# Patient Record
Sex: Male | Born: 1940
Health system: Southern US, Community
[De-identification: ages and names within clinical notes are randomized; demographics above are authoritative.]

## PROBLEM LIST (undated history)

## (undated) DIAGNOSIS — R011 Cardiac murmur, unspecified: Secondary | ICD-10-CM

## (undated) DIAGNOSIS — K219 Gastro-esophageal reflux disease without esophagitis: Secondary | ICD-10-CM

## (undated) DIAGNOSIS — K5790 Diverticulosis of intestine, part unspecified, without perforation or abscess without bleeding: Secondary | ICD-10-CM

## (undated) DIAGNOSIS — Z8489 Family history of other specified conditions: Secondary | ICD-10-CM

## (undated) DIAGNOSIS — Z87442 Personal history of urinary calculi: Secondary | ICD-10-CM

## (undated) DIAGNOSIS — J449 Chronic obstructive pulmonary disease, unspecified: Secondary | ICD-10-CM

## (undated) DIAGNOSIS — E785 Hyperlipidemia, unspecified: Secondary | ICD-10-CM

## (undated) DIAGNOSIS — I700 Atherosclerosis of aorta: Secondary | ICD-10-CM

## (undated) DIAGNOSIS — I70219 Atherosclerosis of native arteries of extremities with intermittent claudication, unspecified extremity: Secondary | ICD-10-CM

## (undated) DIAGNOSIS — I82409 Acute embolism and thrombosis of unspecified deep veins of unspecified lower extremity: Secondary | ICD-10-CM

## (undated) DIAGNOSIS — C44310 Basal cell carcinoma of skin of unspecified parts of face: Secondary | ICD-10-CM

## (undated) DIAGNOSIS — C4491 Basal cell carcinoma of skin, unspecified: Secondary | ICD-10-CM

## (undated) DIAGNOSIS — I4891 Unspecified atrial fibrillation: Secondary | ICD-10-CM

## (undated) DIAGNOSIS — M1990 Unspecified osteoarthritis, unspecified site: Secondary | ICD-10-CM

## (undated) DIAGNOSIS — N400 Enlarged prostate without lower urinary tract symptoms: Secondary | ICD-10-CM

## (undated) DIAGNOSIS — H409 Unspecified glaucoma: Secondary | ICD-10-CM

## (undated) DIAGNOSIS — I739 Peripheral vascular disease, unspecified: Secondary | ICD-10-CM

## (undated) DIAGNOSIS — I6529 Occlusion and stenosis of unspecified carotid artery: Secondary | ICD-10-CM

## (undated) DIAGNOSIS — Z8679 Personal history of other diseases of the circulatory system: Secondary | ICD-10-CM

## (undated) DIAGNOSIS — I2510 Atherosclerotic heart disease of native coronary artery without angina pectoris: Secondary | ICD-10-CM

## (undated) DIAGNOSIS — I499 Cardiac arrhythmia, unspecified: Secondary | ICD-10-CM

## (undated) DIAGNOSIS — N183 Chronic kidney disease, stage 3 unspecified: Secondary | ICD-10-CM

## (undated) HISTORY — PX: EYE SURGERY: SHX253

## (undated) HISTORY — PX: ABDOMINAL AORTIC ANEURYSM REPAIR: SUR1152

## (undated) HISTORY — PX: TONSILLECTOMY: SUR1361

## (undated) HISTORY — PX: VITRECTOMY: SHX106

## (undated) HISTORY — DX: Hyperlipidemia, unspecified: E78.5

## (undated) HISTORY — PX: ROTATOR CUFF REPAIR: SHX139

---

## 1983-07-22 DIAGNOSIS — I219 Acute myocardial infarction, unspecified: Secondary | ICD-10-CM

## 1983-07-22 HISTORY — DX: Acute myocardial infarction, unspecified: I21.9

## 1983-08-14 DIAGNOSIS — I2510 Atherosclerotic heart disease of native coronary artery without angina pectoris: Secondary | ICD-10-CM

## 1983-08-14 HISTORY — PX: CARDIAC CATHETERIZATION: SHX172

## 1983-08-14 HISTORY — PX: TEMPORARY PACEMAKER: CATH118268

## 1983-08-14 HISTORY — PX: CORONARY ANGIOPLASTY: SHX604

## 1983-08-14 HISTORY — DX: Atherosclerotic heart disease of native coronary artery without angina pectoris: I25.10

## 2001-02-11 ENCOUNTER — Encounter: Payer: Self-pay | Admitting: Family Medicine

## 2001-02-11 ENCOUNTER — Encounter: Admission: RE | Admit: 2001-02-11 | Discharge: 2001-02-11 | Payer: Self-pay | Admitting: Family Medicine

## 2002-10-20 ENCOUNTER — Encounter: Admission: RE | Admit: 2002-10-20 | Discharge: 2002-10-20 | Payer: Self-pay | Admitting: Family Medicine

## 2002-10-20 ENCOUNTER — Encounter: Payer: Self-pay | Admitting: Family Medicine

## 2003-01-11 ENCOUNTER — Encounter: Payer: Self-pay | Admitting: Family Medicine

## 2003-01-11 ENCOUNTER — Encounter: Admission: RE | Admit: 2003-01-11 | Discharge: 2003-01-11 | Payer: Self-pay | Admitting: Family Medicine

## 2006-05-21 DIAGNOSIS — I6529 Occlusion and stenosis of unspecified carotid artery: Secondary | ICD-10-CM

## 2006-05-21 HISTORY — DX: Occlusion and stenosis of unspecified carotid artery: I65.29

## 2006-05-21 HISTORY — PX: OTHER SURGICAL HISTORY: SHX169

## 2007-08-26 ENCOUNTER — Encounter: Admission: RE | Admit: 2007-08-26 | Discharge: 2007-08-26 | Payer: Self-pay | Admitting: Occupational Medicine

## 2011-01-23 HISTORY — PX: CARDIOVASCULAR STRESS TEST: SHX262

## 2012-07-01 ENCOUNTER — Other Ambulatory Visit (HOSPITAL_COMMUNITY): Payer: Self-pay | Admitting: Cardiology

## 2012-07-01 DIAGNOSIS — R011 Cardiac murmur, unspecified: Secondary | ICD-10-CM

## 2012-07-01 DIAGNOSIS — R0989 Other specified symptoms and signs involving the circulatory and respiratory systems: Secondary | ICD-10-CM

## 2012-07-21 ENCOUNTER — Ambulatory Visit (HOSPITAL_COMMUNITY)
Admission: RE | Admit: 2012-07-21 | Discharge: 2012-07-21 | Disposition: A | Discharge status: Home / Self Care | Payer: Medicare HMO | Source: Ambulatory Visit | Attending: Cardiology | Admitting: Cardiology

## 2012-07-21 DIAGNOSIS — R011 Cardiac murmur, unspecified: Secondary | ICD-10-CM | POA: Insufficient documentation

## 2012-07-21 DIAGNOSIS — R0989 Other specified symptoms and signs involving the circulatory and respiratory systems: Secondary | ICD-10-CM | POA: Insufficient documentation

## 2012-07-21 DIAGNOSIS — I350 Nonrheumatic aortic (valve) stenosis: Secondary | ICD-10-CM

## 2012-07-21 HISTORY — PX: DOPPLER ECHOCARDIOGRAPHY: SHX263

## 2012-07-21 HISTORY — DX: Nonrheumatic aortic (valve) stenosis: I35.0

## 2012-07-21 NOTE — Progress Notes (Signed)
2D Echo Performed 07/21/2012    Garrett Moore, RCS  

## 2012-07-25 NOTE — Progress Notes (Signed)
Carotid Duplex Completed. 

## 2012-08-09 ENCOUNTER — Telehealth: Payer: Self-pay | Admitting: *Deleted

## 2012-08-09 NOTE — Telephone Encounter (Signed)
Left message to call back  In regards to Echo per Dr Efrain Sella Aortic stenosis causing the murmur

## 2012-08-16 NOTE — Telephone Encounter (Signed)
Spoke to patient. Result given 

## 2014-08-10 DIAGNOSIS — H524 Presbyopia: Secondary | ICD-10-CM | POA: Diagnosis not present

## 2014-08-10 DIAGNOSIS — H40013 Open angle with borderline findings, low risk, bilateral: Secondary | ICD-10-CM | POA: Diagnosis not present

## 2014-10-25 ENCOUNTER — Encounter: Payer: Self-pay | Admitting: Cardiology

## 2014-12-13 DIAGNOSIS — Z79899 Other long term (current) drug therapy: Secondary | ICD-10-CM | POA: Diagnosis not present

## 2014-12-13 DIAGNOSIS — E782 Mixed hyperlipidemia: Secondary | ICD-10-CM | POA: Diagnosis not present

## 2014-12-13 DIAGNOSIS — I2510 Atherosclerotic heart disease of native coronary artery without angina pectoris: Secondary | ICD-10-CM | POA: Diagnosis not present

## 2014-12-17 DIAGNOSIS — Z0000 Encounter for general adult medical examination without abnormal findings: Secondary | ICD-10-CM | POA: Diagnosis not present

## 2015-01-11 DIAGNOSIS — L820 Inflamed seborrheic keratosis: Secondary | ICD-10-CM | POA: Diagnosis not present

## 2015-05-19 DIAGNOSIS — R05 Cough: Secondary | ICD-10-CM | POA: Diagnosis not present

## 2015-11-14 DIAGNOSIS — H40013 Open angle with borderline findings, low risk, bilateral: Secondary | ICD-10-CM | POA: Diagnosis not present

## 2015-11-14 DIAGNOSIS — H02833 Dermatochalasis of right eye, unspecified eyelid: Secondary | ICD-10-CM | POA: Diagnosis not present

## 2015-11-14 DIAGNOSIS — H31013 Macula scars of posterior pole (postinflammatory) (post-traumatic), bilateral: Secondary | ICD-10-CM | POA: Diagnosis not present

## 2015-11-14 DIAGNOSIS — H1851 Endothelial corneal dystrophy: Secondary | ICD-10-CM | POA: Diagnosis not present

## 2015-12-26 DIAGNOSIS — E782 Mixed hyperlipidemia: Secondary | ICD-10-CM | POA: Diagnosis not present

## 2015-12-26 DIAGNOSIS — Z79899 Other long term (current) drug therapy: Secondary | ICD-10-CM | POA: Diagnosis not present

## 2015-12-26 DIAGNOSIS — Z0000 Encounter for general adult medical examination without abnormal findings: Secondary | ICD-10-CM | POA: Diagnosis not present

## 2016-05-28 DIAGNOSIS — H40013 Open angle with borderline findings, low risk, bilateral: Secondary | ICD-10-CM | POA: Diagnosis not present

## 2016-05-28 DIAGNOSIS — H524 Presbyopia: Secondary | ICD-10-CM | POA: Diagnosis not present

## 2016-05-29 DIAGNOSIS — H5203 Hypermetropia, bilateral: Secondary | ICD-10-CM | POA: Diagnosis not present

## 2016-05-29 DIAGNOSIS — H5213 Myopia, bilateral: Secondary | ICD-10-CM | POA: Diagnosis not present

## 2016-05-29 DIAGNOSIS — H52209 Unspecified astigmatism, unspecified eye: Secondary | ICD-10-CM | POA: Diagnosis not present

## 2016-07-20 DIAGNOSIS — H532 Diplopia: Secondary | ICD-10-CM | POA: Diagnosis not present

## 2016-07-20 DIAGNOSIS — H43392 Other vitreous opacities, left eye: Secondary | ICD-10-CM | POA: Diagnosis not present

## 2016-07-20 DIAGNOSIS — T8522XA Displacement of intraocular lens, initial encounter: Secondary | ICD-10-CM | POA: Diagnosis not present

## 2016-08-12 DIAGNOSIS — Z9861 Coronary angioplasty status: Secondary | ICD-10-CM | POA: Diagnosis not present

## 2016-08-12 DIAGNOSIS — Z9842 Cataract extraction status, left eye: Secondary | ICD-10-CM | POA: Diagnosis not present

## 2016-08-12 DIAGNOSIS — Z7982 Long term (current) use of aspirin: Secondary | ICD-10-CM | POA: Diagnosis not present

## 2016-08-12 DIAGNOSIS — I2510 Atherosclerotic heart disease of native coronary artery without angina pectoris: Secondary | ICD-10-CM | POA: Diagnosis not present

## 2016-08-12 DIAGNOSIS — Z9841 Cataract extraction status, right eye: Secondary | ICD-10-CM | POA: Diagnosis not present

## 2016-08-12 DIAGNOSIS — T8522XA Displacement of intraocular lens, initial encounter: Secondary | ICD-10-CM | POA: Diagnosis not present

## 2016-08-12 DIAGNOSIS — E7800 Pure hypercholesterolemia, unspecified: Secondary | ICD-10-CM | POA: Diagnosis not present

## 2016-08-12 DIAGNOSIS — T8522XS Displacement of intraocular lens, sequela: Secondary | ICD-10-CM | POA: Diagnosis not present

## 2016-08-12 DIAGNOSIS — Z961 Presence of intraocular lens: Secondary | ICD-10-CM | POA: Diagnosis not present

## 2016-08-12 DIAGNOSIS — T8522XD Displacement of intraocular lens, subsequent encounter: Secondary | ICD-10-CM | POA: Diagnosis not present

## 2016-08-12 DIAGNOSIS — I252 Old myocardial infarction: Secondary | ICD-10-CM | POA: Diagnosis not present

## 2016-12-11 DIAGNOSIS — H338 Other retinal detachments: Secondary | ICD-10-CM | POA: Diagnosis not present

## 2016-12-21 DIAGNOSIS — H31092 Other chorioretinal scars, left eye: Secondary | ICD-10-CM | POA: Diagnosis not present

## 2016-12-21 DIAGNOSIS — H35032 Hypertensive retinopathy, left eye: Secondary | ICD-10-CM | POA: Diagnosis not present

## 2017-01-29 DIAGNOSIS — E782 Mixed hyperlipidemia: Secondary | ICD-10-CM | POA: Diagnosis not present

## 2017-01-29 DIAGNOSIS — C4491 Basal cell carcinoma of skin, unspecified: Secondary | ICD-10-CM | POA: Diagnosis not present

## 2017-01-29 DIAGNOSIS — Z0000 Encounter for general adult medical examination without abnormal findings: Secondary | ICD-10-CM | POA: Diagnosis not present

## 2017-01-29 DIAGNOSIS — Z79899 Other long term (current) drug therapy: Secondary | ICD-10-CM | POA: Diagnosis not present

## 2017-02-10 DIAGNOSIS — C44319 Basal cell carcinoma of skin of other parts of face: Secondary | ICD-10-CM | POA: Diagnosis not present

## 2017-04-28 DIAGNOSIS — C44319 Basal cell carcinoma of skin of other parts of face: Secondary | ICD-10-CM | POA: Diagnosis not present

## 2017-11-12 DIAGNOSIS — M25552 Pain in left hip: Secondary | ICD-10-CM | POA: Diagnosis not present

## 2017-11-15 DIAGNOSIS — N183 Chronic kidney disease, stage 3 (moderate): Secondary | ICD-10-CM | POA: Diagnosis not present

## 2017-11-15 DIAGNOSIS — I2510 Atherosclerotic heart disease of native coronary artery without angina pectoris: Secondary | ICD-10-CM | POA: Diagnosis not present

## 2017-11-25 DIAGNOSIS — M25552 Pain in left hip: Secondary | ICD-10-CM | POA: Diagnosis not present

## 2017-11-25 DIAGNOSIS — M7062 Trochanteric bursitis, left hip: Secondary | ICD-10-CM | POA: Diagnosis not present

## 2018-01-21 DIAGNOSIS — I5189 Other ill-defined heart diseases: Secondary | ICD-10-CM

## 2018-01-21 HISTORY — DX: Other ill-defined heart diseases: I51.89

## 2018-02-01 DIAGNOSIS — H524 Presbyopia: Secondary | ICD-10-CM | POA: Diagnosis not present

## 2018-02-02 DIAGNOSIS — H5213 Myopia, bilateral: Secondary | ICD-10-CM | POA: Diagnosis not present

## 2018-02-02 DIAGNOSIS — H52209 Unspecified astigmatism, unspecified eye: Secondary | ICD-10-CM | POA: Diagnosis not present

## 2018-02-04 DIAGNOSIS — R011 Cardiac murmur, unspecified: Secondary | ICD-10-CM | POA: Diagnosis not present

## 2018-02-04 DIAGNOSIS — J069 Acute upper respiratory infection, unspecified: Secondary | ICD-10-CM | POA: Diagnosis not present

## 2018-02-07 ENCOUNTER — Other Ambulatory Visit (HOSPITAL_COMMUNITY): Payer: Self-pay | Admitting: Family Medicine

## 2018-02-07 DIAGNOSIS — I359 Nonrheumatic aortic valve disorder, unspecified: Secondary | ICD-10-CM

## 2018-02-08 ENCOUNTER — Other Ambulatory Visit: Payer: Self-pay

## 2018-02-08 ENCOUNTER — Ambulatory Visit (HOSPITAL_COMMUNITY): Payer: Medicare HMO | Attending: Cardiology

## 2018-02-08 DIAGNOSIS — I5189 Other ill-defined heart diseases: Secondary | ICD-10-CM

## 2018-02-08 DIAGNOSIS — I359 Nonrheumatic aortic valve disorder, unspecified: Secondary | ICD-10-CM | POA: Diagnosis not present

## 2018-02-08 DIAGNOSIS — I7143 Infrarenal abdominal aortic aneurysm, without rupture: Secondary | ICD-10-CM

## 2018-02-08 DIAGNOSIS — I350 Nonrheumatic aortic (valve) stenosis: Secondary | ICD-10-CM

## 2018-02-08 HISTORY — DX: Other ill-defined heart diseases: I51.89

## 2018-02-08 HISTORY — DX: Nonrheumatic aortic (valve) stenosis: I35.0

## 2018-02-08 HISTORY — DX: Infrarenal abdominal aortic aneurysm, without rupture: I71.43

## 2018-02-21 DIAGNOSIS — N183 Chronic kidney disease, stage 3 (moderate): Secondary | ICD-10-CM | POA: Diagnosis not present

## 2018-02-21 DIAGNOSIS — E782 Mixed hyperlipidemia: Secondary | ICD-10-CM | POA: Diagnosis not present

## 2018-02-21 DIAGNOSIS — Z79899 Other long term (current) drug therapy: Secondary | ICD-10-CM | POA: Diagnosis not present

## 2018-02-24 DIAGNOSIS — Z0000 Encounter for general adult medical examination without abnormal findings: Secondary | ICD-10-CM | POA: Diagnosis not present

## 2018-02-24 DIAGNOSIS — Z79899 Other long term (current) drug therapy: Secondary | ICD-10-CM | POA: Diagnosis not present

## 2018-02-24 DIAGNOSIS — N183 Chronic kidney disease, stage 3 (moderate): Secondary | ICD-10-CM | POA: Diagnosis not present

## 2018-02-24 DIAGNOSIS — I2510 Atherosclerotic heart disease of native coronary artery without angina pectoris: Secondary | ICD-10-CM | POA: Diagnosis not present

## 2018-02-24 DIAGNOSIS — R7303 Prediabetes: Secondary | ICD-10-CM | POA: Diagnosis not present

## 2018-02-24 DIAGNOSIS — E782 Mixed hyperlipidemia: Secondary | ICD-10-CM | POA: Diagnosis not present

## 2018-05-24 DIAGNOSIS — R159 Full incontinence of feces: Secondary | ICD-10-CM | POA: Diagnosis not present

## 2018-11-02 DIAGNOSIS — M79671 Pain in right foot: Secondary | ICD-10-CM | POA: Diagnosis not present

## 2018-11-02 DIAGNOSIS — M21621 Bunionette of right foot: Secondary | ICD-10-CM | POA: Diagnosis not present

## 2018-12-02 DIAGNOSIS — M7062 Trochanteric bursitis, left hip: Secondary | ICD-10-CM | POA: Diagnosis not present

## 2018-12-02 DIAGNOSIS — M1612 Unilateral primary osteoarthritis, left hip: Secondary | ICD-10-CM | POA: Diagnosis not present

## 2018-12-02 DIAGNOSIS — M1611 Unilateral primary osteoarthritis, right hip: Secondary | ICD-10-CM | POA: Diagnosis not present

## 2018-12-02 DIAGNOSIS — M25552 Pain in left hip: Secondary | ICD-10-CM | POA: Diagnosis not present

## 2019-01-05 DIAGNOSIS — H27111 Subluxation of lens, right eye: Secondary | ICD-10-CM | POA: Diagnosis not present

## 2019-01-05 DIAGNOSIS — H532 Diplopia: Secondary | ICD-10-CM | POA: Diagnosis not present

## 2019-01-05 DIAGNOSIS — H35341 Macular cyst, hole, or pseudohole, right eye: Secondary | ICD-10-CM | POA: Diagnosis not present

## 2019-01-06 DIAGNOSIS — T8522XA Displacement of intraocular lens, initial encounter: Secondary | ICD-10-CM | POA: Diagnosis not present

## 2019-01-06 DIAGNOSIS — Z8669 Personal history of other diseases of the nervous system and sense organs: Secondary | ICD-10-CM | POA: Diagnosis not present

## 2019-01-22 HISTORY — PX: CATARACT EXTRACTION, BILATERAL: SHX1313

## 2019-01-25 DIAGNOSIS — T8522XA Displacement of intraocular lens, initial encounter: Secondary | ICD-10-CM | POA: Diagnosis not present

## 2019-01-25 DIAGNOSIS — H2701 Aphakia, right eye: Secondary | ICD-10-CM | POA: Diagnosis not present

## 2019-02-03 NOTE — H&P (Signed)
TOTAL HIP ADMISSION H&P  Patient is admitted for left total hip arthroplasty, anterior approach.  Subjective:  Chief Complaint:   Left hip primary OA / pain  HPI: Garrett Moore, 78 y.o. male, has a history of pain and functional disability in the left hip(s) due to arthritis and patient has failed non-surgical conservative treatments for greater than 12 weeks to include NSAID's and/or analgesics, corticosteriod injections and activity modification.  Onset of symptoms was gradual starting 2-3 years ago with gradually worsening course since that time.The patient noted no past surgery on the left hip(s).  Patient currently rates pain in the left hip at 8 out of 10 with activity. Patient has worsening of pain with activity and weight bearing, trendelenberg gait, pain that interfers with activities of daily living and pain with passive range of motion. Patient has evidence of periarticular osteophytes and joint space narrowing by imaging studies. This condition presents safety issues increasing the risk of falls.  There is no current active infection.  Risks, benefits and expectations were discussed with the patient.  Risks including but not limited to the risk of anesthesia, blood clots, nerve damage, blood vessel damage, failure of the prosthesis, infection and up to and including death.  Patient understand the risks, benefits and expectations and wishes to proceed with surgery.   PCP: [name], MD  D/C Plans:       Home   Post-op Meds:       No Rx given  Tranexamic Acid:      To be given - IV   Decadron:      Is to be given  FYI:      Xarelto then ASA (previous DVT)  No Celebrex (CKD)  Norco  DME:   Pt equipment Rxs Sent for RW and 3-n-1  PT:   HEP  Pharmacy: [facility]     Past Medical History:  Diagnosis Date  . Aortic valvar stenosis 02/08/2018   Mild to Moderate Noted on ECHO  . Basal cell carcinoma    Face  . CKD (chronic kidney disease), stage III    . COPD (chronic obstructive pulmonary disease) ([place])   . Coronary artery disease   . DVT (deep venous thrombosis) ([place])    age 42, leg after Tonsillectomy  . Grade I diastolic dysfunction [id number]   Noted on ECHO  . Heart murmur   . Hyperlipidemia   . Myocardial infarction ([place]) 07/1983  . OA (osteoarthritis)     Past Surgical History:  Procedure Laterality Date  . CARDIAC CATHETERIZATION  08/14/1983  . CARDIOVASCULAR STRESS TEST  01/23/2011   normal myocardial perfusion scan demonstrating an attenuation artifact in the inferior region of the myocardium. no ischemia or infarct/scar seen in remaining myocardium  . CAROTID DUPLEX  05/21/2006   right bulb-0-49% diameter reduction; left bulb and proximal ICA-0-49% diameter reduction  . CATARACT EXTRACTION, BILATERAL  01/2019  . CORONARY ANGIOPLASTY  07/1983   PTCA Prox RCA  . DOPPLER ECHOCARDIOGRAPHY  07/21/2012   EF 50-55%, aortic valve noncoronary cusp mobility was severely restricted.  Marland Kitchen ROTATOR CUFF REPAIR Left   . TEMPORARY PACEMAKER  07/1983  . TONSILLECTOMY      No current facility-administered medications for this encounter.    Current Outpatient Medications  Medication Sig Dispense Refill Last Dose  . aspirin 81 MG tablet Take 81 mg by mouth at bedtime.      . Multiple Vitamin (MULTIVITAMIN) tablet Take 1 tablet by mouth at bedtime.      Marland Kitchen  nicotine polacrilex (NICORETTE) 4 MG gum Take 2 mg by mouth daily.     . prednisoLONE acetate (PRED FORTE) 1 % ophthalmic suspension Place 1 drop into the right eye 2 (two) times daily.      . simvastatin (ZOCOR) 80 MG tablet Take 80 mg by mouth daily at 6 PM.      . acetaminophen (TYLENOL) 500 MG tablet Take 500 mg by mouth every 6 (six) hours as needed.      No Known Allergies   Social History   Tobacco Use  . Smoking status: Former Smoker    Packs/day: 2.00    Years: 50.00    Pack years: 100.00    Types: Cigarettes  . Smokeless tobacco: Never Used  . Tobacco comment: quit 14  years ago  Substance Use Topics  . Alcohol use: Not Currently    Family History  Problem Relation Age of Onset  . Heart Problems Mother   . AAA (abdominal aortic aneurysm) Mother      Review of Systems  Constitutional: Negative.   HENT: Negative.   Eyes: Negative.   Respiratory: Negative.   Cardiovascular: Negative.   Gastrointestinal: Negative.   Genitourinary: Negative.   Musculoskeletal: Positive for joint pain.  Skin: Negative.   Neurological: Negative.   Endo/Heme/Allergies: Negative.   Psychiatric/Behavioral: Negative.     Objective:  Physical Exam  Constitutional: He is oriented to person, place, and time. He appears well-developed.  HENT:  Head: Normocephalic.  Eyes: Pupils are equal, round, and reactive to light.  Neck: Neck supple. No JVD present. No tracheal deviation present. No thyromegaly present.  Cardiovascular: Normal rate, regular rhythm and intact distal pulses.  Murmur heard. Respiratory: Effort normal and breath sounds normal. No respiratory distress. He has no wheezes.  GI: Soft. There is no abdominal tenderness. There is no guarding.  Musculoskeletal:     Left hip: He exhibits decreased range of motion, decreased strength, tenderness and bony tenderness. He exhibits no swelling, no deformity and no laceration.  Lymphadenopathy:    He has no cervical adenopathy.  Neurological: He is alert and oriented to person, place, and time.  Skin: Skin is warm and dry.  Psychiatric: He has a normal mood and affect.      Imaging Review Plain radiographs demonstrate severe degenerative joint disease of the left hip. The bone quality appears to be good for age and reported activity level.      Assessment/Plan:  End stage arthritis, left hip  The patient history, physical examination, clinical judgement of the provider and imaging studies are consistent with end stage degenerative joint disease of the left hip and total hip arthroplasty is deemed  medically necessary. The treatment options including medical management, injection therapy, arthroscopy and arthroplasty were discussed at length. The risks and benefits of total hip arthroplasty were presented and reviewed. The risks due to aseptic loosening, infection, stiffness, dislocation/subluxation,  thromboembolic complications and other imponderables were discussed.  The patient acknowledged the explanation, agreed to proceed with the plan and consent was signed. Patient is being admitted for inpatient treatment for surgery, pain control, PT, OT, prophylactic antibiotics, VTE prophylaxis, progressive ambulation and ADL's and discharge planning.The patient is planning to be discharged home.    [place] Pugh Derriona Branscom   PA-C  02/20/2019, 8:08 PM

## 2019-02-14 ENCOUNTER — Encounter (HOSPITAL_COMMUNITY): Payer: Self-pay

## 2019-02-14 NOTE — Patient Instructions (Addendum)
DUE TO COVID-19 ONLY ONE VISITOR IS ALLOWED TO COME WITH YOU AND STAY IN THE WAITING ROOM ONLY DURING PRE OP AND PROCEDURE. THE ONE VISITOR MAY VISIT WITH YOU IN YOUR PRIVATE ROOM DURING VISITING HOURS ONLY!!   COVID SWAB TESTING MUST BE COMPLETED ON: Friday, Feb 17, 2019 at 10:35 AM [address], [place] -[facility] enter pre surgical testing line (Must self quarantine after testing. Follow instructions on handout.)         Your procedure is scheduled on: Tuesday, Dec. 1, 2020   Report to [facility] Main  Entrance    Report to admitting at 9:00 AM   Call this number if you have problems the morning of surgery [phone]   Do not eat food:After Midnight.   May have liquids until 8:30 AM day of surgery   CLEAR LIQUID DIET  Foods Allowed                                                                     Foods Excluded  Water, Black Coffee and tea, regular and decaf                             liquids that you cannot  Plain Jell-O in any flavor  (No red)                                           see through such as: Fruit ices (not with fruit pulp)                                     milk, soups, orange juice  Iced Popsicles (No red)                                    All solid food Carbonated beverages, regular and diet                                    Apple juices Sports drinks like Gatorade (No red) Lightly seasoned clear broth or consume(fat free) Sugar, honey syrup  Sample Menu Breakfast                                Lunch                                     Supper Cranberry juice                    Beef broth                            Chicken broth Jell-O  Grape juice                           Apple juice Coffee or tea                        Jell-O                                      Popsicle                                                Coffee or tea                        Coffee or  tea   Complete one Ensure drink the morning of surgery at 8:30 AM the day of surgery.   Brush your teeth the morning of surgery.   Do NOT smoke after Midnight   Take these medicines the morning of surgery with A SIP OF WATER: None   May use eye drops morning of surgery                               You may not have any metal on your body including jewelry, and body piercings             Do not wear lotions, powders, perfumes/cologne, or deodorant                          Men may shave face and neck.   Do not bring valuables to the hospital. Garrett Moore.   Contacts, dentures or bridgework may not be worn into surgery.   Bring small overnight bag day of surgery.    Patients discharged the day of surgery will not be allowed to drive home.   Special Instructions: Bring a copy of your healthcare power of attorney and living will documents         the day of surgery if you haven't scanned them in before.              Please read over the following fact sheets you were given:  [facility] - Preparing for Surgery Before surgery, you can play an important role.  Because skin is not sterile, your skin needs to be as free of germs as possible.  You can reduce the number of germs on your skin by washing with CHG (chlorahexidine gluconate) soap before surgery.  CHG is an antiseptic cleaner which kills germs and bonds with the skin to continue killing germs even after washing. Please DO NOT use if you have an allergy to CHG or antibacterial soaps.  If your skin becomes reddened/irritated stop using the CHG and inform your nurse when you arrive at Short Stay. Do not shave (including legs and underarms) for at least 48 hours prior to the first CHG shower.  You may shave your face/neck.  Please follow these instructions carefully:  1.  Shower with CHG Soap the night before surgery and the  morning of surgery.  2.  If you choose to wash your hair,  wash your hair first as usual with your normal  shampoo.  3.  After you shampoo, rinse your hair and body thoroughly to remove the shampoo.                             4.  Use CHG as you would any other liquid soap.  You can apply chg directly to the skin and wash.  Gently with a scrungie or clean washcloth.  5.  Apply the CHG Soap to your body ONLY FROM THE NECK DOWN.   Do   not use on face/ open                           Wound or open sores. Avoid contact with eyes, ears mouth and   genitals (private parts).                       Wash face,  Genitals (private parts) with your normal soap.             6.  Wash thoroughly, paying special attention to the area where your    surgery  will be performed.  7.  Thoroughly rinse your body with warm water from the neck down.  8.  DO NOT shower/wash with your normal soap after using and rinsing off the CHG Soap.                9.  Pat yourself dry with a clean towel.            10.  Wear clean pajamas.            11.  Place clean sheets on your bed the night of your first shower and do not  sleep with pets. Day of Surgery : Do not apply any lotions/deodorants the morning of surgery.  Please wear clean clothes to the hospital/surgery center.  FAILURE TO FOLLOW THESE INSTRUCTIONS MAY RESULT IN THE CANCELLATION OF YOUR SURGERY  PATIENT SIGNATURE_________________________________  NURSE SIGNATURE__________________________________  ________________________________________________________________________   Garrett Moore  An incentive spirometer is a tool that can help keep your lungs clear and active. This tool measures how well you are filling your lungs with each breath. Taking long deep breaths may help reverse or decrease the chance of developing breathing (pulmonary) problems (especially infection) following:  A long period of time when you are unable to move or be active. BEFORE THE PROCEDURE   If the spirometer includes an indicator to  show your best effort, your nurse or respiratory therapist will set it to a desired goal.  If possible, sit up straight or lean slightly forward. Try not to slouch.  Hold the incentive spirometer in an upright position. INSTRUCTIONS FOR USE  1. Sit on the edge of your bed if possible, or sit up as far as you can in bed or on a chair. 2. Hold the incentive spirometer in an upright position. 3. Breathe out normally. 4. Place the mouthpiece in your mouth and seal your lips tightly around it. 5. Breathe in slowly and as deeply as possible, raising the piston or the ball toward the top of the column. 6. Hold your breath for 3-5 seconds or for as long as possible. Allow the piston or ball to fall to the bottom of the column. 7.  Remove the mouthpiece from your mouth and breathe out normally. 8. Rest for a few seconds and repeat Steps 1 through 7 at least 10 times every 1-2 hours when you are awake. Take your time and take a few normal breaths between deep breaths. 9. The spirometer may include an indicator to show your best effort. Use the indicator as a goal to work toward during each repetition. 10. After each set of 10 deep breaths, practice coughing to be sure your lungs are clear. If you have an incision (the cut made at the time of surgery), support your incision when coughing by placing a pillow or rolled up towels firmly against it. Once you are able to get out of bed, walk around indoors and cough well. You may stop using the incentive spirometer when instructed by your caregiver.  RISKS AND COMPLICATIONS  Take your time so you do not get dizzy or light-headed.  If you are in pain, you may need to take or ask for pain medication before doing incentive spirometry. It is harder to take a deep breath if you are having pain. AFTER USE  Rest and breathe slowly and easily.  It can be helpful to keep track of a log of your progress. Your caregiver can provide you with a simple table to help with  this. If you are using the spirometer at home, follow these instructions: [place] IF:   You are having difficultly using the spirometer.  You have trouble using the spirometer as often as instructed.  Your pain medication is not giving enough relief while using the spirometer.  You develop fever of 100.5 F (38.1 C) or higher. SEEK IMMEDIATE MEDICAL CARE IF:   You cough up bloody sputum that had not been present before.  You develop fever of 102 F (38.9 C) or greater.  You develop worsening pain at or near the incision site. MAKE SURE YOU:   Understand these instructions.  Will watch your condition.  Will get help right away if you are not doing well or get worse. Document Released: 07/20/2006 Document Revised: 06/01/2011 Document Reviewed: 09/20/2006 ExitCare Patient Information 2014 ExitCare, [place].   ________________________________________________________________________  WHAT IS A BLOOD TRANSFUSION? Blood Transfusion Information  A transfusion is the replacement of blood or some of its parts. Blood is made up of multiple cells which provide different functions.  Red blood cells carry oxygen and are used for blood loss replacement.  White blood cells fight against infection.  Platelets control bleeding.  Plasma helps clot blood.  Other blood products are available for specialized needs, such as hemophilia or other clotting disorders. BEFORE THE TRANSFUSION  Who gives blood for transfusions?   Healthy volunteers who are fully evaluated to make sure their blood is safe. This is blood bank blood. Transfusion therapy is the safest it has ever been in the practice of medicine. Before blood is taken from a donor, a complete history is taken to make sure that person has no history of diseases nor engages in risky social behavior (examples are intravenous drug use or sexual activity with multiple partners). The donor's travel history is screened to minimize  risk of transmitting infections, such as malaria. The donated blood is tested for signs of infectious diseases, such as HIV and hepatitis. The blood is then tested to be sure it is compatible with you in order to minimize the chance of a transfusion reaction. If you or a relative donates blood, this is often done in anticipation  of surgery and is not appropriate for emergency situations. It takes many days to process the donated blood. RISKS AND COMPLICATIONS Although transfusion therapy is very safe and saves many lives, the main dangers of transfusion include:   Getting an infectious disease.  Developing a transfusion reaction. This is an allergic reaction to something in the blood you were given. Every precaution is taken to prevent this. The decision to have a blood transfusion has been considered carefully by your caregiver before blood is given. Blood is not given unless the benefits outweigh the risks. AFTER THE TRANSFUSION  Right after receiving a blood transfusion, you will usually feel much better and more energetic. This is especially true if your red blood cells have gotten low (anemic). The transfusion raises the level of the red blood cells which carry oxygen, and this usually causes an energy increase.  The nurse administering the transfusion will monitor you carefully for complications. HOME CARE INSTRUCTIONS  No special instructions are needed after a transfusion. You may find your energy is better. Speak with your caregiver about any limitations on activity for underlying diseases you may have. SEEK MEDICAL CARE IF:   Your condition is not improving after your transfusion.  You develop redness or irritation at the intravenous (IV) site. SEEK IMMEDIATE MEDICAL CARE IF:  Any of the following symptoms occur over the next 12 hours:  Shaking chills.  You have a temperature by mouth above 102 F (38.9 C), not controlled by medicine.  Chest, back, or muscle pain.  People  around you feel you are not acting correctly or are confused.  Shortness of breath or difficulty breathing.  Dizziness and fainting.  You get a rash or develop hives.  You have a decrease in urine output.  Your urine turns a dark color or changes to pink, red, or brown. Any of the following symptoms occur over the next 10 days:  You have a temperature by mouth above 102 F (38.9 C), not controlled by medicine.  Shortness of breath.  Weakness after normal activity.  The white part of the eye turns yellow (jaundice).  You have a decrease in the amount of urine or are urinating less often.  Your urine turns a dark color or changes to pink, red, or brown. Document Released: 03/06/2000 Document Revised: 06/01/2011 Document Reviewed: 10/24/2007 [facility] Patient Information 2014 [place], [place].  _______________________________________________________________________

## 2019-02-15 ENCOUNTER — Encounter (HOSPITAL_COMMUNITY)
Admission: RE | Admit: 2019-02-15 | Discharge: 2019-02-15 | Disposition: A | Discharge status: Home / Self Care | Payer: Medicare HMO | Source: Ambulatory Visit | Attending: Orthopedic Surgery | Admitting: Orthopedic Surgery

## 2019-02-15 ENCOUNTER — Other Ambulatory Visit: Payer: Self-pay

## 2019-02-15 ENCOUNTER — Encounter (HOSPITAL_COMMUNITY): Payer: Self-pay

## 2019-02-15 DIAGNOSIS — Z7982 Long term (current) use of aspirin: Secondary | ICD-10-CM | POA: Insufficient documentation

## 2019-02-15 DIAGNOSIS — N183 Chronic kidney disease, stage 3 unspecified: Secondary | ICD-10-CM | POA: Diagnosis not present

## 2019-02-15 DIAGNOSIS — R9431 Abnormal electrocardiogram [ECG] [EKG]: Secondary | ICD-10-CM | POA: Diagnosis not present

## 2019-02-15 DIAGNOSIS — Z87891 Personal history of nicotine dependence: Secondary | ICD-10-CM | POA: Insufficient documentation

## 2019-02-15 DIAGNOSIS — Z9861 Coronary angioplasty status: Secondary | ICD-10-CM | POA: Diagnosis not present

## 2019-02-15 DIAGNOSIS — I350 Nonrheumatic aortic (valve) stenosis: Secondary | ICD-10-CM | POA: Diagnosis not present

## 2019-02-15 DIAGNOSIS — Z01818 Encounter for other preprocedural examination: Secondary | ICD-10-CM | POA: Diagnosis not present

## 2019-02-15 DIAGNOSIS — Z86718 Personal history of other venous thrombosis and embolism: Secondary | ICD-10-CM | POA: Diagnosis not present

## 2019-02-15 DIAGNOSIS — Z7952 Long term (current) use of systemic steroids: Secondary | ICD-10-CM | POA: Insufficient documentation

## 2019-02-15 DIAGNOSIS — E785 Hyperlipidemia, unspecified: Secondary | ICD-10-CM | POA: Diagnosis not present

## 2019-02-15 DIAGNOSIS — M1612 Unilateral primary osteoarthritis, left hip: Secondary | ICD-10-CM | POA: Diagnosis not present

## 2019-02-15 DIAGNOSIS — Z79899 Other long term (current) drug therapy: Secondary | ICD-10-CM | POA: Diagnosis not present

## 2019-02-15 DIAGNOSIS — J449 Chronic obstructive pulmonary disease, unspecified: Secondary | ICD-10-CM | POA: Diagnosis not present

## 2019-02-15 DIAGNOSIS — I252 Old myocardial infarction: Secondary | ICD-10-CM | POA: Diagnosis not present

## 2019-02-15 DIAGNOSIS — I2510 Atherosclerotic heart disease of native coronary artery without angina pectoris: Secondary | ICD-10-CM | POA: Diagnosis not present

## 2019-02-15 HISTORY — DX: Cardiac murmur, unspecified: R01.1

## 2019-02-15 HISTORY — DX: Atherosclerotic heart disease of native coronary artery without angina pectoris: I25.10

## 2019-02-15 HISTORY — DX: Chronic obstructive pulmonary disease, unspecified: J44.9

## 2019-02-15 HISTORY — DX: Acute embolism and thrombosis of unspecified deep veins of unspecified lower extremity: I82.409

## 2019-02-15 HISTORY — DX: Basal cell carcinoma of skin, unspecified: C44.91

## 2019-02-15 HISTORY — DX: Chronic kidney disease, stage 3 unspecified: N18.30

## 2019-02-15 HISTORY — DX: Unspecified osteoarthritis, unspecified site: M19.90

## 2019-02-15 LAB — BASIC METABOLIC PANEL
Anion gap: 7 (ref 5–15)
BUN: 14 mg/dL (ref 8–23)
CO2: 25 mmol/L (ref 22–32)
Calcium: 9 mg/dL (ref 8.9–10.3)
Chloride: 106 mmol/L (ref 98–111)
Creatinine, Ser: 1.23 mg/dL (ref 0.61–1.24)
GFR calc Af Amer: >60 mL/min (ref >60)
GFR calc non Af Amer: 56 mL/min — ABNORMAL LOW (ref >60)
Glucose, Bld: 154 mg/dL — ABNORMAL HIGH (ref 70–99)
Potassium: 4.1 mmol/L (ref 3.5–5.1)
Sodium: 138 mmol/L (ref 135–145)

## 2019-02-15 LAB — CBC
HCT: 43.8 % (ref 39.0–52.0)
Hemoglobin: 13.8 g/dL (ref 13.0–17.0)
MCH: 31.1 pg (ref 26.0–34.0)
MCHC: 31.5 g/dL (ref 30.0–36.0)
MCV: 98.6 fL (ref 80.0–100.0)
Platelets: 273 10*3/uL (ref 150–400)
RBC: 4.44 MIL/uL (ref 4.22–5.81)
RDW: 12.8 % (ref 11.5–15.5)
WBC: 7.9 10*3/uL (ref 4.0–10.5)
nRBC: 0 % (ref 0.0–0.2)

## 2019-02-15 LAB — ABO/RH: ABO/RH(D): A POS

## 2019-02-15 LAB — SURGICAL PCR SCREEN
MRSA, PCR: NEGATIVE
Staphylococcus aureus: NEGATIVE

## 2019-02-15 NOTE — Progress Notes (Signed)
PCP - Dr. Lavina Hamman Cardiologist - N/A  Chest x-ray -N/A  EKG - 02/15/2019 in epic Stress Test - greater than 2 years ECHO - 02/08/2018 in epic Cardiac Cath - greater than 2 years  Sleep Study - N/A CPAP - N/A  Fasting Blood Sugar - N/A Checks Blood Sugar __N/A___ times a day  Blood Thinner Instructions:  N/A Aspirin Instructions: Yes Last Dose:  Last dose 02/14/2019  Anesthesia review: Aortic Valve Stenosis, ECHO 01/2018 no follow up noted  Patient denies shortness of breath, fever, cough and chest pain at PAT appointment   Patient verbalized understanding of instructions that were given to them at the PAT appointment. Patient was also instructed that they will need to review over the PAT instructions again at home before surgery.

## 2019-02-17 ENCOUNTER — Other Ambulatory Visit (HOSPITAL_COMMUNITY)
Admission: RE | Admit: 2019-02-17 | Discharge: 2019-02-17 | Disposition: A | Discharge status: Home / Self Care | Payer: Medicare HMO | Source: Ambulatory Visit | Attending: Orthopedic Surgery | Admitting: Orthopedic Surgery

## 2019-02-17 DIAGNOSIS — Z01812 Encounter for preprocedural laboratory examination: Secondary | ICD-10-CM | POA: Insufficient documentation

## 2019-02-17 DIAGNOSIS — Z20828 Contact with and (suspected) exposure to other viral communicable diseases: Secondary | ICD-10-CM | POA: Insufficient documentation

## 2019-02-17 LAB — SARS CORONAVIRUS 2 (TAT 6-24 HRS): SARS Coronavirus 2: NEGATIVE

## 2019-02-17 NOTE — Progress Notes (Addendum)
Anesthesia Chart Review   Case: [id number] Date/Time: 02/21/19 1115   Procedure: TOTAL HIP ARTHROPLASTY ANTERIOR APPROACH (Left Hip) - 70 mins   Anesthesia type: Spinal   Pre-op diagnosis: Left hip osteoarthritis   Location: [facility] ROOM 10 / [facility]   Surgeon: Paralee Cancel, MD      DISCUSSION:78 y.o. former smoker (100 pack years) with h/o HLD, COPD, CKD Stage III, CAD, MI 1985, mild to moderate AS, left hip OA scheduled for above procedure 02/21/2019 with Dr. Paralee Cancel.   Pt with previous MI 1985, mild AS seen on Echo 01/2018.  Mean gradient (S): 17 mm Hg. Valve area (VTI) 1.74 cm^2. Valve area (Vmax): 1.71 cm^2. Valve area (Vmean): 1.56 cm^2.  Normal stress test 2012.   Clearance received from PCP, Dr. Dwyane Luo, 01/19/2019 which states pt is low risk for planned procedure.   Discussed with Dr. Smith Robert. Anticipate pt can proceed with planned procedure barring acute status change.   VS: BP 137/63   Pulse 89   Temp 36.6 C (Oral)   Resp 16   Ht 6' (1.829 m)   Wt 89.4 kg   SpO2 95%   BMI 26.75 kg/m   PROVIDERS: [name], MD is PCP    LABS: Labs reviewed: Acceptable for surgery. (all labs ordered are listed, but only abnormal results are displayed)  Labs Reviewed  BASIC METABOLIC PANEL - Abnormal; Notable for the following components:      Result Value   Glucose, Bld 154 (*)    GFR calc non Af Amer 56 (*)    All other components within normal limits  SURGICAL PCR SCREEN  CBC  TYPE AND SCREEN  ABO/RH     IMAGES:   EKG: 02/15/2019 Rate 82 bpm Normal sinus rhythm  Nonspecific T wave abnormality   CV: Echo 02/08/2018 Study Conclusions  - Left ventricle: The cavity size was normal. Systolic function was   normal. The estimated ejection fraction was in the range of 60%   to 65%. Wall motion was normal; there were no regional wall   motion abnormalities. There was an increased relative   contribution of atrial contraction to ventricular filling.    Doppler parameters are consistent with abnormal left ventricular   relaxation (grade 1 diastolic dysfunction). - Aortic valve: There was mild to moderate stenosis. There was mild   regurgitation. Mean gradient (S): 17 mm Hg. Valve area (VTI):   1.74 cm^2. Valve area (Vmax): 1.71 cm^2. Valve area (Vmean): 1.56   cm^2. - Aorta: Aortic root dimension: 38 mm (ED). Ascending aortic   diameter: 40 mm (S). - Aortic root: The aortic root was mildly dilated. - Ascending aorta: The ascending aorta was mildly dilated. - Mitral valve: Calcified annulus. - Atrial septum: There was increased thickness of the septum,   consistent with lipomatous hypertrophy. - Tricuspid valve: There was trivial regurgitation.  Stress Test 01/23/2011 Normal myocardial perfusion scan demonstrating an attenuation artifct in the inferior region of the myocardium.  A non-transmural scar appears less likely.  No ischemia or infarct/scar is seen in the remaining myocardium.  This is a low risk scan.  Past Medical History:  Diagnosis Date  . Aortic valvar stenosis 02/08/2018   Mild to Moderate Noted on ECHO  . Basal cell carcinoma    Face  . CKD (chronic kidney disease), stage III   . COPD (chronic obstructive pulmonary disease) ([place])   . Coronary artery disease   . DVT (deep venous thrombosis) ([place])  age 77, leg after Tonsillectomy  . Grade I diastolic dysfunction [id number]   Noted on ECHO  . Heart murmur   . Hyperlipidemia   . Myocardial infarction ([place]) 07/1983  . OA (osteoarthritis)     Past Surgical History:  Procedure Laterality Date  . CARDIAC CATHETERIZATION  08/14/1983  . CARDIOVASCULAR STRESS TEST  01/23/2011   normal myocardial perfusion scan demonstrating an attenuation artifact in the inferior region of the myocardium. no ischemia or infarct/scar seen in remaining myocardium  . CAROTID DUPLEX  05/21/2006   right bulb-0-49% diameter reduction; left bulb and proximal ICA-0-49% diameter reduction  .  CATARACT EXTRACTION, BILATERAL  01/2019  . CORONARY ANGIOPLASTY  07/1983   PTCA Prox RCA  . DOPPLER ECHOCARDIOGRAPHY  07/21/2012   EF 50-55%, aortic valve noncoronary cusp mobility was severely restricted.  Marland Kitchen ROTATOR CUFF REPAIR Left   . TEMPORARY PACEMAKER  07/1983  . TONSILLECTOMY      MEDICATIONS: . acetaminophen (TYLENOL) 500 MG tablet  . aspirin 81 MG tablet  . Multiple Vitamin (MULTIVITAMIN) tablet  . nicotine polacrilex (NICORETTE) 4 MG gum  . prednisoLONE acetate (PRED FORTE) 1 % ophthalmic suspension  . simvastatin (ZOCOR) 80 MG tablet   No current facility-administered medications for this encounter.    Maia Plan [facility] Pre-Surgical Testing [phone] 02/17/19  1:38 PM

## 2019-02-20 NOTE — Anesthesia Preprocedure Evaluation (Addendum)
Anesthesia Evaluation  Patient identified by MRN, date of birth, ID band Patient awake    Reviewed: Allergy & Precautions, NPO status , Patient's Chart, lab work & pertinent test results  History of Anesthesia Complications Negative for: history of anesthetic complications  Airway Mallampati: I  TM Distance: >3 FB Neck ROM: Full    Dental  (+) Edentulous Upper, Edentulous Lower   Pulmonary COPD, former smoker,  02/17/2019 SARS coronavirus NEG   breath sounds clear to auscultation       Cardiovascular (-) angina+ CAD and + Past MI (1985)  (-) Cardiac Stents + Valvular Problems/Murmurs (mild-mod) AS  Rhythm:Regular Rate:Normal  '19 ECHO: EF 60-65%, normal LVF, mild-mod AS with Mean gradient (S): 17 mm Hg. Peak gradient (S): 34 mm Hg.    Neuro/Psych negative neurological ROS     GI/Hepatic negative GI ROS, Neg liver ROS,   Endo/Other  negative endocrine ROS  Renal/GU negative Renal ROS     Musculoskeletal  (+) Arthritis , Osteoarthritis,    Abdominal   Peds  Hematology negative hematology ROS (+)   Anesthesia Other Findings   Reproductive/Obstetrics                           Anesthesia Physical Anesthesia Plan  ASA: III  Anesthesia Plan: General   Post-op Pain Management:    Induction: Intravenous  PONV Risk Score and Plan: 3 and Ondansetron, Dexamethasone and Treatment may vary due to age or medical condition  Airway Management Planned: Oral ETT  Additional Equipment:   Intra-op Plan:   Post-operative Plan: Extubation in OR  Informed Consent: I have reviewed the patients History and Physical, chart, labs and discussed the procedure including the risks, benefits and alternatives for the proposed anesthesia with the patient or authorized representative who has indicated his/her understanding and acceptance.       Plan Discussed with: Surgeon and CRNA  Anesthesia Plan  Comments: (See PAT note 02/15/2019, Konrad Felix, PA-C Plan routine monitors, [place])       Anesthesia Quick Evaluation

## 2019-02-21 ENCOUNTER — Ambulatory Visit (HOSPITAL_COMMUNITY): Payer: Medicare HMO

## 2019-02-21 ENCOUNTER — Observation Stay (HOSPITAL_COMMUNITY): Payer: Medicare HMO

## 2019-02-21 ENCOUNTER — Encounter (HOSPITAL_COMMUNITY)
Admission: RE | Disposition: A | Discharge status: Home / Self Care | Payer: Self-pay | Source: Home / Self Care | Attending: Orthopedic Surgery

## 2019-02-21 ENCOUNTER — Ambulatory Visit (HOSPITAL_COMMUNITY): Payer: Medicare HMO | Admitting: Certified Registered Nurse Anesthetist

## 2019-02-21 ENCOUNTER — Observation Stay (HOSPITAL_COMMUNITY)
Admission: RE | Admit: 2019-02-21 | Discharge: 2019-02-22 | Disposition: A | Discharge status: Home / Self Care | Payer: Medicare HMO | Attending: Orthopedic Surgery | Admitting: Orthopedic Surgery

## 2019-02-21 ENCOUNTER — Encounter (HOSPITAL_COMMUNITY): Payer: Self-pay | Admitting: *Deleted

## 2019-02-21 ENCOUNTER — Ambulatory Visit (HOSPITAL_COMMUNITY): Payer: Medicare HMO | Admitting: Physician Assistant

## 2019-02-21 ENCOUNTER — Other Ambulatory Visit: Payer: Self-pay

## 2019-02-21 DIAGNOSIS — I252 Old myocardial infarction: Secondary | ICD-10-CM | POA: Insufficient documentation

## 2019-02-21 DIAGNOSIS — M25752 Osteophyte, left hip: Secondary | ICD-10-CM | POA: Diagnosis not present

## 2019-02-21 DIAGNOSIS — Z6826 Body mass index (BMI) 26.0-26.9, adult: Secondary | ICD-10-CM | POA: Diagnosis not present

## 2019-02-21 DIAGNOSIS — Z9861 Coronary angioplasty status: Secondary | ICD-10-CM | POA: Insufficient documentation

## 2019-02-21 DIAGNOSIS — I2510 Atherosclerotic heart disease of native coronary artery without angina pectoris: Secondary | ICD-10-CM | POA: Diagnosis not present

## 2019-02-21 DIAGNOSIS — N183 Chronic kidney disease, stage 3 unspecified: Secondary | ICD-10-CM | POA: Diagnosis not present

## 2019-02-21 DIAGNOSIS — Z87891 Personal history of nicotine dependence: Secondary | ICD-10-CM | POA: Diagnosis not present

## 2019-02-21 DIAGNOSIS — E785 Hyperlipidemia, unspecified: Secondary | ICD-10-CM | POA: Diagnosis not present

## 2019-02-21 DIAGNOSIS — M1612 Unilateral primary osteoarthritis, left hip: Secondary | ICD-10-CM | POA: Diagnosis not present

## 2019-02-21 DIAGNOSIS — Z86718 Personal history of other venous thrombosis and embolism: Secondary | ICD-10-CM | POA: Insufficient documentation

## 2019-02-21 DIAGNOSIS — Z96642 Presence of left artificial hip joint: Secondary | ICD-10-CM | POA: Diagnosis not present

## 2019-02-21 DIAGNOSIS — Z85828 Personal history of other malignant neoplasm of skin: Secondary | ICD-10-CM | POA: Diagnosis not present

## 2019-02-21 DIAGNOSIS — Z96649 Presence of unspecified artificial hip joint: Secondary | ICD-10-CM

## 2019-02-21 DIAGNOSIS — Z471 Aftercare following joint replacement surgery: Secondary | ICD-10-CM | POA: Diagnosis not present

## 2019-02-21 DIAGNOSIS — E663 Overweight: Secondary | ICD-10-CM | POA: Diagnosis not present

## 2019-02-21 DIAGNOSIS — J449 Chronic obstructive pulmonary disease, unspecified: Secondary | ICD-10-CM | POA: Insufficient documentation

## 2019-02-21 DIAGNOSIS — Z20828 Contact with and (suspected) exposure to other viral communicable diseases: Secondary | ICD-10-CM | POA: Diagnosis not present

## 2019-02-21 DIAGNOSIS — Z7982 Long term (current) use of aspirin: Secondary | ICD-10-CM | POA: Insufficient documentation

## 2019-02-21 DIAGNOSIS — Z419 Encounter for procedure for purposes other than remedying health state, unspecified: Secondary | ICD-10-CM

## 2019-02-21 HISTORY — PX: TOTAL HIP ARTHROPLASTY: SHX124

## 2019-02-21 LAB — TYPE AND SCREEN
ABO/RH(D): A POS
Antibody Screen: NEGATIVE

## 2019-02-21 SURGERY — ARTHROPLASTY, HIP, TOTAL, ANTERIOR APPROACH
Anesthesia: General | Site: Hip | Laterality: Left

## 2019-02-21 MED ORDER — ATORVASTATIN CALCIUM 40 MG PO TABS
40.0000 mg | ORAL_TABLET | Freq: Every day | ORAL | Status: DC
  Administered 2019-02-21: 40 mg via ORAL
  Filled 2019-02-21: qty 1

## 2019-02-21 MED ORDER — HYDROCODONE-ACETAMINOPHEN 5-325 MG PO TABS: 1.0000 | ORAL_TABLET | ORAL | Status: DC | PRN

## 2019-02-21 MED ORDER — BISACODYL 10 MG RE SUPP: 10.0000 mg | Freq: Every day | RECTAL | Status: DC | PRN

## 2019-02-21 MED ORDER — PROPOFOL 10 MG/ML IV BOLUS
INTRAVENOUS | Status: AC
  Filled 2019-02-21: qty 20

## 2019-02-21 MED ORDER — PHENYLEPHRINE 40 MCG/ML (10ML) SYRINGE FOR IV PUSH (FOR BLOOD PRESSURE SUPPORT)
PREFILLED_SYRINGE | INTRAVENOUS | Status: AC
  Filled 2019-02-21: qty 10

## 2019-02-21 MED ORDER — LIDOCAINE 2% (20 MG/ML) 5 ML SYRINGE
INTRAMUSCULAR | Status: AC
  Filled 2019-02-21: qty 5

## 2019-02-21 MED ORDER — ONDANSETRON HCL 4 MG/2ML IJ SOLN
INTRAMUSCULAR | Status: AC
  Filled 2019-02-21: qty 2

## 2019-02-21 MED ORDER — MEPERIDINE HCL 50 MG/ML IJ SOLN: 6.2500 mg | INTRAMUSCULAR | Status: DC | PRN

## 2019-02-21 MED ORDER — PROPOFOL 10 MG/ML IV BOLUS
INTRAVENOUS | Status: DC | PRN
  Administered 2019-02-21: 80 mg via INTRAVENOUS

## 2019-02-21 MED ORDER — ROCURONIUM BROMIDE 50 MG/5ML IV SOSY
PREFILLED_SYRINGE | INTRAVENOUS | Status: DC | PRN
  Administered 2019-02-21: 60 mg via INTRAVENOUS
  Administered 2019-02-21: 10 mg via INTRAVENOUS

## 2019-02-21 MED ORDER — EPHEDRINE 5 MG/ML INJ
INTRAVENOUS | Status: AC
  Filled 2019-02-21: qty 10

## 2019-02-21 MED ORDER — MAGNESIUM CITRATE PO SOLN: 1.0000 | Freq: Once | ORAL | Status: DC | PRN

## 2019-02-21 MED ORDER — CHLORHEXIDINE GLUCONATE 4 % EX LIQD: 60.0000 mL | Freq: Once | CUTANEOUS | Status: DC

## 2019-02-21 MED ORDER — ACETAMINOPHEN 325 MG PO TABS: 325.0000 mg | ORAL_TABLET | Freq: Four times a day (QID) | ORAL | Status: DC | PRN

## 2019-02-21 MED ORDER — METHOCARBAMOL 500 MG PO TABS: 500.0000 mg | ORAL_TABLET | Freq: Four times a day (QID) | ORAL | Status: DC | PRN

## 2019-02-21 MED ORDER — FENTANYL CITRATE (PF) 100 MCG/2ML IJ SOLN
INTRAMUSCULAR | Status: DC | PRN
  Administered 2019-02-21 (×6): 50 ug via INTRAVENOUS

## 2019-02-21 MED ORDER — TRANEXAMIC ACID-NACL 1000-0.7 MG/100ML-% IV SOLN
1000.0000 mg | Freq: Once | INTRAVENOUS | Status: AC
  Administered 2019-02-21: 1000 mg via INTRAVENOUS
  Filled 2019-02-21: qty 100

## 2019-02-21 MED ORDER — METHOCARBAMOL 500 MG IVPB - SIMPLE MED
500.0000 mg | Freq: Four times a day (QID) | INTRAVENOUS | Status: DC | PRN
  Filled 2019-02-21: qty 50

## 2019-02-21 MED ORDER — ONDANSETRON HCL 4 MG PO TABS: 4.0000 mg | ORAL_TABLET | Freq: Four times a day (QID) | ORAL | Status: DC | PRN

## 2019-02-21 MED ORDER — FENTANYL CITRATE (PF) 100 MCG/2ML IJ SOLN
INTRAMUSCULAR | Status: AC
  Filled 2019-02-21: qty 2

## 2019-02-21 MED ORDER — DIPHENHYDRAMINE HCL 12.5 MG/5ML PO ELIX: 12.5000 mg | ORAL_SOLUTION | ORAL | Status: DC | PRN

## 2019-02-21 MED ORDER — MORPHINE SULFATE (PF) 2 MG/ML IV SOLN: 0.5000 mg | INTRAVENOUS | Status: DC | PRN

## 2019-02-21 MED ORDER — FERROUS SULFATE 325 (65 FE) MG PO TABS: 325.0000 mg | ORAL_TABLET | Freq: Three times a day (TID) | ORAL | 0 refills | Status: DC

## 2019-02-21 MED ORDER — METHOCARBAMOL 500 MG PO TABS: 500.0000 mg | ORAL_TABLET | Freq: Four times a day (QID) | ORAL | 0 refills | Status: DC | PRN

## 2019-02-21 MED ORDER — METOCLOPRAMIDE HCL 5 MG PO TABS: 5.0000 mg | ORAL_TABLET | Freq: Three times a day (TID) | ORAL | Status: DC | PRN

## 2019-02-21 MED ORDER — MENTHOL 3 MG MT LOZG: 1.0000 | LOZENGE | OROMUCOSAL | Status: DC | PRN

## 2019-02-21 MED ORDER — TRANEXAMIC ACID-NACL 1000-0.7 MG/100ML-% IV SOLN
1000.0000 mg | INTRAVENOUS | Status: AC
  Administered 2019-02-21: 1000 mg via INTRAVENOUS
  Filled 2019-02-21: qty 100

## 2019-02-21 MED ORDER — ONDANSETRON HCL 4 MG/2ML IJ SOLN: 4.0000 mg | Freq: Four times a day (QID) | INTRAMUSCULAR | Status: DC | PRN

## 2019-02-21 MED ORDER — ALUM & MAG HYDROXIDE-SIMETH 200-200-20 MG/5ML PO SUSP: 15.0000 mL | ORAL | Status: DC | PRN

## 2019-02-21 MED ORDER — CEFAZOLIN SODIUM-DEXTROSE 2-4 GM/100ML-% IV SOLN
2.0000 g | INTRAVENOUS | Status: AC
  Administered 2019-02-21: 11:00:00 2 g via INTRAVENOUS
  Filled 2019-02-21: qty 100

## 2019-02-21 MED ORDER — SODIUM CHLORIDE 0.9 % IR SOLN
Status: DC | PRN
  Administered 2019-02-21: 1000 mL

## 2019-02-21 MED ORDER — SODIUM CHLORIDE 0.9 % IV BOLUS
250.0000 mL | Freq: Once | INTRAVENOUS | Status: AC
  Administered 2019-02-21: 250 mL via INTRAVENOUS

## 2019-02-21 MED ORDER — DEXAMETHASONE SODIUM PHOSPHATE 10 MG/ML IJ SOLN
INTRAMUSCULAR | Status: AC
  Filled 2019-02-21: qty 1

## 2019-02-21 MED ORDER — PHENYLEPHRINE HCL (PRESSORS) 10 MG/ML IV SOLN
INTRAVENOUS | Status: AC
  Filled 2019-02-21: qty 2

## 2019-02-21 MED ORDER — NICOTINE POLACRILEX 2 MG MT GUM
2.0000 mg | CHEWING_GUM | Freq: Every day | OROMUCOSAL | Status: DC | PRN
  Filled 2019-02-21: qty 1

## 2019-02-21 MED ORDER — POLYETHYLENE GLYCOL 3350 17 G PO PACK: 17.0000 g | PACK | Freq: Two times a day (BID) | ORAL | 0 refills | Status: DC

## 2019-02-21 MED ORDER — PREDNISOLONE ACETATE 1 % OP SUSP
1.0000 [drp] | Freq: Two times a day (BID) | OPHTHALMIC | Status: DC
  Filled 2019-02-21: qty 5

## 2019-02-21 MED ORDER — ONDANSETRON HCL 4 MG/2ML IJ SOLN
INTRAMUSCULAR | Status: DC | PRN
  Administered 2019-02-21: 4 mg via INTRAVENOUS

## 2019-02-21 MED ORDER — PHENYLEPHRINE 40 MCG/ML (10ML) SYRINGE FOR IV PUSH (FOR BLOOD PRESSURE SUPPORT)
PREFILLED_SYRINGE | INTRAVENOUS | Status: DC | PRN
  Administered 2019-02-21: 80 ug via INTRAVENOUS
  Administered 2019-02-21: 40 ug via INTRAVENOUS
  Administered 2019-02-21 (×4): 80 ug via INTRAVENOUS

## 2019-02-21 MED ORDER — HYDROMORPHONE HCL 1 MG/ML IJ SOLN: 0.2500 mg | INTRAMUSCULAR | Status: DC | PRN

## 2019-02-21 MED ORDER — MIDAZOLAM HCL 2 MG/2ML IJ SOLN: 0.5000 mg | Freq: Once | INTRAMUSCULAR | Status: DC | PRN

## 2019-02-21 MED ORDER — CEFAZOLIN SODIUM-DEXTROSE 2-4 GM/100ML-% IV SOLN
2.0000 g | Freq: Four times a day (QID) | INTRAVENOUS | Status: AC
  Administered 2019-02-21 (×2): 2 g via INTRAVENOUS
  Filled 2019-02-21 (×2): qty 100

## 2019-02-21 MED ORDER — LACTATED RINGERS IV SOLN
INTRAVENOUS | Status: DC
  Administered 2019-02-21 (×2): via INTRAVENOUS

## 2019-02-21 MED ORDER — DEXAMETHASONE SODIUM PHOSPHATE 10 MG/ML IJ SOLN
10.0000 mg | Freq: Once | INTRAMUSCULAR | Status: AC
  Administered 2019-02-22: 10 mg via INTRAVENOUS
  Filled 2019-02-21: qty 1

## 2019-02-21 MED ORDER — HYDROCODONE-ACETAMINOPHEN 7.5-325 MG PO TABS: 1.0000 | ORAL_TABLET | ORAL | Status: DC | PRN

## 2019-02-21 MED ORDER — DOCUSATE SODIUM 100 MG PO CAPS: 100.0000 mg | ORAL_CAPSULE | Freq: Two times a day (BID) | ORAL | 0 refills | Status: DC

## 2019-02-21 MED ORDER — DEXAMETHASONE SODIUM PHOSPHATE 10 MG/ML IJ SOLN
10.0000 mg | Freq: Once | INTRAMUSCULAR | Status: AC
  Administered 2019-02-21: 12:00:00 10 mg via INTRAVENOUS

## 2019-02-21 MED ORDER — RIVAROXABAN 10 MG PO TABS
10.0000 mg | ORAL_TABLET | ORAL | Status: DC
  Administered 2019-02-22: 10 mg via ORAL
  Filled 2019-02-21 (×2): qty 1

## 2019-02-21 MED ORDER — STERILE WATER FOR IRRIGATION IR SOLN
Status: DC | PRN
  Administered 2019-02-21 (×2): 1000 mL

## 2019-02-21 MED ORDER — METOCLOPRAMIDE HCL 5 MG/ML IJ SOLN: 5.0000 mg | Freq: Three times a day (TID) | INTRAMUSCULAR | Status: DC | PRN

## 2019-02-21 MED ORDER — POLYETHYLENE GLYCOL 3350 17 G PO PACK
17.0000 g | PACK | Freq: Two times a day (BID) | ORAL | Status: DC
  Administered 2019-02-21 – 2019-02-22 (×2): 17 g via ORAL
  Filled 2019-02-21 (×2): qty 1

## 2019-02-21 MED ORDER — DOCUSATE SODIUM 100 MG PO CAPS
100.0000 mg | ORAL_CAPSULE | Freq: Two times a day (BID) | ORAL | Status: DC
  Administered 2019-02-21 – 2019-02-22 (×2): 100 mg via ORAL
  Filled 2019-02-21 (×2): qty 1

## 2019-02-21 MED ORDER — SUGAMMADEX SODIUM 500 MG/5ML IV SOLN
INTRAVENOUS | Status: AC
  Filled 2019-02-21: qty 5

## 2019-02-21 MED ORDER — ASPIRIN 81 MG PO CHEW: 81.0000 mg | CHEWABLE_TABLET | Freq: Two times a day (BID) | ORAL | 0 refills | Status: AC

## 2019-02-21 MED ORDER — FERROUS SULFATE 325 (65 FE) MG PO TABS
325.0000 mg | ORAL_TABLET | Freq: Three times a day (TID) | ORAL | Status: DC
  Administered 2019-02-22: 325 mg via ORAL
  Filled 2019-02-21: qty 1

## 2019-02-21 MED ORDER — PHENOL 1.4 % MT LIQD: 1.0000 | OROMUCOSAL | Status: DC | PRN

## 2019-02-21 MED ORDER — HYDROCODONE-ACETAMINOPHEN 5-325 MG PO TABS: 1.0000 | ORAL_TABLET | Freq: Four times a day (QID) | ORAL | 0 refills | Status: DC | PRN

## 2019-02-21 MED ORDER — SUGAMMADEX SODIUM 500 MG/5ML IV SOLN
INTRAVENOUS | Status: DC | PRN
  Administered 2019-02-21: 300 mg via INTRAVENOUS

## 2019-02-21 MED ORDER — RIVAROXABAN 10 MG PO TABS: 10.0000 mg | ORAL_TABLET | Freq: Every day | ORAL | 0 refills | Status: DC

## 2019-02-21 MED ORDER — PROMETHAZINE HCL 25 MG/ML IJ SOLN: 6.2500 mg | INTRAMUSCULAR | Status: DC | PRN

## 2019-02-21 MED ORDER — PHENYLEPHRINE 40 MCG/ML (10ML) SYRINGE FOR IV PUSH (FOR BLOOD PRESSURE SUPPORT)
PREFILLED_SYRINGE | INTRAVENOUS | Status: AC
  Filled 2019-02-21: qty 20

## 2019-02-21 MED ORDER — SODIUM CHLORIDE 0.9 % IV SOLN: INTRAVENOUS | Status: DC

## 2019-02-21 MED ORDER — LIDOCAINE 2% (20 MG/ML) 5 ML SYRINGE
INTRAMUSCULAR | Status: DC | PRN
  Administered 2019-02-21: 20 mg via INTRAVENOUS

## 2019-02-21 SURGICAL SUPPLY — 48 items
ARTICULEZE HEAD (Hips) ×2 IMPLANT
BAG DECANTER FOR FLEXI CONT (MISCELLANEOUS) IMPLANT
BAG ZIPLOCK 12X15 (MISCELLANEOUS) IMPLANT
BLADE SAG 18X100X1.27 (BLADE) ×2 IMPLANT
BLADE SURG SZ10 CARB STEEL (BLADE) ×4 IMPLANT
COVER PERINEAL POST (MISCELLANEOUS) ×2 IMPLANT
COVER SURGICAL LIGHT HANDLE (MISCELLANEOUS) ×2 IMPLANT
COVER WAND RF STERILE (DRAPES) IMPLANT
CUP ACET PINNACLE SECTR 56MM (Hips) ×1 IMPLANT
DERMABOND ADVANCED (GAUZE/BANDAGES/DRESSINGS) ×1
DERMABOND ADVANCED .7 DNX12 (GAUZE/BANDAGES/DRESSINGS) ×1 IMPLANT
DRAPE STERI IOBAN 125X83 (DRAPES) ×2 IMPLANT
DRAPE U-SHAPE 47X51 STRL (DRAPES) ×4 IMPLANT
DRESSING AQUACEL AG SP 3.5X10 (GAUZE/BANDAGES/DRESSINGS) ×1 IMPLANT
DRSG AQUACEL AG SP 3.5X10 (GAUZE/BANDAGES/DRESSINGS) ×2
DURAPREP 26ML APPLICATOR (WOUND CARE) ×2 IMPLANT
ELECT BLADE TIP CTD 4 INCH (ELECTRODE) ×2 IMPLANT
ELECT REM PT RETURN 15FT ADLT (MISCELLANEOUS) ×2 IMPLANT
ELIMINATOR HOLE APEX DEPUY (Hips) ×2 IMPLANT
GLOVE BIO SURGEON STRL SZ 6 (GLOVE) ×4 IMPLANT
GLOVE BIOGEL PI IND STRL 6.5 (GLOVE) ×1 IMPLANT
GLOVE BIOGEL PI IND STRL 7.5 (GLOVE) ×1 IMPLANT
GLOVE BIOGEL PI IND STRL 8.5 (GLOVE) ×1 IMPLANT
GLOVE BIOGEL PI INDICATOR 6.5 (GLOVE) ×1
GLOVE BIOGEL PI INDICATOR 7.5 (GLOVE) ×1
GLOVE BIOGEL PI INDICATOR 8.5 (GLOVE) ×1
GLOVE ECLIPSE 8.0 STRL XLNG CF (GLOVE) ×4 IMPLANT
GLOVE ORTHO TXT STRL SZ7.5 (GLOVE) ×4 IMPLANT
GOWN STRL REUS W/TWL LRG LVL3 (GOWN DISPOSABLE) ×4 IMPLANT
GOWN STRL REUS W/TWL XL LVL3 (GOWN DISPOSABLE) ×2 IMPLANT
HEAD ARTICULEZE (Hips) ×1 IMPLANT
HOLDER FOLEY CATH W/STRAP (MISCELLANEOUS) ×2 IMPLANT
KIT TURNOVER KIT A (KITS) IMPLANT
PACK ANTERIOR HIP CUSTOM (KITS) ×2 IMPLANT
PENCIL SMOKE EVACUATOR (MISCELLANEOUS) IMPLANT
PINNACLE ALTRX PLUS 4 N 36X56 (Hips) ×2 IMPLANT
PINNACLE SECTOR CUP 56MM (Hips) ×2 IMPLANT
SCREW 6.5MMX30MM (Screw) ×2 IMPLANT
STEM FEM ACTIS HIGH SZ8 (Stem) ×2 IMPLANT
SUT MNCRL AB 4-0 PS2 18 (SUTURE) ×2 IMPLANT
SUT STRATAFIX 0 PDS 27 VIOLET (SUTURE) ×2
SUT VIC AB 1 CT1 36 (SUTURE) ×6 IMPLANT
SUT VIC AB 2-0 CT1 27 (SUTURE) ×2
SUT VIC AB 2-0 CT1 TAPERPNT 27 (SUTURE) ×2 IMPLANT
SUTURE STRATFX 0 PDS 27 VIOLET (SUTURE) ×1 IMPLANT
TRAY FOLEY MTR SLVR 16FR STAT (SET/KITS/TRAYS/PACK) IMPLANT
WATER STERILE IRR 1000ML POUR (IV SOLUTION) ×2 IMPLANT
YANKAUER SUCT BULB TIP 10FT TU (MISCELLANEOUS) IMPLANT

## 2019-02-21 NOTE — Interval H&P Note (Signed)
History and Physical Interval Note:  02/21/2019 9:58 AM  Garrett Moore  has presented today for surgery, with the diagnosis of Left hip osteoarthritis.  The various methods of treatment have been discussed with the patient and family. After consideration of risks, benefits and other options for treatment, the patient has consented to  Procedure(s) with comments: TOTAL HIP ARTHROPLASTY ANTERIOR APPROACH (Left) - 70 mins as a surgical intervention.  The patient's history has been reviewed, patient examined, no change in status, stable for surgery.  I have reviewed the patient's chart and labs.  Questions were answered to the patient's satisfaction.     Mauri Pole

## 2019-02-21 NOTE — Transfer of Care (Signed)
Immediate Anesthesia Transfer of Care Note  Patient: Garrett Moore  Procedure(s) Performed: TOTAL HIP ARTHROPLASTY ANTERIOR APPROACH (Left Hip)  Patient Location: PACU  Anesthesia Type:General  Level of Consciousness: awake, drowsy and patient cooperative  Airway & Oxygen Therapy: Patient Spontanous Breathing and Patient connected to face mask oxygen  Post-op Assessment: Report given to RN and Post -op Vital signs reviewed and stable  Post vital signs: Reviewed and stable  Last Vitals:  Vitals Value Taken Time  BP 146/81 02/21/19 1242  Temp    Pulse 73 02/21/19 1245  Resp 15 02/21/19 1245  SpO2 100 % 02/21/19 1245  Vitals shown include unvalidated device data.  Last Pain:  Vitals:   02/21/19 1242  TempSrc:   PainSc: (P) 0-No pain         Complications: No apparent anesthesia complications

## 2019-02-21 NOTE — Evaluation (Signed)
Physical Therapy Evaluation Patient Details Name: Garrett Moore MRN: [id number] DOB: 03/18/1941 Today's Date: 02/21/2019   History of Present Illness  Pt is 78 y.o. male s/p Lt THA anterior approach on 02/21/19 with PMH significant for MI, HLD< COPD, CAD, CKD, and OA.    Clinical Impression  Garrett Moore is a 78 y.o. male POD 0 s/p Lt THA anterior approach. Patient reports independence with mobility at baseline. Patient is now limited by functional impairments (see PT problem list below) and requires min assist for transfers and gait with RW. Patient was able to ambulate ~100 feet with RW and min guard/assist. Patient instructed in exercise to facilitate ROM and circulation. Patient will benefit from continued skilled PT interventions to address impairments and progress towards PLOF. Acute PT will follow to progress mobility and stair training in preparation for safe discharge home    Follow Up Recommendations Follow surgeon's recommendation for DC plan and follow-up therapies    Equipment Recommendations  3in1 (PT);Rolling walker with 5" wheels    Recommendations for Other Services       Precautions / Restrictions Precautions Precautions: Fall Restrictions Weight Bearing Restrictions: No      Mobility  Bed Mobility Overal bed mobility: Needs Assistance Bed Mobility: Supine to Sit     Supine to sit: HOB elevated;Supervision     General bed mobility comments: cues or patien to use bed rails and to pivot to bring feet to floor  Transfers Overall transfer level: Needs assistance Equipment used: Rolling walker (2 wheeled) Transfers: Sit to/from Stand Sit to Stand: Min assist         General transfer comment: cues for hand placement and technique with RW, assist to initiate power up, pt steady with rising  Ambulation/Gait Ambulation/Gait assistance: Min assist;Min guard Gait Distance (Feet): 90 Feet Assistive device: Rolling walker (2 wheeled) Gait  Pattern/deviations: Step-through pattern;Decreased stride length Gait velocity: decreased   General Gait Details: cues for safe step pattern and assist to maintain safe proximity to RW. pt with step to pattern initially and improved to step through pattern as he progressed. no overt LOB noted.  Stairs            Wheelchair Mobility    Modified Rankin (Stroke Patients Only)       Balance Overall balance assessment: Needs assistance Sitting-balance support: Feet supported;No upper extremity supported Sitting balance-Leahy Scale: Good     Standing balance support: Bilateral upper extremity supported;During functional activity Standing balance-Leahy Scale: Fair            Pertinent Vitals/Pain Pain Assessment: No/denies pain    Home Living Family/patient expects to be discharged to:: Private residence Living Arrangements: Spouse/significant other Available Help at Discharge: Family(pt's wife lives with him, his daughter is in [place] and son lives in [place]) Type of Home: House Home Access: Stairs to enter;Ramped entrance   Entrance Stairs-Number of Steps: 2 at front, ramped entrance on side Home Layout: One level;Laundry or work area in [place]: Environmental consultant - 2 wheels;Toilet riser Additional Comments: pt lives with his wife who uses a SPC; the walker is his wifes however pt reports she doesn't use it.    Prior Function Level of Independence: Independent               Hand Dominance   Dominant Hand: Right    Extremity/Trunk Assessment   Upper Extremity Assessment Upper Extremity Assessment: Overall WFL for tasks assessed    Lower Extremity Assessment Lower Extremity Assessment:  Overall WFL for tasks assessed;LLE deficits/detail RLE Sensation: WNL RLE Coordination: WNL LLE Sensation: WNL LLE Coordination: WNL    Cervical / Trunk Assessment Cervical / Trunk Assessment: Normal  Communication   Communication: No difficulties   Cognition Arousal/Alertness: Awake/alert Behavior During Therapy: WFL for tasks assessed/performed Overall Cognitive Status: Within Functional Limits for tasks assessed                 General Comments      Exercises Total Joint Exercises Ankle Circles/Pumps: AROM;15 reps;Seated;Both Quad Sets: AROM;10 reps;Seated;Left Heel Slides: AROM;Seated;10 reps;Left   Assessment/Plan    PT Assessment Patient needs continued PT services  PT Problem List Decreased strength;Decreased balance;Decreased mobility;Decreased range of motion;Decreased activity tolerance;Decreased knowledge of use of DME       PT Treatment Interventions DME instruction;Functional mobility training;Balance training;Patient/family education;Gait training;Therapeutic activities;Therapeutic exercise;Stair training    PT Goals (Current goals can be found in the Care Plan section)  Acute Rehab PT Goals Patient Stated Goal: to get back to independence PT Goal Formulation: With patient Time For Goal Achievement: 02/28/19 Potential to Achieve Goals: Good    Frequency 7X/week    AM-PAC PT "6 Clicks" Mobility  Outcome Measure Help needed turning from your back to your side while in a flat bed without using bedrails?: A Little Help needed moving from lying on your back to sitting on the side of a flat bed without using bedrails?: A Little Help needed moving to and from a bed to a chair (including a wheelchair)?: A Little Help needed standing up from a chair using your arms (e.g., wheelchair or bedside chair)?: A Little Help needed to walk in hospital room?: A Little Help needed climbing 3-5 steps with a railing? : A Little 6 Click Score: 18    End of Session Equipment Utilized During Treatment: Gait belt Activity Tolerance: Patient tolerated treatment well Patient left: in chair;with call bell/phone within reach;with chair alarm set Nurse Communication: Mobility status PT Visit Diagnosis: Muscle weakness  (generalized) (M62.81);Difficulty in walking, not elsewhere classified (R26.2)    Time: [id number] PT Time Calculation (min) (ACUTE ONLY): 27 min   Charges:   PT Evaluation $PT Eval Low Complexity: 1 Low PT Treatments $Therapeutic Exercise: 8-22 mins        Kipp Brood, PT, DPT Physical Therapist with [facility]  02/21/2019 5:17 PM

## 2019-02-21 NOTE — Anesthesia Procedure Notes (Signed)
Procedure Name: Intubation Performed by: West Pugh, CRNA Pre-anesthesia Checklist: Patient identified, Emergency Drugs available, Suction available, Patient being monitored and Timeout performed Patient Re-evaluated:Patient Re-evaluated prior to induction Oxygen Delivery Method: Circle system utilized Preoxygenation: Pre-oxygenation with 100% oxygen Induction Type: IV induction and Cricoid Pressure applied Ventilation: Mask ventilation without difficulty Laryngoscope Size: Mac and 4 Grade View: Grade II Tube type: Oral Tube size: 7.5 mm Number of attempts: 1 Airway Equipment and Method: Stylet Placement Confirmation: ETT inserted through vocal cords under direct vision,  positive ETCO2,  CO2 detector and breath sounds checked- equal and bilateral Secured at: 21 cm Tube secured with: Tape Dental Injury: Teeth and Oropharynx as per pre-operative assessment

## 2019-02-21 NOTE — Op Note (Signed)
NAME:  Garrett Moore                ACCOUNT NO.: [id number]      MEDICAL RECORD NO.: [id number]      FACILITY:  [facility]      PHYSICIAN:  Mauri Pole  DATE OF BIRTH:  11-15-1940     DATE OF PROCEDURE:  02/21/2019                                 OPERATIVE REPORT         PREOPERATIVE DIAGNOSIS: Left  hip osteoarthritis.      POSTOPERATIVE DIAGNOSIS:  Left hip osteoarthritis.      PROCEDURE:  Left total hip replacement through an anterior approach   utilizing DePuy THR system, component size 42mm pinnacle cup, a size 36+4 neutral   Altrex liner, a size 8 Hi Actis stem with a 36+5 Articuleze metal head ball.      SURGEON:  Pietro Cassis. Alvan Dame, M.D.      ASSISTANT:  Danae Orleans, PA-C     ANESTHESIA:  General.      SPECIMENS:  None.      COMPLICATIONS:  None.      BLOOD LOSS:  350 cc     DRAINS:  None.      INDICATION OF THE PROCEDURE:  Garrett Moore is a 79 y.o. male who had   presented to office for evaluation of left hip pain.  Radiographs revealed   progressive degenerative changes with bone-on-bone   articulation of the  hip joint, including subchondral cystic changes and osteophytes.  The patient had painful limited range of   motion significantly affecting their overall quality of life and function.  The patient was failing to    respond to conservative measures including medications and/or injections and activity modification and at this point was ready   to proceed with more definitive measures.  Consent was obtained for   benefit of pain relief.  Specific risks of infection, DVT, component   failure, dislocation, neurovascular injury, and need for revision surgery were reviewed in the office as well discussion of   the anterior versus posterior approach were reviewed.     PROCEDURE IN DETAIL:  The patient was brought to operative theater.   Once adequate anesthesia, preoperative antibiotics, 2 gm of Ancef, 1 gm of Tranexamic Acid, and 10  mg of Decadron were administered, the patient was positioned supine on the [employer] table.  Once the patient was safely positioned with adequate padding of boney prominences we predraped out the hip, and used fluoroscopy to confirm orientation of the pelvis.      The left hip was then prepped and draped from proximal iliac crest to   mid thigh with a shower curtain technique.      Time-out was performed identifying the patient, planned procedure, and the appropriate extremity.     An incision was then made 2 cm lateral to the   anterior superior iliac spine extending over the orientation of the   tensor fascia lata muscle and sharp dissection was carried down to the   fascia of the muscle.      The fascia was then incised.  The muscle belly was identified and swept   laterally and retractor placed along the superior neck.  Following   cauterization of the circumflex vessels and removing some pericapsular  fat, a second cobra retractor was placed on the inferior neck.  A T-capsulotomy was made along the line of the   superior neck to the trochanteric fossa, then extended proximally and   distally.  Tag sutures were placed and the retractors were then placed   intracapsular.  We then identified the trochanteric fossa and   orientation of my neck cut and then made a neck osteotomy with the femur on traction.  The femoral   head was removed without difficulty or complication.  Traction was let   off and retractors were placed posterior and anterior around the   acetabulum.      The labrum and foveal tissue were debrided.  I began reaming with a 48 mm   reamer and reamed up to 55 mm reamer with good bony bed preparation and a 56 mm  cup was chosen.  The final 56 mm Pinnacle cup was then impacted under fluoroscopy to confirm the depth of penetration and orientation with respect to   Abduction and forward flexion.  A screw was placed into the ilium followed by the hole eliminator.  The final    36+4 neutral Altrex liner was impacted with good visualized rim fit.  The cup was positioned anatomically within the acetabular portion of the pelvis.      At this point, the femur was rolled to 100 degrees.  Further capsule was   released off the inferior aspect of the femoral neck.  I then   released the superior capsule proximally.  With the leg in a neutral position the hook was placed laterally   along the femur under the vastus lateralis origin and elevated manually and then held in position using the hook attachment on the bed.  The leg was then extended and adducted with the leg rolled to 100   degrees of external rotation.  Retractors were placed along the medial calcar and posteriorly over the greater trochanter.  Once the proximal femur was fully   exposed, I used a box osteotome to set orientation.  I then began   broaching with the starting chili pepper broach and passed this by hand and then broached up to 8.  With the 8 broach in place I chose a high offset neck and did several trial reductions.  The offset was appropriate, leg lengths   appeared to be equal best matched with the +5 head ball trial confirmed radiographically.   Given these findings, I went ahead and dislocated the hip, repositioned all   retractors and positioned the right hip in the extended and abducted position.  The final 8 Hi Actis stem was   chosen and it was impacted down to the level of neck cut.  Based on this   and the trial reductions, a final 36+5 Articuleze metal ball was chosen and   impacted onto a clean and dry trunnion, and the hip was reduced.  The   hip had been irrigated throughout the case again at this point.  I did   reapproximate the superior capsular leaflet to the anterior leaflet   using #1 Vicryl.  The fascia of the   tensor fascia lata muscle was then reapproximated using #1 Vicryl and #0 Stratafix sutures.  The   remaining wound was closed with 2-0 Vicryl and running 4-0 Monocryl.    The hip was cleaned, dried, and dressed sterilely using Dermabond and   Aquacel dressing.  The patient was then brought   to recovery room in  stable condition tolerating the procedure well.    Danae Orleans, PA-C was present for the entirety of the case involved from   preoperative positioning, perioperative retractor management, general   facilitation of the case, as well as primary wound closure as assistant.            Pietro Cassis Alvan Dame, M.D.        02/21/2019 12:16 PM

## 2019-02-21 NOTE — Anesthesia Postprocedure Evaluation (Signed)
Anesthesia Post Note  Patient: Garrett Moore  Procedure(s) Performed: TOTAL HIP ARTHROPLASTY ANTERIOR APPROACH (Left Hip)     Patient location during evaluation: PACU Anesthesia Type: General Level of consciousness: awake and alert, patient cooperative and oriented Pain management: pain level controlled Vital Signs Assessment: post-procedure vital signs reviewed and stable Respiratory status: spontaneous breathing, nonlabored ventilation and respiratory function stable Cardiovascular status: blood pressure returned to baseline and stable Postop Assessment: no apparent nausea or vomiting Anesthetic complications: no    Last Vitals:  Vitals:   02/21/19 1511 02/21/19 1611  BP: 139/81 133/70  Pulse: 77 85  Resp: 18 17  Temp:    SpO2: 98% 97%    Last Pain:  Vitals:   02/21/19 1414  TempSrc: Oral  PainSc:                  Masaji Billups,E. Jemiah Cuadra

## 2019-02-22 ENCOUNTER — Encounter (HOSPITAL_COMMUNITY): Payer: Self-pay | Admitting: Orthopedic Surgery

## 2019-02-22 ENCOUNTER — Other Ambulatory Visit: Payer: Self-pay

## 2019-02-22 DIAGNOSIS — E785 Hyperlipidemia, unspecified: Secondary | ICD-10-CM | POA: Diagnosis not present

## 2019-02-22 DIAGNOSIS — Z7982 Long term (current) use of aspirin: Secondary | ICD-10-CM | POA: Diagnosis not present

## 2019-02-22 DIAGNOSIS — E663 Overweight: Secondary | ICD-10-CM | POA: Diagnosis not present

## 2019-02-22 DIAGNOSIS — N183 Chronic kidney disease, stage 3 unspecified: Secondary | ICD-10-CM | POA: Diagnosis not present

## 2019-02-22 DIAGNOSIS — Z87891 Personal history of nicotine dependence: Secondary | ICD-10-CM | POA: Diagnosis not present

## 2019-02-22 DIAGNOSIS — M1612 Unilateral primary osteoarthritis, left hip: Secondary | ICD-10-CM | POA: Diagnosis not present

## 2019-02-22 DIAGNOSIS — J449 Chronic obstructive pulmonary disease, unspecified: Secondary | ICD-10-CM | POA: Diagnosis not present

## 2019-02-22 DIAGNOSIS — I2510 Atherosclerotic heart disease of native coronary artery without angina pectoris: Secondary | ICD-10-CM | POA: Diagnosis not present

## 2019-02-22 DIAGNOSIS — I252 Old myocardial infarction: Secondary | ICD-10-CM | POA: Diagnosis not present

## 2019-02-22 LAB — CBC
HCT: 38.3 % — ABNORMAL LOW (ref 39.0–52.0)
Hemoglobin: 12.4 g/dL — ABNORMAL LOW (ref 13.0–17.0)
MCH: 31.9 pg (ref 26.0–34.0)
MCHC: 32.4 g/dL (ref 30.0–36.0)
MCV: 98.5 fL (ref 80.0–100.0)
Platelets: 272 10*3/uL (ref 150–400)
RBC: 3.89 MIL/uL — ABNORMAL LOW (ref 4.22–5.81)
RDW: 12.4 % (ref 11.5–15.5)
WBC: 19.4 10*3/uL — ABNORMAL HIGH (ref 4.0–10.5)
nRBC: 0 % (ref 0.0–0.2)

## 2019-02-22 LAB — BASIC METABOLIC PANEL
Anion gap: 10 (ref 5–15)
BUN: 15 mg/dL (ref 8–23)
CO2: 23 mmol/L (ref 22–32)
Calcium: 8.7 mg/dL — ABNORMAL LOW (ref 8.9–10.3)
Chloride: 101 mmol/L (ref 98–111)
Creatinine, Ser: 1.29 mg/dL — ABNORMAL HIGH (ref 0.61–1.24)
GFR calc Af Amer: >60 mL/min (ref >60)
GFR calc non Af Amer: 53 mL/min — ABNORMAL LOW (ref >60)
Glucose, Bld: 180 mg/dL — ABNORMAL HIGH (ref 70–99)
Potassium: 3.9 mmol/L (ref 3.5–5.1)
Sodium: 134 mmol/L — ABNORMAL LOW (ref 135–145)

## 2019-02-22 NOTE — Progress Notes (Signed)
     Subjective: 1 Day Post-Op Procedure(s) (LRB): TOTAL HIP ARTHROPLASTY ANTERIOR APPROACH (Left)   Patient reports pain as mild, pain controlled. No events throughout the night. Dr. Alvan Dame discussed the procedure, findings and expectations moving forward.  Patient will be discharged home, if they do well with PT.  They will follow up in the clinic in 2 weeks. They know to call with any questions or concerns.     Objective:   VITALS:   Vitals:   02/22/19 0119 02/22/19 0547  BP: (!) 141/65 127/66  Pulse: (!) 105 87  Resp: 16 16  Temp: 97.7 F (36.5 C) 97.6 F (36.4 C)  SpO2: 95% 95%    Dorsiflexion/Plantar flexion intact Incision: dressing C/D/I No cellulitis present Compartment soft  LABS Recent Labs    02/22/19 0236  HGB 12.4*  HCT 38.3*  WBC 19.4*  PLT 272    Recent Labs    02/22/19 0236  NA 134*  K 3.9  BUN 15  CREATININE 1.29*  GLUCOSE 180*     Assessment/Plan: 1 Day Post-Op Procedure(s) (LRB): TOTAL HIP ARTHROPLASTY ANTERIOR APPROACH (Left) Foley cath d/c'ed Advance diet Up with therapy D/C IV fluids Discharge home Follow up in 2 weeks at [facility] Follow up with OLIN,Kayceon Oki D in 2 weeks.  Contact information:  [facility] [address] [place] [id number]    Overweight (BMI 25-29.9) Estimated body mass index is 26.75 kg/m as calculated from the following:   Height as of this encounter: 6' (1.829 m).   Weight as of this encounter: 89.4 kg. Patient also counseled that weight may inhibit the healing process Patient counseled that losing weight will help with future health issues         West Pugh. Kealan Buchan   PAC  02/22/2019, 8:33 AM

## 2019-02-22 NOTE — Plan of Care (Signed)
Plan of care reviewed and discussed with the patient. 

## 2019-02-22 NOTE — TOC Transition Note (Signed)
Transition of Care [facility]) - CM/SW Discharge Note   Patient Details  Name: Garrett Moore MRN: [id number] Date of Birth: 05/22/1940  Transition of Care [facility]) CM/SW Contact:  Lia Hopping, [place] Phone Number: 02/22/2019, 11:28 AM   Clinical Narrative:    Therapy Plan: HEP DME delivered to the patient room by [employer].    Final next level of care: Home/Self Care(HEP) Barriers to Discharge: No Barriers Identified   Patient Goals and CMS Choice   CMS Medicare.gov Compare Post Acute Care list provided to:: Patient Choice offered to / list presented to : Patient  Discharge Placement  Home                      Discharge Plan and Services                DME Arranged: 3-N-1, Walker rolling DME Agency: [employer] Date DME Agency Contacted: 02/22/19 Time DME Agency Contacted: 0900 Representative spoke with at DME Agency: [place] ([place] Interventions     Readmission Risk Interventions No flowsheet data found.

## 2019-02-22 NOTE — Progress Notes (Signed)
Reviewed d/c instructions w/ patient. Verbalized understanding of instructions. Will d/c home w/ all belongings, instructions, equipment. Awaiting ride. Will cont to monitor.

## 2019-02-22 NOTE — Discharge Instructions (Signed)
INSTRUCTIONS AFTER JOINT REPLACEMENT  ° °o Remove items at home which could result in a fall. This includes throw rugs or furniture in walking pathways °o ICE to the affected joint every three hours while awake for 30 minutes at a time, for at least the first 3-5 days, and then as needed for pain and swelling.  Continue to use ice for pain and swelling. You may notice swelling that will progress down to the foot and ankle.  This is normal after surgery.  Elevate your leg when you are not up walking on it.   °o Continue to use the breathing machine you got in the hospital (incentive spirometer) which will help keep your temperature down.  It is common for your temperature to cycle up and down following surgery, especially at night when you are not up moving around and exerting yourself.  The breathing machine keeps your lungs expanded and your temperature down. ° ° °DIET:  As you were doing prior to hospitalization, we recommend a well-balanced diet. ° °DRESSING / WOUND CARE / SHOWERING ° °Keep the surgical dressing until follow up.  The dressing is water proof, so you can shower without any extra covering.  IF THE DRESSING FALLS OFF or the wound gets wet inside, change the dressing with sterile gauze.  Please use good hand washing techniques before changing the dressing.  Do not use any lotions or creams on the incision until instructed by your surgeon.   ° °ACTIVITY ° °o Increase activity slowly as tolerated, but follow the weight bearing instructions below.   °o No driving for 6 weeks or until further direction given by your physician.  You cannot drive while taking narcotics.  °o No lifting or carrying greater than 10 lbs. until further directed by your surgeon. °o Avoid periods of inactivity such as sitting longer than an hour when not asleep. This helps prevent blood clots.  °o You may return to work once you are authorized by your doctor.  ° ° ° °WEIGHT BEARING  ° °Weight bearing as tolerated with assist  device (walker, cane, etc) as directed, use it as long as suggested by your surgeon or therapist, typically at least 4-6 weeks. ° ° °EXERCISES ° °Results after joint replacement surgery are often greatly improved when you follow the exercise, range of motion and muscle strengthening exercises prescribed by your doctor. Safety measures are also important to protect the joint from further injury. Any time any of these exercises cause you to have increased pain or swelling, decrease what you are doing until you are comfortable again and then slowly increase them. If you have problems or questions, call your caregiver or physical therapist for advice.  ° °Rehabilitation is important following a joint replacement. After just a few days of immobilization, the muscles of the leg can become weakened and shrink (atrophy).  These exercises are designed to build up the tone and strength of the thigh and leg muscles and to improve motion. Often times heat used for twenty to thirty minutes before working out will loosen up your tissues and help with improving the range of motion but do not use heat for the first two weeks following surgery (sometimes heat can increase post-operative swelling).  ° °These exercises can be done on a training (exercise) mat, on the floor, on a table or on a bed. Use whatever works the best and is most comfortable for you.    Use music or television while you are exercising so that   the exercises are a pleasant break in your day. This will make your life better with the exercises acting as a break in your routine that you can look forward to.   Perform all exercises about fifteen times, three times per day or as directed.  You should exercise both the operative leg and the other leg as well. ° °Exercises include: °  °• Quad Sets - Tighten up the muscle on the front of the thigh (Quad) and hold for 5-10 seconds.   °• Straight Leg Raises - With your knee straight (if you were given a brace, keep it on),  lift the leg to 60 degrees, hold for 3 seconds, and slowly lower the leg.  Perform this exercise against resistance later as your leg gets stronger.  °• Leg Slides: Lying on your back, slowly slide your foot toward your buttocks, bending your knee up off the floor (only go as far as is comfortable). Then slowly slide your foot back down until your leg is flat on the floor again.  °• Angel Wings: Lying on your back spread your legs to the side as far apart as you can without causing discomfort.  °• Hamstring Strength:  Lying on your back, push your heel against the floor with your leg straight by tightening up the muscles of your buttocks.  Repeat, but this time bend your knee to a comfortable angle, and push your heel against the floor.  You may put a pillow under the heel to make it more comfortable if necessary.  ° °A rehabilitation program following joint replacement surgery can speed recovery and prevent re-injury in the future due to weakened muscles. Contact your doctor or a physical therapist for more information on knee rehabilitation.  ° ° °CONSTIPATION ° °Constipation is defined medically as fewer than three stools per week and severe constipation as less than one stool per week.  Even if you have a regular bowel pattern at home, your normal regimen is likely to be disrupted due to multiple reasons following surgery.  Combination of anesthesia, postoperative narcotics, change in appetite and fluid intake all can affect your bowels.  ° °YOU MUST use at least one of the following options; they are listed in order of increasing strength to get the job done.  They are all available over the counter, and you may need to use some, POSSIBLY even all of these options:   ° °Drink plenty of fluids (prune juice may be helpful) and high fiber foods °Colace 100 mg by mouth twice a day  °Senokot for constipation as directed and as needed Dulcolax (bisacodyl), take with full glass of water  °Miralax (polyethylene glycol)  once or twice a day as needed. ° °If you have tried all these things and are unable to have a bowel movement in the first 3-4 days after surgery call either your surgeon or your primary doctor.   ° °If you experience loose stools or diarrhea, hold the medications until you stool forms back up.  If your symptoms do not get better within 1 week or if they get worse, check with your doctor.  If you experience "the worst abdominal pain ever" or develop nausea or vomiting, please contact the office immediately for further recommendations for treatment. ° ° °ITCHING:  If you experience itching with your medications, try taking only a single pain pill, or even half a pain pill at a time.  You can also use Benadryl over the counter for itching or also to   help with sleep.  ° °TED HOSE STOCKINGS:  Use stockings on both legs until for at least 2 weeks or as directed by physician office. They may be removed at night for sleeping. ° °MEDICATIONS:  See your medication summary on the “After Visit Summary” that nursing will review with you.  You may have some home medications which will be placed on hold until you complete the course of blood thinner medication.  It is important for you to complete the blood thinner medication as prescribed. ° °PRECAUTIONS:  If you experience chest pain or shortness of breath - call 911 immediately for transfer to the hospital emergency department.  ° °If you develop a fever greater that 101 F, purulent drainage from wound, increased redness or drainage from wound, foul odor from the wound/dressing, or calf pain - CONTACT YOUR SURGEON.   °                                                °FOLLOW-UP APPOINTMENTS:  If you do not already have a post-op appointment, please call the office for an appointment to be seen by your surgeon.  Guidelines for how soon to be seen are listed in your “After Visit Summary”, but are typically between 1-4 weeks after surgery. ° °OTHER INSTRUCTIONS:  ° °Knee  Replacement:  Do not place pillow under knee, focus on keeping the knee straight while resting. CPM instructions: 0-90 degrees, 2 hours in the morning, 2 hours in the afternoon, and 2 hours in the evening. Place foam block, curve side up under heel at all times except when in CPM or when walking.  DO NOT modify, tear, cut, or change the foam block in any way. ° °MAKE SURE YOU:  °• Understand these instructions.  °• Get help right away if you are not doing well or get worse.  ° ° °Thank you for letting us be a part of your medical care team.  It is a privilege we respect greatly.  We hope these instructions will help you stay on track for a fast and full recovery!  ° °Information on my medicine - XARELTO® (Rivaroxaban) ° °This medication education was reviewed with me or my healthcare representative as part of my discharge preparation. ° °Why was Xarelto® prescribed for you? °Xarelto® was prescribed for you to reduce the risk of blood clots forming after orthopedic surgery. The medical term for these abnormal blood clots is venous thromboembolism (VTE). ° °What do you need to know about xarelto® ? °Take your Xarelto® ONCE DAILY at the same time every day. °You may take it either with or without food. ° °If you have difficulty swallowing the tablet whole, you may crush it and mix in applesauce just prior to taking your dose. ° °Take Xarelto® exactly as prescribed by your doctor and DO NOT stop taking Xarelto® without talking to the doctor who prescribed the medication.  Stopping without other VTE prevention medication to take the place of Xarelto® may increase your risk of developing a clot. ° °After discharge, you should have regular check-up appointments with your healthcare provider that is prescribing your Xarelto®.   ° °What do you do if you miss a dose? °If you miss a dose, take it as soon as you remember on the same day then continue your regularly scheduled once daily regimen the next day. Do   not take two  doses of Xarelto® on the same day.  ° °Important Safety Information °A possible side effect of Xarelto® is bleeding. You should call your healthcare provider right away if you experience any of the following: °? Bleeding from an injury or your nose that does not stop. °? Unusual colored urine (red or dark brown) or unusual colored stools (red or black). °? Unusual bruising for unknown reasons. °? A serious fall or if you hit your head (even if there is no bleeding). ° °Some medicines may interact with Xarelto® and might increase your risk of bleeding while on Xarelto®. To help avoid this, consult your healthcare provider or pharmacist prior to using any new prescription or non-prescription medications, including herbals, vitamins, non-steroidal anti-inflammatory drugs (NSAIDs) and supplements. ° °This website has more information on Xarelto®: www.xarelto.com. ° ° ° ° °

## 2019-02-22 NOTE — Progress Notes (Signed)
Physical Therapy Treatment Patient Details Name: Garrett Moore MRN: [id number] DOB: 1941/03/20 Today's Date: 02/22/2019    History of Present Illness Pt is 78 y.o. male s/p Lt THA anterior approach on 02/21/19 with PMH significant for MI, HLD< COPD, CAD, CKD, and OA.    PT Comments    Pt ambulated in hallway and performed LE exercises.  Pt provided with HEP handout and had no further questions.  Pt feels ready for d/c home today.    Follow Up Recommendations  Follow surgeon's recommendation for DC plan and follow-up therapies     Equipment Recommendations  3in1 (PT);Rolling walker with 5" wheels    Recommendations for Other Services       Precautions / Restrictions Precautions Precautions: Fall Restrictions Weight Bearing Restrictions: No    Mobility  Bed Mobility Overal bed mobility: Needs Assistance             General bed mobility comments: pt up in recliner  Transfers Overall transfer level: Needs assistance Equipment used: Rolling walker (2 wheeled) Transfers: Sit to/from Stand Sit to Stand: Min guard         General transfer comment: cues for hand placement and technique with RW  Ambulation/Gait Ambulation/Gait assistance: Min guard Gait Distance (Feet): 160 Feet Assistive device: Rolling walker (2 wheeled) Gait Pattern/deviations: Step-through pattern;Decreased stride length;Antalgic     General Gait Details: verbal cues for sequence, RW positioning, step length, steady with RW   Stairs Stairs: (plans to use ramp to enter home)           Wheelchair Mobility    Modified Rankin (Stroke Patients Only)       Balance                                            Cognition Arousal/Alertness: Awake/alert Behavior During Therapy: WFL for tasks assessed/performed Overall Cognitive Status: Within Functional Limits for tasks assessed                                        Exercises Total Joint  Exercises Ankle Circles/Pumps: AROM;Both;10 reps Quad Sets: AROM;Both;10 reps Heel Slides: AAROM;Left;10 reps Hip ABduction/ADduction: AROM;Standing;10 reps;Left Long Arc Quad: AROM;Left;10 reps;Seated Knee Flexion: AROM;Left;Standing;10 reps Marching in Standing: AROM;Left;10 reps;Standing Standing Hip Extension: AROM;Standing;Left;10 reps    General Comments        Pertinent Vitals/Pain Pain Assessment: 0-10 Pain Score: 3  Pain Location: Left hip Pain Descriptors / Indicators: Aching;Sore Pain Intervention(s): Repositioned;Monitored during session    Home Living                      Prior Function            PT Goals (current goals can now be found in the care plan section) Progress towards PT goals: Progressing toward goals    Frequency    7X/week      PT Plan Current plan remains appropriate    Co-evaluation              AM-PAC PT "6 Clicks" Mobility   Outcome Measure  Help needed turning from your back to your side while in a flat bed without using bedrails?: A Little Help needed moving from lying on your back to sitting on the side of  a flat bed without using bedrails?: A Little Help needed moving to and from a bed to a chair (including a wheelchair)?: A Little Help needed standing up from a chair using your arms (e.g., wheelchair or bedside chair)?: A Little Help needed to walk in hospital room?: A Little Help needed climbing 3-5 steps with a railing? : A Little 6 Click Score: 18    End of Session Equipment Utilized During Treatment: Gait belt Activity Tolerance: Patient tolerated treatment well Patient left: in chair;with call bell/phone within reach Nurse Communication: Mobility status PT Visit Diagnosis: Muscle weakness (generalized) (M62.81);Difficulty in walking, not elsewhere classified (R26.2)     Time: [id number] PT Time Calculation (min) (ACUTE ONLY): 21 min  Charges:  $Therapeutic Exercise: 8-22 mins                     Carmelia Bake, PT, DPT Acute Rehabilitation Services Office: [phone] Pager: [phone]  Trena Platt 02/22/2019, 11:58 AM

## 2019-02-28 NOTE — Discharge Summary (Signed)
Physician Discharge Summary  Patient ID: Garrett Moore MRN: [id number] DOB/AGE: 23-Nov-1940 78 y.o.  Admit date: 02/21/2019 Discharge date: 02/22/2019   Procedures:  Procedure(s) (LRB): TOTAL HIP ARTHROPLASTY ANTERIOR APPROACH (Left)  Attending Physician:  Dr. Paralee Cancel   Admission Diagnoses:   Left hip primary OA / pain  Discharge Diagnoses:  Active Problems:   S/P left THA, AA   Status post total hip replacement, left  Past Medical History:  Diagnosis Date   Aortic valvar stenosis 02/08/2018   Mild to Moderate Noted on ECHO   Basal cell carcinoma    Face   CKD (chronic kidney disease), stage III    COPD (chronic obstructive pulmonary disease) (HCC)    Coronary artery disease    DVT (deep venous thrombosis) ([place])    age 78, leg after Tonsillectomy   Grade I diastolic dysfunction [id number]   Noted on ECHO   Heart murmur    Hyperlipidemia    Myocardial infarction [facility] 07/1983   OA (osteoarthritis)     HPI:    Garrett Moore, 78 y.o. male, has a history of pain and functional disability in the left hip(s) due to arthritis and patient has failed non-surgical conservative treatments for greater than 12 weeks to include NSAID's and/or analgesics, corticosteriod injections and activity modification.  Onset of symptoms was gradual starting 2-3 years ago with gradually worsening course since that time.The patient noted no past surgery on the left hip(s).  Patient currently rates pain in the left hip at 8 out of 10 with activity. Patient has worsening of pain with activity and weight bearing, trendelenberg gait, pain that interfers with activities of daily living and pain with passive range of motion. Patient has evidence of periarticular osteophytes and joint space narrowing by imaging studies. This condition presents safety issues increasing the risk of falls.  There is no current active infection.  Risks, benefits and expectations were discussed with the patient.   Risks including but not limited to the risk of anesthesia, blood clots, nerve damage, blood vessel damage, failure of the prosthesis, infection and up to and including death.  Patient understand the risks, benefits and expectations and wishes to proceed with surgery.   PCP: Garrett Cruel, MD   Discharged Condition: good  Hospital Course:  Patient underwent the above stated procedure on 02/21/2019. Patient tolerated the procedure well and brought to the recovery room in good condition and subsequently to the floor.  POD #1 BP: 127/66 ; Pulse: 87 ; Temp: 97.6 F (36.4 C) ; Resp: 16 Patient reports pain as mild, pain controlled. No events throughout the night. Dr. Alvan Dame discussed the procedure, findings and expectations moving forward.  Patient will be discharged home. Dorsiflexion/plantar flexion intact, incision: dressing C/D/I, no cellulitis present and compartment soft.   LABS  Basename    HGB     12.4  HCT     38.3    Discharge Exam: General appearance: alert, cooperative and no distress Extremities: Homans sign is negative, no sign of DVT, no edema, redness or tenderness in the calves or thighs and no ulcers, gangrene or trophic changes  Disposition:  Home with follow up in 2 weeks   Follow-up Information    Paralee Cancel, MD. Schedule an appointment as soon as possible for a visit in 2 weeks.   Specialty: Orthopedic Surgery Contact information: [address] [place] [address] [id number]           Discharge Instructions  Call MD / Call 911   Complete by: As directed    If you experience chest pain or shortness of breath, CALL 911 and be transported to the hospital emergency room.  If you develope a fever above 101 F, pus (white drainage) or increased drainage or redness at the wound, or calf pain, call your surgeon's office.   Change dressing   Complete by: As directed    Maintain surgical dressing until follow up in the clinic. If the edges  start to pull up, may reinforce with tape. If the dressing is no longer working, may remove and cover with gauze and tape, but must keep the area dry and clean.  Call with any questions or concerns.   Constipation Prevention   Complete by: As directed    Drink plenty of fluids.  Prune juice may be helpful.  You may use a stool softener, such as Colace (over the counter) 100 mg twice a day.  Use MiraLax (over the counter) for constipation as needed.   Diet - low sodium heart healthy   Complete by: As directed    Discharge instructions   Complete by: As directed    Maintain surgical dressing until follow up in the clinic. If the edges start to pull up, may reinforce with tape. If the dressing is no longer working, may remove and cover with gauze and tape, but must keep the area dry and clean.  Follow up in 2 [facility] at [facility]. Call with any questions or concerns.   Increase activity slowly as tolerated   Complete by: As directed    Weight bearing as tolerated with assist device (walker, cane, etc) as directed, use it as long as suggested by your surgeon or therapist, typically at least 4-6 weeks.   TED hose   Complete by: As directed    Use stockings (TED hose) for 2 weeks on both leg(s).  You may remove them at night for sleeping.      Allergies as of 02/22/2019   No Known Allergies     Medication List    STOP taking these medications   acetaminophen 500 MG tablet Commonly known as: TYLENOL   aspirin 81 MG tablet Replaced by: aspirin 81 MG chewable tablet   multivitamin tablet     TAKE these medications   aspirin 81 MG chewable tablet Commonly known as: Aspirin Childrens Chew 1 tablet (81 mg total) by mouth 2 (two) times daily. Start the day after finishing Xarelto. Take for 4 weeks, then resume regular dose. Start taking on: March 09, 2019 Replaces: aspirin 81 MG tablet   docusate sodium 100 MG capsule Commonly known as: Colace Take 1 capsule (100 mg total)  by mouth 2 (two) times daily.   ferrous sulfate 325 (65 FE) MG tablet Commonly known as: FerrouSul Take 1 tablet (325 mg total) by mouth 3 (three) times daily with meals for 14 days.   HYDROcodone-acetaminophen 5-325 MG tablet Commonly known as: Norco Take 1-2 tablets by mouth every 6 (six) hours as needed for moderate pain or severe pain.   methocarbamol 500 MG tablet Commonly known as: Robaxin Take 1 tablet (500 mg total) by mouth every 6 (six) hours as needed for muscle spasms.   nicotine polacrilex 4 MG gum Commonly known as: NICORETTE Take 2 mg by mouth daily.   polyethylene glycol 17 g packet Commonly known as: MIRALAX / GLYCOLAX Take 17 g by mouth 2 (two) times daily.   prednisoLONE acetate 1 %  ophthalmic suspension Commonly known as: PRED FORTE Place 1 drop into the right eye 2 (two) times daily.   rivaroxaban 10 MG Tabs tablet Commonly known as: Xarelto Take 1 tablet (10 mg total) by mouth daily for 14 days.   simvastatin 80 MG tablet Commonly known as: ZOCOR Take 80 mg by mouth daily at 6 PM.            Discharge Care Instructions  (From admission, onward)         Start     Ordered   02/21/19 0000  Change dressing    Comments: Maintain surgical dressing until follow up in the clinic. If the edges start to pull up, may reinforce with tape. If the dressing is no longer working, may remove and cover with gauze and tape, but must keep the area dry and clean.  Call with any questions or concerns.   02/21/19 1046           Signed: [place] Pugh. Caelan Atchley   PA-C  02/28/2019, 8:21 AM

## 2019-03-29 DIAGNOSIS — R7303 Prediabetes: Secondary | ICD-10-CM | POA: Diagnosis not present

## 2019-03-29 DIAGNOSIS — E782 Mixed hyperlipidemia: Secondary | ICD-10-CM | POA: Diagnosis not present

## 2019-03-29 DIAGNOSIS — Z79899 Other long term (current) drug therapy: Secondary | ICD-10-CM | POA: Diagnosis not present

## 2019-04-10 DIAGNOSIS — Z471 Aftercare following joint replacement surgery: Secondary | ICD-10-CM | POA: Diagnosis not present

## 2019-04-10 DIAGNOSIS — Z96642 Presence of left artificial hip joint: Secondary | ICD-10-CM | POA: Diagnosis not present

## 2019-04-12 ENCOUNTER — Ambulatory Visit: Payer: Medicare HMO

## 2019-04-13 ENCOUNTER — Ambulatory Visit: Payer: Medicare HMO | Attending: Internal Medicine

## 2019-04-13 DIAGNOSIS — Z23 Encounter for immunization: Secondary | ICD-10-CM | POA: Insufficient documentation

## 2019-04-17 DIAGNOSIS — N400 Enlarged prostate without lower urinary tract symptoms: Secondary | ICD-10-CM | POA: Diagnosis not present

## 2019-04-17 DIAGNOSIS — E782 Mixed hyperlipidemia: Secondary | ICD-10-CM | POA: Diagnosis not present

## 2019-04-17 DIAGNOSIS — J449 Chronic obstructive pulmonary disease, unspecified: Secondary | ICD-10-CM | POA: Diagnosis not present

## 2019-04-17 DIAGNOSIS — Z79899 Other long term (current) drug therapy: Secondary | ICD-10-CM | POA: Diagnosis not present

## 2019-04-17 DIAGNOSIS — N183 Chronic kidney disease, stage 3 unspecified: Secondary | ICD-10-CM | POA: Diagnosis not present

## 2019-04-17 DIAGNOSIS — Z125 Encounter for screening for malignant neoplasm of prostate: Secondary | ICD-10-CM | POA: Diagnosis not present

## 2019-04-17 DIAGNOSIS — Z0000 Encounter for general adult medical examination without abnormal findings: Secondary | ICD-10-CM | POA: Diagnosis not present

## 2019-04-26 DIAGNOSIS — H52209 Unspecified astigmatism, unspecified eye: Secondary | ICD-10-CM | POA: Diagnosis not present

## 2019-04-26 DIAGNOSIS — H5213 Myopia, bilateral: Secondary | ICD-10-CM | POA: Diagnosis not present

## 2019-04-26 DIAGNOSIS — H524 Presbyopia: Secondary | ICD-10-CM | POA: Diagnosis not present

## 2019-05-04 ENCOUNTER — Ambulatory Visit: Payer: Medicare HMO | Attending: Internal Medicine

## 2019-05-04 DIAGNOSIS — Z23 Encounter for immunization: Secondary | ICD-10-CM | POA: Insufficient documentation

## 2019-05-04 NOTE — Progress Notes (Signed)
   Covid-19 Vaccination Clinic  Name:  Garrett Moore    MRN: [id number] DOB: 1940/10/21  05/04/2019  [name] was observed post Covid-19 immunization for 15 minutes without incidence. He was provided with Vaccine Information Sheet and instruction to access the V-Safe system.   [name] was instructed to call 911 with any severe reactions post vaccine: Marland Kitchen Difficulty breathing  . Swelling of your face and throat  . A fast heartbeat  . A bad rash all over your body  . Dizziness and weakness    Immunizations Administered    Name Date Dose VIS Date Route   Pfizer COVID-19 Vaccine 05/04/2019 11:56 AM 0.3 mL 03/03/2019 Intramuscular   Manufacturer: [place]   Lot: [id number]   [place]: [id number]

## 2019-06-01 DIAGNOSIS — H35372 Puckering of macula, left eye: Secondary | ICD-10-CM | POA: Diagnosis not present

## 2019-06-01 DIAGNOSIS — H35341 Macular cyst, hole, or pseudohole, right eye: Secondary | ICD-10-CM | POA: Diagnosis not present

## 2019-07-11 DIAGNOSIS — L570 Actinic keratosis: Secondary | ICD-10-CM | POA: Diagnosis not present

## 2019-07-11 DIAGNOSIS — Z85828 Personal history of other malignant neoplasm of skin: Secondary | ICD-10-CM | POA: Diagnosis not present

## 2019-07-11 DIAGNOSIS — L738 Other specified follicular disorders: Secondary | ICD-10-CM | POA: Diagnosis not present

## 2019-07-11 DIAGNOSIS — X32XXXD Exposure to sunlight, subsequent encounter: Secondary | ICD-10-CM | POA: Diagnosis not present

## 2019-07-11 DIAGNOSIS — B078 Other viral warts: Secondary | ICD-10-CM | POA: Diagnosis not present

## 2019-07-11 DIAGNOSIS — L98499 Non-pressure chronic ulcer of skin of other sites with unspecified severity: Secondary | ICD-10-CM | POA: Diagnosis not present

## 2019-07-11 DIAGNOSIS — Z08 Encounter for follow-up examination after completed treatment for malignant neoplasm: Secondary | ICD-10-CM | POA: Diagnosis not present

## 2020-02-23 DIAGNOSIS — M7061 Trochanteric bursitis, right hip: Secondary | ICD-10-CM | POA: Diagnosis not present

## 2020-02-23 DIAGNOSIS — M1611 Unilateral primary osteoarthritis, right hip: Secondary | ICD-10-CM | POA: Diagnosis not present

## 2020-02-23 DIAGNOSIS — Z96642 Presence of left artificial hip joint: Secondary | ICD-10-CM | POA: Diagnosis not present

## 2020-03-12 DIAGNOSIS — R1110 Vomiting, unspecified: Secondary | ICD-10-CM | POA: Diagnosis not present

## 2020-03-12 DIAGNOSIS — B349 Viral infection, unspecified: Secondary | ICD-10-CM | POA: Diagnosis not present

## 2020-03-12 DIAGNOSIS — Z03818 Encounter for observation for suspected exposure to other biological agents ruled out: Secondary | ICD-10-CM | POA: Diagnosis not present

## 2020-03-12 DIAGNOSIS — R197 Diarrhea, unspecified: Secondary | ICD-10-CM | POA: Diagnosis not present

## 2020-04-10 DIAGNOSIS — Z125 Encounter for screening for malignant neoplasm of prostate: Secondary | ICD-10-CM | POA: Diagnosis not present

## 2020-04-10 DIAGNOSIS — N183 Chronic kidney disease, stage 3 unspecified: Secondary | ICD-10-CM | POA: Diagnosis not present

## 2020-04-10 DIAGNOSIS — B349 Viral infection, unspecified: Secondary | ICD-10-CM | POA: Diagnosis not present

## 2020-04-10 DIAGNOSIS — N400 Enlarged prostate without lower urinary tract symptoms: Secondary | ICD-10-CM | POA: Diagnosis not present

## 2020-04-10 DIAGNOSIS — R197 Diarrhea, unspecified: Secondary | ICD-10-CM | POA: Diagnosis not present

## 2020-04-10 DIAGNOSIS — R1110 Vomiting, unspecified: Secondary | ICD-10-CM | POA: Diagnosis not present

## 2020-04-10 DIAGNOSIS — E782 Mixed hyperlipidemia: Secondary | ICD-10-CM | POA: Diagnosis not present

## 2020-04-10 DIAGNOSIS — Z0000 Encounter for general adult medical examination without abnormal findings: Secondary | ICD-10-CM | POA: Diagnosis not present

## 2020-04-10 DIAGNOSIS — Z79899 Other long term (current) drug therapy: Secondary | ICD-10-CM | POA: Diagnosis not present

## 2020-04-17 DIAGNOSIS — J449 Chronic obstructive pulmonary disease, unspecified: Secondary | ICD-10-CM | POA: Diagnosis not present

## 2020-04-17 DIAGNOSIS — N400 Enlarged prostate without lower urinary tract symptoms: Secondary | ICD-10-CM | POA: Diagnosis not present

## 2020-04-17 DIAGNOSIS — N183 Chronic kidney disease, stage 3 unspecified: Secondary | ICD-10-CM | POA: Diagnosis not present

## 2020-04-17 DIAGNOSIS — Z0000 Encounter for general adult medical examination without abnormal findings: Secondary | ICD-10-CM | POA: Diagnosis not present

## 2020-04-17 DIAGNOSIS — Z79899 Other long term (current) drug therapy: Secondary | ICD-10-CM | POA: Diagnosis not present

## 2020-04-17 DIAGNOSIS — E782 Mixed hyperlipidemia: Secondary | ICD-10-CM | POA: Diagnosis not present

## 2020-04-24 ENCOUNTER — Encounter: Payer: Self-pay | Admitting: Urology

## 2020-04-24 ENCOUNTER — Other Ambulatory Visit: Payer: Self-pay

## 2020-04-24 ENCOUNTER — Ambulatory Visit: Payer: Medicare HMO | Admitting: Urology

## 2020-04-24 VITALS — BP 136/75 | HR 84 | Ht 71.0 in | Wt 191.0 lb

## 2020-04-24 DIAGNOSIS — N138 Other obstructive and reflux uropathy: Secondary | ICD-10-CM | POA: Diagnosis not present

## 2020-04-24 DIAGNOSIS — N401 Enlarged prostate with lower urinary tract symptoms: Secondary | ICD-10-CM | POA: Diagnosis not present

## 2020-04-24 DIAGNOSIS — R3129 Other microscopic hematuria: Secondary | ICD-10-CM

## 2020-04-24 DIAGNOSIS — N400 Enlarged prostate without lower urinary tract symptoms: Secondary | ICD-10-CM | POA: Diagnosis not present

## 2020-04-24 LAB — BLADDER SCAN AMB NON-IMAGING

## 2020-04-24 NOTE — Patient Instructions (Signed)
Transrectal Ultrasound A transrectal ultrasound is a procedure that uses sound waves to create images of the prostate gland and nearby tissues. For this procedure, an ultrasound probe is placed in the rectum. The probe sends sound waves through the wall of the rectum into the prostate gland, which is located in front of the rectum. The images show the size and shape of the prostate gland and nearby structures. You may need this test if you have:  Trouble urinating.  Trouble getting your partner pregnant (infertility).  An abnormal result from a prostate screening exam. Tell a health care provider about:  Any allergies you have.  All medicines you are taking, including vitamins, herbs, eye drops, creams, and over-the-counter medicines.  Any blood disorders you have.  Any medical conditions you have.  Any surgeries you have had. What are the risks? Generally, this is a safe procedure. However, problems may occur, including:  Discomfort during the procedure. This is rare.  Blood in your urine or sperm after the procedure. This may occur if a sample of tissue (biopsy) is taken during the procedure. What happens before the procedure?  Your health care provider may instruct you to use an enema 1-4 hours before the procedure. Follow instructions from your health care provider about how to do the enema.  Ask your health care provider about: ? Changing or stopping your regular medicines. This is especially important if you are taking diabetes medicines or blood thinners. ? Taking medicines such as aspirin and ibuprofen. These medicines can thin your blood. Do not take these medicines unless your health care provider tells you to take them. ? Taking over-the-counter medicines, vitamins, herbs, and supplements. What happens during the procedure?  You will be asked to lie down on your left side on an exam table.  You will bend your knees toward your chest.  Gel will be put on a probe, and  the probe will be gently inserted into your rectum. This may cause a feeling of fullness.  The probe will send signals to a computer that will create images. These will be displayed on a monitor that looks like a small television screen.  The technician will slightly rotate the probe throughout the procedure. While rotating the probe, he or she will view and capture images of the prostate gland and the surrounding structures from different angles.  Your health care provider may take a biopsy sample of prostate tissue during the procedure. The images captured from the ultrasound will help guide the needle used to remove a sample of tissue. The sample will be sent to a lab for testing.  The probe will be removed. The procedure may vary among health care providers and hospitals. What can I expect after the procedure?  It is up to you to get the results of your procedure. Ask your health care provider, or the department that is doing the procedure, when your results will be ready.  Keep all follow-up visits as told by your health care provider. This is important. Summary  A transrectal ultrasound is a procedure that uses sound waves to create images of the prostate gland and nearby tissues.  The images show the size and shape of the prostate gland and nearby structures.  Before the procedure, ask your health care provider about changing or stopping your regular medicines. This is especially important if you are taking diabetes medicines or blood thinners. This information is not intended to replace advice given to you by your health care provider.   Make sure you discuss any questions you have with your health care provider. Document Revised: 05/03/2019 Document Reviewed: 05/03/2019 Elsevier Patient Education  2021 Elsevier Inc.   Cystoscopy Cystoscopy is a procedure that is used to help diagnose and sometimes treat conditions that affect the lower urinary tract. The lower urinary tract  includes the bladder and the urethra. The urethra is the tube that drains urine from the bladder. Cystoscopy is done using a thin, tube-shaped instrument with a light and camera at the end (cystoscope). The cystoscope may be hard or flexible, depending on the goal of the procedure. The cystoscope is inserted through the urethra, into the bladder. Cystoscopy may be recommended if you have:  Urinary tract infections that keep coming back.  Blood in the urine (hematuria).  An inability to control when you urinate (urinary incontinence) or an overactive bladder.  Unusual cells found in a urine sample.  A blockage in the urethra, such as a urinary stone.  Painful urination.  An abnormality in the bladder found during an intravenous pyelogram (IVP) or CT scan. Cystoscopy may also be done to remove a sample of tissue to be examined under a microscope (biopsy). What are the risks? Generally, this is a safe procedure. However, problems may occur, including:  Infection.  Bleeding.  What happens during the procedure?  1. You will be given one or more of the following: ? A medicine to numb the area (local anesthetic). 2. The area around the opening of your urethra will be cleaned. 3. The cystoscope will be passed through your urethra into your bladder. 4. Germ-free (sterile) fluid will flow through the cystoscope to fill your bladder. The fluid will stretch your bladder so that your health care provider can clearly examine your bladder walls. 5. Your doctor will look at the urethra and bladder. 6. The cystoscope will be removed The procedure may vary among health care providers  What can I expect after the procedure? After the procedure, it is common to have: 1. Some soreness or pain in your abdomen and urethra. 2. Urinary symptoms. These include: ? Mild pain or burning when you urinate. Pain should stop within a few minutes after you urinate. This may last for up to 1 week. ? A small  amount of blood in your urine for several days. ? Feeling like you need to urinate but producing only a small amount of urine. Follow these instructions at home: General instructions  Return to your normal activities as told by your health care provider.   Do not drive for 24 hours if you were given a sedative during your procedure.  Watch for any blood in your urine. If the amount of blood in your urine increases, call your health care provider.  If a tissue sample was removed for testing (biopsy) during your procedure, it is up to you to get your test results. Ask your health care provider, or the department that is doing the test, when your results will be ready.  Drink enough fluid to keep your urine pale yellow.  Keep all follow-up visits as told by your health care provider. This is important. Contact a health care provider if you:  Have pain that gets worse or does not get better with medicine, especially pain when you urinate.  Have trouble urinating.  Have more blood in your urine. Get help right away if you:  Have blood clots in your urine.  Have abdominal pain.  Have a fever or chills.  Are unable   to urinate. Summary  Cystoscopy is a procedure that is used to help diagnose and sometimes treat conditions that affect the lower urinary tract.  Cystoscopy is done using a thin, tube-shaped instrument with a light and camera at the end.  After the procedure, it is common to have some soreness or pain in your abdomen and urethra.  Watch for any blood in your urine. If the amount of blood in your urine increases, call your health care provider.  If you were prescribed an antibiotic medicine, take it as told by your health care provider. Do not stop taking the antibiotic even if you start to feel better. This information is not intended to replace advice given to you by your health care provider. Make sure you discuss any questions you have with your health care  provider. Document Revised: 03/01/2018 Document Reviewed: 03/01/2018 Elsevier Patient Education  2020 Elsevier Inc.   

## 2020-04-24 NOTE — Progress Notes (Signed)
04/24/2020 10:58 AM   Garrett Moore 10-22-1940 [id number]  Referring provider: [name], MD [place],  [place] [id number]  Chief Complaint  Patient presents with  . Benign Prostatic Hypertrophy    HPI: 80 year old male who presents today for further evaluation of obstructive urinary symptoms.  Reports that over the past several years, has had progressive worsening urinary symptoms.  These include nocturia x3, weak stream, difficulty starting a stream, intermittency as his primary symptoms.  IPSS as below.  About a year ago, he tried Flomax for 30 days without any significant benefit.  He may be interested in a procedure such as UroLift to help with his urinary symptoms.  He denies a history of UTIs.  No gross hematuria.  Incidentally today, he does have microscopic hematuria.  He has never been told that he has microscopic blood in his urine.  He does have an extensive smoking history, smoked about 3 packs/day up until 15 years ago.  He is on aspirin and Xarelto chronically.   Results for orders placed or performed in visit on 04/24/20  Bladder Scan (Post Void Residual) in office  Result Value Ref Range   Scan Result 6mL       IPSS    Row Name 04/24/20 1000         International Prostate Symptom Score   How often have you had the sensation of not emptying your bladder? About half the time     How often have you had to urinate less than every two hours? More than half the time     How often have you found you stopped and started again several times when you urinated? About half the time     How often have you found it difficult to postpone urination? More than half the time     How often have you had a weak urinary stream? More than half the time     How often have you had to strain to start urination? About half the time     How many times did you typically get up at night to urinate? 3 Times     Total IPSS Score 24           Quality  of Life due to urinary symptoms   If you were to spend the rest of your life with your urinary condition just the way it is now how would you feel about that? Mostly Disatisfied            Score:  1-7 Mild 8-19 Moderate 20-35 Severe    PMH: Past Medical History:  Diagnosis Date  . Aortic valvar stenosis 02/08/2018   Mild to Moderate Noted on ECHO  . Basal cell carcinoma    Face  . CKD (chronic kidney disease), stage III ([place])   . COPD (chronic obstructive pulmonary disease) ([place])   . Coronary artery disease   . DVT (deep venous thrombosis) ([place])    age 9, leg after Tonsillectomy  . Grade I diastolic dysfunction [id number]   Noted on ECHO  . Heart murmur   . Hyperlipidemia   . Myocardial infarction ([place]) 07/1983  . OA (osteoarthritis)     Surgical History: Past Surgical History:  Procedure Laterality Date  . CARDIAC CATHETERIZATION  08/14/1983  . CARDIOVASCULAR STRESS TEST  01/23/2011   normal myocardial perfusion scan demonstrating an attenuation artifact in the inferior region of the myocardium. no ischemia or infarct/scar seen in remaining myocardium  .  CAROTID DUPLEX  05/21/2006   right bulb-0-49% diameter reduction; left bulb and proximal ICA-0-49% diameter reduction  . CATARACT EXTRACTION, BILATERAL  01/2019  . CORONARY ANGIOPLASTY  07/1983   PTCA Prox RCA  . DOPPLER ECHOCARDIOGRAPHY  07/21/2012   EF 50-55%, aortic valve noncoronary cusp mobility was severely restricted.  Marland Kitchen ROTATOR CUFF REPAIR Left   . TEMPORARY PACEMAKER  07/1983  . TONSILLECTOMY    . TOTAL HIP ARTHROPLASTY Left 02/21/2019   Procedure: TOTAL HIP ARTHROPLASTY ANTERIOR APPROACH;  Surgeon: Paralee Cancel, MD;  Location: [facility];  Service: Orthopedics;  Laterality: Left;  70 mins    Home Medications:  Allergies as of 04/24/2020   No Known Allergies     Medication List       Accurate as of April 24, 2020 10:58 AM. If you have any questions, ask your nurse or doctor.        docusate sodium 100  MG capsule Commonly known as: Colace Take 1 capsule (100 mg total) by mouth 2 (two) times daily.   ferrous sulfate 325 (65 FE) MG tablet Commonly known as: FerrouSul Take 1 tablet (325 mg total) by mouth 3 (three) times daily with meals for 14 days.   HYDROcodone-acetaminophen 5-325 MG tablet Commonly known as: Norco Take 1-2 tablets by mouth every 6 (six) hours as needed for moderate pain or severe pain.   methocarbamol 500 MG tablet Commonly known as: Robaxin Take 1 tablet (500 mg total) by mouth every 6 (six) hours as needed for muscle spasms.   nicotine polacrilex 4 MG gum Commonly known as: NICORETTE Take 2 mg by mouth daily.   polyethylene glycol 17 g packet Commonly known as: MIRALAX / GLYCOLAX Take 17 g by mouth 2 (two) times daily.   prednisoLONE acetate 1 % ophthalmic suspension Commonly known as: PRED FORTE Place 1 drop into the right eye 2 (two) times daily.   rivaroxaban 10 MG Tabs tablet Commonly known as: Xarelto Take 1 tablet (10 mg total) by mouth daily for 14 days.   simvastatin 80 MG tablet Commonly known as: ZOCOR Take 80 mg by mouth daily at 6 PM.       Allergies: No Known Allergies  Family History: Family History  Problem Relation Age of Onset  . Heart Problems Mother   . AAA (abdominal aortic aneurysm) Mother     Social History:  reports that he has quit smoking. His smoking use included cigarettes. He has a 100.00 pack-year smoking history. He has never used smokeless tobacco. He reports previous alcohol use. He reports that he does not use drugs.   Physical Exam: BP 136/75   Pulse 84   Ht 5\' 11"  (1.803 m)   Wt 191 lb (86.6 kg)   BMI 26.64 kg/m   Constitutional:  Alert and oriented, No acute distress. HEENT: [place] AT, moist mucus membranes.  Trachea midline, no masses. Cardiovascular: No clubbing, cyanosis, or edema. Respiratory: Normal respiratory effort, no increased work of breathing.. Skin: No rashes, bruises or suspicious  lesions. Neurologic: Grossly intact, no focal deficits, moving all 4 extremities. Psychiatric: Normal mood and affect.  Laboratory Data: Lab Results  Component Value Date   CREATININE 1.29 (H) 02/22/2019    Urinalysis + Microscopic hematuria with a few WBCs, see epic  Assessment & Plan:    1. Benign prostatic hyperplasia with urinary obstruction Worsening primarily obstructive urinary symptoms over the past several years with also some irritative urinary symptoms consistent with BPH  Check a PSA today primarily  to rule out advanced metastatic prostate cancer although not suspected.  If it is mildly elevated, we will not pursue further work-up.  Rectal exam at the time of TRUS.  Given that he is previously failed alpha blockers, we discussed options including finasteride versus consideration of outlet procedure.  He is most interested in UroLift given its minimally invasive approach.  Discussed the role of cystoscopy/transrectal ultrasound for prostate sizing to help define the anatomy and whether or not he would be a candidate for this.  He is agreeable this plan.  We will bring him back for this. - Urinalysis, Complete - Bladder Scan (Post Void Residual) in office - PSA - CULTURE, URINE COMPREHENSIVE  2. Microscopic hematuria Incidental microscopic hematuria, also has a few WBCs.  Urine culture rule out infection.  Given his extensive smoking history and presence of microscopic hematuria, I am strongly recommended a hematuria evaluation.  He is already agreed to cystoscopy as outlined above.  He is also agreeable to CT urogram given these fairly high risk.   - CT HEMATURIA WORKUP; Future - CULTURE, URINE COMPREHENSIVE  Follow-up CT urogram/ cystoscopy/ TRUS/rectal exam  Hollice Espy, MD  [place] [address] [place],  [id number] [phone]

## 2020-04-25 LAB — MICROSCOPIC EXAMINATION: Bacteria, UA: NONE SEEN

## 2020-04-25 LAB — URINALYSIS, COMPLETE
Bilirubin, UA: NEGATIVE
Glucose, UA: NEGATIVE
Ketones, UA: NEGATIVE
Nitrite, UA: NEGATIVE
Protein,UA: NEGATIVE
Specific Gravity, UA: 1.025 (ref 1.005–1.030)
Urobilinogen, Ur: 0.2 mg/dL (ref 0.2–1.0)
pH, UA: 5 (ref 5.0–7.5)

## 2020-04-25 LAB — PSA: Prostate Specific Ag, Serum: 2.3 ng/mL (ref 0.0–4.0)

## 2020-04-28 LAB — CULTURE, URINE COMPREHENSIVE

## 2020-05-10 ENCOUNTER — Ambulatory Visit
Admission: RE | Admit: 2020-05-10 | Discharge: 2020-05-10 | Disposition: A | Discharge status: Home / Self Care | Payer: Medicare HMO | Source: Home / Self Care | Attending: Urology | Admitting: Urology

## 2020-05-10 ENCOUNTER — Ambulatory Visit
Admission: RE | Admit: 2020-05-10 | Discharge: 2020-05-10 | Disposition: A | Discharge status: Home / Self Care | Payer: Medicare HMO | Source: Ambulatory Visit | Attending: Urology | Admitting: Urology

## 2020-05-10 ENCOUNTER — Other Ambulatory Visit: Payer: Self-pay

## 2020-05-10 DIAGNOSIS — N134 Hydroureter: Secondary | ICD-10-CM | POA: Diagnosis not present

## 2020-05-10 DIAGNOSIS — K8020 Calculus of gallbladder without cholecystitis without obstruction: Secondary | ICD-10-CM | POA: Diagnosis not present

## 2020-05-10 DIAGNOSIS — R3129 Other microscopic hematuria: Secondary | ICD-10-CM

## 2020-05-10 DIAGNOSIS — I7143 Infrarenal abdominal aortic aneurysm, without rupture: Secondary | ICD-10-CM

## 2020-05-10 DIAGNOSIS — N201 Calculus of ureter: Secondary | ICD-10-CM | POA: Diagnosis not present

## 2020-05-10 DIAGNOSIS — I714 Abdominal aortic aneurysm, without rupture: Secondary | ICD-10-CM | POA: Diagnosis not present

## 2020-05-10 HISTORY — DX: Abdominal aortic aneurysm, without rupture: I71.4

## 2020-05-10 HISTORY — DX: Infrarenal abdominal aortic aneurysm, without rupture: I71.43

## 2020-05-10 LAB — POCT I-STAT CREATININE: Creatinine, Ser: 1.2 mg/dL (ref 0.61–1.24)

## 2020-05-10 MED ORDER — IOHEXOL 300 MG/ML  SOLN
125.0000 mL | Freq: Once | INTRAMUSCULAR | Status: AC | PRN
  Administered 2020-05-10: 125 mL via INTRAVENOUS

## 2020-05-13 ENCOUNTER — Telehealth: Payer: Self-pay

## 2020-05-13 NOTE — Telephone Encounter (Signed)
Incoming call from [place] at [facility] calling to make provider aware of this pt's CT scan results, imaging report available in Epic.

## 2020-05-15 ENCOUNTER — Encounter: Payer: Medicare HMO | Admitting: Urology

## 2020-05-23 ENCOUNTER — Ambulatory Visit (INDEPENDENT_AMBULATORY_CARE_PROVIDER_SITE_OTHER): Payer: Medicare HMO | Admitting: Urology

## 2020-05-23 ENCOUNTER — Other Ambulatory Visit: Payer: Self-pay

## 2020-05-23 VITALS — BP 146/72 | HR 73 | Ht 71.0 in | Wt 190.0 lb

## 2020-05-23 DIAGNOSIS — I714 Abdominal aortic aneurysm, without rupture, unspecified: Secondary | ICD-10-CM

## 2020-05-23 DIAGNOSIS — N401 Enlarged prostate with lower urinary tract symptoms: Secondary | ICD-10-CM

## 2020-05-23 DIAGNOSIS — R3129 Other microscopic hematuria: Secondary | ICD-10-CM | POA: Diagnosis not present

## 2020-05-23 DIAGNOSIS — N201 Calculus of ureter: Secondary | ICD-10-CM

## 2020-05-23 DIAGNOSIS — N138 Other obstructive and reflux uropathy: Secondary | ICD-10-CM | POA: Diagnosis not present

## 2020-05-23 NOTE — Progress Notes (Addendum)
05/23/2020 2:15 PM   Garrett Moore January 17, 1941 [id number]  Referring provider: [name], MD [address] [place],  [place] [address]  Chief Complaint  Patient presents with  . Follow-up    HPI: 80 year old male with severe urinary symptoms and microscopic hematuria who presents today for follow-up of CT scan.  He initially was scheduled for cystoscopy, TRUS as well as follow-up of CT urogram.  CT urogram however showed a large 1.6 cm left distal ureteral calculus with proximal hydroureter and a left duplicated system.  There is also incidental 5.9 cm AAA for which he is asymptomatic.  He denies ever having significant flank pain.  His symptoms are primarily lower tract in nature.  No personal history of kidney stones.  He continues to have severe urinary symptoms.  Based on CT scan, he does have an enlarged prostate measuring 5.7 x 4.0 x 5.3 cm, approximately 63 g calculated ellipsoid volume.  PSA was unremarkable.  He is currently on no BPH medications.  He would like to avoid these.  He was interested in [place].   PMH: Past Medical History:  Diagnosis Date  . Aortic valvar stenosis 02/08/2018   Mild to Moderate Noted on ECHO  . Basal cell carcinoma    Face  . CKD (chronic kidney disease), stage III ([place])   . COPD (chronic obstructive pulmonary disease) ([place])   . Coronary artery disease   . DVT (deep venous thrombosis) ([place])    age 15, leg after Tonsillectomy  . Grade I diastolic dysfunction 08/3014   Noted on ECHO  . Heart murmur   . Hyperlipidemia   . Myocardial infarction ([place]) 07/1983  . OA (osteoarthritis)     Surgical History: Past Surgical History:  Procedure Laterality Date  . CARDIAC CATHETERIZATION  08/14/1983  . CARDIOVASCULAR STRESS TEST  01/23/2011   normal myocardial perfusion scan demonstrating an attenuation artifact in the inferior region of the myocardium. no ischemia or infarct/scar seen in remaining myocardium  . CAROTID  DUPLEX  05/21/2006   right bulb-0-49% diameter reduction; left bulb and proximal ICA-0-49% diameter reduction  . CATARACT EXTRACTION, BILATERAL  01/2019  . CORONARY ANGIOPLASTY  07/1983   PTCA Prox RCA  . DOPPLER ECHOCARDIOGRAPHY  07/21/2012   EF 50-55%, aortic valve noncoronary cusp mobility was severely restricted.  Marland Kitchen ROTATOR CUFF REPAIR Left   . TEMPORARY PACEMAKER  07/1983  . TONSILLECTOMY    . TOTAL HIP ARTHROPLASTY Left 02/21/2019   Procedure: TOTAL HIP ARTHROPLASTY ANTERIOR APPROACH;  Surgeon: Paralee Cancel, MD;  Location: [facility];  Service: Orthopedics;  Laterality: Left;  70 mins    Home Medications:  Allergies as of 05/23/2020   No Known Allergies     Medication List       Accurate as of May 23, 2020  2:15 PM. If you have any questions, ask your nurse or doctor.        STOP taking these medications   docusate sodium 100 MG capsule Commonly known as: Colace Stopped by: Hollice Espy, MD   ferrous sulfate 325 (65 FE) MG tablet Commonly known as: FerrouSul Stopped by: Hollice Espy, MD   HYDROcodone-acetaminophen 5-325 MG tablet Commonly known as: Norco Stopped by: Hollice Espy, MD   methocarbamol 500 MG tablet Commonly known as: Robaxin Stopped by: Hollice Espy, MD   nicotine polacrilex 4 MG gum Commonly known as: NICORETTE Stopped by: Hollice Espy, MD   polyethylene glycol 17 g packet Commonly known as: MIRALAX / GLYCOLAX Stopped by:  Hollice Espy, MD   prednisoLONE acetate 1 % ophthalmic suspension Commonly known as: PRED FORTE Stopped by: Hollice Espy, MD   rivaroxaban 10 MG Tabs tablet Commonly known as: Xarelto Stopped by: Hollice Espy, MD     TAKE these medications   aspirin EC 81 MG tablet Take 81 mg by mouth daily. Swallow whole. What changed: Another medication with the same name was removed. Continue taking this medication, and follow the directions you see here. Changed by: Hollice Espy, MD   multivitamin with minerals Tabs  tablet Take 1 tablet by mouth daily.   simvastatin 80 MG tablet Commonly known as: ZOCOR Take 80 mg by mouth daily at 6 PM.       Allergies: No Known Allergies  Family History: Family History  Problem Relation Age of Onset  . Heart Problems Mother   . AAA (abdominal aortic aneurysm) Mother     Social History:  reports that he has quit smoking. His smoking use included cigarettes. He has a 100.00 pack-year smoking history. He has never used smokeless tobacco. He reports previous alcohol use. He reports that he does not use drugs.   Physical Exam: BP (!) 146/72   Pulse 73   Ht 5\' 11"  (1.803 m)   Wt 190 lb (86.2 kg)   BMI 26.50 kg/m   Constitutional:  Alert and oriented, No acute distress.  Accompanied by wife today. HEENT: [place] AT, moist mucus membranes.  Trachea midline, no masses. Cardiovascular: No clubbing, cyanosis, or edema. Respiratory: Normal respiratory effort, no increased work of breathing. Skin: No rashes, bruises or suspicious lesions. Neurologic: Grossly intact, no focal deficits, moving all 4 extremities. Psychiatric: Normal mood and affect.  Laboratory Data: Lab Results  Component Value Date   WBC 19.4 (H) 02/22/2019   HGB 12.4 (L) 02/22/2019   HCT 38.3 (L) 02/22/2019   MCV 98.5 02/22/2019   PLT 272 02/22/2019    Lab Results  Component Value Date   CREATININE 1.20 05/10/2020    Urinalysis UA today with 11-30 white blood cells, 3-10 red blood cells otherwise unremarkable.  No bacteria.  Nitrate negative.  Pertinent Imaging: Results for orders placed during the hospital encounter of 05/10/20  CT HEMATURIA WORKUP  Narrative CLINICAL DATA:  Hematuria.  EXAM: CT ABDOMEN AND PELVIS WITHOUT AND WITH CONTRAST  TECHNIQUE: Multidetector CT imaging of the abdomen and pelvis was performed following the standard protocol before and following the bolus administration of intravenous contrast.  CONTRAST:  161mL OMNIPAQUE IOHEXOL 300 MG/ML   SOLN  COMPARISON:  None.  FINDINGS: Lower chest: Emphysematous changes noted within the lung bases. No acute abnormality.  Hepatobiliary: No focal liver abnormality is seen. No gallstones, gallbladder wall thickening, no focal liver abnormality. Tiny calcified gallstone noted within the fundus. No gallbladder wall inflammation or pericholecystic fluid. No biliary ductal dilatation.  Pancreas: Unremarkable. No pancreatic ductal dilatation or surrounding inflammatory changes.  Spleen: Normal in size without focal abnormality.  Adrenals/Urinary Tract: Normal appearance of the adrenal glands. Bilateral renal vascular calcifications are identified. No kidney stones identified. No hydronephrosis identified bilaterally. Mild distal left hydroureter. Calcification at the left UVJ measures 1.6 cm, image 73/7. Two small left kidney cysts are identified. The largest is in the anterior cortex of the left mid kidney measuring 1.5 cm.  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  Stomach/Bowel: Stomach is nondistended. Duodenal diverticulum is identified arising off the medial wall of the descending duodenum measuring 3.8 x 2.6  cm. The appendix is visualized and appears normal. There is no bowel wall thickening, inflammation, or distension. Extensive distal colonic diverticulosis identified without acute inflammation.  Vascular/Lymphatic: Infrarenal abdominal aortic aneurysm measures 5.9 cm in maximum AP dimension. Aneurysmal dilatation of bilateral common iliac arteries. The left common iliac artery measures 3.4 cm. The right common iliac artery measures 4 cm. No abdominopelvic adenopathy.  Reproductive: Mild prostate gland enlargement has mass effect upon the bladder base.  Other: No ascites or focal fluid collections.  Musculoskeletal: Previous left hip arthroplasty. Multilevel degenerative disc disease noted within the  thoracolumbar spine.  IMPRESSION: 1. Urinary tract calculi at the left UVJ measures 1.6 cm and results in mild distal left hydroureter. 2. 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 3. Infrarenal abdominal aortic aneurysm measures 5.9 cm in maximum AP dimension. Recommend referral to a vascular specialist. This recommendation follows ACR consensus guidelines: White Paper of the ACR Incidental Findings Committee II on Vascular Findings. J Am Coll Radiol 2013; 10:789-794. 4. Gallstone. 5. Aortic Atherosclerosis (ICD10-I70.0) and Emphysema (ICD10-J43.9).  These results will be called to the ordering clinician or representative by the Radiologist Assistant, and communication documented in the PACS or [employer].   Electronically Signed By: Kerby Moors M.D. On: 05/10/2020 17:01  I personally reviewed the images of the CT scan.  Agree with radiologic interpretation.   Assessment & Plan:    1. Left ureteral calculus Incidental 1.6 cm obstructing left distal ureteral calculus  We discussed given the size of the stone, is unlikely to pass spontaneously.  We discussed treatment options including ESWL versus ureteroscopy.  Based on the size alone as well as location, it may be best served with a ureteroscopic approach.  We discussed this procedure at length including risk of bleeding, infection, damage surrounding structures including ureteral injury, need for ureteral stent versus staged procedure.  All questions were answered.  We will also refer him to vascular surgery as outlined below, may need to have intervention on his AAA prior to treating this chronic obstructing stone.  Warning symptoms reviewed, currently asymptomatic from this.  Preoperative UA/urine culture sent today  2. Benign prostatic hyperplasia with urinary obstruction Enlarged prostate with both irritative obstructive urinary symptoms  Some of his bladder  irritation symptoms may be related to the stone.  Have urged him to treat his ureteral stone first and then see if his urinary symptoms improved.  If not, can consider an outlet procedure.  He is absolutely agreeable this plan as well. - Urinalysis, Complete  3. Microscopic hematuria Likely to secondary to #1, cystoscopy at the time of ureteroscopy to complete his work-up - CULTURE, URINE COMPREHENSIVE  4. Abdominal aortic aneurysm (AAA) without rupture ([place]) Reach out to vascular surgery today to help elucidate the timing and or necessity of treating this potentially prior to elective treatment of his stone - Ambulatory referral to Vascular Surgery    Hollice Espy, MD  [facility] [address], [place], [place] [address] [phone]  Addendum: I spoke with Dr. Delana Meyer.  He would like to address the AAA prior to ureteral stone as the patient is asymptomatic and the obstruction is likely very chronic.  We will upgrade the referral to urgent and let the patient know.  RTC in 6 weeks.

## 2020-05-23 NOTE — Addendum Note (Signed)
Addended by: Hollice Espy on: 05/23/2020 06:07 PM   Modules accepted: Orders

## 2020-05-24 LAB — URINALYSIS, COMPLETE
Bilirubin, UA: NEGATIVE
Glucose, UA: NEGATIVE
Ketones, UA: NEGATIVE
Nitrite, UA: NEGATIVE
Protein,UA: NEGATIVE
Specific Gravity, UA: 1.025 (ref 1.005–1.030)
Urobilinogen, Ur: 0.2 mg/dL (ref 0.2–1.0)
pH, UA: 5 (ref 5.0–7.5)

## 2020-05-24 LAB — MICROSCOPIC EXAMINATION: Bacteria, UA: NONE SEEN

## 2020-05-26 DIAGNOSIS — I714 Abdominal aortic aneurysm, without rupture, unspecified: Secondary | ICD-10-CM | POA: Insufficient documentation

## 2020-05-26 NOTE — Progress Notes (Signed)
MRN : [id number]  Garrett Moore is a 80 y.o. (10-28-1940) male who presents with chief complaint of No chief complaint on file. Marland Kitchen  History of Present Illness:   The patient presents to the office for evaluation of an abdominal aortic aneurysm. The aneurysm was found incidentally by CT scan being done for evaluation of a kidney stone. Patient denies abdominal pain or unusual back pain, no other abdominal complaints.  No history of an acute onset of painful blue discoloration of the toes.     No family history of AAA.   Patient denies amaurosis fugax or TIA symptoms. There is no history of claudication or rest pain symptoms of the lower extremities.  The patient denies angina or shortness of breath.  CT scan is reviewed by me and shows an AAA that measures 6.4cm  No outpatient medications have been marked as taking for the 05/27/20 encounter (Appointment) with Delana Meyer, Dolores Lory, MD.    Past Medical History:  Diagnosis Date  . Aortic valvar stenosis 02/08/2018   Mild to Moderate Noted on ECHO  . Basal cell carcinoma    Face  . CKD (chronic kidney disease), stage III ([place])   . COPD (chronic obstructive pulmonary disease) ([place])   . Coronary artery disease   . DVT (deep venous thrombosis) ([name])    age 25, leg after Tonsillectomy  . Grade I diastolic dysfunction 95/6387   Noted on ECHO  . Heart murmur   . Hyperlipidemia   . Myocardial infarction ([place]) 07/1983  . OA (osteoarthritis)     Past Surgical History:  Procedure Laterality Date  . CARDIAC CATHETERIZATION  08/14/1983  . CARDIOVASCULAR STRESS TEST  01/23/2011   normal myocardial perfusion scan demonstrating an attenuation artifact in the inferior region of the myocardium. no ischemia or infarct/scar seen in remaining myocardium  . CAROTID DUPLEX  05/21/2006   right bulb-0-49% diameter reduction; left bulb and proximal ICA-0-49% diameter reduction  . CATARACT EXTRACTION, BILATERAL  01/2019  . CORONARY ANGIOPLASTY  07/1983    PTCA Prox RCA  . DOPPLER ECHOCARDIOGRAPHY  07/21/2012   EF 50-55%, aortic valve noncoronary cusp mobility was severely restricted.  Marland Kitchen ROTATOR CUFF REPAIR Left   . TEMPORARY PACEMAKER  07/1983  . TONSILLECTOMY    . TOTAL HIP ARTHROPLASTY Left 02/21/2019   Procedure: TOTAL HIP ARTHROPLASTY ANTERIOR APPROACH;  Surgeon: Paralee Cancel, MD;  Location: [facility];  Service: Orthopedics;  Laterality: Left;  70 mins    Social History Social History   Tobacco Use  . Smoking status: Former Smoker    Packs/day: 2.00    Years: 50.00    Pack years: 100.00    Types: Cigarettes  . Smokeless tobacco: Never Used  . Tobacco comment: quit 14 years ago  Vaping Use  . Vaping Use: Never used  Substance Use Topics  . Alcohol use: Not Currently  . Drug use: Never    Family History Family History  Problem Relation Age of Onset  . Heart Problems Mother   . AAA (abdominal aortic aneurysm) Mother   No family history of bleeding/clotting disorders, porphyria or autoimmune disease   No Known Allergies   REVIEW OF SYSTEMS (Negative unless checked)  Constitutional: [] Weight loss  [] Fever  [] Chills Cardiac: [] Chest pain   [] Chest pressure   [] Palpitations   [] Shortness of breath when laying flat   [] Shortness of breath with exertion. Vascular:  [] Pain in legs with walking   [] Pain in legs at rest  [] History of DVT   []   Phlebitis   [] Swelling in legs   [] Varicose veins   [] Non-healing ulcers Pulmonary:   [] Uses home oxygen   [] Productive cough   [] Hemoptysis   [] Wheeze  [] COPD   [] Asthma Neurologic:  [] Dizziness   [] Seizures   [] History of stroke   [] History of TIA  [] Aphasia   [] Vissual changes   [] Weakness or numbness in arm   [] Weakness or numbness in leg Musculoskeletal:   [] Joint swelling   [] Joint pain   [] Low back pain Hematologic:  [] Easy bruising  [] Easy bleeding   [] Hypercoagulable state   [] Anemic Gastrointestinal:  [] Diarrhea   [] Vomiting  [] Gastroesophageal reflux/heartburn   [] Difficulty  swallowing. Genitourinary:  [] Chronic kidney disease   [] Difficult urination  [] Frequent urination   [] Blood in urine Skin:  [] Rashes   [] Ulcers  Psychological:  [] History of anxiety   []  History of major depression.  Physical Examination  There were no vitals filed for this visit. There is no height or weight on file to calculate BMI. Gen: WD/WN, NAD Head: [place]/AT, No temporalis wasting.  Ear/Nose/Throat: Hearing grossly intact, nares w/o erythema or drainage, poor dentition Eyes: PER, EOMI, sclera nonicteric.  Neck: Supple, no masses.  No bruit or JVD.  Pulmonary:  Good air movement, clear to auscultation bilaterally, no use of accessory muscles.  Cardiac: RRR, normal S1, S2, no Murmurs. Vascular:  Vessel Right Left  Radial Palpable Palpable  Popliteal Palpable Palpable  PT Palpable Palpable  DP Palpable Palpable  Gastrointestinal: soft, non-distended. No guarding/no peritoneal signs.  Musculoskeletal: M/S 5/5 throughout.  No deformity or atrophy.  Neurologic: CN 2-12 intact. Pain and light touch intact in extremities.  Symmetrical.  Speech is fluent. Motor exam as listed above. Psychiatric: Judgment intact, Mood & affect appropriate for pt's clinical situation. Dermatologic: No rashes or ulcers noted.  No changes consistent with cellulitis.  CBC Lab Results  Component Value Date   WBC 19.4 (H) 02/22/2019   HGB 12.4 (L) 02/22/2019   HCT 38.3 (L) 02/22/2019   MCV 98.5 02/22/2019   PLT 272 02/22/2019    BMET    Component Value Date/Time   NA 134 (L) 02/22/2019 0236   K 3.9 02/22/2019 0236   CL 101 02/22/2019 0236   CO2 23 02/22/2019 0236   GLUCOSE 180 (H) 02/22/2019 0236   BUN 15 02/22/2019 0236   CREATININE 1.20 05/10/2020 1431   CALCIUM 8.7 (L) 02/22/2019 0236   GFRNONAA 53 (L) 02/22/2019 0236   GFRAA >60 02/22/2019 0236   Estimated Creatinine Clearance: 53.2 mL/min (by C-G formula based on SCr of 1.2 mg/dL).  COAG No results found for: INR,  PROTIME  Radiology CT HEMATURIA WORKUP  Result Date: 05/10/2020 CLINICAL DATA:  Hematuria. EXAM: CT ABDOMEN AND PELVIS WITHOUT AND WITH CONTRAST TECHNIQUE: Multidetector CT imaging of the abdomen and pelvis was performed following the standard protocol before and following the bolus administration of intravenous contrast. CONTRAST:  137mL OMNIPAQUE IOHEXOL 300 MG/ML  SOLN COMPARISON:  None. FINDINGS: Lower chest: Emphysematous changes noted within the lung bases. No acute abnormality. Hepatobiliary: No focal liver abnormality is seen. No gallstones, gallbladder wall thickening, no focal liver abnormality. Tiny calcified gallstone noted within the fundus. No gallbladder wall inflammation or pericholecystic fluid. No biliary ductal dilatation. Pancreas: Unremarkable. No pancreatic ductal dilatation or surrounding inflammatory changes. Spleen: Normal in size without focal abnormality. Adrenals/Urinary Tract: Normal appearance of the adrenal glands. Bilateral renal vascular calcifications are identified. No kidney stones identified. No hydronephrosis identified bilaterally. Mild distal left hydroureter. Calcification at  the left UVJ measures 1.6 cm, image 73/7. Two small left kidney cysts are identified. The largest is in the anterior cortex of the left mid kidney measuring 1.5 cm. 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 Stomach/Bowel: Stomach is nondistended. Duodenal diverticulum is identified arising off the medial wall of the descending duodenum measuring 3.8 x 2.6 cm. The appendix is visualized and appears normal. There is no bowel wall thickening, inflammation, or distension. Extensive distal colonic diverticulosis identified without acute inflammation. Vascular/Lymphatic: Infrarenal abdominal aortic aneurysm measures 5.9 cm in maximum AP dimension. Aneurysmal dilatation of bilateral common iliac arteries. The left common iliac artery measures 3.4 cm.  The right common iliac artery measures 4 cm. No abdominopelvic adenopathy. Reproductive: Mild prostate gland enlargement has mass effect upon the bladder base. Other: No ascites or focal fluid collections. Musculoskeletal: Previous left hip arthroplasty. Multilevel degenerative disc disease noted within the thoracolumbar spine. IMPRESSION: 1. Urinary tract calculi at the left UVJ measures 1.6 cm and results in mild distal left hydroureter. 2. 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 3. Infrarenal abdominal aortic aneurysm measures 5.9 cm in maximum AP dimension. Recommend referral to a vascular specialist. This recommendation follows ACR consensus guidelines: White Paper of the ACR Incidental Findings Committee II on Vascular Findings. J Am Coll Radiol 2013; 10:789-794. 4. Gallstone. 5. Aortic Atherosclerosis (ICD10-I70.0) and Emphysema (ICD10-J43.9). These results will be called to the ordering clinician or representative by the Radiologist Assistant, and communication documented in the PACS or [employer]. Electronically Signed   By: Kerby Moors M.D.   On: 05/10/2020 17:01     Assessment/Plan 1. AAA (abdominal aortic aneurysm) without rupture ([place]) Recommend: The aneurysm is > 5 cm and therefore should undergo repair. Patient is status post CT scan of the abdominal aorta. The patient is a candidate for endovascular repair.   He will require cardiac clearance.   The patient will continue antiplatelet therapy as prescribed (since the patient is undergoing endovascular repair as opposed to open repair) as well as aggressive management of hyperlipidemia. Exercise is again strongly encouraged.   The patient is reminded that lifetime routine surveillance is a necessity with an endograft.   The risks and benefits of AAA repair are reviewed with the patient.  All questions are answered.  Alternative therapies are also discussed.  The patient agrees to  proceed with endovascular aneurysm repair.  Patient will follow-up with me in the office after the surgery.  2. Mixed hyperlipidemia Continue statin as ordered and reviewed, no changes at this time     [name], MD  05/26/2020 8:42 PM

## 2020-05-27 ENCOUNTER — Other Ambulatory Visit: Payer: Self-pay

## 2020-05-27 ENCOUNTER — Ambulatory Visit (INDEPENDENT_AMBULATORY_CARE_PROVIDER_SITE_OTHER): Payer: Medicare HMO | Admitting: Vascular Surgery

## 2020-05-27 VITALS — BP 122/73 | HR 76 | Ht 71.0 in | Wt 189.0 lb

## 2020-05-27 DIAGNOSIS — E782 Mixed hyperlipidemia: Secondary | ICD-10-CM

## 2020-05-27 DIAGNOSIS — I714 Abdominal aortic aneurysm, without rupture, unspecified: Secondary | ICD-10-CM

## 2020-05-28 ENCOUNTER — Telehealth: Payer: Self-pay

## 2020-05-28 LAB — CULTURE, URINE COMPREHENSIVE

## 2020-05-28 NOTE — Telephone Encounter (Signed)
-----   Message from Minna Merritts, MD sent at 05/28/2020  8:07 AM EST ----- Hi there,  Can we schedule this gentleman for new patient visit next Monday, March 14 with me 320 Preop AAA repair per request from Dr. Delana Meyer Looks like that was my first available spot  Thx TG

## 2020-05-28 NOTE — Telephone Encounter (Signed)
lmov to confirm appt time.

## 2020-05-29 ENCOUNTER — Encounter (INDEPENDENT_AMBULATORY_CARE_PROVIDER_SITE_OTHER): Payer: Self-pay | Admitting: Vascular Surgery

## 2020-05-31 ENCOUNTER — Telehealth: Payer: Self-pay | Admitting: *Deleted

## 2020-05-31 DIAGNOSIS — N201 Calculus of ureter: Secondary | ICD-10-CM

## 2020-05-31 MED ORDER — SULFAMETHOXAZOLE-TRIMETHOPRIM 800-160 MG PO TABS: 1.0000 | ORAL_TABLET | Freq: Two times a day (BID) | ORAL | 0 refills | Status: DC

## 2020-05-31 NOTE — Telephone Encounter (Addendum)
Patient informed-he has been having increased frequency-sent in Bactrim to his pharmacy. Appointment scheduled for 07/09/20 to follow up with Dr. Erlene Quan.   ----- Message from Hollice Espy, MD sent at 05/30/2020  3:36 PM EST ----- We sent this urine off for preoperative urine culture thinking that we are going to move forward with the stone surgery.  He ended up growing a very low colony count of E. coli.  I doubt this is a real infection.  If he is having any urinary tract symptoms consistent with UTI, going treat with Bactrim DS twice daily x7 days.  Otherwise, he will need a repeat urine culture prior to his stone treatment (after AAA treated by Dr. Delana Meyer)  Hollice Espy, MD

## 2020-06-02 NOTE — Progress Notes (Unsigned)
Cardiology Office Note  Date:  06/03/2020   ID:  Garrett Moore 06, 1942, MRN [id number]  PCP:  [name], MD   Chief Complaint  Patient presents with  . NEW patient-preop clearance    HPI:  Garrett Moore is a 80 year old gentleman with past medical history of Peripheral arterial disease, AAA CAD, PTCA of RCA (sx of fatigue) 1985 Aortic valve stenosis Mildly dilated ascending aorta Mild to moderate bilateral carotid arterial disease Referred by Dr. Ronalee Belts for cardiac clearance. AAA repair, scheduled to undergo endovascular repair end of March 2022  Discussed prior cardiac events, catheterization from 1985 reviewed Angioplasty to RCA at that time Denies any anginal symptoms since that time  Previously seen by cardiology in 2014 Echocardiogram at that time with mild aortic valve stenosis at that time, mild dilation of aortic root and ascending aorta  CT scan is reviewed on today's visit, images pulled up AAA estimated at 6.4cm  Reports that he moved houses last year Prior to that was walking 3 miles at a time with no symptoms of angina Stays active now but less regular exercise Able to walk 1 mile without any difficulty,  stairs 1-2 flights with no shortness of breath, does not need to stop  does ADLs, does most of everything around the house for his wife Able to walk around all of [employer], does this frequently, does not have to stop, no angina Overall denies any symptoms concerning for unstable angina  EKG personally reviewed by myself on todays visit Shows normal sinus rhythm rate 81 bpm no significant ST or T wave changes  PMH:   has a past medical history of Aortic valvar stenosis (02/08/2018), Basal cell carcinoma, CKD (chronic kidney disease), stage III ([place]), COPD (chronic obstructive pulmonary disease) ([place]), Coronary artery disease, DVT (deep venous thrombosis) ([place]), Grade I diastolic dysfunction (96/2229), Heart murmur, Hyperlipidemia, Myocardial  infarction ([place]) (07/1983), and OA (osteoarthritis).  PSH:    Past Surgical History:  Procedure Laterality Date  . CARDIAC CATHETERIZATION  08/14/1983  . CARDIOVASCULAR STRESS TEST  01/23/2011   normal myocardial perfusion scan demonstrating an attenuation artifact in the inferior region of the myocardium. no ischemia or infarct/scar seen in remaining myocardium  . CAROTID DUPLEX  05/21/2006   right bulb-0-49% diameter reduction; left bulb and proximal ICA-0-49% diameter reduction  . CATARACT EXTRACTION, BILATERAL  01/2019  . CORONARY ANGIOPLASTY  07/1983   PTCA Prox RCA  . DOPPLER ECHOCARDIOGRAPHY  07/21/2012   EF 50-55%, aortic valve noncoronary cusp mobility was severely restricted.  Marland Kitchen ROTATOR CUFF REPAIR Left   . TEMPORARY PACEMAKER  07/1983  . TONSILLECTOMY    . TOTAL HIP ARTHROPLASTY Left 02/21/2019   Procedure: TOTAL HIP ARTHROPLASTY ANTERIOR APPROACH;  Surgeon: Paralee Cancel, MD;  Location: [facility];  Service: Orthopedics;  Laterality: Left;  70 mins    Current Outpatient Medications  Medication Sig Dispense Refill  . aspirin EC 81 MG tablet Take 81 mg by mouth daily. Swallow whole.    . Multiple Vitamin (MULTIVITAMIN WITH MINERALS) TABS tablet Take 1 tablet by mouth daily.    . simvastatin (ZOCOR) 80 MG tablet Take 80 mg by mouth daily at 6 PM.     . sulfamethoxazole-trimethoprim (BACTRIM DS) 800-160 MG tablet Take 1 tablet by mouth 2 (two) times daily. 14 tablet 0   No current facility-administered medications for this visit.     Allergies:   Patient has no known allergies.   Social History:  The patient  reports  that he has quit smoking. His smoking use included cigarettes. He has a 100.00 pack-year smoking history. He has never used smokeless tobacco. He reports previous alcohol use. He reports that he does not use drugs.   Family History:   family history includes AAA (abdominal aortic aneurysm) in his mother; Heart Problems in his mother.    Review of Systems: Review of  Systems  Constitutional: Negative.   HENT: Negative.   Respiratory: Negative.   Cardiovascular: Negative.   Gastrointestinal: Negative.   Musculoskeletal: Negative.   Neurological: Negative.   Psychiatric/Behavioral: Negative.   All other systems reviewed and are negative.   PHYSICAL EXAM: VS:  BP 110/62 (BP Location: Right Arm, Patient Position: Sitting, Cuff Size: Large)   Pulse 81   Ht 5\' 11"  (1.803 m)   Wt 190 lb (86.2 kg)   SpO2 96%   BMI 26.50 kg/m  , BMI Body mass index is 26.5 kg/m. GEN: Well nourished, well developed, in no acute distress HEENT: normal Neck: no JVD, carotid bruits, or masses Cardiac: RRR; 8-6/7 systolic ejection murmur right sternal border no gallops,no edema  Respiratory:  clear to auscultation bilaterally, normal work of breathing GI: soft, nontender, nondistended, + BS MS: no deformity or atrophy Skin: warm and dry, no rash Neuro:  Strength and sensation are intact Psych: euthymic mood, full affect   Recent Labs: 05/10/2020: Creatinine, Ser 1.20    Lipid Panel No results found for: CHOL, HDL, LDLCALC, TRIG    Wt Readings from Last 3 Encounters:  06/03/20 190 lb (86.2 kg)  05/27/20 189 lb (85.7 kg)  05/23/20 190 lb (86.2 kg)     ASSESSMENT AND PLAN:  Problem List Items Addressed This Visit      Cardiology Problems   AAA (abdominal aortic aneurysm) without rupture (HCC) - Primary   Relevant Orders   EKG 12-Lead   VAS US CAROTID   ECHOCARDIOGRAM COMPLETE    Other Visit Diagnoses    Aortic valve stenosis, etiology of cardiac valve disease unspecified       Relevant Orders   EKG 12-Lead   VAS US CAROTID   ECHOCARDIOGRAM COMPLETE   Carotid artery disease, unspecified laterality, unspecified type (HCC)       Relevant Orders   EKG 12-Lead   VAS US CAROTID   ECHOCARDIOGRAM COMPLETE   PAD (peripheral artery disease) (HCC)       Mixed hyperlipidemia         AAA CT scan images reviewed, Plan for endovascular repair with Dr.  Junie Panning end of the month Will likely be an acceptable candidate for procedure Echocardiogram has been ordered given murmur on exam, known history of mild aortic valve stenosis in 2014 -There is heavy calcification on the aortic valve on CT scan  Aortic valve stenosis Mild stenosis in 2014, Plan for echocardiogram for further evaluation We will try to schedule prior to surgery of his AAA  PAD/carotid stenosis Mild to moderate disease noted 2014 Recommend repeat carotid ultrasound  Hyperlipidemia Lab work from primary care has been requested Goal LDL less than 70  CAD with stable angina Prior PTCA 1980s, no intervention since that time Denies anginal symptoms,  Dilated aorta Previously noted to be 4 cm in 2014, repeat echocardiogram ordered    Total encounter time more than 60 minutes  Greater than 50% was spent in counseling and coordination of care with the patient    Signed, Esmond Plants, M.D., Ph.D. [place], [place]

## 2020-06-03 ENCOUNTER — Other Ambulatory Visit: Payer: Self-pay

## 2020-06-03 ENCOUNTER — Ambulatory Visit: Payer: Medicare HMO | Admitting: Cardiovascular Disease

## 2020-06-03 ENCOUNTER — Encounter: Payer: Self-pay | Admitting: Cardiovascular Disease

## 2020-06-03 VITALS — BP 110/62 | HR 81 | Ht 71.0 in | Wt 190.0 lb

## 2020-06-03 DIAGNOSIS — I779 Disorder of arteries and arterioles, unspecified: Secondary | ICD-10-CM

## 2020-06-03 DIAGNOSIS — I714 Abdominal aortic aneurysm, without rupture, unspecified: Secondary | ICD-10-CM

## 2020-06-03 DIAGNOSIS — I350 Nonrheumatic aortic (valve) stenosis: Secondary | ICD-10-CM

## 2020-06-03 DIAGNOSIS — E782 Mixed hyperlipidemia: Secondary | ICD-10-CM

## 2020-06-03 DIAGNOSIS — I739 Peripheral vascular disease, unspecified: Secondary | ICD-10-CM

## 2020-06-03 NOTE — Patient Instructions (Addendum)
Medication Instructions:  No changes   Lab work: Will Dr. Harrington Challenger PCP to have recent labs faxed over for review   Testing/Procedures: WE WILL CALL WITH RESULTS, MAY SEE ON [place].   Echo for aortic valve stenosis Carotid u/s for known carotid disease  Your physician has requested that you have an echocardiogram. Echocardiography is a painless test that uses sound waves to create images of your heart. It provides your doctor with information about the size and shape of your heart and how well your heart's chambers and valves are working. This procedure takes approximately one hour. There are no restrictions for this procedure.  There is a possibility that an IV may need to be started during your test to inject an image enhancing agent. This is done to obtain more optimal pictures of your heart. Therefore we ask that you do at least drink some water prior to coming in to hydrate your veins.     Follow-Up: At [facility], you and your health needs are our priority.  As part of our continuing mission to provide you with exceptional heart care, we have created designated Provider Care Teams.  These Care Teams include your primary Cardiologist (physician) and Advanced Practice Providers (APPs -  Physician Assistants and Nurse Practitioners) who all work together to provide you with the care you need, when you need it.  . You will need a follow up appointment in 12 months  . Providers on your designated Care Team:   . Murray Hodgkins, NP . Christell Faith, PA-C . Marrianne Mood, PA-C  Any Other Special Instructions Will Be Listed Below (If Applicable).  COVID-19 Vaccine Information can be found at: ShippingScam.co.uk For questions related to vaccine distribution or appointments, please email vaccine@[email or web address] .com or call [phone].

## 2020-06-07 ENCOUNTER — Other Ambulatory Visit: Payer: Self-pay | Admitting: Cardiovascular Disease

## 2020-06-07 DIAGNOSIS — I350 Nonrheumatic aortic (valve) stenosis: Secondary | ICD-10-CM

## 2020-06-07 DIAGNOSIS — I779 Disorder of arteries and arterioles, unspecified: Secondary | ICD-10-CM

## 2020-06-10 ENCOUNTER — Other Ambulatory Visit: Payer: Self-pay

## 2020-06-10 ENCOUNTER — Ambulatory Visit (INDEPENDENT_AMBULATORY_CARE_PROVIDER_SITE_OTHER): Payer: Medicare HMO

## 2020-06-10 DIAGNOSIS — I779 Disorder of arteries and arterioles, unspecified: Secondary | ICD-10-CM | POA: Diagnosis not present

## 2020-06-13 ENCOUNTER — Telehealth (INDEPENDENT_AMBULATORY_CARE_PROVIDER_SITE_OTHER): Payer: Self-pay

## 2020-06-13 NOTE — Telephone Encounter (Signed)
Spoke with the patient and he is now scheduled with Dr. Delana Meyer for a Endovascular AAA stent graft repair on 06/26/20 with a 11:30 am arrival time to the [facility]. Phone call pre-op on 06/19/20 between 1-5 pm and covid test on 06/24/20 between 8-2 pm at the [facility]. Pre-surgical instructions were discussed and will be mailed.

## 2020-06-14 ENCOUNTER — Ambulatory Visit (HOSPITAL_COMMUNITY): Payer: Medicare HMO | Attending: Cardiology

## 2020-06-14 ENCOUNTER — Other Ambulatory Visit: Payer: Self-pay

## 2020-06-14 DIAGNOSIS — I350 Nonrheumatic aortic (valve) stenosis: Secondary | ICD-10-CM | POA: Diagnosis not present

## 2020-06-14 LAB — ECHOCARDIOGRAM COMPLETE
AR max vel: 1.66 cm2
AV Area VTI: 1.58 cm2
AV Area mean vel: 1.5 cm2
AV Mean grad: 26.8 mmHg
AV Peak grad: 50.4 mmHg
Ao pk vel: 3.55 m/s
Area-P 1/2: 2.48 cm2
P 1/2 time: 458 msec
S' Lateral: 2.7 cm

## 2020-06-17 ENCOUNTER — Telehealth: Payer: Self-pay | Admitting: Cardiovascular Disease

## 2020-06-17 NOTE — Telephone Encounter (Signed)
Patient called wanting to reschedule his AAA surgery to another day as his family will not be in town at this time. Patient has been rescheduled for his surgery to 07/03/20 to arrive to the [facility] at 10:30 am. Patient's pre-op will remain the same and covid test will change to 07/01/20 between 8-2 pm at the [facility]. Pre-surgical instructions will be mailed. Patient was made aware of the changes.

## 2020-06-17 NOTE — Telephone Encounter (Signed)
Patient calling in for last weeks test results  Please advise

## 2020-06-19 ENCOUNTER — Encounter
Admission: RE | Admit: 2020-06-19 | Discharge: 2020-06-19 | Disposition: A | Discharge status: Home / Self Care | Payer: Medicare HMO | Source: Ambulatory Visit | Attending: Vascular Surgery | Admitting: Vascular Surgery

## 2020-06-19 ENCOUNTER — Other Ambulatory Visit (INDEPENDENT_AMBULATORY_CARE_PROVIDER_SITE_OTHER): Payer: Self-pay | Admitting: Nurse Practitioner

## 2020-06-19 ENCOUNTER — Other Ambulatory Visit: Payer: Self-pay

## 2020-06-19 HISTORY — DX: Family history of other specified conditions: Z84.89

## 2020-06-19 NOTE — Telephone Encounter (Signed)
Pt stated he had already spoke with someone about results, has no further questions or concerns. Reports "it must have been a mistake" for the call back. Nothing further, encounter closed.

## 2020-06-19 NOTE — Patient Instructions (Signed)
Your procedure is scheduled on: 07/03/20 Report to [facility] at 10:30. For any questions about your procedure call Dr Nino Parsley office at [phone] or Special Procedure Dept at [phone].  Remember: Instructions that are not followed completely may result in serious medical risk, up to and including death, or upon the discretion of your surgeon and anesthesiologist your surgery may need to be rescheduled.     _X__ 1. Do not eat food or drink liquids after midnight the night before your procedure.                 No gum chewing or hard candies.   __X__2.  On the morning of surgery brush your teeth with toothpaste and water, you                 may rinse your mouth with mouthwash if you wish.  Do not swallow any              toothpaste of mouthwash.     _X__ 3.  No Alcohol for 24 hours before or after surgery.   _X__ 4.  Do Not Smoke or use e-cigarettes For 24 Hours Prior to Your Surgery.                 Do not use any chewable tobacco products for at least 6 hours prior to                 surgery.  ____  5.  Bring all medications with you on the day of surgery if instructed.   __X__  6.  Notify your doctor if there is any change in your medical condition      (cold, fever, infections).     Do not wear jewelry, make-up, hairpins, clips or nail polish. Do not wear lotions, powders, deodorant or perfumes.  Do not shave 48 hours prior to surgery. Men may shave face and neck. Do not bring valuables to the hospital.    [facility] is not responsible for any belongings or valuables.  Contacts, dentures/partials or body piercings may not be worn into surgery. Bring a case for your contacts, glasses or hearing aids, a denture cup will be supplied. Leave your suitcase in the car. After surgery it may be brought to your room. For patients admitted to the hospital, discharge time is determined by your treatment team.   Patients discharged the day of surgery will  not be allowed to drive home.   Please read over the following fact sheets that you were given:   CHG soap  __X__ Take these medicines the morning of surgery with A SIP OF WATER:    1. none  2.   3.   4.  5.  6.  ____ Fleet Enema (as directed)   __X__ Use CHG Soap/SAGE wipes as directed  ____ Use inhalers on the day of surgery  ____ Stop metformin/Janumet/Farxiga 2 days prior to surgery    ____ Take 1/2 of usual insulin dose the night before surgery. No insulin the morning          of surgery.   ____ Stop Blood Thinners Coumadin/Plavix/Xarelto/Pleta/Pradaxa/Eliquis/Effient/Aspirin  on   Or contact your Surgeon, Cardiologist or Medical Doctor regarding  ability to stop your blood thinners  __X__ Stop Anti-inflammatories 7 days before surgery such as Advil, Ibuprofen, Motrin,  BC or Goodies Powder, Naprosyn, Naproxen, Aleve   __X__ Stop all herbal supplements, fish oil or vitamin E until after surgery.  ____ Bring C-Pap to the hospital.

## 2020-06-20 ENCOUNTER — Other Ambulatory Visit
Admission: RE | Admit: 2020-06-20 | Discharge: 2020-06-20 | Disposition: A | Discharge status: Home / Self Care | Payer: Medicare HMO | Source: Ambulatory Visit | Attending: Vascular Surgery | Admitting: Vascular Surgery

## 2020-06-20 DIAGNOSIS — Z01818 Encounter for other preprocedural examination: Secondary | ICD-10-CM | POA: Insufficient documentation

## 2020-06-20 LAB — CBC WITH DIFFERENTIAL/PLATELET
Abs Immature Granulocytes: 0.02 10*3/uL (ref 0.00–0.07)
Basophils Absolute: 0 10*3/uL (ref 0.0–0.1)
Basophils Relative: 1 %
Eosinophils Absolute: 0.2 10*3/uL (ref 0.0–0.5)
Eosinophils Relative: 3 %
HCT: 41 % (ref 39.0–52.0)
Hemoglobin: 13.6 g/dL (ref 13.0–17.0)
Immature Granulocytes: 0 %
Lymphocytes Relative: 25 %
Lymphs Abs: 1.9 10*3/uL (ref 0.7–4.0)
MCH: 31.4 pg (ref 26.0–34.0)
MCHC: 33.2 g/dL (ref 30.0–36.0)
MCV: 94.7 fL (ref 80.0–100.0)
Monocytes Absolute: 1.2 10*3/uL — ABNORMAL HIGH (ref 0.1–1.0)
Monocytes Relative: 16 %
Neutro Abs: 4.1 10*3/uL (ref 1.7–7.7)
Neutrophils Relative %: 55 %
Platelets: 267 10*3/uL (ref 150–400)
RBC: 4.33 MIL/uL (ref 4.22–5.81)
RDW: 12.8 % (ref 11.5–15.5)
WBC: 7.5 10*3/uL (ref 4.0–10.5)
nRBC: 0 % (ref 0.0–0.2)

## 2020-06-20 LAB — PROTIME-INR
INR: 1.1 (ref 0.8–1.2)
Prothrombin Time: 13.8 seconds (ref 11.4–15.2)

## 2020-06-20 LAB — TYPE AND SCREEN
ABO/RH(D): A POS
Antibody Screen: NEGATIVE

## 2020-06-20 LAB — BASIC METABOLIC PANEL
Anion gap: 8 (ref 5–15)
BUN: 14 mg/dL (ref 8–23)
CO2: 24 mmol/L (ref 22–32)
Calcium: 8.7 mg/dL — ABNORMAL LOW (ref 8.9–10.3)
Chloride: 105 mmol/L (ref 98–111)
Creatinine, Ser: 1.13 mg/dL (ref 0.61–1.24)
GFR, Estimated: >60 mL/min (ref >60)
Glucose, Bld: 119 mg/dL — ABNORMAL HIGH (ref 70–99)
Potassium: 4 mmol/L (ref 3.5–5.1)
Sodium: 137 mmol/L (ref 135–145)

## 2020-06-20 LAB — APTT: aPTT: 35 seconds (ref 24–36)

## 2020-06-21 NOTE — Progress Notes (Signed)
Perioperative Services  Pre-Admission/Anesthesia Testing Clinical Review  Date: 07/01/20  Patient Demographics:  Name: Garrett Moore DOB:   1940-04-26 MRN:   [id number]  Planned Surgical Procedure(s):    Case: [id number] Date/Time: 07/03/20 1145   Procedure: ENDOVASCULAR REPAIR/STENT GRAFT (N/A )   Anesthesia type: General   Diagnosis: AAA (abdominal aortic aneurysm) without rupture (Garrett Moore) [I71.4]   Pre-op diagnosis:      AAA Stent Repair  Anesthesia  GORE   AAA repair      Covid  April 4     Garrett Moore AVV spoke with Garrett Moore Anesthesia     cc:  Garrett Moore   Location: [name]   Providers: Katha Cabal, MD    NOTE: Available PAT nursing documentation and vital signs have been reviewed. Clinical nursing staff has updated patient's PMH/PSHx, current medication list, and drug allergies/intolerances to ensure comprehensive history available to assist in medical decision making as it pertains to the aforementioned surgical procedure and anticipated anesthetic course.   Clinical Discussion:  Garrett Moore is a 80 y.o. male who is submitted for pre-surgical anesthesia review and clearance prior to him undergoing the above procedure. Patient is a Former Smoker (100 pack years). Pertinent PMH includes: CAD, MI, AAA, aortic stenosis, carotid artery stenosis, aortic atherosclerosis, cardiac murmur, G1DD, HLD, DVT, CKD-III, OA.  Patient is followed by cardiology Garrett Situ, MD). He was last seen in the cardiology clinic on 06/03/2020; notes reviewed.  Recent CT imaging and 04/2020 revealed a 5.9 cm infrarenal abdominal aortic aneurysm; patient was referred to vascular surgery.  After being seen in consult, the decision was made to proceed with endovascular repair.  Patient being seen in preoperative consult. At the time of his clinic visit, patient doing well overall from a cardiovascular perspective.  Patient denies any chest pain, shortness of breath, PND, orthopnea, palpitations,  peripheral edema, vertiginous symptoms, or presyncope/syncope.  Patient with a past medical history significant for cardiovascular disease and intervention.  Patient suffered an MI back in 07/1983.  Subsequent diagnostic heart catheterization revealed a CTO of his RCA at the proximal third.  PTCA was performed leaving a residual 80% stenosis.  Last TTE was performed in 01/2018 revealing normal left ventricular systolic function with an EF of 60-65%.  There was mild to moderate aortic valve stenosis noted; mean gradient 17 mmHg.  Patient with known infrarenal AAA with aneurysmal dilatation of the patient's bilateral common iliac arteries.  Last imaging of the patient's abdomen and pelvis performed on 05/10/2020 revealed AAA measuring 5.9 cm in maximal diameter; patient was referred to vascular surgery for further evaluation.  Exam revealed a grade II-III/VI systolic ejection murmur. Patient is on a statin for his HLD diagnosis.  Blood pressure well controlled at 110/62 in clinic. Functional capacity, as defined by DASI, is documented as being >/= 4 METS.  No changes were made to patient's medication regimen.  In efforts to further restratify patient prior to planned endovascular repair, repeat TTE and carotid Dopplers were ordered.  Patient scheduled to follow-up with outpatient cardiology at defined intervals for ongoing care management.   Since being seen in the cardiology clinic, patient has undergone noninvasive cardiovascular testing as recommended by cardiology.  Carotid Doppler performed on 06/10/2020 revealed insignificant right carotid disease, and a total occlusion of the left ICA.  Repeat TTE performed on 06/14/2020 revealed a normal left ventricular systolic function (LVEF 65-46%) with moderate aortic valve stenosis; mean gradient 26.8 mmHg (see full interpretation of cardiovascular  testing and interventions below).  Patient is scheduled to undergo EVAR procedure on 07/03/2020 with [name].  Given patient's past medical history significant for cardiovascular disease and intervention, presurgical cardiac clearance was sought by PAT team.  Per cardiology, "based on patient's past medical history and time since his last clinic visit, patient would be at an overall ACCEPTABLE risk for the planned procedure without further cardiovascular testing or intervention at this time". This patient is on daily antiplatelet therapy.  He has been instructed on recommendations from vascular surgery and cardiology for the management his daily low-dose ASA. Patient to continue this medication throughout the perioperative period.   Patient denies previous perioperative complications with anesthesia in the past. In review of the available records, it is noted that patient underwent a general anesthetic course at [facility] (ASA III) in 02/2019 without documented complications.   Vitals with BMI 06/19/2020 06/03/2020 06/03/2020  Height 5\' 11"  - 5\' 11"   Weight 190 lbs - 190 lbs  BMI 20.25 - 42.70  Systolic - 623 762  Diastolic - 62 60  Pulse - - 81    Providers/Specialists:   NOTE: Primary physician provider listed below. Patient may have been seen by APP or partner within same practice.   PROVIDER ROLE / SPECIALTY LAST Anne Hahn, Dolores Lory, MD Vascular Surgery  05/27/2020  [name], MD Primary Care Provider  ???  Ida Rogue, MD Cardiology  06/03/2020   Allergies:  Patient has no known allergies.  Current Home Medications:   No current facility-administered medications for this encounter.   Marland Kitchen aspirin EC 81 MG tablet  . Multiple Vitamin (MULTIVITAMIN WITH MINERALS) TABS tablet  . nicotine polacrilex (COMMIT) 4 MG lozenge  . simvastatin (ZOCOR) 80 MG tablet   History:   Past Medical History:  Diagnosis Date  . Aneurysm of infrarenal abdominal aorta ([facility]) 05/10/2020   5.9 cm  . Aortic atherosclerosis ([place])   . Aortic valvar stenosis 02/08/2018   Mild to  Moderate Noted on ECHO  . Basal cell carcinoma    Face  . Carotid artery stenosis   . CKD (chronic kidney disease), stage III ([place])   . COPD (chronic obstructive pulmonary disease) ([place])   . Coronary artery disease   . DVT (deep venous thrombosis) ([place])    age 73, leg after Tonsillectomy  . Family history of adverse reaction to anesthesia    mother under anesthesia for extended period for AAA memory problems/dementia after  . Grade I diastolic dysfunction 83/1517  . Heart murmur   . Hyperlipidemia   . Myocardial infarction ([place]) 07/1983  . OA (osteoarthritis)    Past Surgical History:  Procedure Laterality Date  . CARDIAC CATHETERIZATION  08/14/1983  . CARDIOVASCULAR STRESS TEST  01/23/2011   normal myocardial perfusion scan demonstrating an attenuation artifact in the inferior region of the myocardium. no ischemia or infarct/scar seen in remaining myocardium  . CAROTID DUPLEX  05/21/2006   right bulb-0-49% diameter reduction; left bulb and proximal ICA-0-49% diameter reduction  . CATARACT EXTRACTION, BILATERAL  01/2019  . CORONARY ANGIOPLASTY  07/1983   PTCA Prox RCA  . DOPPLER ECHOCARDIOGRAPHY  07/21/2012   EF 50-55%, aortic valve noncoronary cusp mobility was severely restricted.  Marland Kitchen ROTATOR CUFF REPAIR Left   . TEMPORARY PACEMAKER  07/1983  . TONSILLECTOMY    . TOTAL HIP ARTHROPLASTY Left 02/21/2019   Procedure: TOTAL HIP ARTHROPLASTY ANTERIOR APPROACH;  Surgeon: Paralee Cancel, MD;  Location: [facility];  Service:  Orthopedics;  Laterality: Left;  70 mins   Family History  Problem Relation Age of Onset  . Heart Problems Mother   . AAA (abdominal aortic aneurysm) Mother    Social History   Tobacco Use  . Smoking status: Former Smoker    Packs/day: 2.00    Years: 50.00    Pack years: 100.00    Types: Cigarettes  . Smokeless tobacco: Never Used  . Tobacco comment: quit 14 years ago  Vaping Use  . Vaping Use: Never used  Substance Use Topics  . Alcohol use: Not Currently  .  Drug use: Never    Pertinent Clinical Results:  LABS: Labs reviewed: Acceptable for surgery.  No visits with results within 3 Day(s) from this visit.  Latest known visit with results is:  Hospital Outpatient Visit on 06/20/2020  Component Date Value Ref Range Status  . WBC 06/20/2020 7.5  4.0 - 10.5 K/uL Final  . RBC 06/20/2020 4.33  4.22 - 5.81 MIL/uL Final  . Hemoglobin 06/20/2020 13.6  13.0 - 17.0 g/dL Final  . HCT 06/20/2020 41.0  39.0 - 52.0 % Final  . MCV 06/20/2020 94.7  80.0 - 100.0 fL Final  . MCH 06/20/2020 31.4  26.0 - 34.0 pg Final  . MCHC 06/20/2020 33.2  30.0 - 36.0 g/dL Final  . RDW 06/20/2020 12.8  11.5 - 15.5 % Final  . Platelets 06/20/2020 267  150 - 400 K/uL Final  . nRBC 06/20/2020 0.0  0.0 - 0.2 % Final  . Neutrophils Relative % 06/20/2020 55  % Final  . Neutro Abs 06/20/2020 4.1  1.7 - 7.7 K/uL Final  . Lymphocytes Relative 06/20/2020 25  % Final  . Lymphs Abs 06/20/2020 1.9  0.7 - 4.0 K/uL Final  . Monocytes Relative 06/20/2020 16  % Final  . Monocytes Absolute 06/20/2020 1.2* 0.1 - 1.0 K/uL Final  . Eosinophils Relative 06/20/2020 3  % Final  . Eosinophils Absolute 06/20/2020 0.2  0.0 - 0.5 K/uL Final  . Basophils Relative 06/20/2020 1  % Final  . Basophils Absolute 06/20/2020 0.0  0.0 - 0.1 K/uL Final  . Immature Granulocytes 06/20/2020 0  % Final  . Abs Immature Granulocytes 06/20/2020 0.02  0.00 - 0.07 K/uL Final   Performed at [facility], [address], [place], [place] [address]  . Sodium 06/20/2020 137  135 - 145 mmol/L Final  . Potassium 06/20/2020 4.0  3.5 - 5.1 mmol/L Final  . Chloride 06/20/2020 105  98 - 111 mmol/L Final  . CO2 06/20/2020 24  22 - 32 mmol/L Final  . Glucose, Bld 06/20/2020 119* 70 - 99 mg/dL Final   Glucose reference range applies only to samples taken after fasting for at least 8 hours.  . BUN 06/20/2020 14  8 - 23 mg/dL Final  . Creatinine, Ser 06/20/2020 1.13  0.61 - 1.24 mg/dL Final  . Calcium 06/20/2020  8.7* 8.9 - 10.3 mg/dL Final  . GFR, Estimated 06/20/2020 >60  >60 mL/min Final   Comment: (NOTE) Calculated using the CKD-EPI Creatinine Equation (2021)   . Anion gap 06/20/2020 8  5 - 15 Final   Performed at [facility], [address] [place], [place] [address]  . Prothrombin Time 06/20/2020 13.8  11.4 - 15.2 seconds Final  . INR 06/20/2020 1.1  0.8 - 1.2 Final   Comment: (NOTE) INR goal varies based on device and disease states. Performed at [facility], [address] [place], [place] [address]   .  aPTT 06/20/2020 35  24 - 36 seconds Final   Performed at [facility], [address] [place], [place] [address]  . ABO/RH(D) 06/20/2020 A POS   Final  . Antibody Screen 06/20/2020 NEG   Final  . Sample Expiration 06/20/2020 07/04/2020,2359   Final  . Extend sample reason 06/20/2020    Final                   Value:NO TRANSFUSIONS OR PREGNANCY IN THE PAST 3 MONTHS Performed at [facility], [address] [place], [place] [address]     ECG: Date: 06/03/2020 Time ECG obtained: 1529 PM Rate: 81 bpm Rhythm: normal sinus Axis (leads I and aVF): Normal Intervals: PR 81 ms. QRS 80 ms. QTc 441 ms. ST segment and T wave changes: No evidence of acute ST segment elevation or depression Comparison: Similar to previous tracing obtained on 02/15/2019   IMAGING / PROCEDURES: ECHOCARDIOGRAM performed on 06/14/2020 1. Left ventricular ejection fraction, by estimation, is 60 to 65%.  2. The left ventricle has normal function.  3. The left ventricle has no regional wall motion abnormalities.  4. Left ventricular diastolic parameters are consistent with Grade I diastolic dysfunction (impaired relaxation).  5. Right ventricular systolic function is normal.  6. The right ventricular size is normal.  7. Tricuspid regurgitation signal is inadequate for assessing PA pressure.  8. The mitral valve is normal in structure. Trivial mitral valve  regurgitation. No evidence of mitral stenosis.  9. The aortic valve has an indeterminant number of cusps. Aortic valve regurgitation is mild. Moderate aortic valve stenosis.  10. Aortic dilatation noted. There is borderline dilatation of the ascending aorta, measuring 38 mm.  11. The inferior vena cava is normal in size with greater than 50% respiratory variability, suggesting right atrial pressure of 3 mmHg.  BILATERAL CAROTID DOPPLER performed on 06/10/2020 1. Right Carotid:   Velocities in the right ICA are consistent with a 1-39% stenosis.  Non-hemodynamically significant plaque <50% noted in the CCA.  2. Left Carotid:   Evidence consistent with a total occlusion of the left ICA.  Non-hemodynamically significant plaque <50% noted in the CCA.  3. Vertebrals:    Bilateral vertebral arteries demonstrate antegrade flow.  4. Subclavians:   Normal flow hemodynamics were seen in bilateral subclavian arteries.   MYOCARDIAL PERFUSION IMAGING STUDY performed on 01/23/2011 1. Perfusion defect in the inferior myocardial region is consistent with diaphragmatic attenuation.   2. The remaining myocardium demonstrates normal myocardial perfusion with no evidence of ischemia or infarct.   3. Post-rest left ventricle is normal in size 4. The post-rest ejection fraction is 65%.   5. Global left ventricular systolic function is normal.   6. Exercise capacity 10 METS. 7. This is a low risk study  LEFT HEART CATHETERIZATION AND CORONARY ANGIOGRAPHY performed on 08/14/1983 1. LAD was a small vessel without intrinsic lesions 2. LCx was a codominant vessel with a very large hybrid marginal system 3. RCA with CTO at the proximal third.  This was easily transversed with a guidewire and dilatation was performed leaving a residual 80% stenosis.  4. It should be pointed out that the patient had an apparent takeoff of his RCA coming off somewhat high and posteriorly  Impression and Plan:  Garrett Moore  has been referred for pre-anesthesia review and clearance prior to him undergoing the planned anesthetic and procedural courses. Available labs, pertinent testing, and imaging results were personally reviewed by me. This patient has  been appropriately cleared by cardiology with an overall ACCEPTABLE risk of significant perioperative cardiovascular complications.  Based on clinical review performed today (07/01/20), barring any significant acute changes in the patient's overall condition, it is anticipated that he will be able to proceed with the planned surgical intervention. Any acute changes in clinical condition may necessitate his procedure being postponed and/or cancelled. Pre-surgical instructions were reviewed with the patient during his PAT appointment and questions were fielded by PAT clinical staff.  Honor Loh, MSN, APRN, FNP-C, CEN [facility]  Peri-operative Services Nurse Practitioner Phone: [phone] 07/01/20 4:34 PM  NOTE: This note has been prepared using Dragon dictation software. Despite my best ability to proofread, there is always the potential that unintentional transcriptional errors may still occur from this process.

## 2020-06-24 ENCOUNTER — Telehealth: Payer: Self-pay | Admitting: *Deleted

## 2020-06-24 ENCOUNTER — Other Ambulatory Visit: Payer: Medicare HMO

## 2020-06-24 ENCOUNTER — Encounter: Payer: Self-pay | Admitting: Vascular Surgery

## 2020-06-24 NOTE — Telephone Encounter (Signed)
Hi Dr. [name],  Patient is scheduled for EVAR/stent graft on 07/03/2020. He has a history of CAD s/p remote PTCA of RCA in 1985, AAA, mild to moderate bilateral carotid stenosis, and moderate AS. You recently saw him in the office on 06/03/2020 for pre-op evaluation and ordered Echo . Echo showed normal LV function and moderate AS. Patient was felt to be at acceptable risk for surgery.  Can you please comment on how long patient can hold Aspirin?  Please route response back to P CV DIV PREOP.   Thank you! Ramzi Brathwaite

## 2020-06-24 NOTE — Telephone Encounter (Signed)
Request for pre-operative cardiac clearance Received: Today Karen Kitchens, NP  P Cv Div [facility] Request for pre-operative cardiac clearance:    1. What type of surgery is being performed?  EVAR/STENT GRAFT   2. When is this surgery scheduled?  07/03/2020    3. Are there any medications that need to be held prior to surgery?  ASA per cardiology recommendation   4. Practice name and name of physician performing surgery?  Performing surgeon: [name], MD  Requesting clearance: Honor Loh, FNP-C     5. Anesthesia type (none, local, MAC, general)? General   6. What is the office phone and fax number?   Phone: [phone]  Fax: [phone]   ATTENTION: Unable to create telephone message as per your standard workflow. Directed by [facility] providers to send requests for cardiac clearance to this pool for appropriate distribution to provider covering pre-operative clearances.   Honor Loh, MSN, APRN, FNP-C, CEN  [facility]  Peri-operative Services Nurse Practitioner  Phone: [phone]  06/24/20 1:37 PM

## 2020-06-26 DIAGNOSIS — I714 Abdominal aortic aneurysm, without rupture: Secondary | ICD-10-CM

## 2020-06-27 NOTE — Telephone Encounter (Signed)
Honor Loh, NP sent staff message checking on clearance request as procedure is Wed 4/13 and tomorrow is Friday 4/8.

## 2020-07-01 ENCOUNTER — Other Ambulatory Visit
Admission: RE | Admit: 2020-07-01 | Discharge: 2020-07-01 | Disposition: A | Discharge status: Home / Self Care | Payer: Medicare HMO | Source: Ambulatory Visit | Attending: Vascular Surgery | Admitting: Vascular Surgery

## 2020-07-01 ENCOUNTER — Other Ambulatory Visit: Payer: Self-pay

## 2020-07-01 DIAGNOSIS — Z96642 Presence of left artificial hip joint: Secondary | ICD-10-CM | POA: Diagnosis present

## 2020-07-01 DIAGNOSIS — E785 Hyperlipidemia, unspecified: Secondary | ICD-10-CM | POA: Diagnosis present

## 2020-07-01 DIAGNOSIS — I745 Embolism and thrombosis of iliac artery: Secondary | ICD-10-CM | POA: Diagnosis present

## 2020-07-01 DIAGNOSIS — I2510 Atherosclerotic heart disease of native coronary artery without angina pectoris: Secondary | ICD-10-CM | POA: Diagnosis present

## 2020-07-01 DIAGNOSIS — I252 Old myocardial infarction: Secondary | ICD-10-CM | POA: Diagnosis not present

## 2020-07-01 DIAGNOSIS — J449 Chronic obstructive pulmonary disease, unspecified: Secondary | ICD-10-CM | POA: Diagnosis present

## 2020-07-01 DIAGNOSIS — Z79899 Other long term (current) drug therapy: Secondary | ICD-10-CM | POA: Diagnosis not present

## 2020-07-01 DIAGNOSIS — M1990 Unspecified osteoarthritis, unspecified site: Secondary | ICD-10-CM | POA: Diagnosis present

## 2020-07-01 DIAGNOSIS — Z9841 Cataract extraction status, right eye: Secondary | ICD-10-CM | POA: Diagnosis not present

## 2020-07-01 DIAGNOSIS — Z7982 Long term (current) use of aspirin: Secondary | ICD-10-CM | POA: Diagnosis not present

## 2020-07-01 DIAGNOSIS — Z01812 Encounter for preprocedural laboratory examination: Secondary | ICD-10-CM | POA: Insufficient documentation

## 2020-07-01 DIAGNOSIS — Z9842 Cataract extraction status, left eye: Secondary | ICD-10-CM | POA: Diagnosis not present

## 2020-07-01 DIAGNOSIS — I714 Abdominal aortic aneurysm, without rupture: Secondary | ICD-10-CM | POA: Diagnosis present

## 2020-07-01 DIAGNOSIS — I700 Atherosclerosis of aorta: Secondary | ICD-10-CM | POA: Diagnosis present

## 2020-07-01 DIAGNOSIS — Z20822 Contact with and (suspected) exposure to covid-19: Secondary | ICD-10-CM | POA: Diagnosis present

## 2020-07-01 DIAGNOSIS — Z86718 Personal history of other venous thrombosis and embolism: Secondary | ICD-10-CM | POA: Diagnosis not present

## 2020-07-01 DIAGNOSIS — N183 Chronic kidney disease, stage 3 unspecified: Secondary | ICD-10-CM | POA: Diagnosis present

## 2020-07-01 DIAGNOSIS — Z87891 Personal history of nicotine dependence: Secondary | ICD-10-CM | POA: Diagnosis not present

## 2020-07-01 DIAGNOSIS — Z85828 Personal history of other malignant neoplasm of skin: Secondary | ICD-10-CM | POA: Diagnosis not present

## 2020-07-01 DIAGNOSIS — I6529 Occlusion and stenosis of unspecified carotid artery: Secondary | ICD-10-CM | POA: Diagnosis present

## 2020-07-01 DIAGNOSIS — Z9861 Coronary angioplasty status: Secondary | ICD-10-CM | POA: Diagnosis not present

## 2020-07-01 DIAGNOSIS — I723 Aneurysm of iliac artery: Secondary | ICD-10-CM | POA: Diagnosis present

## 2020-07-01 LAB — SARS CORONAVIRUS 2 (TAT 6-24 HRS): SARS Coronavirus 2: NEGATIVE

## 2020-07-01 NOTE — Telephone Encounter (Signed)
Dr. [name], please see conversation below regarding pre-op recommendations for ASA. Thanks.   Please re-route to P CV DIV PREOP.

## 2020-07-02 NOTE — Telephone Encounter (Signed)
Acceptable risk for procedure Echo with normal EF, moderate aortic valve stenosis No further cardiac workup needed Would stay on asa if possible Thx TGollan

## 2020-07-03 ENCOUNTER — Encounter: Payer: Self-pay | Admitting: Vascular Surgery

## 2020-07-03 ENCOUNTER — Other Ambulatory Visit: Payer: Self-pay

## 2020-07-03 ENCOUNTER — Encounter
Admission: RE | Disposition: A | Discharge status: Home / Self Care | Payer: Self-pay | Source: Home / Self Care | Attending: Vascular Surgery

## 2020-07-03 ENCOUNTER — Inpatient Hospital Stay: Payer: Medicare HMO | Admitting: Urgent Care

## 2020-07-03 ENCOUNTER — Inpatient Hospital Stay
Admission: RE | Admit: 2020-07-03 | Discharge: 2020-07-04 | DRG: 269 | Disposition: A | Discharge status: Home / Self Care | Payer: Medicare HMO | Attending: Vascular Surgery | Admitting: Vascular Surgery

## 2020-07-03 DIAGNOSIS — N183 Chronic kidney disease, stage 3 unspecified: Secondary | ICD-10-CM | POA: Diagnosis present

## 2020-07-03 DIAGNOSIS — I700 Atherosclerosis of aorta: Secondary | ICD-10-CM | POA: Diagnosis present

## 2020-07-03 DIAGNOSIS — Z85828 Personal history of other malignant neoplasm of skin: Secondary | ICD-10-CM

## 2020-07-03 DIAGNOSIS — Z95828 Presence of other vascular implants and grafts: Secondary | ICD-10-CM

## 2020-07-03 DIAGNOSIS — I714 Abdominal aortic aneurysm, without rupture, unspecified: Secondary | ICD-10-CM | POA: Diagnosis present

## 2020-07-03 DIAGNOSIS — I6529 Occlusion and stenosis of unspecified carotid artery: Secondary | ICD-10-CM | POA: Diagnosis present

## 2020-07-03 DIAGNOSIS — J449 Chronic obstructive pulmonary disease, unspecified: Secondary | ICD-10-CM | POA: Diagnosis present

## 2020-07-03 DIAGNOSIS — Z86718 Personal history of other venous thrombosis and embolism: Secondary | ICD-10-CM | POA: Diagnosis not present

## 2020-07-03 DIAGNOSIS — Z79899 Other long term (current) drug therapy: Secondary | ICD-10-CM

## 2020-07-03 DIAGNOSIS — Z87891 Personal history of nicotine dependence: Secondary | ICD-10-CM | POA: Diagnosis not present

## 2020-07-03 DIAGNOSIS — M1990 Unspecified osteoarthritis, unspecified site: Secondary | ICD-10-CM | POA: Diagnosis present

## 2020-07-03 DIAGNOSIS — Z20822 Contact with and (suspected) exposure to covid-19: Secondary | ICD-10-CM | POA: Diagnosis present

## 2020-07-03 DIAGNOSIS — Z9842 Cataract extraction status, left eye: Secondary | ICD-10-CM | POA: Diagnosis not present

## 2020-07-03 DIAGNOSIS — I252 Old myocardial infarction: Secondary | ICD-10-CM | POA: Diagnosis not present

## 2020-07-03 DIAGNOSIS — E785 Hyperlipidemia, unspecified: Secondary | ICD-10-CM | POA: Diagnosis present

## 2020-07-03 DIAGNOSIS — I723 Aneurysm of iliac artery: Secondary | ICD-10-CM | POA: Diagnosis present

## 2020-07-03 DIAGNOSIS — Z9841 Cataract extraction status, right eye: Secondary | ICD-10-CM

## 2020-07-03 DIAGNOSIS — Z9861 Coronary angioplasty status: Secondary | ICD-10-CM

## 2020-07-03 DIAGNOSIS — I745 Embolism and thrombosis of iliac artery: Secondary | ICD-10-CM | POA: Diagnosis present

## 2020-07-03 DIAGNOSIS — Z7982 Long term (current) use of aspirin: Secondary | ICD-10-CM

## 2020-07-03 DIAGNOSIS — Z96642 Presence of left artificial hip joint: Secondary | ICD-10-CM | POA: Diagnosis present

## 2020-07-03 DIAGNOSIS — I2510 Atherosclerotic heart disease of native coronary artery without angina pectoris: Secondary | ICD-10-CM | POA: Diagnosis present

## 2020-07-03 HISTORY — PX: ENDOVASCULAR REPAIR/STENT GRAFT: CATH118280

## 2020-07-03 HISTORY — DX: Presence of other vascular implants and grafts: Z95.828

## 2020-07-03 HISTORY — DX: Atherosclerosis of aorta: I70.0

## 2020-07-03 HISTORY — DX: Aneurysm of iliac artery: I72.3

## 2020-07-03 HISTORY — DX: Occlusion and stenosis of unspecified carotid artery: I65.29

## 2020-07-03 LAB — MRSA PCR SCREENING: MRSA by PCR: NEGATIVE

## 2020-07-03 LAB — GLUCOSE, CAPILLARY: Glucose-Capillary: 117 mg/dL — ABNORMAL HIGH (ref 70–99)

## 2020-07-03 SURGERY — ENDOVASCULAR REPAIR/STENT GRAFT
Anesthesia: General

## 2020-07-03 MED ORDER — ASPIRIN EC 81 MG PO TBEC
81.0000 mg | DELAYED_RELEASE_TABLET | Freq: Every day | ORAL | Status: DC
  Administered 2020-07-03 – 2020-07-04 (×2): 81 mg via ORAL
  Filled 2020-07-03 (×2): qty 1

## 2020-07-03 MED ORDER — CHLORHEXIDINE GLUCONATE CLOTH 2 % EX PADS
6.0000 | MEDICATED_PAD | Freq: Once | CUTANEOUS | Status: AC
  Administered 2020-07-03: 6 via TOPICAL

## 2020-07-03 MED ORDER — LIDOCAINE HCL (PF) 2 % IJ SOLN
INTRAMUSCULAR | Status: AC
  Filled 2020-07-03: qty 5

## 2020-07-03 MED ORDER — ROCURONIUM BROMIDE 100 MG/10ML IV SOLN
INTRAVENOUS | Status: DC | PRN
  Administered 2020-07-03: 10 mg via INTRAVENOUS
  Administered 2020-07-03: 20 mg via INTRAVENOUS
  Administered 2020-07-03: 50 mg via INTRAVENOUS
  Administered 2020-07-03 (×2): 10 mg via INTRAVENOUS

## 2020-07-03 MED ORDER — NITROGLYCERIN IN D5W 200-5 MCG/ML-% IV SOLN: 5.0000 ug/min | INTRAVENOUS | Status: DC

## 2020-07-03 MED ORDER — CHLORHEXIDINE GLUCONATE 0.12 % MT SOLN
15.0000 mL | Freq: Once | OROMUCOSAL | Status: DC
  Filled 2020-07-03: qty 15

## 2020-07-03 MED ORDER — ATORVASTATIN CALCIUM 20 MG PO TABS
40.0000 mg | ORAL_TABLET | Freq: Every day | ORAL | Status: DC
  Administered 2020-07-03 – 2020-07-04 (×2): 40 mg via ORAL
  Filled 2020-07-03 (×2): qty 2

## 2020-07-03 MED ORDER — HYDROMORPHONE HCL 1 MG/ML IJ SOLN: 1.0000 mg | Freq: Once | INTRAMUSCULAR | Status: DC | PRN

## 2020-07-03 MED ORDER — POTASSIUM CHLORIDE CRYS ER 20 MEQ PO TBCR: 20.0000 meq | EXTENDED_RELEASE_TABLET | Freq: Every day | ORAL | Status: DC | PRN

## 2020-07-03 MED ORDER — CEFAZOLIN SODIUM-DEXTROSE 2-4 GM/100ML-% IV SOLN
2.0000 g | INTRAVENOUS | Status: AC
  Administered 2020-07-03: 2 g via INTRAVENOUS

## 2020-07-03 MED ORDER — EPHEDRINE SULFATE 50 MG/ML IJ SOLN
INTRAMUSCULAR | Status: DC | PRN
  Administered 2020-07-03: 5 mg via INTRAVENOUS

## 2020-07-03 MED ORDER — PHENYLEPHRINE HCL (PRESSORS) 10 MG/ML IV SOLN
INTRAVENOUS | Status: DC | PRN
  Administered 2020-07-03: 100 ug via INTRAVENOUS
  Administered 2020-07-03: 150 ug via INTRAVENOUS
  Administered 2020-07-03 (×2): 50 ug via INTRAVENOUS
  Administered 2020-07-03: 100 ug via INTRAVENOUS
  Administered 2020-07-03 (×2): 50 ug via INTRAVENOUS
  Administered 2020-07-03: 100 ug via INTRAVENOUS
  Administered 2020-07-03 (×3): 50 ug via INTRAVENOUS

## 2020-07-03 MED ORDER — KETAMINE HCL 50 MG/5ML IJ SOSY
PREFILLED_SYRINGE | INTRAMUSCULAR | Status: AC
  Filled 2020-07-03: qty 5

## 2020-07-03 MED ORDER — GUAIFENESIN-DM 100-10 MG/5ML PO SYRP: 15.0000 mL | ORAL_SOLUTION | ORAL | Status: DC | PRN

## 2020-07-03 MED ORDER — DEXAMETHASONE SODIUM PHOSPHATE 10 MG/ML IJ SOLN
INTRAMUSCULAR | Status: AC
  Filled 2020-07-03: qty 1

## 2020-07-03 MED ORDER — ACETAMINOPHEN 650 MG RE SUPP
325.0000 mg | RECTAL | Status: DC | PRN
Start: 2020-07-03 — End: 2020-07-04

## 2020-07-03 MED ORDER — CEFAZOLIN SODIUM-DEXTROSE 2-4 GM/100ML-% IV SOLN
2.0000 g | Freq: Three times a day (TID) | INTRAVENOUS | Status: AC
  Administered 2020-07-03 – 2020-07-04 (×2): 2 g via INTRAVENOUS
  Filled 2020-07-03 (×2): qty 100

## 2020-07-03 MED ORDER — HEPARIN SODIUM (PORCINE) 1000 UNIT/ML IJ SOLN
INTRAMUSCULAR | Status: DC | PRN
  Administered 2020-07-03: 6000 [IU] via INTRAVENOUS
  Administered 2020-07-03: 2000 [IU] via INTRAVENOUS

## 2020-07-03 MED ORDER — FENTANYL CITRATE (PF) 100 MCG/2ML IJ SOLN
25.0000 ug | INTRAMUSCULAR | Status: DC | PRN
  Administered 2020-07-03: 50 ug via INTRAVENOUS

## 2020-07-03 MED ORDER — HEPARIN SODIUM (PORCINE) 1000 UNIT/ML IJ SOLN
INTRAMUSCULAR | Status: AC
  Filled 2020-07-03: qty 1

## 2020-07-03 MED ORDER — LABETALOL HCL 5 MG/ML IV SOLN
10.0000 mg | INTRAVENOUS | Status: DC | PRN
Start: 2020-07-03 — End: 2020-07-04

## 2020-07-03 MED ORDER — DOPAMINE-DEXTROSE 3.2-5 MG/ML-% IV SOLN: 3.0000 ug/kg/min | INTRAVENOUS | Status: DC

## 2020-07-03 MED ORDER — OXYCODONE HCL 5 MG/5ML PO SOLN
5.0000 mg | Freq: Once | ORAL | Status: DC | PRN
  Filled 2020-07-03: qty 5

## 2020-07-03 MED ORDER — IODIXANOL 320 MG/ML IV SOLN
INTRAVENOUS | Status: DC | PRN
  Administered 2020-07-03: 80 mL

## 2020-07-03 MED ORDER — PHENOL 1.4 % MT LIQD
1.0000 | OROMUCOSAL | Status: DC | PRN
  Filled 2020-07-03: qty 177

## 2020-07-03 MED ORDER — ONDANSETRON HCL 4 MG/2ML IJ SOLN: 4.0000 mg | Freq: Once | INTRAMUSCULAR | Status: DC | PRN

## 2020-07-03 MED ORDER — ACETAMINOPHEN 325 MG PO TABS: 325.0000 mg | ORAL_TABLET | ORAL | Status: DC | PRN

## 2020-07-03 MED ORDER — FENTANYL CITRATE (PF) 100 MCG/2ML IJ SOLN
INTRAMUSCULAR | Status: AC
  Filled 2020-07-03: qty 2

## 2020-07-03 MED ORDER — CLOPIDOGREL BISULFATE 75 MG PO TABS
75.0000 mg | ORAL_TABLET | Freq: Every day | ORAL | Status: DC
  Administered 2020-07-04: 75 mg via ORAL
  Filled 2020-07-03: qty 1

## 2020-07-03 MED ORDER — VANCOMYCIN HCL 1000 MG/200ML IV SOLN
1000.0000 mg | Freq: Two times a day (BID) | INTRAVENOUS | Status: AC
  Administered 2020-07-03 – 2020-07-04 (×2): 1000 mg via INTRAVENOUS
  Filled 2020-07-03 (×2): qty 200

## 2020-07-03 MED ORDER — SUGAMMADEX SODIUM 200 MG/2ML IV SOLN
INTRAVENOUS | Status: DC | PRN
  Administered 2020-07-03: 200 mg via INTRAVENOUS

## 2020-07-03 MED ORDER — SODIUM CHLORIDE 0.9 % IV SOLN: INTRAVENOUS | Status: DC

## 2020-07-03 MED ORDER — DOCUSATE SODIUM 100 MG PO CAPS
100.0000 mg | ORAL_CAPSULE | Freq: Every day | ORAL | Status: DC
  Administered 2020-07-04: 100 mg via ORAL
  Filled 2020-07-03: qty 1

## 2020-07-03 MED ORDER — ROCURONIUM BROMIDE 10 MG/ML (PF) SYRINGE
PREFILLED_SYRINGE | INTRAVENOUS | Status: AC
  Filled 2020-07-03: qty 10

## 2020-07-03 MED ORDER — NICOTINE POLACRILEX 4 MG MT LOZG
4.0000 mg | LOZENGE | OROMUCOSAL | Status: DC | PRN
  Filled 2020-07-03: qty 1

## 2020-07-03 MED ORDER — ONDANSETRON HCL 4 MG/2ML IJ SOLN: 4.0000 mg | Freq: Four times a day (QID) | INTRAMUSCULAR | Status: DC | PRN

## 2020-07-03 MED ORDER — MORPHINE SULFATE (PF) 2 MG/ML IV SOLN: 2.0000 mg | INTRAVENOUS | Status: DC | PRN

## 2020-07-03 MED ORDER — OXYCODONE HCL 5 MG PO TABS: 5.0000 mg | ORAL_TABLET | Freq: Once | ORAL | Status: DC | PRN

## 2020-07-03 MED ORDER — SODIUM CHLORIDE 0.9 % IV SOLN: 500.0000 mL | Freq: Once | INTRAVENOUS | Status: DC | PRN

## 2020-07-03 MED ORDER — ONDANSETRON HCL 4 MG/2ML IJ SOLN
INTRAMUSCULAR | Status: AC
  Filled 2020-07-03: qty 2

## 2020-07-03 MED ORDER — DEXAMETHASONE SODIUM PHOSPHATE 10 MG/ML IJ SOLN
INTRAMUSCULAR | Status: DC | PRN
  Administered 2020-07-03: 10 mg via INTRAVENOUS

## 2020-07-03 MED ORDER — LIDOCAINE HCL (CARDIAC) PF 100 MG/5ML IV SOSY
PREFILLED_SYRINGE | INTRAVENOUS | Status: DC | PRN
  Administered 2020-07-03: 80 mg via INTRAVENOUS

## 2020-07-03 MED ORDER — MAGNESIUM SULFATE 2 GM/50ML IV SOLN: 2.0000 g | Freq: Every day | INTRAVENOUS | Status: DC | PRN

## 2020-07-03 MED ORDER — OXYCODONE-ACETAMINOPHEN 5-325 MG PO TABS: 1.0000 | ORAL_TABLET | ORAL | Status: DC | PRN

## 2020-07-03 MED ORDER — SUCCINYLCHOLINE CHLORIDE 200 MG/10ML IV SOSY
PREFILLED_SYRINGE | INTRAVENOUS | Status: AC
  Filled 2020-07-03: qty 10

## 2020-07-03 MED ORDER — ALUM & MAG HYDROXIDE-SIMETH 200-200-20 MG/5ML PO SUSP: 15.0000 mL | ORAL | Status: DC | PRN

## 2020-07-03 MED ORDER — FENTANYL CITRATE (PF) 100 MCG/2ML IJ SOLN
INTRAMUSCULAR | Status: DC | PRN
  Administered 2020-07-03 (×2): 25 ug via INTRAVENOUS
  Administered 2020-07-03: 50 ug via INTRAVENOUS

## 2020-07-03 MED ORDER — ONDANSETRON HCL 4 MG/2ML IJ SOLN
4.0000 mg | Freq: Four times a day (QID) | INTRAMUSCULAR | Status: DC | PRN
  Administered 2020-07-03: 4 mg via INTRAVENOUS

## 2020-07-03 MED ORDER — HYDRALAZINE HCL 20 MG/ML IJ SOLN: 5.0000 mg | INTRAMUSCULAR | Status: DC | PRN

## 2020-07-03 MED ORDER — PROPOFOL 10 MG/ML IV BOLUS
INTRAVENOUS | Status: AC
  Filled 2020-07-03: qty 20

## 2020-07-03 MED ORDER — LACTATED RINGERS IV SOLN: INTRAVENOUS | Status: DC

## 2020-07-03 MED ORDER — ADULT MULTIVITAMIN W/MINERALS CH
1.0000 | ORAL_TABLET | Freq: Every day | ORAL | Status: DC
  Administered 2020-07-03 – 2020-07-04 (×2): 1 via ORAL
  Filled 2020-07-03 (×2): qty 1

## 2020-07-03 MED ORDER — ORAL CARE MOUTH RINSE: 15.0000 mL | Freq: Once | OROMUCOSAL | Status: DC

## 2020-07-03 MED ORDER — METOPROLOL TARTRATE 5 MG/5ML IV SOLN: 2.0000 mg | INTRAVENOUS | Status: DC | PRN

## 2020-07-03 MED ORDER — PROPOFOL 10 MG/ML IV BOLUS
INTRAVENOUS | Status: DC | PRN
  Administered 2020-07-03: 120 mg via INTRAVENOUS

## 2020-07-03 MED ORDER — FAMOTIDINE IN NACL 20-0.9 MG/50ML-% IV SOLN
20.0000 mg | Freq: Two times a day (BID) | INTRAVENOUS | Status: DC
  Administered 2020-07-03 – 2020-07-04 (×2): 20 mg via INTRAVENOUS
  Filled 2020-07-03 (×3): qty 50

## 2020-07-03 SURGICAL SUPPLY — 44 items
BALLN ARMADA 14X40X80 (BALLOONS) ×2
BALLN ULTRVRSE 10X60X75 (BALLOONS) ×2
BALLN ULTRVRSE 7X40X75C (BALLOONS) ×2
BALLN ULTRVRSE 8X80X75 (BALLOONS) ×2
CANNULA 5F STIFF (CANNULA) ×2
CATH ACCU-VU SIZ PIG 5F 70CM (CATHETERS) ×2
CATH BALLN CODA 9X100X32 (BALLOONS) ×2
CATH BEACON 5 .035 65 C2 TIP (CATHETERS) ×2
CATH KUMPE SOFT-VU 5FR 65 (CATHETERS) ×4
CLOSURE PERCLOSE PROSTYLE (VASCULAR PRODUCTS) ×14
COVER DRAPE FLUORO 36X44 (DRAPES) ×4
COVER PROBE U/S 5X48 (MISCELLANEOUS) ×2
DEVICE SAFEGUARD 24CM (GAUZE/BANDAGES/DRESSINGS) ×4
DEVICE TORQUE (MISCELLANEOUS) ×2
DRYSEAL FLEXSHEATH 12FR 45CM (SHEATH) ×1
DRYSEAL FLEXSHEATH 16FR 33CM (SHEATH) ×1
DRYSEAL FLEXSHEATH 18FR 33CM (SHEATH) ×1
ENDOPRO ILIAC 23X12X10 FA (Endovascular Graft) ×2 IMPLANT
ENDOPRO ILIAC HC 16X12X7 FA (Endovascular Graft) ×2 IMPLANT
ENSNARE 18-30 (MISCELLANEOUS) ×2
EXCLDR TRNK 28.5X14.5X12 16F (Endovascular Graft) ×2 IMPLANT
GLIDEWIRE ANGLED SS 035X260CM (WIRE) ×2
GLIDEWIRE STIFF .35X180X3 HYDR (WIRE) ×2
GUIDEWIRE ADV .018X180CM (WIRE) ×2
KIT ENCORE 26 ADVANTAGE (KITS) ×4
LEG CONTRALATERAL 16X12X10 (Vascular Products) ×2 IMPLANT
LEG CONTRALATERAL 16X12X14 (Vascular Products) ×1 IMPLANT
LEG CONTRALATERAL 27X12 (Vascular Products) ×2 IMPLANT
PACK ANGIOGRAPHY (CUSTOM PROCEDURE TRAY) ×2
SHEATH BRITE TIP 6FRX11 (SHEATH) ×4
SHEATH BRITE TIP 8FRX11 (SHEATH) ×4
SHEATH DRYSEAL FLEX 12FR 45CM (SHEATH) ×1
SHEATH DRYSEAL FLEX 16FR 33CM (SHEATH) ×1
SHEATH DRYSEAL FLEX 18FR 33CM (SHEATH) ×1
SPONGE XRAY 4X4 16PLY STRL (MISCELLANEOUS) ×8
STENT GRAFT CONTRALAT 16X12X10 (Vascular Products) ×1 IMPLANT
STENT GRAFT CONTRALAT 16X12X14 (Vascular Products) ×1 IMPLANT
STENT LIFESTREAM 12X38X80 (Permanent Stent) ×2 IMPLANT
SYR 30ML LL (SYRINGE) ×6
SYR MEDRAD MARK 7 150ML (SYRINGE) ×2
TUBING CONTRAST HIGH PRESS 72 (TUBING) ×2
WIRE AMPLATZ SSTIFF .035X260CM (WIRE) ×4
WIRE GUIDERIGHT .035X150 (WIRE) ×4 IMPLANT
WIRE ROSEN-J .035X260CM (WIRE) ×2

## 2020-07-03 NOTE — H&P (Signed)
[facility] Admission History & Physical  MRN : [id number]  Garrett Moore is a 80 y.o. (12-12-1940) male who presents with chief complaint of scheduled abdominal aortic aneurysm repair.  History of Present Illness:  The patient presents to the office for evaluation of an abdominal aortic aneurysm. The aneurysm was found incidentally by CT scan being done for evaluation of a kidney stone. Patient denies abdominal pain or unusual back pain, no other abdominal complaints.  No history of an acute onset of painful blue discoloration of the toes.     Patient denies amaurosis fugax or TIA symptoms. There is no history of claudication or rest pain symptoms of the lower extremities.  The patient denies angina or shortness of breath.  CT scan is reviewed by me and shows an AAA that measures 6.4cm  Patient presents for a scheduled abdominal aortic aneurysm repair.  Current Facility-Administered Medications  Medication Dose Route Frequency Provider Last Rate Last Admin  . ceFAZolin (ANCEF) IVPB 2g/100 mL premix  2 g Intravenous On Call to OR Kris Hartmann, NP      . chlorhexidine (PERIDEX) 0.12 % solution 15 mL  15 mL Mouth/Throat Once Tera Mater, MD       Or  . MEDLINE mouth rinse  15 mL Mouth Rinse Once Tera Mater, MD      . Chlorhexidine Gluconate Cloth 2 % PADS 6 each  6 each Topical Once Kris Hartmann, NP      . HYDROmorphone (DILAUDID) injection 1 mg  1 mg Intravenous Once PRN Kris Hartmann, NP      . lactated ringers infusion   Intravenous Continuous Tera Mater, MD 10 mL/hr at 07/03/20 1136 New Bag at 07/03/20 1136  . ondansetron (ZOFRAN) injection 4 mg  4 mg Intravenous Q6H PRN Kris Hartmann, NP       Past Medical History:  Diagnosis Date  . Aneurysm of infrarenal abdominal aorta (HCC) 05/10/2020   5.9 cm  . Aortic atherosclerosis ([place])   . Aortic valvar stenosis 02/08/2018   Mild to Moderate Noted on ECHO  . Basal cell  carcinoma    Face  . Carotid artery stenosis   . CKD (chronic kidney disease), stage III ([place])   . COPD (chronic obstructive pulmonary disease) ([place])   . Coronary artery disease   . DVT (deep venous thrombosis) ([place])    age 81, leg after Tonsillectomy  . Family history of adverse reaction to anesthesia    mother under anesthesia for extended period for AAA memory problems/dementia after  . Grade I diastolic dysfunction 32/2025  . Heart murmur   . Hyperlipidemia   . Myocardial infarction ([place]) 07/1983  . OA (osteoarthritis)    Past Surgical History:  Procedure Laterality Date  . CARDIAC CATHETERIZATION  08/14/1983  . CARDIOVASCULAR STRESS TEST  01/23/2011   normal myocardial perfusion scan demonstrating an attenuation artifact in the inferior region of the myocardium. no ischemia or infarct/scar seen in remaining myocardium  . CAROTID DUPLEX  05/21/2006   right bulb-0-49% diameter reduction; left bulb and proximal ICA-0-49% diameter reduction  . CATARACT EXTRACTION, BILATERAL  01/2019  . CORONARY ANGIOPLASTY  07/1983   PTCA Prox RCA  . DOPPLER ECHOCARDIOGRAPHY  07/21/2012   EF 50-55%, aortic valve noncoronary cusp mobility was severely restricted.  Marland Kitchen ROTATOR CUFF REPAIR Left   . TEMPORARY PACEMAKER  07/1983  . TONSILLECTOMY    . TOTAL HIP ARTHROPLASTY Left 02/21/2019   Procedure:  TOTAL HIP ARTHROPLASTY ANTERIOR APPROACH;  Surgeon: Paralee Cancel, MD;  Location: [facility];  Service: Orthopedics;  Laterality: Left;  70 mins   Social History Social History   Tobacco Use  . Smoking status: Former Smoker    Packs/day: 2.00    Years: 50.00    Pack years: 100.00    Types: Cigarettes  . Smokeless tobacco: Never Used  . Tobacco comment: quit 14 years ago  Vaping Use  . Vaping Use: Never used  Substance Use Topics  . Alcohol use: Not Currently  . Drug use: Never   Family History Family History  Problem Relation Age of Onset  . Heart Problems Mother   . AAA (abdominal aortic aneurysm)  Mother   States positive family history of abdominal aortic aneurysm in his mother.  Denies any peripheral artery or venous disease.  No Known Allergies  REVIEW OF SYSTEMS (Negative unless checked)  Constitutional: [] Weight loss  [] Fever  [] Chills Cardiac: [] Chest pain   [] Chest pressure   [] Palpitations   [] Shortness of breath when laying flat   [] Shortness of breath at rest   [] Shortness of breath with exertion. Vascular:  [] Pain in legs with walking   [] Pain in legs at rest   [] Pain in legs when laying flat   [] Claudication   [] Pain in feet when walking  [] Pain in feet at rest  [] Pain in feet when laying flat   [] History of DVT   [] Phlebitis   [x] Swelling in legs   [] Varicose veins   [] Non-healing ulcers Pulmonary:   [] Uses home oxygen   [] Productive cough   [] Hemoptysis   [] Wheeze  [] COPD   [] Asthma Neurologic:  [] Dizziness  [] Blackouts   [] Seizures   [] History of stroke   [] History of TIA  [] Aphasia   [] Temporary blindness   [] Dysphagia   [] Weakness or numbness in arms   [] Weakness or numbness in legs Musculoskeletal:  [x] Arthritis   [] Joint swelling   [] Joint pain   [] Low back pain Hematologic:  [] Easy bruising  [] Easy bleeding   [] Hypercoagulable state   [] Anemic  [] Hepatitis Gastrointestinal:  [] Blood in stool   [] Vomiting blood  [] Gastroesophageal reflux/heartburn   [] Difficulty swallowing. Genitourinary:  [x] Chronic kidney disease   [] Difficult urination  [] Frequent urination  [] Burning with urination   [] Blood in urine Skin:  [] Rashes   [] Ulcers   [] Wounds Psychological:  [] History of anxiety   []  History of major depression.  Physical Examination  Vitals:   07/03/20 1118  BP: 140/73  Pulse: 75  Resp: 19  Temp: 97.7 F (36.5 C)  SpO2: 96%  Weight: 86.2 kg  Height: 5\' 11"  (1.803 m)   Body mass index is 26.5 kg/m. Gen: WD/WN, NAD Head: [place]/AT, No temporalis wasting.  Ear/Nose/Throat: Hearing grossly intact, nares w/o erythema or drainage, oropharynx w/o  Erythema/Exudate, Eyes: Sclera non-icteric, conjunctiva clear Neck: Supple, no nuchal rigidity.  No JVD.  Pulmonary:  Good air movement, no increased work of respiration or use of accessory muscles  Cardiac: RRR, normal S1, S2, no Murmurs, rubs or gallops. Vascular:  Vessel Right Left  Radial Palpable Palpable  Ulnar Palpable Palpable  Brachial Palpable Palpable  Carotid Palpable, without bruit Palpable, without bruit  Aorta Not palpable N/A  Femoral Palpable Palpable  Popliteal Palpable Palpable  PT Palpable Palpable  DP Palpable Palpable   Gastrointestinal: soft, non-tender/non-distended. No guarding/reflex. No masses, surgical incisions, or scars. Musculoskeletal: M/S 5/5 throughout.  No deformity or atrophy.  Mild edema Neurologic: Sensation grossly intact in extremities.  Symmetrical.  Speech is fluent. Motor exam as listed above. Psychiatric: Judgment intact, Mood & affect appropriate for pt's clinical situation. Dermatologic: No rashes or ulcers noted.  No cellulitis or open wounds. Lymph : No Cervical, Axillary, or Inguinal lymphadenopathy.  CBC Lab Results  Component Value Date   WBC 7.5 06/20/2020   HGB 13.6 06/20/2020   HCT 41.0 06/20/2020   MCV 94.7 06/20/2020   PLT 267 06/20/2020   BMET    Component Value Date/Time   NA 137 06/20/2020 1324   K 4.0 06/20/2020 1324   CL 105 06/20/2020 1324   CO2 24 06/20/2020 1324   GLUCOSE 119 (H) 06/20/2020 1324   BUN 14 06/20/2020 1324   CREATININE 1.13 06/20/2020 1324   CALCIUM 8.7 (L) 06/20/2020 1324   GFRNONAA >60 06/20/2020 1324   GFRAA >60 02/22/2019 0236   Estimated Creatinine Clearance: 56.5 mL/min (by C-G formula based on SCr of 1.13 mg/dL).  COAG Lab Results  Component Value Date   INR 1.1 06/20/2020   Radiology ECHOCARDIOGRAM COMPLETE  Result Date: 06/14/2020    ECHOCARDIOGRAM REPORT   Patient Name:   HEBER HOOG [facility] Date of Exam: 06/14/2020 Medical Rec #:  [id number]       Height:       71.0 in Accession  #:    [id number]      Weight:       190.0 lb Date of Birth:  01-19-1941       BSA:          2.063 m Patient Age:    55 years        BP:           110/62 mmHg Patient Gender: M               HR:           71 bpm. Exam Location:  [facility] Procedure: 2D Echo, 3D Echo, Cardiac Doppler and Color Doppler Indications:    I35.0 Aortic Stenosis  History:        Patient has prior history of Echocardiogram examinations, most                 recent 02/08/2018. CAD and Previous Myocardial Infarction, COPD,                 Aortic Valve Disease, Signs/Symptoms:Murmur; Risk Factors:Family                 History of Coronary Artery Disease, Dyslipidemia and Former                 Smoker. Abdominal Aortic Aneurysm (pre-op eval, repair scheduled                 for next week)                  Chronic Kidney Disease, History of DVT.  Sonographer:    Deliah Boston RDCS Referring Phys: [place]  1. Left ventricular ejection fraction, by estimation, is 60 to 65%. The left ventricle has normal function. The left ventricle has no regional wall motion abnormalities. Left ventricular diastolic parameters are consistent with Grade I diastolic dysfunction (impaired relaxation).  2. Right ventricular systolic function is normal. The right ventricular size is normal. Tricuspid regurgitation signal is inadequate for assessing PA pressure.  3. The mitral valve is normal in structure. Trivial mitral valve regurgitation. No evidence of mitral stenosis.  4. The aortic valve has an indeterminant number  of cusps. Aortic valve regurgitation is mild. Moderate aortic valve stenosis.  5. Aortic dilatation noted. There is borderline dilatation of the ascending aorta, measuring 38 mm.  6. The inferior vena cava is normal in size with greater than 50% respiratory variability, suggesting right atrial pressure of 3 mmHg. FINDINGS  Left Ventricle: Left ventricular ejection fraction, by estimation, is 60 to 65%. The left ventricle  has normal function. The left ventricle has no regional wall motion abnormalities. The left ventricular internal cavity size was normal in size. There is  no left ventricular hypertrophy. Left ventricular diastolic parameters are consistent with Grade I diastolic dysfunction (impaired relaxation). Right Ventricle: The right ventricular size is normal. Right ventricular systolic function is normal. Tricuspid regurgitation signal is inadequate for assessing PA pressure. The tricuspid regurgitant velocity is 2.58 m/s, and with an assumed right atrial  pressure of 3 mmHg, the estimated right ventricular systolic pressure is 37.1 mmHg. Left Atrium: Left atrial size was normal in size. Right Atrium: Right atrial size was normal in size. Pericardium: There is no evidence of pericardial effusion. Mitral Valve: The mitral valve is normal in structure. Mild mitral annular calcification. Trivial mitral valve regurgitation. No evidence of mitral valve stenosis. Tricuspid Valve: The tricuspid valve is normal in structure. Tricuspid valve regurgitation is trivial. No evidence of tricuspid stenosis. Aortic Valve: The aortic valve has an indeterminant number of cusps. Aortic valve regurgitation is mild. Aortic regurgitation PHT measures 458 msec. Moderate aortic stenosis is present. Aortic valve mean gradient measures 26.8 mmHg. Aortic valve peak gradient measures 50.4 mmHg. Aortic valve area, by VTI measures 1.58 cm. Pulmonic Valve: The pulmonic valve was not well visualized. Pulmonic valve regurgitation is not visualized. No evidence of pulmonic stenosis. Aorta: Aortic dilatation noted. There is borderline dilatation of the ascending aorta, measuring 38 mm. Venous: The inferior vena cava is normal in size with greater than 50% respiratory variability, suggesting right atrial pressure of 3 mmHg. IAS/Shunts: No atrial level shunt detected by color flow Doppler.  LEFT VENTRICLE PLAX 2D LVIDd:         3.70 cm  Diastology LVIDs:          2.70 cm  LV e' medial:    6.64 cm/s LV PW:         0.95 cm  LV E/e' medial:  10.6 LV IVS:        0.75 cm  LV e' lateral:   6.64 cm/s LVOT diam:     2.60 cm  LV E/e' lateral: 10.6 LV SV:         122 LV SV Index:   59 LVOT Area:     5.31 cm                          3D Volume EF:                         3D EF:        63 %                         LV EDV:       138 ml                         LV ESV:       51 ml  LV SV:        86 ml RIGHT VENTRICLE RV S prime:     12.10 cm/s TAPSE (M-mode): 2.4 cm LEFT ATRIUM           Index LA diam:      3.70 cm 1.79 cm/m LA Vol (A2C): 53.3 ml 25.84 ml/m  AORTIC VALVE AV Area (Vmax):    1.66 cm AV Area (Vmean):   1.50 cm AV Area (VTI):     1.58 cm AV Vmax:           355.00 cm/s AV Vmean:          238.500 cm/s AV VTI:            0.775 m AV Peak Grad:      50.4 mmHg AV Mean Grad:      26.8 mmHg LVOT Vmax:         111.00 cm/s LVOT Vmean:        67.350 cm/s LVOT VTI:          0.230 m LVOT/AV VTI ratio: 0.30 AI PHT:            458 msec  AORTA Ao Root diam: 3.90 cm Ao Asc diam:  3.90 cm MITRAL VALVE               TRICUSPID VALVE MV Area (PHT): cm         TR Peak grad:   26.6 mmHg MV Decel Time: 307 msec    TR Vmax:        258.00 cm/s MV E velocity: 70.70 cm/s MV A velocity: 85.20 cm/s  SHUNTS MV E/A ratio:  0.83        Systemic VTI:  0.23 m                            Systemic Diam: 2.60 cm Kirk Ruths MD Electronically signed by Kirk Ruths MD Signature Date/Time: 06/14/2020/4:21:25 PM    Final    VAS US CAROTID  Result Date: 06/11/2020 Carotid Arterial Duplex Study Indications:       Carotid artery disease. Risk Factors:      Hyperlipidemia, no history of smoking, coronary artery                    disease. Other Factors:     Patient denies any cerebrovascular symptoms. Comparison Study:  In 07/2012, a carotid dupex showed RICA velocity of 81/31 cm/s                    and a LICA velocity of 235/57 cm/s Performing Technologist: Wilkie Aye RVT   Examination Guidelines: A complete evaluation includes B-mode imaging, spectral Doppler, color Doppler, and power Doppler as needed of all accessible portions of each vessel. Bilateral testing is considered an integral part of a complete examination. Limited examinations for reoccurring indications may be performed as noted.  Right Carotid Findings: +----------+--------+--------+--------+------------------+--------+           PSV cm/sEDV cm/sStenosisPlaque DescriptionComments +----------+--------+--------+--------+------------------+--------+ CCA Prox  142     39                                         +----------+--------+--------+--------+------------------+--------+ CCA Distal88      34      <50%    heterogenous               +----------+--------+--------+--------+------------------+--------+  ICA Prox  112     42      1-39%   heterogenous               +----------+--------+--------+--------+------------------+--------+ ICA Mid   125     45                                         +----------+--------+--------+--------+------------------+--------+ ICA Distal96      51                                         +----------+--------+--------+--------+------------------+--------+ ECA       161     18                                         +----------+--------+--------+--------+------------------+--------+ +----------+--------+-------+----------------+-------------------+           PSV cm/sEDV cmsDescribe        Arm Pressure (mmHG) +----------+--------+-------+----------------+-------------------+ [id number]             Multiphasic, [id number]                 +----------+--------+-------+----------------+-------------------+ +---------+--------+--+--------+--+---------+ VertebralPSV cm/s59EDV cm/s21Antegrade +---------+--------+--+--------+--+---------+  Left Carotid Findings: +----------+--------+--------+--------+------------------+--------+            PSV cm/sEDV cm/sStenosisPlaque DescriptionComments +----------+--------+--------+--------+------------------+--------+ CCA Prox  127     12                                         +----------+--------+--------+--------+------------------+--------+ CCA Distal43      9                                          +----------+--------+--------+--------+------------------+--------+ ICA Prox                  Occludedheterogenous               +----------+--------+--------+--------+------------------+--------+ ICA Mid                   Occluded                           +----------+--------+--------+--------+------------------+--------+ ICA Distal                Occluded                           +----------+--------+--------+--------+------------------+--------+ ECA       151     23              heterogenous               +----------+--------+--------+--------+------------------+--------+ +----------+--------+--------+----------------+-------------------+           PSV cm/sEDV cm/sDescribe        Arm Pressure (mmHG) +----------+--------+--------+----------------+-------------------+ [id number]             Multiphasic, [id number]                 +----------+--------+--------+----------------+-------------------+ +---------+--------+--+--------+--+---------+ VertebralPSV cm/s60EDV  cm/s19Antegrade +---------+--------+--+--------+--+---------+   Summary: Right Carotid: Velocities in the right ICA are consistent with a 1-39% stenosis.                Non-hemodynamically significant plaque <50% noted in the CCA. Left Carotid: Evidence consistent with a total occlusion of the left ICA.               Non-hemodynamically significant plaque <50% noted in the CCA. Vertebrals:  Bilateral vertebral arteries demonstrate antegrade flow. Subclavians: Normal flow hemodynamics were seen in bilateral subclavian              arteries. *See table(s) above for measurements and  observations. Suggest follow up study in 12 months. Electronically signed by Kathlyn Sacramento MD on 06/11/2020 at 5:03:56 PM.    Final    Assessment/Plan The patient is a 80 year old male who presents today for a scheduled abdominal aortic aneurysm repair  1.  Abdominal aortic aneurysm without rupture: The aneurysm is > 5 cm and therefore should undergo repair. Patient is status post CT scan of the abdominal aorta. The patient is a candidate for endovascular repair.   The patient has received cardiac clearance.  The patient will continue antiplatelet therapy as prescribed (since the patient is undergoing endovascular repair as opposed to open repair) as well as aggressive management of hyperlipidemia. Exercise is again strongly encouraged.   The patient is reminded that lifetime routine surveillance is a necessity with an endograft.   The risks and benefits of AAA repair are reviewed with the patient.  All questions are answered.  Alternative therapies are also discussed.  The patient agrees to proceed with endovascular aneurysm repair.  2.  Hyperlipidemia: On aspirin and statin for medical management Encouraged good control as its slows the progression of atherosclerotic disease  3.  Possible nicotine dependence: We had a discussion for approximately three minutes regarding the absolute need for smoking cessation due to the deleterious nature of tobacco on the vascular system. We discussed the tobacco use would diminish patency of any intervention, and likely significantly worsen progressio of disease. We discussed multiple agents for quitting including replacement therapy or medications to reduce cravings such as Chantix. The patient voices their understanding of the importance of smoking cessation.  Discussed with Dr. Francene Castle, PA-C  07/03/2020 1:02 PM

## 2020-07-03 NOTE — Anesthesia Postprocedure Evaluation (Deleted)
Anesthesia Post Note  Patient: Garrett Moore  Procedure(s) Performed: ENDOVASCULAR REPAIR/STENT GRAFT (N/A )  Anesthesia Type: General Anesthetic complications: no   No complications documented.   Last Vitals:  Vitals:   07/03/20 1118  BP: 140/73  Pulse: 75  Resp: 19  Temp: 36.5 C  SpO2: 96%    Last Pain:  Vitals:   07/03/20 1118  PainSc: 0-No pain                 Tawana Fletcher-Harrison

## 2020-07-03 NOTE — Op Note (Signed)
OPERATIVE NOTE     PROCEDURE: 1. US guidance for vascular access, bilateral femoral arteries 2. Catheter placement into aorta from bilateral femoral approaches 3. Catheter placement into tertiary branches of the right internal iliac artery from the left femoral approach 4. Placement of a conformable C3 Gore Excluder Endoprosthesis main body 28 x 14 x 12 with a 27 x 12 contralateral limb 5. Placement of a C3 Gore Excluder bifurcated iliac segment 23 x 12 x 10 which is deployed into the right external iliac artery 6. Placement of a 16 x 12 x 7 stent into the right hypogastric artery 7. Placement of a 12 x 14 and then a 12 x 10 extender limb left external iliac artery 8. 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  AAA with bilateral common iliac artery aneurysm   SPECIMEN(S):  none   INDICATIONS:   Garrett Moore is a 80 y.o. y.o. male who presents with an abdominal aortic aneurysm that is now greater than 5 cm and is associated with bilateral common iliac artery aneurysms. The patient will require endovascular repair to prevent lethal rupture.   DESCRIPTION: After obtaining full informed written consent, the patient was brought back to the operating room and placed supine upon the operating table.  The patient received IV antibiotics prior to induction.  After obtaining adequate anesthesia, the patient was prepped and draped in the standard fashion for endovascular AAA repair.  Co-surgeons are required because this is a complex bilateral procedure with work being performed simultaneously from both the right femoral and left femoral approach.  This also  expedites the procedure making a shorter operative time reducing complications and improving patient safety.  We then began by gaining access to both femoral arteries with US guidance with me working on the patient's right and Dr. Lucky Cowboy working on the patient's left.  The femoral arteries were found to be patent and accessed without difficulty with a needle under ultrasound guidance without difficulty on each side and permanent images were recorded.  We then placed 2 proglide devices on each side in a pre-close fashion and placed 8 French sheaths.   The patient was then given  6000 units of intravenous heparin an additional 2000 units was given later in the case.    The Pigtail catheter was placed into the aorta from the   left side. Image was then obtained demonstrating the right iliac bifurcation. Stiff Amplatz wire was then advanced up both the right and left sides. 22 French sheath was then advanced up the right side and the 8 [place] sheath was exchanged for a 12 French sheath up the left side. Subsequently a Glidewire was advanced up the left side and snared by Dr. Delana Meyer from the right side and pulled extracorporeally. Using this wire which now is traveling from the left groin over the aortic bifurcation to the right groin the 12 French sheath is advanced and positioned just proximal to the right iliac bifurcation. The 23 x 12 x 10 iliac bifurcation device was then prepped on the back table and advanced through the 16 French sheath on the right and positioned with the gait just above the right iliac  bifurcation. The proximal portion was then opened and the left groin sheath was advanced into the main portion of the iliac bifurcation device. Dr. Lucky Cowboy then used a KMP catheter and a Glidewire the wire catheter combination was negotiate the right internal iliac artery. Catheter was then introduced down into the tertiary branches hand injection contrast was used to localize the first major bifurcation as well  as the distal anatomy.  This represents 3rd order catheter placement. Subsequently, an Amplatz Super Stiff wire was advanced through the catheter and the catheter removed. Next, the 16 x 12 x 7 extender limb was deployed bridging the iliac bifurcation device with the right internal iliac device.   8 mm x 60 mm Ultraverse angioplasty balloon was then advanced up left sheath and used to angioplasty the distal portion of the internal iliac artery stent on the right.  Inflation was to 10 atm.  Next a 14 mm x 40 Ultraverse balloon was advanced up the left side and positioned in the gate of the bifurcated iliac artery device and a 10 mm x 40 mm balloon was advanced up the right side and positioned opposite the 40 mm balloon.  They were simultaneously inflated using a 10 mm balloon in the right external iliac and a 14 mm balloon in the right internal iliac. Once this had been achieved the crossing Glidewire  was removed the left sided sheath was pulled back into the left common iliac and an stiff angle Glidewire was advanced under fluoroscopic guidance into the descending thoracic aorta. The 12 French sheath on the left was then exchanged for a 18 [place] sheath.    Pigtail catheter was then reintroduced and angiography of the abdominal aorta was obtained localized in the renal arteries. Using this image, we selected a 28 x 14 x 12 conformable Main body device. The main body was then advanced over a stiff wire. A Pigtail catheter was placed up the right side and a magnified image at the renal arteries was performed. The main body was then deployed just below the lowest renal artery. The Kumpe catheter was used to cannulate the contralateral gate without difficulty and successful cannulation was confirmed by twirling the pigtail catheter in the main body.  A 27 x 12 limb was selected and advance through the contralateral gate of the main body.  The limb was deployed bridging the main body to the bifurcated iliac device.  The main body deployment was then completed. Based off the angiographic findings, extension limbs were necessary.  A 12 x 14 and then a 12 x 10 extender limb was then advanced up the left side and deployed extending the left limb down into the mid external iliac artery. All remaining junction points and seals zones were treated with the compliant balloon.    The pigtail catheter was then replaced and a completion angiogram was performed.   No Endoleak was detected on completion angiography. The renal arteries were found to be widely patent.    At this point we elected to terminate the procedure. We secured the pro glide devices for hemostasis on the femoral arteries. The skin incision was closed with a 4-0 Monocryl. Dermabond and pressure dressing were placed. The patient was taken to the recovery room in stable condition having tolerated the procedure well.   COMPLICATIONS: none   CONDITION: stable   [name] [facility] Office: [phone]   07/03/2020, 4:52 PM

## 2020-07-03 NOTE — Transfer of Care (Signed)
Immediate Anesthesia Transfer of Care Note  Patient: Garrett Moore  Procedure(s) Performed: ENDOVASCULAR REPAIR/STENT GRAFT (N/A )  Patient Location: PACU  Anesthesia Type:General  Level of Consciousness: drowsy  Airway & Oxygen Therapy: Patient Spontanous Breathing and Patient connected to face mask oxygen  Post-op Assessment: Report given to RN and Post -op Vital signs reviewed and stable  Post vital signs: Reviewed and stable  Last Vitals:  Vitals Value Taken Time  BP 128/68 07/03/20 1701  Temp    Pulse 63 07/03/20 1705  Resp 16 07/03/20 1705  SpO2 100 % 07/03/20 1705  Vitals shown include unvalidated device data.  Last Pain:  Vitals:   07/03/20 1118  PainSc: 0-No pain         Complications: No complications documented.

## 2020-07-03 NOTE — Anesthesia Preprocedure Evaluation (Addendum)
Anesthesia Evaluation  Patient identified by MRN, date of birth, ID band Patient awake    Reviewed: Allergy & Precautions, H&P , NPO status , Patient's Chart, lab work & pertinent test results  History of Anesthesia Complications Negative for: history of anesthetic complications  Airway Mallampati: II  TM Distance: >3 FB     Dental  (+) Edentulous Lower, Edentulous Upper   Pulmonary neg sleep apnea, COPD, former smoker,    breath sounds clear to auscultation       Cardiovascular (-) angina+ CAD and + Past MI  (-) Cardiac Stents (-) dysrhythmias + Valvular Problems/Murmurs AS  Rhythm:regular Rate:Normal  AAA unruptured HFpEF, Grade 1 diastolic dysfunction Moderate AS   Neuro/Psych negative neurological ROS  negative psych ROS   GI/Hepatic negative GI ROS, Neg liver ROS,   Endo/Other  negative endocrine ROS  Renal/GU Renal disease (CKD)     Musculoskeletal   Abdominal   Peds  Hematology negative hematology ROS (+)   Anesthesia Other Findings Past Medical History: 05/10/2020: Aneurysm of infrarenal abdominal aorta (HCC)     Comment:  5.9 cm No date: Aortic atherosclerosis ([place]) 02/08/2018: Aortic valvar stenosis     Comment:  Mild to Moderate Noted on ECHO No date: Basal cell carcinoma     Comment:  Face No date: Carotid artery stenosis No date: CKD (chronic kidney disease), stage III (HCC) No date: COPD (chronic obstructive pulmonary disease) (HCC) No date: Coronary artery disease No date: DVT (deep venous thrombosis) (HCC)     Comment:  age 19, leg after Tonsillectomy No date: Family history of adverse reaction to anesthesia     Comment:  mother under anesthesia for extended period for AAA               memory problems/dementia after 73/7106: Grade I diastolic dysfunction No date: Heart murmur No date: Hyperlipidemia 07/1983: Myocardial infarction ([place]) No date: OA (osteoarthritis)  Past Surgical  History: 08/14/1983: CARDIAC CATHETERIZATION 01/23/2011: CARDIOVASCULAR STRESS TEST     Comment:  normal myocardial perfusion scan demonstrating an               attenuation artifact in the inferior region of the               myocardium. no ischemia or infarct/scar seen in remaining              myocardium 05/21/2006: CAROTID DUPLEX     Comment:  right bulb-0-49% diameter reduction; left bulb and               proximal ICA-0-49% diameter reduction 01/2019: CATARACT EXTRACTION, BILATERAL 07/1983: CORONARY ANGIOPLASTY     Comment:  PTCA Prox RCA 07/21/2012: DOPPLER ECHOCARDIOGRAPHY     Comment:  EF 50-55%, aortic valve noncoronary cusp mobility was               severely restricted. No date: ROTATOR CUFF REPAIR; Left 07/1983: TEMPORARY PACEMAKER No date: TONSILLECTOMY 02/21/2019: TOTAL HIP ARTHROPLASTY; Left     Comment:  Procedure: TOTAL HIP ARTHROPLASTY ANTERIOR APPROACH;                Surgeon: Paralee Cancel, MD;  Location: [facility];  Service:               Orthopedics;  Laterality: Left;  70 mins     Reproductive/Obstetrics negative OB ROS  Anesthesia Physical Anesthesia Plan  ASA: III  Anesthesia Plan: General ETT   Post-op Pain Management:    Induction:   PONV Risk Score and Plan: Ondansetron, Dexamethasone and Treatment may vary due to age or medical condition  Airway Management Planned:   Additional Equipment:   Intra-op Plan:   Post-operative Plan:   Informed Consent: I have reviewed the patients History and Physical, chart, labs and discussed the procedure including the risks, benefits and alternatives for the proposed anesthesia with the patient or authorized representative who has indicated his/her understanding and acceptance.     Dental Advisory Given  Plan Discussed with: Anesthesiologist, CRNA and Surgeon  Anesthesia Plan Comments:         Anesthesia Quick Evaluation

## 2020-07-03 NOTE — Interval H&P Note (Signed)
History and Physical Interval Note:  07/03/2020 2:06 PM  Garrett Moore  has presented today for surgery, with the diagnosis of AAA Stent Repair  Anesthesia  GORE   AAA repair  [place]  April 4 [name] AVV spoke with [name] Anesthesia     cc:  [name].  The various methods of treatment have been discussed with the patient and family. After consideration of risks, benefits and other options for treatment, the patient has consented to  Procedure(s): ENDOVASCULAR REPAIR/STENT GRAFT (N/A) as a surgical intervention.  The patient's history has been reviewed, patient examined, no change in status, stable for surgery.  I have reviewed the patient's chart and labs.  Questions were answered to the patient's satisfaction.     [name]

## 2020-07-03 NOTE — Plan of Care (Signed)
Patient admitted to ICU 2 from Special Procedures. Report received from [facility] RN by this RN.  Problem: Education: Goal: Knowledge of General Education information will improve Description: Including pain rating scale, medication(s)/side effects and non-pharmacologic comfort measures Outcome: Progressing   Problem: Health Behavior/Discharge Planning: Goal: Ability to manage health-related needs will improve Outcome: Progressing   Problem: Clinical Measurements: Goal: Ability to maintain clinical measurements within normal limits will improve Outcome: Progressing Goal: Will remain free from infection Outcome: Progressing Goal: Diagnostic test results will improve Outcome: Progressing Goal: Respiratory complications will improve Outcome: Progressing Goal: Cardiovascular complication will be avoided Outcome: Progressing   Problem: Activity: Goal: Risk for activity intolerance will decrease Outcome: Progressing   Problem: Nutrition: Goal: Adequate nutrition will be maintained Outcome: Progressing   Problem: Coping: Goal: Level of anxiety will decrease Outcome: Progressing   Problem: Elimination: Goal: Will not experience complications related to bowel motility Outcome: Progressing Goal: Will not experience complications related to urinary retention Outcome: Progressing   Problem: Pain Managment: Goal: General experience of comfort will improve Outcome: Progressing   Problem: Safety: Goal: Ability to remain free from injury will improve Outcome: Progressing   Problem: Skin Integrity: Goal: Risk for impaired skin integrity will decrease Outcome: Progressing

## 2020-07-03 NOTE — Telephone Encounter (Signed)
   Patient Name: Garrett Moore  DOB: 2040/11/23 MRN [id number]   Primary Cardiologist: Ida Rogue, MD  Chart reviewed as part of pre-operative protocol coverage.   Per Dr. [name]: Acceptable risk for procedure Echo with normal EF, moderate aortic valve stenosis No further cardiac workup needed Would stay on asa if possible  Will route this bundled recommendation to requesting provider via Epic fax function and remove from pre-op pool. Please call with questions.  Tami Lin Genifer Lazenby, PA 07/03/2020, 7:52 AM

## 2020-07-03 NOTE — Progress Notes (Signed)
Report given to Candace, RN

## 2020-07-03 NOTE — Anesthesia Procedure Notes (Signed)
Procedure Name: Intubation Date/Time: 07/03/2020 1:55 PM Performed by: Lia Foyer, CRNA Pre-anesthesia Checklist: Patient identified, Emergency Drugs available, Suction available and Patient being monitored Patient Re-evaluated:Patient Re-evaluated prior to induction Oxygen Delivery Method: Circle system utilized Preoxygenation: Pre-oxygenation with 100% oxygen Induction Type: IV induction Ventilation: Mask ventilation without difficulty Laryngoscope Size: McGraph and 4 Grade View: Grade I Tube type: Oral Tube size: 7.0 mm Number of attempts: 1 Airway Equipment and Method: Stylet and Video-laryngoscopy Placement Confirmation: ETT inserted through vocal cords under direct vision,  positive ETCO2 and breath sounds checked- equal and bilateral Secured at: 20 cm Tube secured with: Tape Dental Injury: Teeth and Oropharynx as per pre-operative assessment

## 2020-07-03 NOTE — Op Note (Signed)
OPERATIVE NOTE     PROCEDURE: 1. US guidance for vascular access, bilateral femoral arteries 2. Catheter placement into aorta from bilateral femoral approaches 3. Catheter placement into tertiary branches of the right internal iliac artery from the left femoral approach 4. Placement of a C3 Gore Excluder Endoprosthesis main body right 28 mm diameter by 12 cm length conformable device with a 12 mm diameter by 14 cm length left contralateral limb 5. Placement of a C3 Gore Excluder bifurcated iliac segment 23 mm proximal 10 cm length which is deployed into the right external iliac artery 6. Placement of a 12 mm x 7 cm stent into the right hypogastric artery 7. Placement of a 12 mm diameter by 10 cm length extender limb left external iliac artery to exclude the aneurysm that went to the termination of the left common iliac artery with an occluded left hypogastric artery 8. Lifestream stent placement right external iliac artery with 12 mm diameter by 38 mm length lifestream stent 9. Pro glide closures utilized in a pre-close fashion bilateral common femoral arteries   PRE-OPERATIVE DIAGNOSIS: AAA, iliac artery aneurysms   POST-OPERATIVE DIAGNOSIS: same   SURGEON: [name], MD and Leotis Pain, MD - Co-surgeons   ANESTHESIA: general   ESTIMATED BLOOD LOSS: 50 cc   FINDING(S): 1.  AAA with bilateral common iliac artery aneurysm   SPECIMEN(S):  none   INDICATIONS:   Garrett Moore is a 80 y.o. y.o. male who presents with an abdominal aortic aneurysm that is now greater than 5 cm and is associated with bilateral common iliac artery aneurysms. The patient will require endovascular repair to prevent lethal rupture. Risks and benefits are discussed and the patient is agreeable to proceed. Co-surgeons are used to expedite the procedure and reduce operative time as bilateral work needs to be done.   DESCRIPTION: After obtaining full informed written consent, the patient was brought back to  the operating room and placed supine upon the operating table.  The patient received IV antibiotics prior to induction.  After obtaining adequate anesthesia, the patient was prepped and draped in the standard fashion for endovascular AAA repair.  We then began by gaining access to both femoral arteries with US guidance with me working on the left and Dr. Delana Meyer working on the right.  The femoral arteries were found to be patent and accessed without difficulty with a needle under ultrasound guidance without difficulty on each side and permanent images were recorded.  We then placed 2 proglide devices on each side in a pre-close fashion and placed 8 French sheaths.   The patient was then given 6000 units of intravenous heparin.    The Pigtail catheter was placed into the aorta from the left side. Image was then obtained demonstrating the right iliac bifurcation. Stiff Amplatz wire was then advanced up both the right and left sides. 62 French sheath was then advanced up the right side and the 8 [place] sheath was exchanged for a 12 French sheath up the left side. Subsequently a Glidewire was advanced up the left side and snared by Dr. Delana Meyer from the right side and pulled extracorporeally. Using this wire which now is traveling from the left groin over the aortic bifurcation to the right groin the 12 French sheath is advanced and positioned just proximal to the right iliac bifurcation. The 23 mm proximal by 10 cm length iliac bifurcation device was then prepped on the back table and advanced through the 16 French sheath and positioned above  the right iliac bifurcation. The proximal portion was then opened and the left groin sheath was advanced into the main portion of the iliac bifurcation device. Dr. Lucky Cowboy then used a KMP catheter and a Glidewire the wire catheter combination was negotiate the tertiary branches of the right internal iliac artery. Catheter was then introduced down into the tertiary branches hand  injection contrast was used to localize the first major bifurcation as well as the distal anatomy.  This represents 3rd order catheter placement. Subsequently, an Amplatz Super Stiff wire was advanced through the catheter and the catheter removed. Next, the 12 mm diameter by 7 cm length iliac limb was deployed bridging the iliac bifurcation device with the right internal iliac device going out the right internal iliac artery to just before the primary branches. Angioplasty balloon was then advanced up both the right and left side and simultaneously inflated using a 10 mm balloon in the right external iliac and a 14 mm balloon in the right internal iliac. Once this had been achieved the crossing Glidewire  was removed the left sided sheath was pulled back into the left common iliac and an stiff angle Glidewire was advanced under fluoroscopic guidance into the descending thoracic aorta. The 12 French sheath on the left was then exchanged for a 18 [place] sheath.    Pigtail catheter was then reintroduced and angiography of the abdominal aorta was obtained localized in the renal arteries. Using this image, we selected a 28 mm diameter conformable Gore Excluder Main body device. The main body was then advanced over a stiff wire. A Pigtail catheter was placed up the right side and a magnified image at the renal arteries was performed. The main body was then deployed just below the lowest renal artery which was the left. The Kumpe catheter was used to cannulate the contralateral gate without difficulty and successful cannulation was confirmed by twirling the pigtail catheter in the main body. We then placed a stiff wire and a retrograde arteriogram was performed through the right femoral sheath.   A 23 mm diameter by 12 cm length right iliac limb was selected and advance through the contralateral gate of the main body.  The limb was deployed bridging the main body to the bifurcated iliac device. The main body deployment  was then completed. Based off the angiographic findings, extension limbs were necessary.  A 12 mm diameter by 14 cm length left iliac extender limb was then advanced up the left side and deployed.  This did not successfully exclude the entire aneurysm and was barely into the left external iliac artery and the aneurysm traversed all the way down to the left iliac bifurcation.  An additional 12 mm diameter by 10 cm length left iliac extension limb was taken down to the mid left external iliac artery.  All remaining junction points and seals zones were treated with the compliant balloon.  Both external iliac arteries were treated with 8 mm diameter noncompliant balloons.  There remained some constraint at the origin of the right external iliac artery and an additional 12 mm diameter by 38 mm length lifestream stent was deployed and inflated to 12 atm.  This resolved the waist and there is no significant residual stenosis.   The pigtail catheter was then replaced and a completion angiogram was performed.   No Endoleak was detected on completion angiography. The renal arteries were found to be widely patent.  The right hypogastric artery stent and branches beyond the stent were all widely  patent.  The left hypogastric artery was known to be occluded and the stents down to the left external iliac artery were widely patent.   At this point we elected to terminate the procedure. We secured the pro glide devices for hemostasis on the femoral arteries. The skin incision was closed with a 4-0 Monocryl. Dermabond and pressure dressing were placed. The patient was taken to the recovery room in stable condition having tolerated the procedure well.   COMPLICATIONS: none   CONDITION: stable   Leotis Pain [facility] Office: [phone]   07/03/2020, 4:47 PM

## 2020-07-04 ENCOUNTER — Telehealth: Payer: Self-pay | Admitting: Urology

## 2020-07-04 ENCOUNTER — Encounter: Payer: Self-pay | Admitting: Vascular Surgery

## 2020-07-04 DIAGNOSIS — I714 Abdominal aortic aneurysm, without rupture: Secondary | ICD-10-CM | POA: Diagnosis not present

## 2020-07-04 LAB — CBC
HCT: 34.9 % — ABNORMAL LOW (ref 39.0–52.0)
Hemoglobin: 11.7 g/dL — ABNORMAL LOW (ref 13.0–17.0)
MCH: 31.9 pg (ref 26.0–34.0)
MCHC: 33.5 g/dL (ref 30.0–36.0)
MCV: 95.1 fL (ref 80.0–100.0)
Platelets: 234 10*3/uL (ref 150–400)
RBC: 3.67 MIL/uL — ABNORMAL LOW (ref 4.22–5.81)
RDW: 12.9 % (ref 11.5–15.5)
WBC: 16.8 10*3/uL — ABNORMAL HIGH (ref 4.0–10.5)
nRBC: 0 % (ref 0.0–0.2)

## 2020-07-04 LAB — BASIC METABOLIC PANEL
Anion gap: 8 (ref 5–15)
BUN: 15 mg/dL (ref 8–23)
CO2: 22 mmol/L (ref 22–32)
Calcium: 8.5 mg/dL — ABNORMAL LOW (ref 8.9–10.3)
Chloride: 107 mmol/L (ref 98–111)
Creatinine, Ser: 1.28 mg/dL — ABNORMAL HIGH (ref 0.61–1.24)
GFR, Estimated: 57 mL/min — ABNORMAL LOW (ref >60)
Glucose, Bld: 148 mg/dL — ABNORMAL HIGH (ref 70–99)
Potassium: 4.3 mmol/L (ref 3.5–5.1)
Sodium: 137 mmol/L (ref 135–145)

## 2020-07-04 MED ORDER — CHLORHEXIDINE GLUCONATE CLOTH 2 % EX PADS: 6.0000 | MEDICATED_PAD | Freq: Every day | CUTANEOUS | Status: DC

## 2020-07-04 MED ORDER — OXYCODONE-ACETAMINOPHEN 5-325 MG PO TABS: 1.0000 | ORAL_TABLET | Freq: Four times a day (QID) | ORAL | Status: DC | PRN

## 2020-07-04 MED ORDER — CLOPIDOGREL BISULFATE 75 MG PO TABS: 75.0000 mg | ORAL_TABLET | Freq: Every day | ORAL | 3 refills | Status: DC

## 2020-07-04 NOTE — Discharge Summary (Signed)
[facility]    Discharge Summary  Patient ID:  Garrett Moore MRN: [id number] DOB/AGE: 1940/07/30 80 y.o.  Admit date: 07/03/2020 Discharge date: 07/04/2020 Date of Surgery: 07/03/2020 Surgeon: Surgeon(s): Schnier, Dolores Lory, MD Algernon Huxley, MD  Admission Diagnosis: AAA (abdominal aortic aneurysm) [facility]) [I71.4]  Discharge Diagnoses:  AAA (abdominal aortic aneurysm) ([place]) [I71.4]  Secondary Diagnoses: Past Medical History:  Diagnosis Date  . Aneurysm of infrarenal abdominal aorta (HCC) 05/10/2020   5.9 cm  . Aortic atherosclerosis ([place])   . Aortic valvar stenosis 02/08/2018   Mild to Moderate Noted on ECHO  . Basal cell carcinoma    Face  . Carotid artery stenosis   . CKD (chronic kidney disease), stage III ([place])   . COPD (chronic obstructive pulmonary disease) ([place])   . Coronary artery disease   . DVT (deep venous thrombosis) ([place])    age 4, leg after Tonsillectomy  . Family history of adverse reaction to anesthesia    mother under anesthesia for extended period for AAA memory problems/dementia after  . Grade I diastolic dysfunction [id number]/[id number]  . Heart murmur   . Hyperlipidemia   . Myocardial infarction ([place]) 07/1983  . OA (osteoarthritis)    Procedure(s): 07/03/20: 1. US guidance for vascular access, bilateral femoral arteries 2. Catheter placement into aorta from bilateral femoral approaches 3. Catheter placement into tertiary branches of the right internal iliac artery from the left femoral approach 4. Placement of a C3Gore Excluder Endoprosthesis main body right 28 mm diameter by 12 cm length conformable devicewith a 12 mm diameter by 14 cm length left contralateral limb 5. Placement of a C3 Gore Excluder bifurcated iliac segment 23 mm proximal 10 cm length which is deployed into the right external iliac artery 6. Placement of a 12 mm x 7 cm stent into the right hypogastric artery 7. Placement of a 12 mm diameter by 10 cm length extender  limb left external iliac artery to exclude the aneurysm that went to the termination of the left common iliac artery with an occluded left hypogastric artery 8. Lifestream stent placement right external iliac artery with 12 mm diameter by 38 mm length lifestream stent 9. Pro glide closures utilized in a pre-close fashion bilateral common femoral arteries  Discharged Condition: Good  HPI / Hospital Course:  Garrett Moore is a 80 y.o.y.o. malewho presents with an abdominal aortic aneurysm that is now greater than 5 cm and is associated with bilateral common iliac artery aneurysms. The patient will require endovascular repair to prevent lethal rupture. Risks and benefits are discussed and the patient is agreeable to proceed. On 07/03/20 the patient underwent:  10. US guidance for vascular access, bilateral femoral arteries 11. Catheter placement into aorta from bilateral femoral approaches 12. Catheter placement into tertiary branches of the right internal iliac artery from the left femoral approach 13. Placement of a C3Gore Excluder Endoprosthesis main body right 28 mm diameter by 12 cm length conformable devicewith a 12 mm diameter by 14 cm length left contralateral limb 14. Placement of a C3 Gore Excluder bifurcated iliac segment 23 mm proximal 10 cm length which is deployed into the right external iliac artery 15. Placement of a 12 mm x 7 cm stent into the right hypogastric artery 16. Placement of a 12 mm diameter by 10 cm length extender limb left external iliac artery to exclude the aneurysm that went to the termination of the left common iliac artery with an occluded left hypogastric artery  17. Lifestream stent placement right external iliac artery with 12 mm diameter by 38 mm length lifestream stent 18. Pro glide closures utilized in a pre-close fashion bilateral common femoral arteries  He tolerated the procedure well was transferred from the angiography suite to the ICU for observation  overnight.  Patient's night of surgery was unremarkable.  During his brief stay, his diet was advanced, his Foley was removed, his pain was controlled to the use of p.o. pain medication and he was ambulating at baseline.  Day of discharge, the patient was afebrile with stable vital signs and essentially unremarkable physical exam.  Physical exam:  Alert and oriented x3, no acute distress Cardiovascular: Regular rate and rhythm Pulmonary: Clear to auscultation bilaterally Abdomen: Soft, nondistended nontender Left groin access site: Clean dry and intact.  Minimal ecchymosis or swelling no drainage. Right groin access site: Clean dry and intact.  Minimal ecchymosis or swelling no drainage. Extremity: Warm distally toes  Labs: As below  Complications: None  Consults: None  Significant Diagnostic Studies: CBC Lab Results  Component Value Date   WBC 16.8 (H) 07/04/2020   HGB 11.7 (L) 07/04/2020   HCT 34.9 (L) 07/04/2020   MCV 95.1 07/04/2020   PLT 234 07/04/2020   BMET    Component Value Date/Time   NA 137 07/04/2020 0331   K 4.3 07/04/2020 0331   CL 107 07/04/2020 0331   CO2 22 07/04/2020 0331   GLUCOSE 148 (H) 07/04/2020 0331   BUN 15 07/04/2020 0331   CREATININE 1.28 (H) 07/04/2020 0331   CALCIUM 8.5 (L) 07/04/2020 0331   GFRNONAA 57 (L) 07/04/2020 0331   GFRAA >60 02/22/2019 0236   COAG Lab Results  Component Value Date   INR 1.1 06/20/2020   Disposition:  Discharge to :Home  Allergies as of 07/04/2020   No Known Allergies     Medication List    TAKE these medications   aspirin EC 81 MG tablet Take 81 mg by mouth daily. Swallow whole.   clopidogrel 75 MG tablet Commonly known as: PLAVIX Take 1 tablet (75 mg total) by mouth daily at 6 (six) AM. Start taking on: July 05, 2020   multivitamin with minerals Tabs tablet Take 1 tablet by mouth daily.   nicotine polacrilex 4 MG lozenge Commonly known as: COMMIT Take 4 mg by mouth as needed for smoking  cessation.   simvastatin 80 MG tablet Commonly known as: ZOCOR Take 80 mg by mouth daily at 6 PM.      Verbal and written Discharge instructions given to the patient. Wound care per Discharge AVS  Follow-up Information    Schnier, Dolores Lory, MD Follow up in 1 month(s).   Specialties: Vascular Surgery, Cardiology, Radiology, Vascular Surgery Why: Can see the Schnier or Arna Medici.  Will need EVAR with visit.  First postoperative visit. Contact information: [place] [address] [phone]              Signed: Sela Hua, PA-C  07/04/2020, 10:29 AM

## 2020-07-04 NOTE — Telephone Encounter (Signed)
Spoke with Pt. And he stated that he had Triple A Surgery on 07/03/20.  He wants you to look at all the appt. Notes from othe Dr's that he has seen lately and give him a call about his appt. For Tues 07/09/20.

## 2020-07-04 NOTE — Telephone Encounter (Signed)
Left patient a VM asked to return call-follow up appointment will discuss further.

## 2020-07-04 NOTE — Plan of Care (Signed)
Pt discharged home with son, VSS, discharged instructions given to pt and he stated understanding. AVS printed and given to pt, pt denies any questions. Pt denies any pain.

## 2020-07-09 ENCOUNTER — Telehealth (INDEPENDENT_AMBULATORY_CARE_PROVIDER_SITE_OTHER): Payer: Self-pay

## 2020-07-09 ENCOUNTER — Other Ambulatory Visit: Payer: Self-pay

## 2020-07-09 ENCOUNTER — Telehealth: Payer: Self-pay | Admitting: Cardiovascular Disease

## 2020-07-09 ENCOUNTER — Encounter: Payer: Self-pay | Admitting: Urology

## 2020-07-09 ENCOUNTER — Ambulatory Visit: Payer: Medicare HMO | Admitting: Urology

## 2020-07-09 VITALS — BP 158/68 | HR 83 | Temp 97.4°F | Wt 192.0 lb

## 2020-07-09 DIAGNOSIS — R3129 Other microscopic hematuria: Secondary | ICD-10-CM

## 2020-07-09 DIAGNOSIS — N201 Calculus of ureter: Secondary | ICD-10-CM | POA: Diagnosis not present

## 2020-07-09 DIAGNOSIS — N138 Other obstructive and reflux uropathy: Secondary | ICD-10-CM

## 2020-07-09 DIAGNOSIS — N401 Enlarged prostate with lower urinary tract symptoms: Secondary | ICD-10-CM

## 2020-07-09 LAB — BLADDER SCAN AMB NON-IMAGING: Scan Result: 45

## 2020-07-09 NOTE — Patient Instructions (Signed)
Ureteroscopy Ureteroscopy is a procedure to check for and treat problems inside part of the urinary tract. In this procedure, a thin, flexible tube with a light at the end (ureteroscope) is used to look at the inside of the kidneys and the ureters. The ureters are the tubes that carry urine from the kidneys to the bladder. The ureteroscope is inserted into one or both of the ureters. You may need this procedure if you have frequent urinary tract infections (UTIs), blood in your urine, or a stone in one of your ureters. A ureteroscopy can be done:  To find the cause of urine blockage in a ureter and to evaluate other abnormalities inside the ureters or kidneys.  To remove stones.  To remove or treat growths of tissue (polyps), abnormal tissue, and some types of tumors.  To remove a tissue sample and check it for disease under a microscope (biopsy). Tell a health care provider about:  Any allergies you have.  All medicines you are taking, including vitamins, herbs, eye drops, creams, and over-the-counter medicines.  Any problems you or family members have had with anesthetic medicines.  Any blood disorders you have.  Any surgeries you have had.  Any medical conditions you have.  Whether you are pregnant or may be pregnant. What are the risks? Generally, this is a safe procedure. However, problems may occur, including:  Bleeding.  Infection.  Allergic reactions to medicines.  Scarring that narrows the ureter (stricture).  Creating a hole in the ureter (perforation). What happens before the procedure? Staying hydrated Follow instructions from your health care provider about hydration, which may include:  Up to 2 hours before the procedure - you may continue to drink clear liquids, such as water, clear fruit juice, black coffee, and plain tea.   Eating and drinking restrictions Follow instructions from your health care provider about eating and drinking, which may include:  8  hours before the procedure - stop eating heavy meals or foods, such as meat, fried foods, or fatty foods.  6 hours before the procedure - stop eating light meals or foods, such as toast or cereal.  6 hours before the procedure - stop drinking milk or drinks that contain milk.  2 hours before the procedure - stop drinking clear liquids. Medicines Ask your health care provider about:  Changing or stopping your regular medicines. This is especially important if you are taking diabetes medicines or blood thinners.  Taking medicines such as aspirin and ibuprofen. These medicines can thin your blood. Do not take these medicines unless your health care provider tells you to take them.  Taking over-the-counter medicines, vitamins, herbs, and supplements. General instructions  Do not use any products that contain nicotine or tobacco for at least 4 weeks before the procedure. These products include cigarettes, e-cigarettes, and chewing tobacco. If you need help quitting, ask your health care provider.  You may have a urine sample taken to check for infection.  Plan to have someone take you home from the hospital or clinic.  If you will be going home right after the procedure, plan to have someone with you for 24 hours.  Ask your health care provider what steps will be taken to help prevent infection. These may include: ? Washing skin with a germ-killing soap. ? Receiving antibiotic medicine. What happens during the procedure?  An IV will be inserted into one of your veins.  You will be given one or more of the following: ? A medicine to help  you relax (sedative). ? A medicine to make you fall asleep (general anesthetic). ? A medicine that is injected into your spine to numb the area below and slightly above the injection site (spinal anesthetic).  The part of your body that drains urine from your bladder (urethra) will be cleaned with a germ-killing solution.  The ureteroscope will be  passed through your urethra into your bladder.  A salt-water solution will be sent through the ureteroscope to fill your bladder. This will help the health care provider see the openings of your ureters more clearly.  The ureteroscope will be passed into your ureter. ? If a growth is found, a biopsy may be done. ? If a stone is found, it may be removed through the ureteroscope, or the stone may be broken up using a laser, shock waves, or electrical energy. ? In some cases, if the ureter is too small, a tube may be inserted that keeps the ureter open (ureteral stent). The stent may be left in place for 1 or 2 weeks to keep the ureter open, and then the ureteroscopy procedure will be done.  The scope will be removed, and your bladder will be emptied. The procedure may vary among health care providers and hospitals.   What can I expect after the procedure? After your procedure, it is common to have:  Your blood pressure, heart rate, breathing rate, and blood oxygen level monitored until you leave the hospital or clinic.  A burning sensation when you urinate. You may be asked to urinate.  Blood in your urine.  Mild discomfort in your bladder area or kidney area when urinating.  A need to urinate more often or urgently. Follow these instructions at home: Medicines  Take over-the-counter and prescription medicines only as told by your health care provider.  If you were prescribed an antibiotic medicine, take it as told by your health care provider. Do not stop taking the antibiotic even if you start to feel better. General instructions  If you were given a sedative during the procedure, it can affect you for several hours. Do not drive or operate machinery until your health care provider says that it is safe.  To relieve burning, take a warm bath or hold a warm washcloth over your groin.  Drink enough fluid to keep your urine pale yellow. ? Drink two 8-ounce (237 mL) glasses of water  every hour for the first 2 hours after you get home. ? Continue to drink water often at home.  You can eat what you normally do.  Keep all follow-up visits as told by your health care provider. This is important. ? If you had a ureteral stent placed, ask your health care provider when you need to return to have it removed.   Contact a health care provider if you have:  Chills or a fever.  Burning pain for longer than 24 hours after the procedure.  Blood in your urine for longer than 24 hours after the procedure. Get help right away if you have:  Large amounts of blood in your urine.  Blood clots in your urine.  Severe pain.  Chest pain or trouble breathing.  The feeling of a full bladder and you are unable to urinate. These symptoms may represent a serious problem that is an emergency. Do not wait to see if the symptoms will go away. Get medical help right away. Call your local emergency services (911 in the [place] Summary  Ureteroscopy is a procedure to  check for and treat problems inside part of the urinary tract.  In this procedure, a thin, flexible tube with a light at the end (ureteroscope) is used to look at the inside of the kidneys and the ureters.  You may need this procedure if you have frequent urinary tract infections (UTIs), blood in your urine, or a stone in a ureter. This information is not intended to replace advice given to you by your health care provider. Make sure you discuss any questions you have with your health care provider. Document Revised: 12/14/2018 Document Reviewed: 12/14/2018 Elsevier Patient Education  [employer].

## 2020-07-09 NOTE — Telephone Encounter (Signed)
It may have been related to some of the left over anesthesia or a reaction from changing positions too quickly.  If he is feeling fine at this point we will keep his follow up as scheduled.  However if it happens again, he should go to the emergency room.  This is so that he can be properly worked up during that time and it will also give [place] a better idea or what may have caused it.

## 2020-07-09 NOTE — Telephone Encounter (Signed)
This encounter was created in error - please disregard.

## 2020-07-09 NOTE — Telephone Encounter (Signed)
Patient states on Sunday during church he "blacked out ". Pt c/o Syncope: STAT if syncope occurred within 30 minutes and pt complains of lightheadedness [place] if episode of passing out, completely, today or in last 24 hours   1. Did you pass out today? no  2. When is the last time you passed out? Sunday 4/17, patient states he had AAA surgery with DR. Schnier last Wednesday, the 13th.   3. Has this occurred multiple times? yes  4. Did you have any symptoms prior to passing out? His eyes "got spots in front of them"

## 2020-07-09 NOTE — Progress Notes (Signed)
07/09/2020 11:40 AM   Garrett Moore 01-Dec-1940 [id number]  Referring provider: [name], MD [place],  [place] [address]  Chief Complaint  Patient presents with  . Benign Prostatic Hypertrophy    HPI: 80 year old male who initially presented with hematuria found to have a 1.6 cm left distal ureteral calculus (hydronephrosis with a duplicated left collecting system).  In addition to this, he was also found to have a fairly large AAA and in the interim, has undergone endovascular AAA repair with bilateral iliac stents as well with Dr. Delana Meyer.  He returns today to discuss definitive management of his ureteral calculus.  He also has a known personal history of prostamegaly with a prostate volume approximately 63 cc.  He is currently on medical management for this.  Returns today accompanied by his daughter.  He reports that surgery went well.  He is going to be on Plavix for quite some time.  He did have an episode over the weekend in church where he passed out.  He is not notified his cardiologist, Dr. [name] or Dr. Delana Meyer about this.  He is feeling well today.  He does have some baseline urinary symptoms including some lower abdominal discomfort urgency frequency.  His urinalysis is consistent with stone.   PMH: Past Medical History:  Diagnosis Date  . Aneurysm of infrarenal abdominal aorta (HCC) 05/10/2020   5.9 cm  . Aortic atherosclerosis ([place])   . Aortic valvar stenosis 02/08/2018   Mild to Moderate Noted on ECHO  . Basal cell carcinoma    Face  . Carotid artery stenosis   . CKD (chronic kidney disease), stage III ([place])   . COPD (chronic obstructive pulmonary disease) ([place])   . Coronary artery disease   . DVT (deep venous thrombosis) ([place])    age 2, leg after Tonsillectomy  . Family history of adverse reaction to anesthesia    mother under anesthesia for extended period for AAA memory problems/dementia after  . Grade I diastolic  dysfunction 35/3614  . Heart murmur   . Hyperlipidemia   . Myocardial infarction ([facility]) 07/1983  . OA (osteoarthritis)     Surgical History: Past Surgical History:  Procedure Laterality Date  . ABDOMINAL AORTIC ANEURYSM REPAIR    . CARDIAC CATHETERIZATION  08/14/1983  . CARDIOVASCULAR STRESS TEST  01/23/2011   normal myocardial perfusion scan demonstrating an attenuation artifact in the inferior region of the myocardium. no ischemia or infarct/scar seen in remaining myocardium  . CAROTID DUPLEX  05/21/2006   right bulb-0-49% diameter reduction; left bulb and proximal ICA-0-49% diameter reduction  . CATARACT EXTRACTION, BILATERAL  01/2019  . CORONARY ANGIOPLASTY  07/1983   PTCA Prox RCA  . DOPPLER ECHOCARDIOGRAPHY  07/21/2012   EF 50-55%, aortic valve noncoronary cusp mobility was severely restricted.  . ENDOVASCULAR REPAIR/STENT GRAFT N/A 07/03/2020   Procedure: ENDOVASCULAR REPAIR/STENT GRAFT;  Surgeon: Katha Cabal, MD;  Location: [facility];  Service: Cardiovascular;  Laterality: N/A;  . ROTATOR CUFF REPAIR Left   . TEMPORARY PACEMAKER  07/1983  . TONSILLECTOMY    . TOTAL HIP ARTHROPLASTY Left 02/21/2019   Procedure: TOTAL HIP ARTHROPLASTY ANTERIOR APPROACH;  Surgeon: Paralee Cancel, MD;  Location: [facility];  Service: Orthopedics;  Laterality: Left;  70 mins    Home Medications:  Allergies as of 07/09/2020      Reactions   Quinolones    Other reaction(s): Has aortic root dilatation      Medication List  Accurate as of July 09, 2020 11:40 AM. If you have any questions, ask your nurse or doctor.        aspirin EC 81 MG tablet Take 81 mg by mouth daily. Swallow whole.   clopidogrel 75 MG tablet Commonly known as: PLAVIX Take 1 tablet (75 mg total) by mouth daily at 6 (six) AM.   loratadine 10 MG tablet Commonly known as: CLARITIN Take by mouth.   multivitamin with minerals Tabs tablet Take 1 tablet by mouth daily.   nicotine polacrilex 2 MG  lozenge Commonly known as: COMMIT 4 mg. What changed: Another medication with the same name was removed. Continue taking this medication, and follow the directions you see here. Changed by: Hollice Espy, MD   simvastatin 80 MG tablet Commonly known as: ZOCOR Take 80 mg by mouth daily at 6 PM.       Allergies:  Allergies  Allergen Reactions  . Quinolones     Other reaction(s): Has aortic root dilatation    Family History: Family History  Problem Relation Age of Onset  . Heart Problems Mother   . AAA (abdominal aortic aneurysm) Mother     Social History:  reports that he has quit smoking. His smoking use included cigarettes. He has a 100.00 pack-year smoking history. He has never used smokeless tobacco. He reports previous alcohol use. He reports that he does not use drugs.   Physical Exam: BP (!) 158/68   Pulse 83   Temp (!) 97.4 F (36.3 C)   Wt 192 lb (87.1 kg)   BMI 26.78 kg/m   Constitutional:  Alert and oriented, No acute distress. HEENT:  AT, moist mucus membranes.  Trachea midline, no masses. Cardiovascular: No clubbing, cyanosis, or edema. Respiratory: Normal respiratory effort, no increased work of breathing. Skin: No rashes, bruises or suspicious lesions. Neurologic: Grossly intact, no focal deficits, moving all 4 extremities. Psychiatric: Normal mood and affect.  Laboratory Data: Lab Results  Component Value Date   WBC 16.8 (H) 07/04/2020   HGB 11.7 (L) 07/04/2020   HCT 34.9 (L) 07/04/2020   MCV 95.1 07/04/2020   PLT 234 07/04/2020    Lab Results  Component Value Date   CREATININE 1.28 (H) 07/04/2020   Urinalysis Urinalysis today with greater than 30 red blood cells, 6-10 white blood cells, no bacteria, nitratei negative.  Pertinent Imaging: CT HEMATURIA WORKUP  Narrative CLINICAL DATA:  Hematuria.  EXAM: CT ABDOMEN AND PELVIS WITHOUT AND WITH CONTRAST  TECHNIQUE: Multidetector CT imaging of the abdomen and pelvis was  performed following the standard protocol before and following the bolus administration of intravenous contrast.  CONTRAST:  173mL OMNIPAQUE IOHEXOL 300 MG/ML  SOLN  COMPARISON:  None.  FINDINGS: Lower chest: Emphysematous changes noted within the lung bases. No acute abnormality.  Hepatobiliary: No focal liver abnormality is seen. No gallstones, gallbladder wall thickening, no focal liver abnormality. Tiny calcified gallstone noted within the fundus. No gallbladder wall inflammation or pericholecystic fluid. No biliary ductal dilatation.  Pancreas: Unremarkable. No pancreatic ductal dilatation or surrounding inflammatory changes.  Spleen: Normal in size without focal abnormality.  Adrenals/Urinary Tract: Normal appearance of the adrenal glands. Bilateral renal vascular calcifications are identified. No kidney stones identified. No hydronephrosis identified bilaterally. Mild distal left hydroureter. Calcification at the left UVJ measures 1.6 cm, image 73/7. Two small left kidney cysts are identified. The largest is in the anterior cortex of the left mid kidney measuring 1.5 cm.  Duplicated left renal collecting system which fuses just  before the urinary bladder, image 82/13.  Stomach/Bowel: Stomach is nondistended. Duodenal diverticulum is identified arising off the medial wall of the descending duodenum measuring 3.8 x 2.6 cm. The appendix is visualized and appears normal. There is no bowel wall thickening, inflammation, or distension. Extensive distal colonic diverticulosis identified without acute inflammation.  Vascular/Lymphatic: Infrarenal abdominal aortic aneurysm measures 5.9 cm in maximum AP dimension. Aneurysmal dilatation of bilateral common iliac arteries. The left common iliac artery measures 3.4 cm. The right common iliac artery measures 4 cm. No abdominopelvic adenopathy.  Reproductive: Mild prostate gland enlargement has mass effect upon the bladder  base.  Other: No ascites or focal fluid collections.  Musculoskeletal: Previous left hip arthroplasty. Multilevel degenerative disc disease noted within the thoracolumbar spine.  IMPRESSION: 1. Urinary tract calculi at the left UVJ measures 1.6 cm and results in mild distal left hydroureter. 2. 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 3. Infrarenal abdominal aortic aneurysm measures 5.9 cm in maximum AP dimension. Recommend referral to a vascular specialist. This recommendation follows ACR consensus guidelines: White Paper of the ACR Incidental Findings Committee II on Vascular Findings. J Am Coll Radiol 2013; 10:789-794. 4. Gallstone. 5. Aortic Atherosclerosis (ICD10-I70.0) and Emphysema (ICD10-J43.9).  These results will be called to the ordering clinician or representative by the Radiologist Assistant, and communication documented in the PACS or [employer].   Electronically Signed By: Kerby Moors M.D. On: 05/10/2020 17:01  Assessment & Plan:    1. Left ureteral calculus 1.6 cm left distal ureteral calculus, partially obstructing  Now that his AAA has been repaired, we discussed definitive management of his stone today.  Given his recent endovascular repair, likely need to remain on Plavix.  As such, would more strongly recommend ureteroscopy.  Risks and benefits of ureteroscopy were reviewed including but not limited to infection, bleeding, pain, ureteral injury which could require open surgery versus prolonged indwelling if ureteral perforation occurs, persistent stone disease, requirement for staged procedure, possible stent, and global anesthesia risks. Patient expressed understanding and desires to proceed with ureteroscopy.  He will need clearance from cardiology as well as vascular surgery.  Okay to remain on Plavix, we had a lengthy risk benefits discussion about this today.  There is a risk of bleeding and poor  visualization although this is likely manageable.  As such, we will plan to continue Plavix intraoperatively.  2. Benign prostatic hyperplasia with urinary obstruction Previously had mention interest for UroLift, will plan to address the stone, complete cystoscopy and evaluate the prostatic anatomy at the time of the above procedure but hold off on UroLift for the time being - Urinalysis, Complete - BLADDER SCAN AMB NON-IMAGING - CULTURE, URINE COMPREHENSIVE  3. Microscopic hematuria Cystoscopy to complete hematuria work-up intraoperatively    Hollice Espy, MD  [facility] [address], [place], [place] [address] [phone]

## 2020-07-09 NOTE — Telephone Encounter (Signed)
Called patient back. Had AAA repair about 1 week ago. States he was in church on Sunday and passed out for a few seconds. Reports he was feeling better on Sunday and that's why he decided to go church. States they had been sitting and standing several times during the service and this was the last time. He had been standing for about 3 minutes and then saw spots and "blacked out." He sat back down on the pew. A nurse and medic in the congregation help assess him but 911 was not called.  Since Sunday, he feels his strength and energy has improved.  He has not had any other episodes of passing out or dizziness.  He admits that Friday and Saturday he did not have much appetite though he thinks he was hydrated. He went to see his urologist this morning who recommended he call cardiologist and vascular surgery.  Advised him that he should speak with Dr Nino Parsley office first and see what they may recommend.  We can certainly schedule him a visit if needed but I feel that since his surgery was just last week, he should consult with vascular surgeon first.  He verbalized understanding and is aware that he should go to the ER or call 911 if this happens again prior to evaluation.

## 2020-07-09 NOTE — Telephone Encounter (Signed)
Patient was made aware with medical recommendations and verbalized understanding 

## 2020-07-10 NOTE — Telephone Encounter (Signed)
Patient was advised by Vascular office. Closing this encounter.

## 2020-07-11 LAB — URINALYSIS, COMPLETE
Bilirubin, UA: NEGATIVE
Glucose, UA: NEGATIVE
Ketones, UA: NEGATIVE
Nitrite, UA: NEGATIVE
Protein,UA: NEGATIVE
Specific Gravity, UA: 1.02 (ref 1.005–1.030)
Urobilinogen, Ur: 0.2 mg/dL (ref 0.2–1.0)
pH, UA: 5 (ref 5.0–7.5)

## 2020-07-11 LAB — MICROSCOPIC EXAMINATION
Bacteria, UA: NONE SEEN
RBC, Urine: >30 /hpf — AB (ref 0–2)

## 2020-07-13 LAB — CULTURE, URINE COMPREHENSIVE

## 2020-07-15 ENCOUNTER — Telehealth (INDEPENDENT_AMBULATORY_CARE_PROVIDER_SITE_OTHER): Payer: Self-pay | Admitting: Vascular Surgery

## 2020-07-15 NOTE — Telephone Encounter (Signed)
Spouse called stating patient just recently had AAA surgery 07/03/2020.  Spouse states in discharge notes that if anything changes per Dr. Delana Meyer within the first 2 weeks after surgery to patients body to call the office.  Spouse states that patient has rash on right foot, doesn't seem to bother patient, but wasn't sure if it had anything to do with surgery.  Please advise.

## 2020-07-15 NOTE — Telephone Encounter (Signed)
Patient spouse has been made aware with medical advice and verbalized understanding

## 2020-07-15 NOTE — Telephone Encounter (Signed)
It is not likely that it is related to the surgery, especially nearly two weeks after surgery.  I would contact your PCP for further workup

## 2020-07-21 DIAGNOSIS — N201 Calculus of ureter: Secondary | ICD-10-CM

## 2020-07-21 HISTORY — DX: Calculus of ureter: N20.1

## 2020-07-23 ENCOUNTER — Other Ambulatory Visit: Payer: Self-pay | Admitting: Urology

## 2020-07-23 DIAGNOSIS — N135 Crossing vessel and stricture of ureter without hydronephrosis: Secondary | ICD-10-CM

## 2020-07-26 ENCOUNTER — Telehealth: Payer: Self-pay | Admitting: Cardiovascular Disease

## 2020-07-26 NOTE — Telephone Encounter (Signed)
    Garrett Moore DOB:  Oct 02, 1940  MRN:  [id number]   Primary Cardiologist: Ida Rogue, MD  Chart reviewed as part of pre-operative protocol coverage.  Appears patient recently underwent recent endovascular repair/stent of his abdominal aortic aneurysm 07/03/20 by vascular surgery and was subsequently started on plavix at that time. Will defer recommendations to holding plavix to Dr. Delana Meyer.   I will route this recommendation to the requesting party via Epic fax function and remove from pre-op pool.  Please call with questions.  Abigail Butts, PA-C 07/26/2020, 4:37 PM

## 2020-07-26 NOTE — Telephone Encounter (Signed)
   [facility] Pre-operative Risk Assessment    Patient Name: Garrett Moore  DOB: [birth year]  MRN: [id number]   HEARTCARE STAFF: - Please ensure there is not already an duplicate clearance open for this procedure. - Under Visit Info/Reason for Call, type in Other and utilize the format Clearance MM/DD/YY or Clearance TBD. Do not use dashes or single digits. - If request is for dental extraction, please clarify the # of teeth to be extracted.  Request for surgical clearance:  1. What type of surgery is being performed? L Ureteroscopy, Laser Lithotripsy, Ureteral Stent  2. When is this surgery scheduled? 08/12/20  3. What type of clearance is required (medical clearance vs. Pharmacy clearance to hold med vs. Both)? Pharmacy   4. Are there any medications that need to be held prior to surgery and how long? Advise on continuation of Plavix  5. Practice name and name of physician performing surgery? [facility], Dr. Erlene Quan  6. What is the office phone number? [phone]   7.   What is the office fax number? [phone]  8.   Anesthesia type (None, local, MAC, general) ? General    Pilar A Ham 07/26/2020, 2:39 PM  _________________________________________________________________   (provider comments below)

## 2020-07-31 ENCOUNTER — Other Ambulatory Visit (INDEPENDENT_AMBULATORY_CARE_PROVIDER_SITE_OTHER): Payer: Self-pay | Admitting: Vascular Surgery

## 2020-07-31 DIAGNOSIS — Z9889 Other specified postprocedural states: Secondary | ICD-10-CM

## 2020-07-31 DIAGNOSIS — I714 Abdominal aortic aneurysm, without rupture, unspecified: Secondary | ICD-10-CM

## 2020-07-31 DIAGNOSIS — Z8679 Personal history of other diseases of the circulatory system: Secondary | ICD-10-CM

## 2020-07-31 DIAGNOSIS — I723 Aneurysm of iliac artery: Secondary | ICD-10-CM

## 2020-07-31 NOTE — Anesthesia Postprocedure Evaluation (Signed)
Anesthesia Post Note  Patient: Garrett Moore  Procedure(s) Performed: ENDOVASCULAR REPAIR/STENT GRAFT (N/A )  Anesthesia Type: General Pain management: pain level controlled Vital Signs Assessment: post-procedure vital signs reviewed and stable Respiratory status: respiratory function stable Cardiovascular status: blood pressure returned to baseline Anesthetic complications: no Comments: Patient was discharged prior to being seen by anesthesia, but all of the notes indicate that he was doing well prior to discharge.   No complications documented.   Last Vitals:  Vitals:   07/04/20 1100 07/04/20 1200  BP: (!) 124/58 (!) 120/49  Pulse: 84 90  Resp: 20 (!) 22  Temp:  36.8 C  SpO2: 97% 94%    Last Pain:  Vitals:   07/04/20 1200  TempSrc: Oral  PainSc: 0-No pain                 Martha Clan

## 2020-08-01 ENCOUNTER — Other Ambulatory Visit (INDEPENDENT_AMBULATORY_CARE_PROVIDER_SITE_OTHER): Payer: Self-pay | Admitting: Nurse Practitioner

## 2020-08-02 ENCOUNTER — Encounter
Admission: RE | Admit: 2020-08-02 | Discharge: 2020-08-02 | Disposition: A | Discharge status: Home / Self Care | Payer: Medicare HMO | Source: Ambulatory Visit | Attending: Urology | Admitting: Urology

## 2020-08-02 ENCOUNTER — Other Ambulatory Visit: Payer: Self-pay

## 2020-08-02 HISTORY — DX: Benign prostatic hyperplasia without lower urinary tract symptoms: N40.0

## 2020-08-02 HISTORY — DX: Peripheral vascular disease, unspecified: I73.9

## 2020-08-02 HISTORY — DX: Personal history of urinary calculi: Z87.442

## 2020-08-02 HISTORY — DX: Cardiac arrhythmia, unspecified: I49.9

## 2020-08-02 NOTE — Patient Instructions (Addendum)
INSTRUCTIONS FOR SURGERY     Your surgery is scheduled for:   Monday, MAY 23RD     To find out your arrival time for the day of surgery,          please call [phone] between 1 pm and 3 pm on :  Friday, MAY 20TH     When you arrive for surgery, report to the [facility].  ONCE THEY HAVE FINISHED THEIR PROCESS, PROCEED TO THE SECOND FLOOR AND SIGN IN AT THE SURGERY DESK.    REMEMBER: Instructions that are not followed completely may result in serious medical risk,  up to and including death, or upon the discretion of your surgeon and anesthesiologist,            your surgery may need to be rescheduled.  __X__ 1. Do not eat food after midnight the night before your procedure.                    No gum, candy, lozenger, tic tacs, tums or hard candies.                  ABSOLUTELY NOTHING SOLID IN YOUR MOUTH AFTER MIDNIGHT                    You may drink unlimited clear liquids up to 2 hours before you are scheduled to arrive for surgery.                   Do not drink anything within those 2 hours unless you need to take medicine, then take the                   smallest amount you need.  Clear liquids include:  water, apple juice without pulp,                   any flavor Gatorade, Black coffee, black tea.  Sugar may be added but no dairy/ honey /lemon.                        Broth and jello is not considered a clear liquid.  __x__  2. On the morning of surgery, please brush your teeth with toothpaste and water. You may rinse with                  mouthwash if you wish but DO NOT SWALLOW TOOTHPASTE OR MOUTHWASH  __X___3. NO alcohol for 24 hours before or after surgery.  __x___ 4.  Do NOT smoke or use e-cigarettes for 24 HOURS PRIOR TO SURGERY.                      DO NOT Use any chewable tobacco products for at least 6 hours prior to surgery.  __x___ 5. If you start any new  medication after this appointment and prior to surgery, please                   Bring it with you on the day of surgery.  ___x__ 6. Notify  your doctor if there is any change in your medical condition, such as fever,                  infection, vomitting, diarrhea or any open sores.  __x___ 7.  USE ANTIBACTERIAL SOAP as instructed, the night before surgery and the day of surgery.                   Once you have washed with this soap, do NOT use any of the following: Powders, perfumes                    or lotions. Please do not wear make up, hairpins, clips or nail polish. You MAY wear deodorant.                                                               Men may shave their face and neck.                     DO NOT wear ANY jewelry on the day of surgery. If there are rings that are too tight to                    remove easily, please address this prior to the surgery day. Piercings need to be removed.                                                                     NO METAL ON YOUR BODY.                    Do NOT bring any valuables.  If you came to Pre-Admit testing then you will not need license,                     insurance card or credit card.  If you will be staying overnight, please either leave your things in                     the car or have your family be responsible for these items.                     [facility] IS NOT RESPONSIBLE FOR BELONGINGS OR VALUABLES.  ___X__ 8. DO NOT wear contact lenses on surgery day.  You may not have dentures,                     Hearing aides, contacts or glasses in the operating room. These items can be                    Placed in the Recovery Room to receive immediately after surgery.  __x___ 9. IF YOU ARE SCHEDULED TO GO HOME ON THE SAME DAY, YOU MUST                   Have someone to drive you home and to stay with you  for the first 24 hours.                    Have an arrangement prior to arriving on surgery day.  ___x__ 10. Take  the following medications on the morning of surgery with a sip of water:                              1. nothing                     2.  _____ 11.  Follow any instructions provided to you by your surgeon.                        Such as enema, clear liquid bowel prep  __X__  12. DO NOT STOP PLAVIX OR ASPIRIN ( as discussed with Dr. Erlene Quan).                     Continue taking both up until the day of surgery, but do not take either on                     Surgery day.  Do not take any other aspirin products.                       THIS INCLUDES BC POWDERS / GOODIES POWDER  __x___ 13. STOP Anti-inflammatories as of one week prior to surgery, 08/05/20.                      This includes IBUPROFEN / MOTRIN / ADVIL / ALEVE/ NAPROXYN                    YOU MAY TAKE TYLENOL ANY TIME PRIOR TO SURGERY.  _X____ 14.  Stop supplements until after surgery.                     This includes: MULTIVITAMINIS                   _____ 15. Bring your CPAP machine into preop with you on the morning of surgery.  __X____17.  Continue to take the following medications but do not take on the morning of surgery:                    PLAVIX // ASPIRIN // NICOTINE LOZENGERS  ___X___18. Wear clean and comfortable clothing to the hospital.  BRING PHONE NUMBERS FOR [facility].   MAKE SURE TO DRINK DRINK DRINK ONCE YOU GET HOME AFTER SURGERY.

## 2020-08-02 NOTE — Pre-Procedure Instructions (Signed)
Spoke with patient via phone today to complete his preoperative surgery interview.  There was discussion between Dr. Erlene Quan and himself regarding his Plavix and if he should stop it prior to surgery. According to Dr. Erlene Quan, he would be okay to continue taking his anticoagulants up until surgery.  I explained to patient to continue this regime but NOT to take either Plavix or Aspirin on day of surgery.  Patient stated his understanding of this information.

## 2020-08-05 ENCOUNTER — Other Ambulatory Visit: Payer: Self-pay

## 2020-08-05 ENCOUNTER — Ambulatory Visit (INDEPENDENT_AMBULATORY_CARE_PROVIDER_SITE_OTHER): Payer: Medicare HMO

## 2020-08-05 ENCOUNTER — Ambulatory Visit (INDEPENDENT_AMBULATORY_CARE_PROVIDER_SITE_OTHER): Payer: Medicare HMO | Admitting: Vascular Surgery

## 2020-08-05 ENCOUNTER — Encounter (INDEPENDENT_AMBULATORY_CARE_PROVIDER_SITE_OTHER): Payer: Self-pay | Admitting: Vascular Surgery

## 2020-08-05 VITALS — BP 126/76 | HR 84 | Ht 71.0 in | Wt 187.0 lb

## 2020-08-05 DIAGNOSIS — Z8679 Personal history of other diseases of the circulatory system: Secondary | ICD-10-CM | POA: Diagnosis not present

## 2020-08-05 DIAGNOSIS — Z9889 Other specified postprocedural states: Secondary | ICD-10-CM

## 2020-08-05 DIAGNOSIS — Z79899 Other long term (current) drug therapy: Secondary | ICD-10-CM | POA: Insufficient documentation

## 2020-08-05 DIAGNOSIS — I714 Abdominal aortic aneurysm, without rupture, unspecified: Secondary | ICD-10-CM

## 2020-08-05 DIAGNOSIS — J449 Chronic obstructive pulmonary disease, unspecified: Secondary | ICD-10-CM | POA: Insufficient documentation

## 2020-08-05 DIAGNOSIS — E782 Mixed hyperlipidemia: Secondary | ICD-10-CM | POA: Insufficient documentation

## 2020-08-05 DIAGNOSIS — H409 Unspecified glaucoma: Secondary | ICD-10-CM | POA: Insufficient documentation

## 2020-08-05 DIAGNOSIS — I2510 Atherosclerotic heart disease of native coronary artery without angina pectoris: Secondary | ICD-10-CM | POA: Insufficient documentation

## 2020-08-05 DIAGNOSIS — N400 Enlarged prostate without lower urinary tract symptoms: Secondary | ICD-10-CM | POA: Insufficient documentation

## 2020-08-05 DIAGNOSIS — R159 Full incontinence of feces: Secondary | ICD-10-CM | POA: Insufficient documentation

## 2020-08-05 DIAGNOSIS — N183 Chronic kidney disease, stage 3 unspecified: Secondary | ICD-10-CM | POA: Insufficient documentation

## 2020-08-05 DIAGNOSIS — I723 Aneurysm of iliac artery: Secondary | ICD-10-CM | POA: Diagnosis not present

## 2020-08-05 DIAGNOSIS — C4491 Basal cell carcinoma of skin, unspecified: Secondary | ICD-10-CM | POA: Insufficient documentation

## 2020-08-05 DIAGNOSIS — I359 Nonrheumatic aortic valve disorder, unspecified: Secondary | ICD-10-CM | POA: Insufficient documentation

## 2020-08-05 DIAGNOSIS — E785 Hyperlipidemia, unspecified: Secondary | ICD-10-CM | POA: Insufficient documentation

## 2020-08-05 DIAGNOSIS — N1831 Chronic kidney disease, stage 3a: Secondary | ICD-10-CM | POA: Insufficient documentation

## 2020-08-05 NOTE — Progress Notes (Signed)
Patient ID: Garrett Moore, male   DOB: [birth year], 80 y.o.   MRN: [id number]  Chief Complaint  Patient presents with  . Follow-up    1 mo post F/U evar graft     HPI Garrett Moore is a 80 y.o. male.    The patient returns to the office for surveillance of an abdominal aortic aneurysm status post stent graft placement on 07/03/2020.   Patient denies abdominal pain or back pain, no other abdominal complaints. No groin related complaints. No symptoms consistent with distal embolization No changes in claudication distance.   There have been no interval changes in his overall healthcare since his last visit.   Patient denies amaurosis fugax or TIA symptoms. There is no history of claudication or rest pain symptoms of the lower extremities. The patient denies angina or shortness of breath.   Duplex US of the aorta and iliac arteries shows a 4.5 cm AAA sac with no endoleak  Past Medical History:  Diagnosis Date  . Aneurysm of infrarenal abdominal aorta (HCC) 05/10/2020   5.9 cm  . Aortic atherosclerosis ([place])   . Aortic valvar stenosis 02/08/2018   Mild to Moderate Noted on ECHO  . Aortic valve stenosis, moderate 07/2020   per dr. [name]  . Basal cell carcinoma    Face  . BPH (benign prostatic hyperplasia)   . Carotid artery stenosis   . CKD (chronic kidney disease), stage III ([place])   . COPD (chronic obstructive pulmonary disease) ([place])   . Coronary artery disease   . DVT (deep venous thrombosis) ([place])    age 61, leg after Tonsillectomy  . Dysrhythmia    atrial fibrillation  . Family history of adverse reaction to anesthesia    mother under anesthesia for extended period for AAA memory problems/dementia after  . Grade I diastolic dysfunction 20/2542  . Heart murmur   . History of kidney stones   . Hyperlipidemia   . Myocardial infarction ([place]) 07/1983   IABP overnight then removed and nothing else.  . OA (osteoarthritis)   . Peripheral vascular disease ([place])   .  Ureteral calculus, left 07/2020    Past Surgical History:  Procedure Laterality Date  . ABDOMINAL AORTIC ANEURYSM REPAIR    . CARDIAC CATHETERIZATION  08/14/1983  . CARDIOVASCULAR STRESS TEST  01/23/2011   normal myocardial perfusion scan demonstrating an attenuation artifact in the inferior region of the myocardium. no ischemia or infarct/scar seen in remaining myocardium  . CAROTID DUPLEX  05/21/2006   right bulb-0-49% diameter reduction; left bulb and proximal ICA-0-49% diameter reduction  . CATARACT EXTRACTION, BILATERAL  01/2019  . CORONARY ANGIOPLASTY  07/1983   PTCA Prox RCA  . DOPPLER ECHOCARDIOGRAPHY  07/21/2012   EF 50-55%, aortic valve noncoronary cusp mobility was severely restricted.  . ENDOVASCULAR REPAIR/STENT GRAFT N/A 07/03/2020   Procedure: ENDOVASCULAR REPAIR/STENT GRAFT;  Surgeon: Katha Cabal, MD;  Location: [facility];  Service: Cardiovascular;  Laterality: N/A;  . EYE SURGERY Bilateral    cataracts  . ROTATOR CUFF REPAIR Left   . TEMPORARY PACEMAKER  07/1983  . TONSILLECTOMY    . TOTAL HIP ARTHROPLASTY Left 02/21/2019   Procedure: TOTAL HIP ARTHROPLASTY ANTERIOR APPROACH;  Surgeon: Paralee Cancel, MD;  Location: [facility];  Service: Orthopedics;  Laterality: Left;  70 mins  . VITRECTOMY Bilateral       Allergies  Allergen Reactions  . Quinolones     Other reaction(s): Has aortic root dilatation  Current Outpatient Medications  Medication Sig Dispense Refill  . acetaminophen (TYLENOL) 500 MG tablet Take 500 mg by mouth every 6 (six) hours as needed.    Marland Kitchen aspirin EC 81 MG tablet Take 81 mg by mouth daily. Swallow whole.    . clopidogrel (PLAVIX) 75 MG tablet Take 1 tablet (75 mg total) by mouth daily at 6 (six) AM. 90 tablet 3  . Multiple Vitamin (MULTIVITAMIN WITH MINERALS) TABS tablet Take 1 tablet by mouth daily.    . nicotine polacrilex (COMMIT) 2 MG lozenge Take 2 mg by mouth as needed for smoking cessation.    . simvastatin (ZOCOR) 80 MG  tablet Take 80 mg by mouth daily at 6 PM.      No current facility-administered medications for this visit.        Physical Exam BP 126/76   Pulse 84   Ht 5\' 11"  (1.803 m)   Wt 187 lb (84.8 kg)   BMI 26.08 kg/m  Gen:  WD/WN, NAD Skin: incision C/D/I, 1+ palpable popliteal pulses bilaterally not broadened     Assessment/Plan: 1. AAA (abdominal aortic aneurysm) without rupture (HCC) Recommend: Patient is status post successful endovascular repair of the AAA.   No further intervention is required at this time.   No endoleak is detected and the aneurysm sac is stable.  The patient will continue antiplatelet therapy as prescribed as well as aggressive management of hyperlipidemia. Exercise is again strongly encouraged.   However, endografts require continued surveillance with ultrasound or CT scan. This is mandatory to detect any changes that allow repressurization of the aneurysm sac.  The patient is informed that this would be asymptomatic.  The patient is reminded that lifelong routine surveillance is a necessity with an endograft. Patient will continue to follow-up at 6 month intervals with ultrasound of the aorta.      Garrett Moore 08/05/2020, 8:44 AM   This note was created with Dragon medical transcription system.  Any errors from dictation are unintentional.

## 2020-08-08 ENCOUNTER — Encounter: Payer: Self-pay | Admitting: Urology

## 2020-08-08 NOTE — Progress Notes (Signed)
Perioperative Services Pre-Admission/Anesthesia Testing    Date: 08/08/20  Name: Garrett Moore MRN:   [id number]  Re: Clearance for surgery   Case: [id number] Date/Time: 08/12/20 8315   Procedure: CYSTOSCOPY/URETEROSCOPY/HOLMIUM LASER/STENT PLACEMENT (Left )   Anesthesia type: Choice   Pre-op diagnosis: Left UVJ stone   Location: [facility] OR ROOM 10 / [place] [facility] [facility] FOR ANESTHESIA [facility]   Surgeons: Hollice Espy, MD    Patient is scheduled for the above procedure on 08/12/2020 with Dr. Hollice Espy.  Patient recently reviewed by PAT APP prior to EVAR procedure performed on 07/03/2020; see my note dated 07/01/2020.  Interval history reviewed:   Patient's EVAR and iliac stenting procedure noted to be uncomplicated.  He was discharged home on POD-1 on daily DAPT therapy (ASA + clopidogrel).     On 07/09/2020 patient experienced a syncopal episode while. He was assessed by a [place], however did not seek medical attention for this episode. Patient did call vascular practice and episode was felt to be related to orthostatic changes vs residual/late effects of the anesthesia used for his surgery. Patient was advised to go to the ED should recurrent episodes occur.   No other significant changes or interval events noted since previous procedure.  ECG up to date. Patient previously cleared by cardiology for the EVAR.  Presurgical clearance was also sought from vascular surgery.  Specialty clearances obtained as follows:   Per cardiology, "patient may proceed with planned urological procedure at an overall ACCEPTABLE risk".   Per vascular surgery [name], NP-C), "patient may proceed with planned urological intervention at an overall LOW risk of significant perioperative cardiovascular complications".  Again, this patient is on daily DAPT therapy.  He has been instructed on recommendations from vascular surgery for holding his clopidogrel for 5 days prior to his procedure with  plans to resume 2 days postoperatively, or when postoperative bleeding risk benefit minimized by primary attending surgeon.  The patient will continue his daily low-dose ASA throughout the perioperative period. Vascular surgery adds that the patient "must have prophylactic antibiotics". Clarification sought from vascular provider who advised that the recommendation was for preoperative prophylaxis in SDS just prior to the procedure to protect the vascular graft.   Interval Work Up:   Lab Results  Component Value Date   WBC 16.8 (H) 07/04/2020   HGB 11.7 (L) 07/04/2020   HCT 34.9 (L) 07/04/2020   MCV 95.1 07/04/2020   PLT 234 07/04/2020   Lab Results  Component Value Date   NA 137 07/04/2020   K 4.3 07/04/2020   CO2 22 07/04/2020   BUN 15 07/04/2020   CREATININE 1.28 (H) 07/04/2020   CALCIUM 8.5 (L) 07/04/2020   GLUCOSE 148 (H) 07/04/2020    Impression and Plan:  Garrett Moore has been referred for pre-anesthesia review and clearance prior to him undergoing the planned anesthetic and procedural courses. Available labs, pertinent testing, and imaging results were personally reviewed by me. This patient has been appropriately cleared by cardiology (ACCEPTABLE) and vascular surgery (LOW) with the individually noted risks of potential significant perioperative cardiovascular complications.  Based on clinical review performed today (08/08/20), barring any significant acute changes in the patient's overall condition, it is anticipated that he will be able to proceed with the planned surgical intervention. Any acute changes in clinical condition may necessitate his procedure being postponed and/or cancelled. Patient will meet with anesthesia team (MD and/or CRNA) on this day of his procedure for preoperative evaluation/assessment.   Pre-surgical instructions  were reviewed with the patient during his PAT appointment and questions were fielded by PAT clinical staff. Patient was advised that if  any questions or concerns arise prior to his procedure then he should return a call to PAT and/or his surgeon's office to discuss.  Honor Loh, MSN, APRN, FNP-C, CEN [facility]  Peri-operative Services Nurse Practitioner Phone: [phone] 08/08/20 11:56 AM  NOTE: This note has been prepared using Dragon dictation software. Despite my best ability to proofread, there is always the potential that unintentional transcriptional errors may still occur from this process.

## 2020-08-12 ENCOUNTER — Ambulatory Visit: Payer: Medicare HMO | Admitting: Urgent Care

## 2020-08-12 ENCOUNTER — Encounter
Admission: RE | Disposition: A | Discharge status: Home / Self Care | Payer: Self-pay | Source: Home / Self Care | Attending: Urology

## 2020-08-12 ENCOUNTER — Ambulatory Visit: Payer: Medicare HMO

## 2020-08-12 ENCOUNTER — Other Ambulatory Visit: Payer: Self-pay

## 2020-08-12 ENCOUNTER — Ambulatory Visit
Admission: RE | Admit: 2020-08-12 | Discharge: 2020-08-12 | Disposition: A | Discharge status: Home / Self Care | Payer: Medicare HMO | Attending: Urology | Admitting: Urology

## 2020-08-12 ENCOUNTER — Encounter: Payer: Self-pay | Admitting: Urology

## 2020-08-12 DIAGNOSIS — Z87891 Personal history of nicotine dependence: Secondary | ICD-10-CM | POA: Diagnosis not present

## 2020-08-12 DIAGNOSIS — Z7982 Long term (current) use of aspirin: Secondary | ICD-10-CM | POA: Insufficient documentation

## 2020-08-12 DIAGNOSIS — N201 Calculus of ureter: Secondary | ICD-10-CM | POA: Diagnosis not present

## 2020-08-12 DIAGNOSIS — Q638 Other specified congenital malformations of kidney: Secondary | ICD-10-CM | POA: Insufficient documentation

## 2020-08-12 DIAGNOSIS — N135 Crossing vessel and stricture of ureter without hydronephrosis: Secondary | ICD-10-CM

## 2020-08-12 DIAGNOSIS — Z7902 Long term (current) use of antithrombotics/antiplatelets: Secondary | ICD-10-CM | POA: Diagnosis not present

## 2020-08-12 DIAGNOSIS — I5030 Unspecified diastolic (congestive) heart failure: Secondary | ICD-10-CM | POA: Insufficient documentation

## 2020-08-12 DIAGNOSIS — Z8249 Family history of ischemic heart disease and other diseases of the circulatory system: Secondary | ICD-10-CM | POA: Insufficient documentation

## 2020-08-12 DIAGNOSIS — Z881 Allergy status to other antibiotic agents status: Secondary | ICD-10-CM | POA: Insufficient documentation

## 2020-08-12 DIAGNOSIS — J439 Emphysema, unspecified: Secondary | ICD-10-CM | POA: Insufficient documentation

## 2020-08-12 DIAGNOSIS — Z79899 Other long term (current) drug therapy: Secondary | ICD-10-CM | POA: Diagnosis not present

## 2020-08-12 DIAGNOSIS — E782 Mixed hyperlipidemia: Secondary | ICD-10-CM | POA: Diagnosis not present

## 2020-08-12 DIAGNOSIS — Z86718 Personal history of other venous thrombosis and embolism: Secondary | ICD-10-CM | POA: Insufficient documentation

## 2020-08-12 DIAGNOSIS — N183 Chronic kidney disease, stage 3 unspecified: Secondary | ICD-10-CM | POA: Insufficient documentation

## 2020-08-12 HISTORY — PX: CYSTOSCOPY W/ RETROGRADES: SHX1426

## 2020-08-12 HISTORY — PX: CYSTOSCOPY/URETEROSCOPY/HOLMIUM LASER/STENT PLACEMENT: SHX6546

## 2020-08-12 HISTORY — DX: Unspecified atrial fibrillation: I48.91

## 2020-08-12 SURGERY — CYSTOSCOPY/URETEROSCOPY/HOLMIUM LASER/STENT PLACEMENT
Anesthesia: General | Laterality: Left

## 2020-08-12 MED ORDER — DEXAMETHASONE SODIUM PHOSPHATE 10 MG/ML IJ SOLN
INTRAMUSCULAR | Status: DC | PRN
  Administered 2020-08-12: 5 mg via INTRAVENOUS

## 2020-08-12 MED ORDER — OXYBUTYNIN CHLORIDE 5 MG PO TABS: 5.0000 mg | ORAL_TABLET | Freq: Three times a day (TID) | ORAL | 0 refills | Status: DC | PRN

## 2020-08-12 MED ORDER — LACTATED RINGERS IV SOLN: INTRAVENOUS | Status: DC

## 2020-08-12 MED ORDER — FENTANYL CITRATE (PF) 100 MCG/2ML IJ SOLN
INTRAMUSCULAR | Status: DC | PRN
  Administered 2020-08-12 (×4): 25 ug via INTRAVENOUS

## 2020-08-12 MED ORDER — PROPOFOL 10 MG/ML IV BOLUS
INTRAVENOUS | Status: AC
  Filled 2020-08-12: qty 20

## 2020-08-12 MED ORDER — FAMOTIDINE 20 MG PO TABS
ORAL_TABLET | ORAL | Status: AC
  Administered 2020-08-12: 20 mg
  Filled 2020-08-12: qty 1

## 2020-08-12 MED ORDER — MIDAZOLAM HCL 2 MG/2ML IJ SOLN
INTRAMUSCULAR | Status: AC
  Filled 2020-08-12: qty 2

## 2020-08-12 MED ORDER — FAMOTIDINE 20 MG PO TABS
20.0000 mg | ORAL_TABLET | Freq: Once | ORAL | Status: AC
  Administered 2020-08-12: 20 mg via ORAL

## 2020-08-12 MED ORDER — FENTANYL CITRATE (PF) 100 MCG/2ML IJ SOLN: 25.0000 ug | INTRAMUSCULAR | Status: DC | PRN

## 2020-08-12 MED ORDER — FENTANYL CITRATE (PF) 100 MCG/2ML IJ SOLN
INTRAMUSCULAR | Status: AC
  Filled 2020-08-12: qty 2

## 2020-08-12 MED ORDER — ONDANSETRON HCL 4 MG/2ML IJ SOLN
INTRAMUSCULAR | Status: DC | PRN
  Administered 2020-08-12: 4 mg via INTRAVENOUS

## 2020-08-12 MED ORDER — MEPERIDINE HCL 25 MG/ML IJ SOLN
6.2500 mg | INTRAMUSCULAR | Status: DC | PRN
Start: 2020-08-12 — End: 2020-08-12

## 2020-08-12 MED ORDER — CEFAZOLIN SODIUM-DEXTROSE 2-4 GM/100ML-% IV SOLN
2.0000 g | INTRAVENOUS | Status: AC
  Administered 2020-08-12: 2 g via INTRAVENOUS

## 2020-08-12 MED ORDER — IOHEXOL 180 MG/ML  SOLN
INTRAMUSCULAR | Status: DC | PRN
  Administered 2020-08-12: 5 mL

## 2020-08-12 MED ORDER — LIDOCAINE HCL (PF) 2 % IJ SOLN
INTRAMUSCULAR | Status: DC | PRN
  Administered 2020-08-12: 30 mg

## 2020-08-12 MED ORDER — ORAL CARE MOUTH RINSE: 15.0000 mL | Freq: Once | OROMUCOSAL | Status: AC

## 2020-08-12 MED ORDER — HYDROCODONE-ACETAMINOPHEN 5-325 MG PO TABS: 1.0000 | ORAL_TABLET | Freq: Four times a day (QID) | ORAL | 0 refills | Status: DC | PRN

## 2020-08-12 MED ORDER — PHENYLEPHRINE HCL (PRESSORS) 10 MG/ML IV SOLN
INTRAVENOUS | Status: DC | PRN
  Administered 2020-08-12 (×6): 50 ug via INTRAVENOUS

## 2020-08-12 MED ORDER — ONDANSETRON HCL 4 MG/2ML IJ SOLN: 4.0000 mg | Freq: Once | INTRAMUSCULAR | Status: DC | PRN

## 2020-08-12 MED ORDER — FLUORESCEIN SODIUM 10 % IV SOLN
INTRAVENOUS | Status: AC
  Filled 2020-08-12: qty 5

## 2020-08-12 MED ORDER — TAMSULOSIN HCL 0.4 MG PO CAPS: 0.4000 mg | ORAL_CAPSULE | Freq: Every day | ORAL | 0 refills | Status: DC

## 2020-08-12 MED ORDER — FLUORESCEIN SODIUM 10 % IV SOLN
INTRAVENOUS | Status: DC | PRN
  Administered 2020-08-12: .5 mL via INTRAVENOUS

## 2020-08-12 MED ORDER — CHLORHEXIDINE GLUCONATE 0.12 % MT SOLN
15.0000 mL | Freq: Once | OROMUCOSAL | Status: AC
  Administered 2020-08-12: 15 mL via OROMUCOSAL

## 2020-08-12 MED ORDER — CHLORHEXIDINE GLUCONATE 0.12 % MT SOLN
OROMUCOSAL | Status: AC
  Filled 2020-08-12: qty 15

## 2020-08-12 MED ORDER — PROPOFOL 10 MG/ML IV BOLUS
INTRAVENOUS | Status: DC | PRN
  Administered 2020-08-12: 100 mg via INTRAVENOUS
  Administered 2020-08-12: 30 mg via INTRAVENOUS

## 2020-08-12 MED ORDER — CEFAZOLIN SODIUM-DEXTROSE 2-4 GM/100ML-% IV SOLN
INTRAVENOUS | Status: AC
  Filled 2020-08-12: qty 100

## 2020-08-12 SURGICAL SUPPLY — 31 items
BAG DRAIN CYSTO-URO LG1000N (MISCELLANEOUS) ×3 IMPLANT
BASKET ZERO TIP 1.9FR (BASKET) ×3 IMPLANT
BRUSH SCRUB EZ 1% IODOPHOR (MISCELLANEOUS) ×3 IMPLANT
BSKT STON RTRVL ZERO TP 1.9FR (BASKET) ×2
CATH URET FLEX-TIP 2 LUMEN 10F (CATHETERS) IMPLANT
CATH URETL 5X70 OPEN END (CATHETERS) ×3 IMPLANT
CNTNR SPEC 2.5X3XGRAD LEK (MISCELLANEOUS)
CONT SPEC 4OZ STER OR WHT (MISCELLANEOUS)
CONT SPEC 4OZ STRL OR WHT (MISCELLANEOUS)
CONTAINER SPEC 2.5X3XGRAD LEK (MISCELLANEOUS) IMPLANT
DRAPE UTILITY 15X26 TOWEL STRL (DRAPES) ×3 IMPLANT
ELECT LOOP 22F BIPOLAR SML (ELECTROSURGICAL) ×3
ELECTRODE LOOP 22F BIPOLAR SML (ELECTROSURGICAL) ×2 IMPLANT
GLIDEWIRE STIFF .35X180X3 HYDR (WIRE) ×3 IMPLANT
GLOVE SURG ENC MOIS LTX SZ6.5 (GLOVE) ×3 IMPLANT
GOWN STRL REUS W/ TWL LRG LVL3 (GOWN DISPOSABLE) ×4 IMPLANT
GOWN STRL REUS W/TWL LRG LVL3 (GOWN DISPOSABLE) ×6
GUIDEWIRE GREEN .038 145CM (MISCELLANEOUS) IMPLANT
GUIDEWIRE STR DUAL SENSOR (WIRE) ×6 IMPLANT
INFUSOR MANOMETER BAG 3000ML (MISCELLANEOUS) ×3 IMPLANT
IV NS IRRIG 3000ML ARTHROMATIC (IV SOLUTION) ×12 IMPLANT
KIT TURNOVER CYSTO (KITS) ×3 IMPLANT
MANIFOLD NEPTUNE II (INSTRUMENTS) ×3 IMPLANT
PACK CYSTO AR (MISCELLANEOUS) ×3 IMPLANT
SET CYSTO W/LG BORE CLAMP LF (SET/KITS/TRAYS/PACK) ×3 IMPLANT
SHEATH URETERAL 12FRX35CM (MISCELLANEOUS) IMPLANT
STENT URET 6FRX24 CONTOUR (STENTS) IMPLANT
STENT URET 6FRX26 CONTOUR (STENTS) ×3 IMPLANT
SURGILUBE 2OZ TUBE FLIPTOP (MISCELLANEOUS) ×3 IMPLANT
TRACTIP FLEXIVA PULSE ID 200 (Laser) ×3 IMPLANT
WATER STERILE IRR 1000ML POUR (IV SOLUTION) ×3 IMPLANT

## 2020-08-12 NOTE — Discharge Instructions (Signed)
You have a ureteral stent in place.  This is a tube that extends from your kidney to your bladder.  This may cause urinary bleeding, burning with urination, and urinary frequency.  Please call our office or present to the ED if you develop fevers >101 or pain which is not able to be controlled with oral pain medications.  You may be given either Flomax and/ or ditropan to help with bladder spasms and stent pain in addition to pain medications.    You may continue your aspirin and Plavix.  If your urine becomes very bloody or you are passing large clot or not able to void, stop taking these medications and call our office.  [facility] [address] [place], [place] [address] [phone]    AMBULATORY SURGERY  DISCHARGE INSTRUCTIONS   1) The drugs that you were given will stay in your system until tomorrow so for the next 24 hours you should not:  A) Drive an automobile B) Make any legal decisions C) Drink any alcoholic beverage   2) You may resume regular meals tomorrow.  Today it is better to start with liquids and gradually work up to solid foods.  You may eat anything you prefer, but it is better to start with liquids, then soup and crackers, and gradually work up to solid foods.   3) Please notify your doctor immediately if you have any unusual bleeding, trouble breathing, redness and pain at the surgery site, drainage, fever, or pain not relieved by medication.    4) Additional Instructions:        Please contact your physician with any problems or Same Day Surgery at [phone], Monday through Friday 6 am to 4 pm, or [place] at [facility] number at [phone].

## 2020-08-12 NOTE — Op Note (Signed)
Date of procedure: 08/12/20  Preoperative diagnosis:  1. Complete duplication of left collecting system 2. Left UVJ stone  Postoperative diagnosis:  1. Same as above  Procedure: 1. Cystoscopy 2. Left retrograde pyelogram 3. Left ureteroscopy with laser lithotripsy 4. Left ureteral stent placement  Surgeon: Hollice Espy, MD  Anesthesia: General  Complications: None  Intraoperative findings: Difficult anatomy with complete duplication of left collecting system and distortion of the upper pole moiety due to mass-effect from the stone as well as overlying edema.  Ultimately successful in identifying the correct UO and obliterating stone.  EBL: Minimal  Specimens: Stone fragment  Drains: 6 x 26 French double-J ureteral stent on left, upper pole moiety  Indication: Garrett Moore is a 80 y.o. patient with obstructing 1.6 cm left UVJ stone as well as an incidental AAA which was treated prior to this procedure today currently on aspirin and Plavix.  After reviewing the management options for treatment, he elected to proceed with the above surgical procedure(s). We have discussed the potential benefits and risks of the procedure, side effects of the proposed treatment, the likelihood of the patient achieving the goals of the procedure, and any potential problems that might occur during the procedure or recuperation. Informed consent has been obtained.  Description of procedure:  The patient was taken to the operating room and general anesthesia was induced.  The patient was placed in the dorsal lithotomy position, prepped and draped in the usual sterile fashion, and preoperative antibiotics were administered. A preoperative time-out was performed.   Male sounds were used to dilate the urethra.  A 21 French scope was advanced per urethra into the bladder.  Immediately, the left hemitrigone was noted to be massively distorted with overlying bullous edema.  The UO was not readily visible.   Ultimately, I was able to find the more lateral and superior of the UOs, the lower pole moiety which was cannulated with a wire.  I advanced a 5 [place] open-ended ureteral catheter through this and showed a retrograde pyelogram.  This point in time, it became clear that there was a complete duplication of the distal ureters and this was not the ureter which contained the stone or any obstruction.  To be certain, I advanced a semirigid ureteroscope through the distal ureter and no stone was identified.  The wire and scope were then removed.  Attention was then turned back to the left hemitrigone.  After quite some time, was never able to find the UO.  We administered fluorescein and again, due to edema, some manipulation of friable overlying bullous tissue, visualization became quite poor.  He gets a flare seen coming from the other left ureter as well as the right ureter but not the obstructed ureter in question.  Because of visualization issues, I brought in a resectoscope to allow for continuous irrigation.  I used a bipolar loop to manipulate the tissue overlying the expected location of the more medial left ureter UO.  With some manipulation, did see a small wisp of floor seen.  I then attempted to get a wire around the stone unsuccessfully with a sensor wire.  Ultimately, using some manipulation as well as a angled Glidewire, I was able ultimately to get the wire past the stone.  I then advanced a 5 [place] open-ended ureteral catheter into the mid ureter over the wire and exchanged the wire for a sensor wire.  The wire was then left in place as a safety wire and the scope and  access sheath were removed.  Next, a semirigid ureteroscope was then brought in and using a pressure bag, is able to advance the scope to the level of the stone.  A 242 m laser fiber was used with both dusting settings of 0.3 and 60 Hz as well as more fragmentation settings of 0.8 J and 10 Hz to fragment the stone into tiny pieces.  A  1.9 tipless nitinol basket was then used to extract each and every piece.  Advance the scope up to the mid ureter no additional fragments was identified.  Lastly, the safety wire was backloaded over rigid cystoscope.  A 6 x 26 French double-J ureteral stent was advanced over the wire up to level the proximal ureter.  Upon removing the wire, there is a full coil both within the bladder as well as within the upper pole moiety of the kidney.  The bladder was then irrigated and a few fragments of stone were evacuated and sent off for stone analysis.  The patient was then cleaned and dried, repositioned in supine position, reversed of anesthesia, and taken to the PACU in stable condition.  Plan: We will have him return next week for cystoscopy, stent removal.  He may continue aspirin and Plavix unless he develops severe hematuria.  Hollice Espy, M.D.

## 2020-08-12 NOTE — Anesthesia Procedure Notes (Addendum)
Procedure Name: LMA Insertion Date/Time: 08/12/2020 12:57 PM Performed by: Rolla Plate, CRNA Pre-anesthesia Checklist: Patient identified, Patient being monitored, Timeout performed, Emergency Drugs available and Suction available Patient Re-evaluated:Patient Re-evaluated prior to induction Oxygen Delivery Method: Circle system utilized Preoxygenation: Pre-oxygenation with 100% oxygen Induction Type: IV induction Ventilation: Mask ventilation without difficulty LMA: LMA inserted LMA Size: 4.5 Tube type: Oral Number of attempts: 1 Placement Confirmation: positive ETCO2 and breath sounds checked- equal and bilateral Tube secured with: Tape Dental Injury: Teeth and Oropharynx as per pre-operative assessment

## 2020-08-12 NOTE — H&P (Signed)
H&P updated today 08/12/20 No changes in medical history S/p cardiac clearance and vascular Will do ON plavix / asa, understands risks RRR CTAB  OTHON GUARDIA 05/14/1940 [id number]  Referring provider: [name], [place],  [place] [id number]     Chief Complaint  Patient presents with  . Benign Prostatic Hypertrophy    HPI: 80 year old male who initially presented with hematuria found to have a 1.6 cm left distal ureteral calculus (hydronephrosis with a duplicated left collecting system).  In addition to this, he was also found to have a fairly large AAA and in the interim, has undergone endovascular AAA repair with bilateral iliac stents as well with Dr. Delana Meyer.  He returns today to discuss definitive management of his ureteral calculus.  He also has a known personal history of prostamegaly with a prostate volume approximately 63 cc.  He is currently on medical management for this.  Returns today accompanied by his daughter.  He reports that surgery went well.  He is going to be on Plavix for quite some time.  He did have an episode over the weekend in church where he passed out.  He is not notified his cardiologist, Dr. [name] or Dr. Delana Meyer about this.  He is feeling well today.  He does have some baseline urinary symptoms including some lower abdominal discomfort urgency frequency.  His urinalysis is consistent with stone.   PMH:     Past Medical History:  Diagnosis Date  . Aneurysm of infrarenal abdominal aorta (HCC) 05/10/2020   5.9 cm  . Aortic atherosclerosis ([place])   . Aortic valvar stenosis 02/08/2018   Mild to Moderate Noted on ECHO  . Basal cell carcinoma    Face  . Carotid artery stenosis   . CKD (chronic kidney disease), stage III ([place])   . COPD (chronic obstructive pulmonary disease) ([place])   . Coronary artery disease   . DVT (deep venous thrombosis) ([place])    age 23, leg after Tonsillectomy  . Family  history of adverse reaction to anesthesia    mother under anesthesia for extended period for AAA memory problems/dementia after  . Grade I diastolic dysfunction 17/6160  . Heart murmur   . Hyperlipidemia   . Myocardial infarction ([place]) 07/1983  . OA (osteoarthritis)     Surgical History:      Past Surgical History:  Procedure Laterality Date  . ABDOMINAL AORTIC ANEURYSM REPAIR    . CARDIAC CATHETERIZATION  08/14/1983  . CARDIOVASCULAR STRESS TEST  01/23/2011   normal myocardial perfusion scan demonstrating an attenuation artifact in the inferior region of the myocardium. no ischemia or infarct/scar seen in remaining myocardium  . CAROTID DUPLEX  05/21/2006   right bulb-0-49% diameter reduction; left bulb and proximal ICA-0-49% diameter reduction  . CATARACT EXTRACTION, BILATERAL  01/2019  . CORONARY ANGIOPLASTY  07/1983   PTCA Prox RCA  . DOPPLER ECHOCARDIOGRAPHY  07/21/2012   EF 50-55%, aortic valve noncoronary cusp mobility was severely restricted.  . ENDOVASCULAR REPAIR/STENT GRAFT N/A 07/03/2020   Procedure: ENDOVASCULAR REPAIR/STENT GRAFT;  Surgeon: Katha Cabal, MD;  Location: [facility];  Service: Cardiovascular;  Laterality: N/A;  . ROTATOR CUFF REPAIR Left   . TEMPORARY PACEMAKER  07/1983  . TONSILLECTOMY    . TOTAL HIP ARTHROPLASTY Left 02/21/2019   Procedure: TOTAL HIP ARTHROPLASTY ANTERIOR APPROACH;  Surgeon: Paralee Cancel, MD;  Location: [facility];  Service: Orthopedics;  Laterality: Left;  70 mins    Home Medications:  Allergies as of 07/09/2020      Reactions   Quinolones    Other reaction(s): Has aortic root dilatation         Medication List       Accurate as of July 09, 2020 11:40 AM. If you have any questions, ask your nurse or doctor.        aspirin EC 81 MG tablet Take 81 mg by mouth daily. Swallow whole.   clopidogrel 75 MG tablet Commonly known as: PLAVIX Take 1 tablet (75 mg total) by mouth  daily at 6 (six) AM.   loratadine 10 MG tablet Commonly known as: CLARITIN Take by mouth.   multivitamin with minerals Tabs tablet Take 1 tablet by mouth daily.   nicotine polacrilex 2 MG lozenge Commonly known as: COMMIT 4 mg. What changed: Another medication with the same name was removed. Continue taking this medication, and follow the directions you see here. Changed by: Hollice Espy, MD   simvastatin 80 MG tablet Commonly known as: ZOCOR Take 80 mg by mouth daily at 6 PM.       Allergies:       Allergies  Allergen Reactions  . Quinolones     Other reaction(s): Has aortic root dilatation    Family History:      Family History  Problem Relation Age of Onset  . Heart Problems Mother   . AAA (abdominal aortic aneurysm) Mother     Social History:  reports that he has quit smoking. His smoking use included cigarettes. He has a 100.00 pack-year smoking history. He has never used smokeless tobacco. He reports previous alcohol use. He reports that he does not use drugs.   Physical Exam: BP (!) 158/68   Pulse 83   Temp (!) 97.4 F (36.3 C)   Wt 192 lb (87.1 kg)   BMI 26.78 kg/m   Constitutional:  Alert and oriented, No acute distress. HEENT: [place] AT, moist mucus membranes.  Trachea midline, no masses. Cardiovascular: No clubbing, cyanosis, or edema. Respiratory: Normal respiratory effort, no increased work of breathing. Skin: No rashes, bruises or suspicious lesions. Neurologic: Grossly intact, no focal deficits, moving all 4 extremities. Psychiatric: Normal mood and affect.  Laboratory Data: Recent Labs       Lab Results  Component Value Date   WBC 16.8 (H) 07/04/2020   HGB 11.7 (L) 07/04/2020   HCT 34.9 (L) 07/04/2020   MCV 95.1 07/04/2020   PLT 234 07/04/2020      Recent Labs       Lab Results  Component Value Date   CREATININE 1.28 (H) 07/04/2020     Urinalysis Urinalysis today with greater than 30 red blood  cells, 6-10 white blood cells, no bacteria, nitratei negative.  Pertinent Imaging: CT HEMATURIA WORKUP  Narrative CLINICAL DATA:  Hematuria.  EXAM: CT ABDOMEN AND PELVIS WITHOUT AND WITH CONTRAST  TECHNIQUE: Multidetector CT imaging of the abdomen and pelvis was performed following the standard protocol before and following the bolus administration of intravenous contrast.  CONTRAST:  171mL OMNIPAQUE IOHEXOL 300 MG/ML  SOLN  COMPARISON:  None.  FINDINGS: Lower chest: Emphysematous changes noted within the lung bases. No acute abnormality.  Hepatobiliary: No focal liver abnormality is seen. No gallstones, gallbladder wall thickening, no focal liver abnormality. Tiny calcified gallstone noted within the fundus. No gallbladder wall inflammation or pericholecystic fluid. No biliary ductal dilatation.  Pancreas: Unremarkable. No pancreatic ductal dilatation or surrounding inflammatory changes.  Spleen: Normal in size without focal abnormality.  Adrenals/Urinary Tract: Normal appearance of the adrenal glands. Bilateral renal vascular calcifications are identified. No kidney stones identified. No hydronephrosis identified bilaterally. Mild distal left hydroureter. Calcification at the left UVJ measures 1.6 cm, image 73/7. Two small left kidney cysts are identified. The largest is in the anterior cortex of the left mid kidney measuring 1.5 cm.  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  Stomach/Bowel: Stomach is nondistended. Duodenal diverticulum is identified arising off the medial wall of the descending duodenum measuring 3.8 x 2.6 cm. The appendix is visualized and appears normal. There is no bowel wall thickening, inflammation, or distension. Extensive distal colonic diverticulosis identified without acute inflammation.  Vascular/Lymphatic: Infrarenal abdominal aortic aneurysm measures 5.9 cm in  maximum AP dimension. Aneurysmal dilatation of bilateral common iliac arteries. The left common iliac artery measures 3.4 cm. The right common iliac artery measures 4 cm. No abdominopelvic adenopathy.  Reproductive: Mild prostate gland enlargement has mass effect upon the bladder base.  Other: No ascites or focal fluid collections.  Musculoskeletal: Previous left hip arthroplasty. Multilevel degenerative disc disease noted within the thoracolumbar spine.  IMPRESSION: 1. Urinary tract calculi at the left UVJ measures 1.6 cm and results in mild distal left hydroureter. 2. 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 3. Infrarenal abdominal aortic aneurysm measures 5.9 cm in maximum AP dimension. Recommend referral to a vascular specialist. This recommendation follows ACR consensus guidelines: White Paper of the ACR Incidental Findings Committee II on Vascular Findings. J Am Coll Radiol 2013; 10:789-794. 4. Gallstone. 5. Aortic Atherosclerosis (ICD10-I70.0) and Emphysema (ICD10-J43.9).  These results will be called to the ordering clinician or representative by the Radiologist Assistant, and communication documented in the PACS or [employer].   Electronically Signed By: Kerby Moors M.D. On: 05/10/2020 17:01  Assessment & Plan:    1. Left ureteral calculus 1.6 cm left distal ureteral calculus, partially obstructing  Now that his AAA has been repaired, we discussed definitive management of his stone today.  Given his recent endovascular repair, likely need to remain on Plavix.  As such, would more strongly recommend ureteroscopy.  Risks and benefits of ureteroscopy were reviewed including but not limited to infection, bleeding, pain, ureteral injury which could require open surgery versus prolonged indwelling if ureteral perforation occurs, persistent stone disease, requirement for staged procedure, possible stent, and  global anesthesia risks. Patient expressed understanding and desires to proceed with ureteroscopy.  He will need clearance from cardiology as well as vascular surgery.  Okay to remain on Plavix, we had a lengthy risk benefits discussion about this today.  There is a risk of bleeding and poor visualization although this is likely manageable.  As such, we will plan to continue Plavix intraoperatively.  2. Benign prostatic hyperplasia with urinary obstruction Previously had mention interest for UroLift, will plan to address the stone, complete cystoscopy and evaluate the prostatic anatomy at the time of the above procedure but hold off on UroLift for the time being - Urinalysis, Complete - BLADDER SCAN AMB NON-IMAGING - CULTURE, URINE COMPREHENSIVE  3. Microscopic hematuria Cystoscopy to complete hematuria work-up intraoperatively    Hollice Espy, MD  [facility] [address] [place], [place] [address] [phone]

## 2020-08-12 NOTE — Transfer of Care (Signed)
Immediate Anesthesia Transfer of Care Note  Patient: Garrett Moore  Procedure(s) Performed: CYSTOSCOPY/URETEROSCOPY/HOLMIUM LASER/STENT PLACEMENT (Left )  Patient Location: PACU  Anesthesia Type:General  Level of Consciousness: drowsy  Airway & Oxygen Therapy: Patient Spontanous Breathing and Patient connected to face mask oxygen  Post-op Assessment: Report given to RN and Post -op Vital signs reviewed and stable  Post vital signs: Reviewed and stable  Last Vitals:  Vitals Value Taken Time  BP 140/76 08/12/20 1452  Temp    Pulse 60 08/12/20 1456  Resp 10 08/12/20 1456  SpO2 100 % 08/12/20 1456  Vitals shown include unvalidated device data.  Last Pain:  Vitals:   08/12/20 1152  TempSrc:   PainSc: 0-No pain         Complications: No complications documented.

## 2020-08-12 NOTE — Anesthesia Postprocedure Evaluation (Signed)
Anesthesia Post Note  Patient: Garrett Moore  Procedure(s) Performed: CYSTOSCOPY/URETEROSCOPY/HOLMIUM LASER/STENT PLACEMENT (Left ) CYSTOSCOPY WITH RETROGRADE PYELOGRAM  Patient location during evaluation: PACU Anesthesia Type: General Level of consciousness: awake and alert, awake and oriented Pain management: pain level controlled Vital Signs Assessment: post-procedure vital signs reviewed and stable Respiratory status: spontaneous breathing, nonlabored ventilation and respiratory function stable Cardiovascular status: blood pressure returned to baseline and stable Postop Assessment: no apparent nausea or vomiting Anesthetic complications: no   No complications documented.   Last Vitals:  Vitals:   08/12/20 1531 08/12/20 1544  BP: (!) 157/66 (!) 157/70  Pulse: 68 67  Resp: 17 18  Temp:  (!) 35.8 C  SpO2: 100% 100%    Last Pain:  Vitals:   08/12/20 1544  TempSrc: Temporal  PainSc: 0-No pain                 Phill Mutter

## 2020-08-12 NOTE — Anesthesia Preprocedure Evaluation (Addendum)
Anesthesia Evaluation  Patient identified by MRN, date of birth, ID band Patient awake    Reviewed: Allergy & Precautions, H&P , NPO status , Patient's Chart, lab work & pertinent test results  History of Anesthesia Complications (+) Family history of anesthesia reactionNegative for: history of anesthetic complications  Airway Mallampati: II  TM Distance: >3 FB     Dental  (+) Edentulous Lower, Edentulous Upper   Pulmonary neg sleep apnea, COPD, former smoker,    breath sounds clear to auscultation       Cardiovascular (-) angina+ CAD, + Past MI and + Peripheral Vascular Disease  (-) Cardiac Stents (-) dysrhythmias + Valvular Problems/Murmurs AS  Rhythm:regular Rate:Normal  AAA unruptured HFpEF, Grade 1 diastolic dysfunction Moderate AS   Neuro/Psych negative neurological ROS  negative psych ROS   GI/Hepatic negative GI ROS, Neg liver ROS,   Endo/Other  negative endocrine ROS  Renal/GU Renal disease (CKD)     Musculoskeletal  (+) Arthritis , Osteoarthritis,    Abdominal   Peds  Hematology negative hematology ROS (+)   Anesthesia Other Findings   Aneurysm of infrarenal abdominal aorta (HCC)     Comment:  5.9 cm  Aortic atherosclerosis (HCC) Aortic valvar stenosis  Mild to Moderate Noted on ECHO  Basal cell carcinoma  Carotid artery stenosis  CKD (chronic kidney disease), stage III (HCC)  COPD (chronic obstructive pulmonary disease) (HCC) Coronary artery disease DVT (deep venous thrombosis) (HCC)     Comment:  age 80, leg after Tonsillectomy No date: Family history of adverse reaction to anesthesia     Comment:  mother under anesthesia for extended period for AAA               memory problems/dementia after Grade I diastolic dysfunction  Heart murmur Hyperlipidemia 07/1983: Myocardial infarction ([place]) No date: OA (osteoarthritis)  Past Surgical History: 08/14/1983: CARDIAC CATHETERIZATION 01/23/2011:  CARDIOVASCULAR STRESS TEST     Comment:  normal myocardial perfusion scan demonstrating an               attenuation artifact in the inferior region of the               myocardium. no ischemia or infarct/scar seen in remaining              myocardium 05/21/2006: CAROTID DUPLEX     Comment:  right bulb-0-49% diameter reduction; left bulb and               proximal ICA-0-49% diameter reduction 01/2019: CATARACT EXTRACTION, BILATERAL 07/1983: CORONARY ANGIOPLASTY     Comment:  PTCA Prox RCA 07/21/2012: DOPPLER ECHOCARDIOGRAPHY     Comment:  EF 50-55%, aortic valve noncoronary cusp mobility was               severely restricted. No date: ROTATOR CUFF REPAIR; Left 07/1983: TEMPORARY PACEMAKER No date: TONSILLECTOMY 02/21/2019: TOTAL HIP ARTHROPLASTY; Left     Comment:  Procedure: TOTAL HIP ARTHROPLASTY ANTERIOR APPROACH;                Surgeon: Paralee Cancel, MD;  Location: [facility];  Service:               Orthopedics;  Laterality: Left;  70 mins     Reproductive/Obstetrics negative OB ROS                             Anesthesia Physical  Anesthesia Plan  ASA: III  Anesthesia Plan: General   Post-op Pain Management:    Induction: Intravenous  PONV Risk Score and Plan: Ondansetron, Dexamethasone and Treatment may vary due to age or medical condition  Airway Management Planned: Oral ETT and LMA  Additional Equipment:   Intra-op Plan:   Post-operative Plan: Extubation in OR  Informed Consent: I have reviewed the patients History and Physical, chart, labs and discussed the procedure including the risks, benefits and alternatives for the proposed anesthesia with the patient or authorized representative who has indicated his/her understanding and acceptance.     Dental Advisory Given  Plan Discussed with: Anesthesiologist, CRNA and Surgeon  Anesthesia Plan Comments:      Anesthesia Quick Evaluation

## 2020-08-13 ENCOUNTER — Telehealth: Payer: Self-pay | Admitting: *Deleted

## 2020-08-13 ENCOUNTER — Encounter: Payer: Self-pay | Admitting: Urology

## 2020-08-13 NOTE — Telephone Encounter (Signed)
It is normal to have some degree of bleeding with the stent in place for anyone whether they are on blood thinners or not.    Aspirin and Plavix sometimes can make this worse.  He definitely should stay on his aspirin at minimum but if the bleeding becomes more severe, he starts having thick bloody urine or larger clots or difficulty voiding, hold the Plavix.  If he is uncertain, he can always be scheduled to see Sam or Larene Beach to take a look at his urine as assess in person.   Hollice Espy, MD

## 2020-08-13 NOTE — Telephone Encounter (Signed)
Informed patient, will start back on Aspirin and hold Plavix. Will see if bleeding improves then restart Plavix. Aware if symptoms worsen or bleeding worsens to call the office and seek medical attention. Offered appointment-declined.

## 2020-08-13 NOTE — Telephone Encounter (Signed)
Patient called in today and states he is still bleeding and has notice a couple of small clots. He states he is not having any burning with urination . He wanted to know when to start taking his aspirin and plavix. Advised him to drink lots of water and some bleed is normal after the stent placement.  No fever.

## 2020-08-14 ENCOUNTER — Emergency Department
Admission: EM | Admit: 2020-08-14 | Discharge: 2020-08-14 | Disposition: A | Discharge status: Home / Self Care | Payer: Medicare HMO | Attending: Emergency Medicine | Admitting: Emergency Medicine

## 2020-08-14 ENCOUNTER — Emergency Department: Payer: Medicare HMO

## 2020-08-14 ENCOUNTER — Other Ambulatory Visit: Payer: Self-pay

## 2020-08-14 DIAGNOSIS — J449 Chronic obstructive pulmonary disease, unspecified: Secondary | ICD-10-CM | POA: Diagnosis not present

## 2020-08-14 DIAGNOSIS — R112 Nausea with vomiting, unspecified: Secondary | ICD-10-CM | POA: Diagnosis not present

## 2020-08-14 DIAGNOSIS — Z85828 Personal history of other malignant neoplasm of skin: Secondary | ICD-10-CM | POA: Diagnosis not present

## 2020-08-14 DIAGNOSIS — D72829 Elevated white blood cell count, unspecified: Secondary | ICD-10-CM | POA: Diagnosis not present

## 2020-08-14 DIAGNOSIS — K5900 Constipation, unspecified: Secondary | ICD-10-CM | POA: Insufficient documentation

## 2020-08-14 DIAGNOSIS — N183 Chronic kidney disease, stage 3 unspecified: Secondary | ICD-10-CM | POA: Insufficient documentation

## 2020-08-14 DIAGNOSIS — Z87891 Personal history of nicotine dependence: Secondary | ICD-10-CM | POA: Insufficient documentation

## 2020-08-14 DIAGNOSIS — R319 Hematuria, unspecified: Secondary | ICD-10-CM | POA: Diagnosis not present

## 2020-08-14 DIAGNOSIS — N400 Enlarged prostate without lower urinary tract symptoms: Secondary | ICD-10-CM | POA: Diagnosis not present

## 2020-08-14 DIAGNOSIS — I2510 Atherosclerotic heart disease of native coronary artery without angina pectoris: Secondary | ICD-10-CM | POA: Diagnosis not present

## 2020-08-14 DIAGNOSIS — Z96652 Presence of left artificial knee joint: Secondary | ICD-10-CM | POA: Diagnosis not present

## 2020-08-14 DIAGNOSIS — R339 Retention of urine, unspecified: Secondary | ICD-10-CM | POA: Diagnosis not present

## 2020-08-14 DIAGNOSIS — Z7982 Long term (current) use of aspirin: Secondary | ICD-10-CM | POA: Diagnosis not present

## 2020-08-14 DIAGNOSIS — N210 Calculus in bladder: Secondary | ICD-10-CM | POA: Diagnosis not present

## 2020-08-14 DIAGNOSIS — Z7902 Long term (current) use of antithrombotics/antiplatelets: Secondary | ICD-10-CM | POA: Diagnosis not present

## 2020-08-14 DIAGNOSIS — N2889 Other specified disorders of kidney and ureter: Secondary | ICD-10-CM | POA: Diagnosis not present

## 2020-08-14 LAB — CBC
HCT: 32.9 % — ABNORMAL LOW (ref 39.0–52.0)
Hemoglobin: 11 g/dL — ABNORMAL LOW (ref 13.0–17.0)
MCH: 31.6 pg (ref 26.0–34.0)
MCHC: 33.4 g/dL (ref 30.0–36.0)
MCV: 94.5 fL (ref 80.0–100.0)
Platelets: 329 10*3/uL (ref 150–400)
RBC: 3.48 MIL/uL — ABNORMAL LOW (ref 4.22–5.81)
RDW: 13.1 % (ref 11.5–15.5)
WBC: 15.2 10*3/uL — ABNORMAL HIGH (ref 4.0–10.5)
nRBC: 0 % (ref 0.0–0.2)

## 2020-08-14 LAB — COMPREHENSIVE METABOLIC PANEL
ALT: 13 U/L (ref 0–44)
AST: 31 U/L (ref 15–41)
Albumin: 3.7 g/dL (ref 3.5–5.0)
Alkaline Phosphatase: 67 U/L (ref 38–126)
Anion gap: 10 (ref 5–15)
BUN: 17 mg/dL (ref 8–23)
CO2: 23 mmol/L (ref 22–32)
Calcium: 8.5 mg/dL — ABNORMAL LOW (ref 8.9–10.3)
Chloride: 100 mmol/L (ref 98–111)
Creatinine, Ser: 1.34 mg/dL — ABNORMAL HIGH (ref 0.61–1.24)
GFR, Estimated: 54 mL/min — ABNORMAL LOW (ref >60)
Glucose, Bld: 158 mg/dL — ABNORMAL HIGH (ref 70–99)
Potassium: 3.8 mmol/L (ref 3.5–5.1)
Sodium: 133 mmol/L — ABNORMAL LOW (ref 135–145)
Total Bilirubin: 0.9 mg/dL (ref 0.3–1.2)
Total Protein: 7.2 g/dL (ref 6.5–8.1)

## 2020-08-14 LAB — URINALYSIS, COMPLETE (UACMP) WITH MICROSCOPIC
Bilirubin Urine: NEGATIVE
Glucose, UA: 50 mg/dL — AB
Ketones, ur: NEGATIVE mg/dL
Nitrite: NEGATIVE
Protein, ur: 100 mg/dL — AB
RBC / HPF: >50 RBC/hpf — ABNORMAL HIGH (ref 0–5)
Specific Gravity, Urine: 1.016 (ref 1.005–1.030)
Squamous Epithelial / HPF: NONE SEEN (ref 0–5)
pH: 6 (ref 5.0–8.0)

## 2020-08-14 LAB — LIPASE, BLOOD: Lipase: 25 U/L (ref 11–51)

## 2020-08-14 NOTE — ED Triage Notes (Signed)
Patient had uteral stent placed last Friday.  Arrives today c/o vomiting and hematuria since Sunday.  States has not voided at all today.

## 2020-08-14 NOTE — ED Provider Notes (Signed)
[facility] Emergency Department Provider Note ____________________________________________   Event Date/Time   First MD Initiated Contact with Patient 08/14/20 1824     (approximate)  I have reviewed the triage vital signs and the nursing notes.   HISTORY  Chief Complaint Urinary Retention and Emesis    HPI Garrett Moore is a 80 y.o. male with PMH as noted below including BPH and chronic kidney disease status post laser lithotripsy and stent placement for a large UVJ stone 2 days ago who presents with urinary retention since early this morning, preceded by some hematuria.  He reports suprapubic abdominal pain as well as some nausea but no vomiting.  He has been constipated as well.  He denies any fever or chills.  Past Medical History:  Diagnosis Date  . Aneurysm of infrarenal abdominal aorta ([place]) 05/10/2020   s/p EVAR 07/03/2020  . Aortic atherosclerosis ([place])   . Aortic valvar stenosis 02/08/2018   Moderate  . Atrial fibrillation ([place])   . Basal cell carcinoma    Face  . BPH (benign prostatic hyperplasia)   . Carotid artery stenosis   . CKD (chronic kidney disease), stage III ([place])   . COPD (chronic obstructive pulmonary disease) ([place])   . Coronary artery disease   . DVT (deep venous thrombosis) ([place])    age 7, leg after Tonsillectomy  . Family history of adverse reaction to anesthesia    mother under anesthesia for extended period for AAA memory problems/dementia after  . Grade I diastolic dysfunction 42/7062  . Heart murmur   . History of kidney stones   . Hyperlipidemia   . Myocardial infarction ([place]) 07/1983   IABP overnight then removed and nothing else.  . OA (osteoarthritis)   . Peripheral vascular disease ([place])   . Presence of arterial stent 07/03/2020   BILATERAL iliac arteries  . Ureteral calculus, left 07/2020    Patient Active Problem List   Diagnosis Date Noted  . Aortic valve disease 08/05/2020  . Atherosclerotic  heart disease of native coronary artery without angina pectoris 08/05/2020  . Basal cell carcinoma of skin 08/05/2020  . Benign prostatic hyperplasia 08/05/2020  . Chronic kidney disease, stage 3 unspecified ([place]) 08/05/2020  . Chronic obstructive pulmonary disease, unspecified ([place]) 08/05/2020  . Frequent fecal incontinence 08/05/2020  . Glaucoma 08/05/2020  . Mixed hyperlipidemia 08/05/2020  . Other long term (current) drug therapy 08/05/2020  . AAA (abdominal aortic aneurysm) ([place]) 07/03/2020  . AAA (abdominal aortic aneurysm) without rupture ([place]) 05/26/2020  . S/P left THA, AA 02/21/2019  . Status post total hip replacement, left 02/21/2019    Past Surgical History:  Procedure Laterality Date  . ABDOMINAL AORTIC ANEURYSM REPAIR    . CARDIAC CATHETERIZATION  08/14/1983  . CARDIOVASCULAR STRESS TEST  01/23/2011   normal myocardial perfusion scan demonstrating an attenuation artifact in the inferior region of the myocardium. no ischemia or infarct/scar seen in remaining myocardium  . CAROTID DUPLEX  05/21/2006   right bulb-0-49% diameter reduction; left bulb and proximal ICA-0-49% diameter reduction  . CATARACT EXTRACTION, BILATERAL  01/2019  . CORONARY ANGIOPLASTY  07/1983   PTCA Prox RCA  . CYSTOSCOPY W/ RETROGRADES  08/12/2020   Procedure: CYSTOSCOPY WITH RETROGRADE PYELOGRAM;  Surgeon: Hollice Espy, MD;  Location: [facility] ORS;  Service: Urology;;  . CYSTOSCOPY/URETEROSCOPY/HOLMIUM LASER/STENT PLACEMENT Left 08/12/2020   Procedure: CYSTOSCOPY/URETEROSCOPY/HOLMIUM LASER/STENT PLACEMENT;  Surgeon: Hollice Espy, MD;  Location: [facility] ORS;  Service: Urology;  Laterality: Left;  . DOPPLER ECHOCARDIOGRAPHY  07/21/2012   EF 50-55%, aortic valve noncoronary cusp mobility was severely restricted.  . ENDOVASCULAR REPAIR/STENT GRAFT N/A 07/03/2020   Procedure: ENDOVASCULAR REPAIR/STENT GRAFT;  Surgeon: Katha Cabal, MD;  Location: [facility];  Service: Cardiovascular;  Laterality:  N/A;  . EYE SURGERY Bilateral    cataracts  . ROTATOR CUFF REPAIR Left   . TEMPORARY PACEMAKER  07/1983  . TONSILLECTOMY    . TOTAL HIP ARTHROPLASTY Left 02/21/2019   Procedure: TOTAL HIP ARTHROPLASTY ANTERIOR APPROACH;  Surgeon: Paralee Cancel, MD;  Location: [facility];  Service: Orthopedics;  Laterality: Left;  70 mins  . VITRECTOMY Bilateral     Prior to Admission medications   Medication Sig Start Date End Date Taking? Authorizing Provider  acetaminophen (TYLENOL) 500 MG tablet Take 500 mg by mouth every 6 (six) hours as needed.    [provider]  aspirin EC 81 MG tablet Take 81 mg by mouth daily. Swallow whole.    [provider]  clopidogrel (PLAVIX) 75 MG tablet Take 1 tablet (75 mg total) by mouth daily at 6 (six) AM. 07/05/20   Stegmayer, Janalyn Harder, PA-C  HYDROcodone-acetaminophen (NORCO/VICODIN) 5-325 MG tablet Take 1-2 tablets by mouth every 6 (six) hours as needed for moderate pain. 08/12/20   Hollice Espy, MD  Multiple Vitamin (MULTIVITAMIN WITH MINERALS) TABS tablet Take 1 tablet by mouth daily.    [provider]  nicotine polacrilex (COMMIT) 2 MG lozenge Take 2 mg by mouth as needed for smoking cessation.    [provider]  oxybutynin (DITROPAN) 5 MG tablet Take 1 tablet (5 mg total) by mouth every 8 (eight) hours as needed for bladder spasms. 08/12/20   Hollice Espy, MD  simvastatin (ZOCOR) 80 MG tablet Take 80 mg by mouth daily at 6 PM.     [provider]  tamsulosin (FLOMAX) 0.4 MG CAPS capsule Take 1 capsule (0.4 mg total) by mouth daily. 08/12/20   Hollice Espy, MD    Allergies Quinolones  Family History  Problem Relation Age of Onset  . Heart Problems Mother   . AAA (abdominal aortic aneurysm) Mother     Social History Social History   Tobacco Use  . Smoking status: Former Smoker    Packs/day: 2.00    Years: 50.00    Pack years: 100.00    Types: Cigarettes  . Smokeless tobacco: Never Used  . Tobacco  comment: quit 14 years ago  Vaping Use  . Vaping Use: Never used  Substance Use Topics  . Alcohol use: Not Currently  . Drug use: Never    Review of Systems  Constitutional: No fever/chills. Eyes: No visual changes. ENT: No sore throat. Cardiovascular: Denies chest pain. Respiratory: Denies shortness of breath. Gastrointestinal: Positive for nausea. Genitourinary: Positive for urinary retention. Musculoskeletal: Negative for back pain. Skin: Negative for rash. Neurological: Negative for headache.   ____________________________________________   PHYSICAL EXAM:  VITAL SIGNS: ED Triage Vitals  Enc Vitals Group     BP 08/14/20 1808 (!) 154/116     Pulse Rate 08/14/20 1808 92     Resp 08/14/20 1808 20     Temp 08/14/20 1808 98.2 F (36.8 C)     Temp Source 08/14/20 1808 Oral     SpO2 08/14/20 1808 98 %     Weight 08/14/20 1809 185 lb (83.9 kg)     Height 08/14/20 1809 5\' 11"  (1.803 m)     Head Circumference --      Peak  Flow --      Pain Score 08/14/20 1809 8     Pain Loc --      Pain Edu? --      Excl. in [place]? --     Constitutional: Alert and oriented.  Relatively well appearing, in no acute distress. Eyes: Conjunctivae are normal.  Head: Atraumatic. Nose: No congestion/rhinnorhea. Mouth/Throat: Mucous membranes are moist.   Neck: Normal range of motion.  Cardiovascular: Normal rate, regular rhythm.  Good peripheral circulation. Respiratory: Normal respiratory effort.  No retractions. Gastrointestinal: Soft and nontender. No distention.  Genitourinary: No flank tenderness. Musculoskeletal: No lower extremity edema.  Extremities warm and well perfused.  Neurologic:  Normal speech and language. No gross focal neurologic deficits are appreciated.  Skin:  Skin is warm and dry. No rash noted. Psychiatric: Mood and affect are normal. Speech and behavior are normal.  ____________________________________________   LABS (all labs ordered are listed, but only  abnormal results are displayed)  Labs Reviewed  COMPREHENSIVE METABOLIC PANEL - Abnormal; Notable for the following components:      Result Value   Sodium 133 (*)    Glucose, Bld 158 (*)    Creatinine, Ser 1.34 (*)    Calcium 8.5 (*)    GFR, Estimated 54 (*)    All other components within normal limits  CBC - Abnormal; Notable for the following components:   WBC 15.2 (*)    RBC 3.48 (*)    Hemoglobin 11.0 (*)    HCT 32.9 (*)    All other components within normal limits  URINALYSIS, COMPLETE (UACMP) WITH MICROSCOPIC - Abnormal; Notable for the following components:   Color, Urine AMBER (*)    APPearance CLOUDY (*)    Glucose, UA 50 (*)    Hgb urine dipstick MODERATE (*)    Protein, ur 100 (*)    Leukocytes,Ua TRACE (*)    RBC / HPF >50 (*)    Bacteria, UA RARE (*)    All other components within normal limits  LIPASE, BLOOD   ____________________________________________  EKG   ____________________________________________  RADIOLOGY  CT abdomen/pelvis: Diverticulosis with no evidence of diverticulitis.  Break-up of previously seen UVJ stone with stent in place.  ____________________________________________   PROCEDURES  Procedure(s) performed: No  Procedures  Critical Care performed: No ____________________________________________   INITIAL IMPRESSION / ASSESSMENT AND PLAN / ED COURSE  Pertinent labs & imaging results that were available during my care of the patient were reviewed by me and considered in my medical decision making (see chart for details).  80 year old male status post recent lithotripsy and ureteral stent 2 days ago for a large UVJ stone presents with urinary retention, acute onset this morning.  It is associated with suprapubic abdominal pain, however the patient has also had some nausea and constipation over the last few days.  He is also status post a recent AAA repair a few weeks ago.  On exam, the patient is overall well-appearing.  He  appeared much more comfortable after having a Foley catheter placed and states his pain has now resolved.  The abdomen is soft with no focal tenderness.  Foley is draining pinkish colored urine.  Overall I suspect most likely urinary retention related to the recent urologic procedures.  It does not appear that the patient is passing significant amounts of blood or clots.  I also do not suspect UTI.  Given that the patient has complete resolution of his pain after the urinary retention was resolved, I do  not suspect AAA rupture, endoleak or other complication of his aortic aneurysm repair.  We will obtain a urinalysis, basic labs, CT abdomen, and reassess.  ----------------------------------------- 9:08 PM on 08/14/2020 -----------------------------------------  The Foley has continued to drain dark pink but clear urine with no visible clots.  The patient continues to feel well and wants to go home.  Lab work-up is overall reassuring.  The patient's creatinine is at baseline.  He has leukocytosis but this appears to be trending downward, and he is not anemic compared to baseline.  Urinalysis shows primarily RBCs but no nitrites and rare bacteria, consistent with the recent stones.  There is no clinical evidence for UTI/cystitis.  CT shows no concerning acute abnormalities.    I discussed the case with Dr. Erlene Quan from urology who agrees with the management so far and recommends that the patient can be sent home with the Foley in place.  He has follow-up arranged with her next week.  The patient is eager to be discharged.  I counseled him on the results of the work-up and on keeping the Foley in place with a leg bag.  I gave him and his family member thorough return precautions and they expressed understanding.  ____________________________________________   FINAL CLINICAL IMPRESSION(S) / ED DIAGNOSES  Final diagnoses:  Urinary retention  Constipation, unspecified constipation type       NEW MEDICATIONS STARTED DURING THIS VISIT:  Discharge Medication List as of 08/14/2020  8:04 PM       Note:  This document was prepared using Dragon voice recognition software and may include unintentional dictation errors.    Arta Silence, MD 08/14/20 2116

## 2020-08-14 NOTE — ED Notes (Signed)
1st IV attempt unsuccessful

## 2020-08-14 NOTE — ED Triage Notes (Signed)
Pt to ED POV for emesis and urinary retention. Had stent placed on Monday. Reports emesis after eating since Sunday. States unsure if has urinated today.  624 mL in bladder scan

## 2020-08-14 NOTE — Telephone Encounter (Addendum)
.  left message to have patient return my call.  Pt called and still having problems, left message to offer pt an appt.

## 2020-08-14 NOTE — Discharge Instructions (Addendum)
Follow-up with Dr. Erlene Quan next week as planned.  We would recommend MiraLAX powder for constipation.  Return to the ER for new, worsening, or persistent urinary retention, nothing draining into the catheter bag, abdominal pain, vomiting, blood in the stool, increasing blood or clots in the urine, fever or chills, flank pain, or any other new or worsening symptoms that concern you.

## 2020-08-16 LAB — CALCULI, WITH PHOTOGRAPH (CLINICAL LAB)
Calcium Oxalate Monohydrate: 100 %
Weight Calculi: 25 mg

## 2020-08-21 ENCOUNTER — Other Ambulatory Visit: Payer: Self-pay

## 2020-08-21 ENCOUNTER — Ambulatory Visit (INDEPENDENT_AMBULATORY_CARE_PROVIDER_SITE_OTHER): Payer: Medicare HMO | Admitting: Urology

## 2020-08-21 VITALS — BP 130/55 | HR 86

## 2020-08-21 DIAGNOSIS — R339 Retention of urine, unspecified: Secondary | ICD-10-CM

## 2020-08-21 DIAGNOSIS — N201 Calculus of ureter: Secondary | ICD-10-CM | POA: Diagnosis not present

## 2020-08-21 MED ORDER — CEFTRIAXONE SODIUM 1 G IJ SOLR
1.0000 g | INTRAMUSCULAR | Status: DC
  Administered 2020-08-21: 1 g via INTRAMUSCULAR

## 2020-08-21 NOTE — Progress Notes (Signed)
   08/21/20  CC:  Chief Complaint  Patient presents with  . Cysto    HPI: 80 year old male with chronic obstructing left distal ureteral calculus and a duplicated system status post ureteroscopy who returns today for stent removal.  Please see previous notes for details.  Unfortunately, he developed urinary retention and was seen in the emergency room on May 25.  A Foley catheter was placed which remains in place today.  He is anxious to have this removed today.  He has not been tolerating it particularly well.  No fevers or chills.  1 g of IM ceftriaxone was administered today.  Procedurally in light of voiding trial and stent removal.  Blood pressure (!) 130/55, pulse 86. NED. A&Ox3.   No respiratory distress   Abd soft, NT, ND Normal phallus with bilateral descended testicles  Cystoscopy/ Stent removal procedure  Patient identification was confirmed, informed consent was obtained, and patient was prepped using Betadine solution.  Lidocaine jelly was administered per urethral meatus.    Preoperative abx where received prior to procedure.    Procedure: - Flexible cystoscope introduced, without any difficulty.   - Thorough search of the bladder revealed:    normal urethral meatus  Stent seen emanating from left ureteral orifice, grasped with stent graspers, and removed in entirety.     Moderate debris in bladder limiting visualization.  Post-Procedure: - Patient tolerated the procedure well   Assessment/ Plan:  1. Left ureteral calculus Status post uncomplicated stent removal today  Warning symptoms reviewed  11 follow-up in 4 weeks with renal ultrasound prior - cefTRIAXone (ROCEPHIN) injection 1 g - Ultrasound renal complete; Future  2. Urinary retention Postoperative urinary retention likely in the setting of GU manipulation, medications amongst others  Foley catheter was removed after bladder was filled at cystoscopy today and he was able to  void  Very strict warning symptoms reviewed today for recurrent urinary retention.  Advised to present to our clinic by 3 PM today if he has any difficulty voiding whatsoever.  We will recheck a PVR/IPSS at his follow-up.   Hollice Espy, MD

## 2020-08-21 NOTE — Progress Notes (Signed)
ulCatheter Removal  Patient is present today for a catheter removal.  95ml of water was drained from the balloon. A 16FR foley cath was removed from the bladder no complications were noted . Patient tolerated well.  Performed by: Kerman Passey, RMA

## 2020-08-28 ENCOUNTER — Other Ambulatory Visit: Payer: Self-pay

## 2020-08-28 ENCOUNTER — Emergency Department
Admission: EM | Admit: 2020-08-28 | Discharge: 2020-08-28 | Disposition: A | Discharge status: Home / Self Care | Payer: Medicare HMO | Attending: Emergency Medicine | Admitting: Emergency Medicine

## 2020-08-28 DIAGNOSIS — D72829 Elevated white blood cell count, unspecified: Secondary | ICD-10-CM | POA: Diagnosis not present

## 2020-08-28 DIAGNOSIS — N390 Urinary tract infection, site not specified: Secondary | ICD-10-CM | POA: Insufficient documentation

## 2020-08-28 DIAGNOSIS — Z85828 Personal history of other malignant neoplasm of skin: Secondary | ICD-10-CM | POA: Insufficient documentation

## 2020-08-28 DIAGNOSIS — N183 Chronic kidney disease, stage 3 unspecified: Secondary | ICD-10-CM | POA: Diagnosis not present

## 2020-08-28 DIAGNOSIS — I959 Hypotension, unspecified: Secondary | ICD-10-CM | POA: Diagnosis not present

## 2020-08-28 DIAGNOSIS — Z7902 Long term (current) use of antithrombotics/antiplatelets: Secondary | ICD-10-CM | POA: Diagnosis not present

## 2020-08-28 DIAGNOSIS — I2510 Atherosclerotic heart disease of native coronary artery without angina pectoris: Secondary | ICD-10-CM | POA: Diagnosis not present

## 2020-08-28 DIAGNOSIS — Z96642 Presence of left artificial hip joint: Secondary | ICD-10-CM | POA: Insufficient documentation

## 2020-08-28 DIAGNOSIS — J449 Chronic obstructive pulmonary disease, unspecified: Secondary | ICD-10-CM | POA: Insufficient documentation

## 2020-08-28 DIAGNOSIS — Z79899 Other long term (current) drug therapy: Secondary | ICD-10-CM | POA: Diagnosis not present

## 2020-08-28 DIAGNOSIS — Z87891 Personal history of nicotine dependence: Secondary | ICD-10-CM | POA: Insufficient documentation

## 2020-08-28 DIAGNOSIS — Z7982 Long term (current) use of aspirin: Secondary | ICD-10-CM | POA: Insufficient documentation

## 2020-08-28 DIAGNOSIS — R55 Syncope and collapse: Secondary | ICD-10-CM | POA: Insufficient documentation

## 2020-08-28 DIAGNOSIS — R42 Dizziness and giddiness: Secondary | ICD-10-CM | POA: Diagnosis not present

## 2020-08-28 DIAGNOSIS — E860 Dehydration: Secondary | ICD-10-CM | POA: Insufficient documentation

## 2020-08-28 LAB — URINALYSIS, COMPLETE (UACMP) WITH MICROSCOPIC
Bilirubin Urine: NEGATIVE
Glucose, UA: NEGATIVE mg/dL
Ketones, ur: NEGATIVE mg/dL
Nitrite: POSITIVE — AB
Protein, ur: 100 mg/dL — AB
Specific Gravity, Urine: 1.015 (ref 1.005–1.030)
WBC, UA: >50 WBC/hpf — ABNORMAL HIGH (ref 0–5)
pH: 5 (ref 5.0–8.0)

## 2020-08-28 LAB — CBC WITH DIFFERENTIAL/PLATELET
Abs Immature Granulocytes: 0.16 10*3/uL — ABNORMAL HIGH (ref 0.00–0.07)
Basophils Absolute: 0.1 10*3/uL (ref 0.0–0.1)
Basophils Relative: 0 %
Eosinophils Absolute: 0 10*3/uL (ref 0.0–0.5)
Eosinophils Relative: 0 %
HCT: 33.5 % — ABNORMAL LOW (ref 39.0–52.0)
Hemoglobin: 10.8 g/dL — ABNORMAL LOW (ref 13.0–17.0)
Immature Granulocytes: 1 %
Lymphocytes Relative: 6 %
Lymphs Abs: 1.1 10*3/uL (ref 0.7–4.0)
MCH: 30.9 pg (ref 26.0–34.0)
MCHC: 32.2 g/dL (ref 30.0–36.0)
MCV: 96 fL (ref 80.0–100.0)
Monocytes Absolute: 2.3 10*3/uL — ABNORMAL HIGH (ref 0.1–1.0)
Monocytes Relative: 12 %
Neutro Abs: 15.3 10*3/uL — ABNORMAL HIGH (ref 1.7–7.7)
Neutrophils Relative %: 81 %
Platelets: 313 10*3/uL (ref 150–400)
RBC: 3.49 MIL/uL — ABNORMAL LOW (ref 4.22–5.81)
RDW: 12.9 % (ref 11.5–15.5)
WBC: 18.9 10*3/uL — ABNORMAL HIGH (ref 4.0–10.5)
nRBC: 0 % (ref 0.0–0.2)

## 2020-08-28 LAB — BASIC METABOLIC PANEL
Anion gap: 7 (ref 5–15)
BUN: 14 mg/dL (ref 8–23)
CO2: 23 mmol/L (ref 22–32)
Calcium: 8.2 mg/dL — ABNORMAL LOW (ref 8.9–10.3)
Chloride: 104 mmol/L (ref 98–111)
Creatinine, Ser: 1.41 mg/dL — ABNORMAL HIGH (ref 0.61–1.24)
GFR, Estimated: 51 mL/min — ABNORMAL LOW (ref >60)
Glucose, Bld: 131 mg/dL — ABNORMAL HIGH (ref 70–99)
Potassium: 3.7 mmol/L (ref 3.5–5.1)
Sodium: 134 mmol/L — ABNORMAL LOW (ref 135–145)

## 2020-08-28 LAB — TROPONIN I (HIGH SENSITIVITY)
Troponin I (High Sensitivity): 9 ng/L (ref <18)
Troponin I (High Sensitivity): 9 ng/L (ref <18)

## 2020-08-28 LAB — CBG MONITORING, ED: Glucose-Capillary: 124 mg/dL — ABNORMAL HIGH (ref 70–99)

## 2020-08-28 MED ORDER — SODIUM CHLORIDE 0.9 % IV BOLUS
500.0000 mL | Freq: Once | INTRAVENOUS | Status: AC
  Administered 2020-08-28: 500 mL via INTRAVENOUS

## 2020-08-28 MED ORDER — CEPHALEXIN 500 MG PO CAPS: 500.0000 mg | ORAL_CAPSULE | Freq: Four times a day (QID) | ORAL | 0 refills | Status: AC

## 2020-08-28 MED ORDER — SODIUM CHLORIDE 0.9 % IV SOLN
1.0000 g | Freq: Once | INTRAVENOUS | Status: AC
  Administered 2020-08-28: 1 g via INTRAVENOUS
  Filled 2020-08-28: qty 10

## 2020-08-28 NOTE — ED Notes (Signed)
Pt ambulated in hallway with steady gait, denies dizziness, no assistive devices.

## 2020-08-28 NOTE — Discharge Instructions (Addendum)
Please seek medical attention for any high fevers, chest pain, shortness of breath, change in behavior, persistent vomiting, bloody stool or any other new or concerning symptoms.  

## 2020-08-28 NOTE — ED Notes (Signed)
No pacemaker present  

## 2020-08-28 NOTE — ED Notes (Signed)
ED Provider at bedside. 

## 2020-08-28 NOTE — ED Notes (Signed)
ED Provider Archie Balboa at bedside for reassessment and pt update on POC. Pt's son remains at bedside.

## 2020-08-28 NOTE — ED Notes (Signed)
Pt sts he is feeling better at this time.

## 2020-08-28 NOTE — ED Triage Notes (Signed)
Pt presents via EMS from home for near syncope. Pt sts he was outside in the yard for approx 1 hr and then got dizzy and felt like he might pass out. Per EMS, pt did have orthostatic changes in VS. Pt did not fall, hit head, or c/o chest pain, headache, fever, or illness today. Pt sts he is currently on ABX for UTI but was feeling well prior to being outside.

## 2020-08-28 NOTE — ED Provider Notes (Signed)
[facility] Emergency Department Provider Note  ____________________________________________   I have reviewed the triage vital signs and the nursing notes.   HISTORY  Chief Complaint Near Syncope   History limited by: Not Limited   HPI Garrett Moore is a 80 y.o. male who presents to the emergency department today because of concerns for a near syncopal episode.  The patient had been sitting on the bench outside on went to stand up he became dizzy.  Family then noticed that he was having a difficult time walking so they sent him back down.  Went to the fire station where he was found to have fast heart rate with standing and low blood pressure.  Patient denies any concurrent chest pain or palpitations.  He states he has felt weak for the past 2 days.  Patient states that he does have intermittent stinging with urination.  States that his oral intake has been decreased over the past day. Denies any fevers. Denies any diarrhea.    Records reviewed. Per medical record review patient has a history of CKD, COPD  Past Medical History:  Diagnosis Date  . Aneurysm of infrarenal abdominal aorta ([place]) 05/10/2020   s/p EVAR 07/03/2020  . Aortic atherosclerosis ([place])   . Aortic valvar stenosis 02/08/2018   Moderate  . Atrial fibrillation ([place])   . Basal cell carcinoma    Face  . BPH (benign prostatic hyperplasia)   . Carotid artery stenosis   . CKD (chronic kidney disease), stage III ([place])   . COPD (chronic obstructive pulmonary disease) ([place])   . Coronary artery disease   . DVT (deep venous thrombosis) ([place])    age 18, leg after Tonsillectomy  . Family history of adverse reaction to anesthesia    mother under anesthesia for extended period for AAA memory problems/dementia after  . Grade I diastolic dysfunction 63/1497  . Heart murmur   . History of kidney stones   . Hyperlipidemia   . Myocardial infarction ([place]) 07/1983   IABP overnight then removed and  nothing else.  . OA (osteoarthritis)   . Peripheral vascular disease ([place])   . Presence of arterial stent 07/03/2020   BILATERAL iliac arteries  . Ureteral calculus, left 07/2020    Patient Active Problem List   Diagnosis Date Noted  . Aortic valve disease 08/05/2020  . Atherosclerotic heart disease of native coronary artery without angina pectoris 08/05/2020  . Basal cell carcinoma of skin 08/05/2020  . Benign prostatic hyperplasia 08/05/2020  . Chronic kidney disease, stage 3 unspecified ([place]) 08/05/2020  . Chronic obstructive pulmonary disease, unspecified ([place]) 08/05/2020  . Frequent fecal incontinence 08/05/2020  . Glaucoma 08/05/2020  . Mixed hyperlipidemia 08/05/2020  . Other long term (current) drug therapy 08/05/2020  . AAA (abdominal aortic aneurysm) ([place]) 07/03/2020  . AAA (abdominal aortic aneurysm) without rupture ([place]) 05/26/2020  . S/P left THA, AA 02/21/2019  . Status post total hip replacement, left 02/21/2019    Past Surgical History:  Procedure Laterality Date  . ABDOMINAL AORTIC ANEURYSM REPAIR    . CARDIAC CATHETERIZATION  08/14/1983  . CARDIOVASCULAR STRESS TEST  01/23/2011   normal myocardial perfusion scan demonstrating an attenuation artifact in the inferior region of the myocardium. no ischemia or infarct/scar seen in remaining myocardium  . CAROTID DUPLEX  05/21/2006   right bulb-0-49% diameter reduction; left bulb and proximal ICA-0-49% diameter reduction  . CATARACT EXTRACTION, BILATERAL  01/2019  . CORONARY ANGIOPLASTY  07/1983   PTCA Prox RCA  .  CYSTOSCOPY W/ RETROGRADES  08/12/2020   Procedure: CYSTOSCOPY WITH RETROGRADE PYELOGRAM;  Surgeon: Hollice Espy, MD;  Location: [facility] ORS;  Service: Urology;;  . CYSTOSCOPY/URETEROSCOPY/HOLMIUM LASER/STENT PLACEMENT Left 08/12/2020   Procedure: CYSTOSCOPY/URETEROSCOPY/HOLMIUM LASER/STENT PLACEMENT;  Surgeon: Hollice Espy, MD;  Location: [facility] OR[facility];  Service: Urology;  Laterality: Left;  . DOPPLER  ECHOCARDIOGRAPHY  07/21/2012   EF 50-55%, aortic valve noncoronary cusp mobility was severely restricted.  . ENDOVASCULAR REPAIR/STENT GRAFT N/A 07/03/2020   Procedure: ENDOVASCULAR REPAIR/STENT GRAFT;  Surgeon: Katha Cabal, MD;  Location: [facility];  Service: Cardiovascular;  Laterality: N/A;  . EYE SURGERY Bilateral    cataracts  . ROTATOR CUFF REPAIR Left   . TEMPORARY PACEMAKER  07/1983  . TONSILLECTOMY    . TOTAL HIP ARTHROPLASTY Left 02/21/2019   Procedure: TOTAL HIP ARTHROPLASTY ANTERIOR APPROACH;  Surgeon: Paralee Cancel, MD;  Location: [facility];  Service: Orthopedics;  Laterality: Left;  70 mins  . VITRECTOMY Bilateral     Prior to Admission medications   Medication Sig Start Date End Date Taking? Authorizing Provider  acetaminophen (TYLENOL) 500 MG tablet Take 500 mg by mouth every 6 (six) hours as needed.    [provider]  aspirin EC 81 MG tablet Take 81 mg by mouth daily. Swallow whole.    [provider]  clopidogrel (PLAVIX) 75 MG tablet Take 1 tablet (75 mg total) by mouth daily at 6 (six) AM. 07/05/20   Stegmayer, Janalyn Harder, PA-C  Multiple Vitamin (MULTIVITAMIN WITH MINERALS) TABS tablet Take 1 tablet by mouth daily.    [provider]  nicotine polacrilex (COMMIT) 2 MG lozenge Take 2 mg by mouth as needed for smoking cessation.    [provider]  oxybutynin (DITROPAN) 5 MG tablet Take 1 tablet (5 mg total) by mouth every 8 (eight) hours as needed for bladder spasms. 08/12/20   Hollice Espy, MD  simvastatin (ZOCOR) 80 MG tablet Take 80 mg by mouth daily at 6 PM.     [provider]  tamsulosin (FLOMAX) 0.4 MG CAPS capsule Take 1 capsule (0.4 mg total) by mouth daily. 08/12/20   Hollice Espy, MD    Allergies Quinolones  Family History  Problem Relation Age of Onset  . Heart Problems Mother   . AAA (abdominal aortic aneurysm) Mother     Social History Social History   Tobacco Use  . Smoking status:  Former Smoker    Packs/day: 2.00    Years: 50.00    Pack years: 100.00    Types: Cigarettes  . Smokeless tobacco: Never Used  . Tobacco comment: quit 14 years ago  Vaping Use  . Vaping Use: Never used  Substance Use Topics  . Alcohol use: Not Currently  . Drug use: Never    Review of Systems Constitutional: No fever/chills Eyes: No visual changes. ENT: No sore throat. Cardiovascular: Denies chest pain. Respiratory: Denies shortness of breath. Gastrointestinal: No abdominal pain.  No nausea, no vomiting.  No diarrhea.   Genitourinary: Negative for dysuria. Musculoskeletal: Negative for back pain. Skin: Negative for rash. Neurological: Positive for lightheadedness.  ____________________________________________   PHYSICAL EXAM:  VITAL SIGNS: ED Triage Vitals  Enc Vitals Group     BP 08/28/20 1734 (!) 136/59     Pulse Rate 08/28/20 1732 89     Resp 08/28/20 1732 18     Temp 08/28/20 1732 99 F (37.2 C)     Temp Source 08/28/20 1732 Oral     SpO2 08/28/20  1734 99 %     Weight 08/28/20 1734 190 lb (86.2 kg)     Height 08/28/20 1734 5\' 11"  (1.803 m)     Head Circumference --      Peak Flow --      Pain Score 08/28/20 1735 0   Constitutional: Alert and oriented.  Eyes: Conjunctivae are normal.  ENT      Head: Normocephalic and atraumatic.      Nose: No congestion/rhinnorhea.      Mouth/Throat: Mucous membranes are moist.      Neck: No stridor. Hematological/Lymphatic/Immunilogical: No cervical lymphadenopathy. Cardiovascular: Normal rate, regular rhythm.  No murmurs, rubs, or gallops.  Respiratory: Normal respiratory effort without tachypnea nor retractions. Breath sounds are clear and equal bilaterally. No wheezes/rales/rhonchi. Gastrointestinal: Soft and non tender. No rebound. No guarding.  Genitourinary: Deferred Musculoskeletal: Normal range of motion in all extremities. No lower extremity edema. Neurologic:  Normal speech and language. No gross focal  neurologic deficits are appreciated.  Skin:  Skin is warm, dry and intact. No rash noted. Psychiatric: Mood and affect are normal. Speech and behavior are normal. Patient exhibits appropriate insight and judgment.  ____________________________________________    LABS (pertinent positives/negatives)  Trop hs 9 x 2 BMP na 134, k 3.7, glu 131, cr 1.41 UA cloudy, moderate hgb, nitrite positive, large leukocytes, wbc>50 CBC wbc 18.9, hgb 10.8, plt 313 ____________________________________________   EKG  I, Nance Pear, attending physician, personally viewed and interpreted this EKG  EKG Time: 1734 Rate: 91 Rhythm: sinus rhythm Axis: normal Intervals: qtc 464 QRS: narrow, q waves v1 ST changes: no st elevation Impression: abnormal ekg   ____________________________________________    RADIOLOGY  None  ____________________________________________   PROCEDURES  Procedures  ____________________________________________   INITIAL IMPRESSION / ASSESSMENT AND PLAN / ED COURSE  Pertinent labs & imaging results that were available during my care of the patient were reviewed by me and considered in my medical decision making (see chart for details).   Patient presented to the emergency department today after a near syncopal episode.  Patient had troponin negative x2.  EKG without concerning arrhythmia.  Patient's urine was consistent with a urinary tract infection.  Patient was given antibiotics here in the emergency department as well as IV fluids and did feel improvement.  Patient did have a leukocytosis in the blood work although it appears he has a slightly increased white blood cell count at baseline.  Afebrile here.  I do think is reasonable to trial outpatient antibiotics.  ___________________________________________   FINAL CLINICAL IMPRESSION(S) / ED DIAGNOSES  Final diagnoses:  Dehydration  Lower urinary tract infectious disease     Note: This dictation was  prepared with Dragon dictation. Any transcriptional errors that result from this process are unintentional     Nance Pear, MD 08/28/20 2151

## 2020-08-29 ENCOUNTER — Telehealth: Payer: Self-pay

## 2020-08-29 NOTE — Telephone Encounter (Signed)
It does not look like the ER sent a culture so we will be able to tell if it is the correct antibiotics.  I recommend taking the antibiotics prescribed and returning to our office after he completes the course if he fails to improve or his symptoms return.  Hollice Espy, MD

## 2020-08-29 NOTE — Telephone Encounter (Signed)
Pt aware and states he is feeling better already. Pt verbalized understanding.

## 2020-08-29 NOTE — Telephone Encounter (Signed)
Patient called stating he was in the ER last night for a UTI. He would like some recommendations on what he should do and if he is on the proper antibiotics. Please advise.

## 2020-09-09 ENCOUNTER — Telehealth: Payer: Self-pay

## 2020-09-09 NOTE — Telephone Encounter (Signed)
Pt LM on triage line stating that he completed antibiotics and would like to know if he needs to have urine tested again. Called pt informed him there is no need for repeat UA unless new symptoms arise. Pt gave verbal understanding.

## 2020-09-10 ENCOUNTER — Ambulatory Visit: Payer: Medicare HMO | Admitting: Urology

## 2020-09-24 ENCOUNTER — Ambulatory Visit
Admission: RE | Admit: 2020-09-24 | Discharge: 2020-09-24 | Disposition: A | Discharge status: Home / Self Care | Payer: Medicare HMO | Source: Ambulatory Visit | Attending: Urology | Admitting: Urology

## 2020-09-24 ENCOUNTER — Other Ambulatory Visit: Payer: Self-pay

## 2020-09-24 DIAGNOSIS — N281 Cyst of kidney, acquired: Secondary | ICD-10-CM | POA: Diagnosis not present

## 2020-09-24 DIAGNOSIS — Z87442 Personal history of urinary calculi: Secondary | ICD-10-CM | POA: Diagnosis not present

## 2020-09-24 DIAGNOSIS — N3289 Other specified disorders of bladder: Secondary | ICD-10-CM | POA: Diagnosis not present

## 2020-09-24 DIAGNOSIS — N201 Calculus of ureter: Secondary | ICD-10-CM | POA: Insufficient documentation

## 2020-09-25 ENCOUNTER — Encounter: Payer: Self-pay | Admitting: Urology

## 2020-09-25 ENCOUNTER — Ambulatory Visit (INDEPENDENT_AMBULATORY_CARE_PROVIDER_SITE_OTHER): Payer: Medicare HMO | Admitting: Urology

## 2020-09-25 VITALS — BP 138/65 | HR 86 | Ht 71.0 in | Wt 190.0 lb

## 2020-09-25 DIAGNOSIS — R351 Nocturia: Secondary | ICD-10-CM

## 2020-09-25 DIAGNOSIS — Z87898 Personal history of other specified conditions: Secondary | ICD-10-CM

## 2020-09-25 DIAGNOSIS — N201 Calculus of ureter: Secondary | ICD-10-CM

## 2020-09-25 MED ORDER — TAMSULOSIN HCL 0.4 MG PO CAPS
0.4000 mg | ORAL_CAPSULE | Freq: Every day | ORAL | 3 refills | Status: DC
Start: 2020-09-25 — End: 2021-10-22

## 2020-09-25 NOTE — Progress Notes (Signed)
09/25/2020 1:00 PM   Garrett Moore 28-Dec-1940 [id number]  Referring provider: [name], MD [place],  [place] [address]  Chief Complaint  Patient presents with   Left ureteral calculus    HPI: 80 year old male with a chronic obstructing left UVJ stone in the setting of a duplicated system returns today for postop follow-up.  Please see previous notes for details.  He was taken to the operating room on 08/12/2020 for complex ureteroscopy for a chronically obstructing distal UVJ stone in the setting of a complete left duplicated collecting system.  His postoperative course was complicated by urinary retention and UTI.  He reports today that he is finally recovered from all of this but it went quite some time.  He did not tolerate the stent particularly well.  He finally is no longer having burning urgency or frequency.  He follows up today with a renal ultrasound prior.  This indicates resolution of bilateral hydronephrosis and is otherwise unremarkable.  He does have thickened bladder however its decompressed at the time of study.  He denies any flank pain.  He was never symptomatic from stone.  No previous history of stone disease.  Stone analysis 100% calcium oxalate monohydrate stone.  Lastly, he mentions today that he has chronic nocturia.  He gets up at least every hour.  Sometimes he does not have to void a significant amount but he wakes up to go to the bathroom.  He wonders if this is just a habit.  Does not have significant daytime symptoms.  A few years back, he was tried on a medication by his primary care physician but cannot remember what it was.  He does not recall that this was helpful.  He does snore at nighttime.   PMH: Past Medical History:  Diagnosis Date   Aneurysm of infrarenal abdominal aorta ([place]) 05/10/2020   s/p EVAR 07/03/2020   Aortic atherosclerosis ([place])    Aortic valvar stenosis 02/08/2018   Moderate   Atrial fibrillation  ([facility])    Basal cell carcinoma    Face   BPH (benign prostatic hyperplasia)    Carotid artery stenosis    CKD (chronic kidney disease), stage III ([facility])    COPD (chronic obstructive pulmonary disease) ([facility])    Coronary artery disease    DVT (deep venous thrombosis) ([place])    age 12, leg after Tonsillectomy   Family history of adverse reaction to anesthesia    mother under anesthesia for extended period for AAA memory problems/dementia after   Grade I diastolic dysfunction 75/6433   Heart murmur    History of kidney stones    Hyperlipidemia    Myocardial infarction ([place]) 07/1983   IABP overnight then removed and nothing else.   OA (osteoarthritis)    Peripheral vascular disease ([facility])    Presence of arterial stent 07/03/2020   BILATERAL iliac arteries   Ureteral calculus, left 07/2020    Surgical History: Past Surgical History:  Procedure Laterality Date   ABDOMINAL AORTIC ANEURYSM REPAIR     CARDIAC CATHETERIZATION  08/14/1983   CARDIOVASCULAR STRESS TEST  01/23/2011   normal myocardial perfusion scan demonstrating an attenuation artifact in the inferior region of the myocardium. no ischemia or infarct/scar seen in remaining myocardium   CAROTID DUPLEX  05/21/2006   right bulb-0-49% diameter reduction; left bulb and proximal ICA-0-49% diameter reduction   CATARACT EXTRACTION, BILATERAL  01/2019   CORONARY ANGIOPLASTY  07/1983   PTCA Prox RCA   CYSTOSCOPY  W/ RETROGRADES  08/12/2020   Procedure: CYSTOSCOPY WITH RETROGRADE PYELOGRAM;  Surgeon: Hollice Espy, MD;  Location: [facility];  Service: Urology;;   CYSTOSCOPY/URETEROSCOPY/HOLMIUM LASER/STENT PLACEMENT Left 08/12/2020   Procedure: CYSTOSCOPY/URETEROSCOPY/HOLMIUM LASER/STENT PLACEMENT;  Surgeon: Hollice Espy, MD;  Location: [facility];  Service: Urology;  Laterality: Left;   DOPPLER ECHOCARDIOGRAPHY  07/21/2012   EF 50-55%, aortic valve noncoronary cusp mobility was severely restricted.   ENDOVASCULAR REPAIR/STENT GRAFT N/A  07/03/2020   Procedure: ENDOVASCULAR REPAIR/STENT GRAFT;  Surgeon: Katha Cabal, MD;  Location: [facility];  Service: Cardiovascular;  Laterality: N/A;   EYE SURGERY Bilateral    cataracts   ROTATOR CUFF REPAIR Left    TEMPORARY PACEMAKER  07/1983   TONSILLECTOMY     TOTAL HIP ARTHROPLASTY Left 02/21/2019   Procedure: TOTAL HIP ARTHROPLASTY ANTERIOR APPROACH;  Surgeon: Paralee Cancel, MD;  Location: [facility];  Service: Orthopedics;  Laterality: Left;  70 mins   VITRECTOMY Bilateral     Home Medications:  Allergies as of 09/25/2020       Reactions   Quinolones    Other reaction(s): Has aortic root dilatation        Medication List        Accurate as of September 25, 2020  1:00 PM. If you have any questions, ask your nurse or doctor.          STOP taking these medications    oxybutynin 5 MG tablet Commonly known as: DITROPAN Stopped by: Hollice Espy, MD       TAKE these medications    acetaminophen 500 MG tablet Commonly known as: TYLENOL Take 500 mg by mouth every 6 (six) hours as needed.   aspirin EC 81 MG tablet Take 81 mg by mouth daily. Swallow whole.   clopidogrel 75 MG tablet Commonly known as: PLAVIX Take 1 tablet (75 mg total) by mouth daily at 6 (six) AM.   multivitamin with minerals Tabs tablet Take 1 tablet by mouth daily.   nicotine polacrilex 2 MG lozenge Commonly known as: COMMIT Take 2 mg by mouth as needed for smoking cessation.   simvastatin 80 MG tablet Commonly known as: ZOCOR Take 80 mg by mouth daily at 6 PM.   tamsulosin 0.4 MG Caps capsule Commonly known as: Flomax Take 1 capsule (0.4 mg total) by mouth daily.        Allergies:  Allergies  Allergen Reactions   Quinolones     Other reaction(s): Has aortic root dilatation    Family History: Family History  Problem Relation Age of Onset   Heart Problems Mother    AAA (abdominal aortic aneurysm) Mother     Social History:  reports that he has quit smoking.  His smoking use included cigarettes. He has a 100.00 pack-year smoking history. He has never used smokeless tobacco. He reports previous alcohol use. He reports that he does not use drugs.   Physical Exam: BP 138/65   Pulse 86   Ht 5\' 11"  (1.803 m)   Wt 190 lb (86.2 kg)   BMI 26.50 kg/m   Constitutional:  Alert and oriented, No acute distress.  Accompanied by his wife today. HEENT: [place] AT, moist mucus membranes.  Trachea midline, no masses. Cardiovascular: No clubbing, cyanosis, or edema. Respiratory: Normal respiratory effort, no increased work of breathing. Skin: No rashes, bruises or suspicious lesions. Neurologic: Grossly intact, no focal deficits, moving all 4 extremities. Psychiatric: Normal mood and affect.  Laboratory Data: Lab Results  Component Value Date  WBC 18.9 (H) 08/28/2020   HGB 10.8 (L) 08/28/2020   HCT 33.5 (L) 08/28/2020   MCV 96.0 08/28/2020   PLT 313 08/28/2020    Lab Results  Component Value Date   CREATININE 1.41 (H) 08/28/2020   Pertinent Imaging:  Ultrasound renal complete  Narrative CLINICAL DATA:  Nephrolithiasis  EXAM: RENAL / URINARY TRACT ULTRASOUND COMPLETE  COMPARISON:  CT abdomen pelvis 08/14/2020  FINDINGS: Right Kidney:  Renal measurements: 10.3 x 5.0 x 4.9 cm = volume: 135 mL. Echogenicity within normal limits. No mass or hydronephrosis visualized.  Left Kidney:  Renal measurements: 11.0 x 5.4 x 5.3 cm = volume: 164 mL. Echogenicity within normal limits. No mass or hydronephrosis visualized. 1.5 cm simple cyst seen in the upper pole containing a thin internal septation. Additional 1.7 cm simple cyst seen in the lower pole.  Bladder:  Diffuse bladder wall thickening most likely due to under distension. Chronic cystitis and outlet obstruction can have a similar appearance.  Other:  None.  IMPRESSION: 1. No sonographically evident nephrolithiasis. 2. No hydronephrosis.   Electronically Signed By: Miachel Roux  M.D. On: 09/24/2020 10:33  Renal ultrasound images were personally reviewed today, agree with radiologic interpretation.  Assessment & Plan:    1. Left ureteral calculus Status post successful ESWL followed by stent removal  Follow-up renal ultrasound is reassuring without persistent hydronephrosis or any other concerning findings  Reviewed stone composition  We discussed general stone prevention techniques including drinking plenty water with goal of producing 2.5 L urine daily, increased citric acid intake, avoidance of high oxalate containing foods, and decreased salt intake.  Information about dietary recommendations given today.    Plan for KUB in a year given that it is fairly asymptomatic in the to assess for any new stone burden - DG Abd 1 View; Future  2. Nocturia Likely multifactorial given absence of daytime symptoms  We discussed behavioral modification today  In addition to the above, but asked him to explore the possibility of undiagnosed untreated sleep apnea as a contributing factor  Trial of Flomax again to see if this helps with his nighttime symptoms, if not, we can adjust medications depending on his bother  3. History of urinary retention Resolved   Follow-up in 1 year with KUB/IPSS/PVR or sooner as needed  Hollice Espy, MD  [facility] [address] [place], [place] [address] [phone]

## 2020-10-23 DIAGNOSIS — H5213 Myopia, bilateral: Secondary | ICD-10-CM | POA: Diagnosis not present

## 2021-01-27 ENCOUNTER — Encounter (INDEPENDENT_AMBULATORY_CARE_PROVIDER_SITE_OTHER): Payer: Self-pay | Admitting: Vascular Surgery

## 2021-01-27 ENCOUNTER — Other Ambulatory Visit: Payer: Self-pay

## 2021-01-27 ENCOUNTER — Other Ambulatory Visit (INDEPENDENT_AMBULATORY_CARE_PROVIDER_SITE_OTHER): Payer: Self-pay | Admitting: Vascular Surgery

## 2021-01-27 ENCOUNTER — Ambulatory Visit (INDEPENDENT_AMBULATORY_CARE_PROVIDER_SITE_OTHER): Payer: Medicare HMO

## 2021-01-27 ENCOUNTER — Ambulatory Visit (INDEPENDENT_AMBULATORY_CARE_PROVIDER_SITE_OTHER): Payer: Medicare HMO | Admitting: Vascular Surgery

## 2021-01-27 VITALS — BP 112/72 | HR 84 | Resp 16 | Wt 186.0 lb

## 2021-01-27 DIAGNOSIS — I714 Abdominal aortic aneurysm, without rupture, unspecified: Secondary | ICD-10-CM | POA: Diagnosis not present

## 2021-01-27 DIAGNOSIS — I2510 Atherosclerotic heart disease of native coronary artery without angina pectoris: Secondary | ICD-10-CM

## 2021-01-27 DIAGNOSIS — J449 Chronic obstructive pulmonary disease, unspecified: Secondary | ICD-10-CM | POA: Diagnosis not present

## 2021-01-27 DIAGNOSIS — I7143 Infrarenal abdominal aortic aneurysm, without rupture: Secondary | ICD-10-CM

## 2021-01-27 DIAGNOSIS — E782 Mixed hyperlipidemia: Secondary | ICD-10-CM

## 2021-01-27 NOTE — Progress Notes (Signed)
MRN : [id number]  Garrett Moore is a 80 y.o. ([birth year]) male who presents with chief complaint of check my aneurysm.  History of Present Illness:   The patient returns to the office for surveillance of an abdominal aortic aneurysm status post stent graft placement on 07/03/2020.    Patient denies abdominal pain or back pain, no other abdominal complaints. No groin related complaints. No symptoms consistent with distal embolization No changes in claudication distance.    There have been no interval changes in his overall healthcare since his last visit.    Patient denies amaurosis fugax or TIA symptoms. There is no history of claudication or rest pain symptoms of the lower extremities. The patient denies angina or shortness of breath.    Duplex US of the aorta and iliac arteries shows a 5.0 cm AAA sac with no endoleak.  The right common iliac artery aneurysm now measures 3.7 cm in diameter.  Compared to previous study there appears to be a slight increase in the abdominal aortic aneurysm sac.  However, a CT scan dated Aug 14, 2020 approximately 1 month status post endovascular repair demonstrated a 6.8 cm abdominal aortic aneurysm sac with a 4.0 cm right common iliac artery aneurysm.  Current Meds  Medication Sig   acetaminophen (TYLENOL) 500 MG tablet Take 500 mg by mouth every 6 (six) hours as needed.   aspirin EC 81 MG tablet Take 81 mg by mouth daily. Swallow whole.   clopidogrel (PLAVIX) 75 MG tablet Take 1 tablet (75 mg total) by mouth daily at 6 (six) AM.   Multiple Vitamin (MULTIVITAMIN WITH MINERALS) TABS tablet Take 1 tablet by mouth daily.   nicotine polacrilex (COMMIT) 2 MG lozenge Take 2 mg by mouth as needed for smoking cessation.   simvastatin (ZOCOR) 80 MG tablet Take 80 mg by mouth daily at 6 PM.    tamsulosin (FLOMAX) 0.4 MG CAPS capsule Take 1 capsule (0.4 mg total) by mouth daily.   Current Facility-Administered Medications for the 01/27/21 encounter (Office  Visit) with Delana Meyer, Dolores Lory, MD  Medication   cefTRIAXone (ROCEPHIN) injection 1 g    Past Medical History:  Diagnosis Date   Aneurysm of infrarenal abdominal aorta 05/10/2020   s/p EVAR 07/03/2020   Aortic atherosclerosis (HCC)    Aortic valvar stenosis 02/08/2018   Moderate   Atrial fibrillation (HCC)    Basal cell carcinoma    Face   BPH (benign prostatic hyperplasia)    Carotid artery stenosis    CKD (chronic kidney disease), stage III (HCC)    COPD (chronic obstructive pulmonary disease) (HCC)    Coronary artery disease    DVT (deep venous thrombosis) ([place])    age 69, leg after Tonsillectomy   Family history of adverse reaction to anesthesia    mother under anesthesia for extended period for AAA memory problems/dementia after   Grade I diastolic dysfunction 19/5093   Heart murmur    History of kidney stones    Hyperlipidemia    Myocardial infarction ([place]) 07/1983   IABP overnight then removed and nothing else.   OA (osteoarthritis)    Peripheral vascular disease (HCC)    Presence of arterial stent 07/03/2020   BILATERAL iliac arteries   Ureteral calculus, left 07/2020    Past Surgical History:  Procedure Laterality Date   ABDOMINAL AORTIC ANEURYSM REPAIR     CARDIAC CATHETERIZATION  08/14/1983   CARDIOVASCULAR STRESS TEST  01/23/2011   normal myocardial perfusion scan demonstrating an  attenuation artifact in the inferior region of the myocardium. no ischemia or infarct/scar seen in remaining myocardium   CAROTID DUPLEX  05/21/2006   right bulb-0-49% diameter reduction; left bulb and proximal ICA-0-49% diameter reduction   CATARACT EXTRACTION, BILATERAL  01/2019   CORONARY ANGIOPLASTY  07/1983   PTCA Prox RCA   CYSTOSCOPY W/ RETROGRADES  08/12/2020   Procedure: CYSTOSCOPY WITH RETROGRADE PYELOGRAM;  Surgeon: Hollice Espy, MD;  Location: [facility];  Service: Urology;;   CYSTOSCOPY/URETEROSCOPY/HOLMIUM LASER/STENT PLACEMENT Left 08/12/2020   Procedure:  CYSTOSCOPY/URETEROSCOPY/HOLMIUM LASER/STENT PLACEMENT;  Surgeon: Hollice Espy, MD;  Location: [facility];  Service: Urology;  Laterality: Left;   DOPPLER ECHOCARDIOGRAPHY  07/21/2012   EF 50-55%, aortic valve noncoronary cusp mobility was severely restricted.   ENDOVASCULAR REPAIR/STENT GRAFT N/A 07/03/2020   Procedure: ENDOVASCULAR REPAIR/STENT GRAFT;  Surgeon: Katha Cabal, MD;  Location: [facility];  Service: Cardiovascular;  Laterality: N/A;   EYE SURGERY Bilateral    cataracts   ROTATOR CUFF REPAIR Left    TEMPORARY PACEMAKER  07/1983   TONSILLECTOMY     TOTAL HIP ARTHROPLASTY Left 02/21/2019   Procedure: TOTAL HIP ARTHROPLASTY ANTERIOR APPROACH;  Surgeon: Paralee Cancel, MD;  Location: [facility];  Service: Orthopedics;  Laterality: Left;  70 mins   VITRECTOMY Bilateral     Social History Social History   Tobacco Use   Smoking status: Former    Packs/day: 2.00    Years: 50.00    Pack years: 100.00    Types: Cigarettes   Smokeless tobacco: Never   Tobacco comments:    quit 14 years ago  Vaping Use   Vaping Use: Never used  Substance Use Topics   Alcohol use: Not Currently   Drug use: Never    Family History Family History  Problem Relation Age of Onset   Heart Problems Mother    AAA (abdominal aortic aneurysm) Mother     Allergies  Allergen Reactions   Quinolones     Other reaction(s): Has aortic root dilatation     REVIEW OF SYSTEMS (Negative unless checked)  Constitutional: [] Weight loss  [] Fever  [] Chills Cardiac: [] Chest pain   [] Chest pressure   [] Palpitations   [] Shortness of breath when laying flat   [] Shortness of breath with exertion. Vascular:  [] Pain in legs with walking   [] Pain in legs at rest  [] History of DVT   [] Phlebitis   [] Swelling in legs   [] Varicose veins   [] Non-healing ulcers Pulmonary:   [] Uses home oxygen   [] Productive cough   [] Hemoptysis   [] Wheeze  [] COPD   [] Asthma Neurologic:  [] Dizziness   [] Seizures   [] History of  stroke   [] History of TIA  [] Aphasia   [] Vissual changes   [] Weakness or numbness in arm   [] Weakness or numbness in leg Musculoskeletal:   [] Joint swelling   [] Joint pain   [] Low back pain Hematologic:  [] Easy bruising  [] Easy bleeding   [] Hypercoagulable state   [] Anemic Gastrointestinal:  [] Diarrhea   [] Vomiting  [] Gastroesophageal reflux/heartburn   [] Difficulty swallowing. Genitourinary:  [] Chronic kidney disease   [] Difficult urination  [] Frequent urination   [] Blood in urine Skin:  [] Rashes   [] Ulcers  Psychological:  [] History of anxiety   []  History of major depression.  Physical Examination  Vitals:   01/27/21 0908  BP: 112/72  Pulse: 84  Resp: 16  Weight: 186 lb (84.4 kg)   Body mass index is 25.94 kg/m. Gen: WD/WN, NAD Head: [place]/AT, No temporalis wasting.  Ear/Nose/Throat: Hearing grossly intact, nares w/o erythema or drainage Eyes: PER, EOMI, sclera nonicteric.  Neck: Supple, no masses.  No bruit or JVD.  Pulmonary:  Good air movement, no audible wheezing, no use of accessory muscles.  Cardiac: RRR, normal S1, S2, no Murmurs. Vascular:   Vessel Right Left  Radial Palpable Palpable  Gastrointestinal: soft, non-distended. No guarding/no peritoneal signs.  Musculoskeletal: M/S 5/5 throughout.  No visible deformity.  Neurologic: CN 2-12 intact. Pain and light touch intact in extremities.  Symmetrical.  Speech is fluent. Motor exam as listed above. Psychiatric: Judgment intact, Mood & affect appropriate for pt's clinical situation. Dermatologic: No rashes or ulcers noted.  No changes consistent with cellulitis.   CBC Lab Results  Component Value Date   WBC 18.9 (H) 08/28/2020   HGB 10.8 (L) 08/28/2020   HCT 33.5 (L) 08/28/2020   MCV 96.0 08/28/2020   PLT 313 08/28/2020    BMET    Component Value Date/Time   NA 134 (L) 08/28/2020 1801   K 3.7 08/28/2020 1801   CL 104 08/28/2020 1801   CO2 23 08/28/2020 1801   GLUCOSE 131 (H) 08/28/2020 1801   BUN 14  08/28/2020 1801   CREATININE 1.41 (H) 08/28/2020 1801   CALCIUM 8.2 (L) 08/28/2020 1801   GFRNONAA 51 (L) 08/28/2020 1801   GFRAA >60 02/22/2019 0236   CrCl cannot be calculated (Patient's most recent lab result is older than the maximum 21 days allowed.).  COAG Lab Results  Component Value Date   INR 1.1 06/20/2020    Radiology No results found.   Assessment/Plan 1. Infrarenal abdominal aortic aneurysm (AAA) without rupture Recommend: Patient is status post successful endovascular repair of the AAA.   No further intervention is required at this time.   No endoleak is detected and the aneurysm sac is stable.  The patient will continue antiplatelet therapy as prescribed as well as aggressive management of hyperlipidemia. Exercise is again strongly encouraged.   However, endografts require continued surveillance with ultrasound or CT scan. This is mandatory to detect any changes that allow repressurization of the aneurysm sac.  The patient is informed that this would be asymptomatic.  The patient is reminded that lifelong routine surveillance is a necessity with an endograft. Patient will continue to follow-up at 6 month intervals with ultrasound of the aorta.  I will continue the 41-month interval at this point given the slight increase in the abdominal aortic aneurysm sac however no endoleak is detected and when compared to the CT scan from 6 months ago there has been considerable shrinkage of the sac.  Consequently I am quite confident that his endograft is doing just fine  - VAS US AORTA/IVC/ILIACS; Future  2. Atherosclerosis of native coronary artery of native heart without angina pectoris Continue cardiac and antihypertensive medications as already ordered and reviewed, no changes at this time.  Continue statin as ordered and reviewed, no changes at this time  Nitrates PRN for chest pain   3. Chronic obstructive pulmonary disease, unspecified COPD type ([place]) Continue  pulmonary medications and aerosols as already ordered, these medications have been reviewed and there are no changes at this time.    4. Mixed hyperlipidemia Continue statin as ordered and reviewed, no changes at this time     [name], MD  01/27/2021 9:21 AM

## 2021-02-27 DIAGNOSIS — E782 Mixed hyperlipidemia: Secondary | ICD-10-CM | POA: Diagnosis not present

## 2021-02-27 DIAGNOSIS — N183 Chronic kidney disease, stage 3 unspecified: Secondary | ICD-10-CM | POA: Diagnosis not present

## 2021-02-27 DIAGNOSIS — H409 Unspecified glaucoma: Secondary | ICD-10-CM | POA: Diagnosis not present

## 2021-02-27 DIAGNOSIS — N400 Enlarged prostate without lower urinary tract symptoms: Secondary | ICD-10-CM | POA: Diagnosis not present

## 2021-02-27 DIAGNOSIS — J449 Chronic obstructive pulmonary disease, unspecified: Secondary | ICD-10-CM | POA: Diagnosis not present

## 2021-02-27 DIAGNOSIS — I2510 Atherosclerotic heart disease of native coronary artery without angina pectoris: Secondary | ICD-10-CM | POA: Diagnosis not present

## 2021-03-30 DIAGNOSIS — U071 COVID-19: Secondary | ICD-10-CM | POA: Diagnosis not present

## 2021-04-17 DIAGNOSIS — E782 Mixed hyperlipidemia: Secondary | ICD-10-CM | POA: Diagnosis not present

## 2021-04-17 DIAGNOSIS — Z79899 Other long term (current) drug therapy: Secondary | ICD-10-CM | POA: Diagnosis not present

## 2021-04-21 DIAGNOSIS — Z0000 Encounter for general adult medical examination without abnormal findings: Secondary | ICD-10-CM | POA: Diagnosis not present

## 2021-04-21 DIAGNOSIS — N400 Enlarged prostate without lower urinary tract symptoms: Secondary | ICD-10-CM | POA: Diagnosis not present

## 2021-04-21 DIAGNOSIS — N183 Chronic kidney disease, stage 3 unspecified: Secondary | ICD-10-CM | POA: Diagnosis not present

## 2021-04-21 DIAGNOSIS — J449 Chronic obstructive pulmonary disease, unspecified: Secondary | ICD-10-CM | POA: Diagnosis not present

## 2021-04-21 DIAGNOSIS — E782 Mixed hyperlipidemia: Secondary | ICD-10-CM | POA: Diagnosis not present

## 2021-05-30 ENCOUNTER — Ambulatory Visit: Payer: Medicare HMO | Admitting: Cardiovascular Disease

## 2021-05-30 ENCOUNTER — Other Ambulatory Visit: Payer: Self-pay

## 2021-05-30 ENCOUNTER — Encounter: Payer: Self-pay | Admitting: Cardiovascular Disease

## 2021-05-30 VITALS — BP 120/60 | HR 85 | Ht 71.0 in | Wt 183.0 lb

## 2021-05-30 DIAGNOSIS — E782 Mixed hyperlipidemia: Secondary | ICD-10-CM

## 2021-05-30 DIAGNOSIS — I779 Disorder of arteries and arterioles, unspecified: Secondary | ICD-10-CM

## 2021-05-30 DIAGNOSIS — I7143 Infrarenal abdominal aortic aneurysm, without rupture: Secondary | ICD-10-CM

## 2021-05-30 DIAGNOSIS — I350 Nonrheumatic aortic (valve) stenosis: Secondary | ICD-10-CM

## 2021-05-30 DIAGNOSIS — I739 Peripheral vascular disease, unspecified: Secondary | ICD-10-CM | POA: Diagnosis not present

## 2021-05-30 NOTE — Progress Notes (Signed)
Cardiology Office Note  Date:  05/30/2021   ID:  Garrett Moore, Garrett Moore Dec 14, 1940, MRN [id number]  PCP:  [name], MD   Chief Complaint  Patient presents with   12 month follow up     "Doing well." Medications reviewed by the patient verbally.     HPI:  [name] is a 81 year old gentleman with past medical history of Peripheral arterial disease, AAA CAD, PTCA of RCA (sx of fatigue) 1985 Aortic valve stenosis Mildly dilated ascending aorta Mild to moderate bilateral carotid arterial disease Presents for f/u of his PAD, AAA repair,March 2022, CAD, moderate AS  In follow-up today reports he is doing well, active with no complaints Denies any chest pain, no GERD symptoms concerning for angina  AAA repair, no endoleak on u/s Vascular  Urinary stone, in the ER 6/22 Seen by urology, stent placed  Weight down Active. Walks daily no "heart burn"  Echo 3/22 Echocardiogram Normal LV function Aortic valve  moderate stenosis  EKG personally reviewed by myself on todays visit NSR rate 85 bpm, NO st or T wave changes  Past medical history reviewed catheterization from 1985 reviewed Angioplasty to RCA at that time  Previously seen by cardiology in 2014 Echocardiogram at that time with mild aortic valve stenosis at that time, mild dilation of aortic root and ascending aorta  CT scan is reviewed on today's visit, images pulled up AAA estimated at 6.4cm   PMH:   has a past medical history of Aneurysm of infrarenal abdominal aorta (05/10/2020), Aortic atherosclerosis ([place]), Aortic valvar stenosis (02/08/2018), Atrial fibrillation ([place]), Basal cell carcinoma, BPH (benign prostatic hyperplasia), Carotid artery stenosis, CKD (chronic kidney disease), stage III ([place]), COPD (chronic obstructive pulmonary disease) ([place]), Coronary artery disease, DVT (deep venous thrombosis) ([place]), Family history of adverse reaction to anesthesia, Grade I diastolic dysfunction (56/3875),  Heart murmur, History of kidney stones, Hyperlipidemia, Myocardial infarction ([place]) (07/1983), OA (osteoarthritis), Peripheral vascular disease ([place]), Presence of arterial stent (07/03/2020), and Ureteral calculus, left (07/2020).  PSH:    Past Surgical History:  Procedure Laterality Date   ABDOMINAL AORTIC ANEURYSM REPAIR     CARDIAC CATHETERIZATION  08/14/1983   CARDIOVASCULAR STRESS TEST  01/23/2011   normal myocardial perfusion scan demonstrating an attenuation artifact in the inferior region of the myocardium. no ischemia or infarct/scar seen in remaining myocardium   CAROTID DUPLEX  05/21/2006   right bulb-0-49% diameter reduction; left bulb and proximal ICA-0-49% diameter reduction   CATARACT EXTRACTION, BILATERAL  01/2019   CORONARY ANGIOPLASTY  07/1983   PTCA Prox RCA   CYSTOSCOPY W/ RETROGRADES  08/12/2020   Procedure: CYSTOSCOPY WITH RETROGRADE PYELOGRAM;  Surgeon: Hollice Espy, MD;  Location: [facility];  Service: Urology;;   CYSTOSCOPY/URETEROSCOPY/HOLMIUM LASER/STENT PLACEMENT Left 08/12/2020   Procedure: CYSTOSCOPY/URETEROSCOPY/HOLMIUM LASER/STENT PLACEMENT;  Surgeon: Hollice Espy, MD;  Location: [facility];  Service: Urology;  Laterality: Left;   DOPPLER ECHOCARDIOGRAPHY  07/21/2012   EF 50-55%, aortic valve noncoronary cusp mobility was severely restricted.   ENDOVASCULAR REPAIR/STENT GRAFT N/A 07/03/2020   Procedure: ENDOVASCULAR REPAIR/STENT GRAFT;  Surgeon: Katha Cabal, MD;  Location: [facility];  Service: Cardiovascular;  Laterality: N/A;   EYE SURGERY Bilateral    cataracts   ROTATOR CUFF REPAIR Left    TEMPORARY PACEMAKER  07/1983   TONSILLECTOMY     TOTAL HIP ARTHROPLASTY Left 02/21/2019   Procedure: TOTAL HIP ARTHROPLASTY ANTERIOR APPROACH;  Surgeon: Paralee Cancel, MD;  Location: [facility];  Service: Orthopedics;  Laterality: Left;  70 mins   VITRECTOMY Bilateral     Current Outpatient Medications  Medication Sig Dispense Refill   acetaminophen  (TYLENOL) 500 MG tablet Take 500 mg by mouth every 6 (six) hours as needed.     aspirin EC 81 MG tablet Take 81 mg by mouth daily. Swallow whole.     Multiple Vitamin (MULTIVITAMIN WITH MINERALS) TABS tablet Take 1 tablet by mouth daily.     nicotine polacrilex (COMMIT) 2 MG lozenge Take 2 mg by mouth as needed for smoking cessation.     simvastatin (ZOCOR) 80 MG tablet Take 80 mg by mouth daily at 6 PM.      tamsulosin (FLOMAX) 0.4 MG CAPS capsule Take 1 capsule (0.4 mg total) by mouth daily. (Patient taking differently: Take 0.8 mg by mouth daily.) 90 capsule 3   clopidogrel (PLAVIX) 75 MG tablet Take 1 tablet (75 mg total) by mouth daily at 6 (six) AM. (Patient not taking: Reported on 05/30/2021) 90 tablet 3   Current Facility-Administered Medications  Medication Dose Route Frequency Provider Last Rate Last Admin   cefTRIAXone (ROCEPHIN) injection 1 g  1 g Intramuscular Q24H Hollice Espy, MD   1 g at 08/21/20 1128     Allergies:   Quinolones   Social History:  The patient  reports that he has quit smoking. His smoking use included cigarettes. He has a 100.00 pack-year smoking history. He has never used smokeless tobacco. He reports that he does not currently use alcohol. He reports that he does not use drugs.   Family History:   family history includes AAA (abdominal aortic aneurysm) in his mother; Heart Problems in his mother.    Review of Systems: Review of Systems  Constitutional: Negative.   HENT: Negative.    Respiratory: Negative.    Cardiovascular: Negative.   Gastrointestinal: Negative.   Musculoskeletal: Negative.   Neurological: Negative.   Psychiatric/Behavioral: Negative.    All other systems reviewed and are negative.  PHYSICAL EXAM: VS:  BP 120/60 (BP Location: Left Arm, Patient Position: Sitting, Cuff Size: Normal)    Pulse 85    Ht '5\' 11"'$  (1.803 m)    Wt 183 lb (83 kg)    SpO2 97%    BMI 25.52 kg/m  , BMI Body mass index is 25.52 kg/m. Constitutional:   oriented to person, place, and time. No distress.  HENT:  Head: Grossly normal Eyes:  no discharge. No scleral icterus.  Neck: No JVD, no carotid bruits  Cardiovascular: Regular rate and rhythm, no murmurs appreciated Pulmonary/Chest: Clear to auscultation bilaterally, no wheezes or rails Abdominal: Soft.  no distension.  no tenderness.  Musculoskeletal: Normal range of motion Neurological:  normal muscle tone. Coordination normal. No atrophy Skin: Skin warm and dry Psychiatric: normal affect, pleasant  Recent Labs: 08/14/2020: ALT 13 08/28/2020: BUN 14; Creatinine, Ser 1.41; Hemoglobin 10.8; Platelets 313; Potassium 3.7; Sodium 134    Lipid Panel No results found for: CHOL, HDL, LDLCALC, TRIG    Wt Readings from Last 3 Encounters:  05/30/21 183 lb (83 kg)  01/27/21 186 lb (84.4 kg)  09/25/20 190 lb (86.2 kg)     ASSESSMENT AND PLAN:  Problem List Items Addressed This Visit       Cardiology Problems   Mixed hyperlipidemia   AAA (abdominal aortic aneurysm) without rupture - Primary   Other Visit Diagnoses     Aortic valve stenosis, etiology of cardiac valve disease unspecified       Carotid artery disease, unspecified  laterality, unspecified type ([place])       PAD (peripheral artery disease) ([place])         AAA Completed endovascular repair with Dr. Junie Panning  Stable ultrasound, results discussed with him  Aortic valve stenosis Mild stenosis in 2014, Moderate on echocardiogram 2022 Interestingly no appreciable murmur on today's visit He prefers to hold on additional echo at this time, will reconsider in 1 years time Discussed symptoms of worsening aortic valve stenosis including chest tightness, shortness of breath.  Reports currently asymptomatic  PAD/carotid stenosis Mild to moderate disease noted 2014 We have requested lipids from primary care  Hyperlipidemia Stay on simvastatin, lipids from primary care  CAD with stable angina Prior PTCA 1980s, no  intervention since that time Denies anginal symptoms,  Dilated aorta Previously noted to be 4 cm in 2014, repeat echocardiogram ordered    Total encounter time more than 30 minutes  Greater than 50% was spent in counseling and coordination of care with the patient   Signed, Esmond Plants, M.D., Ph.D. [place], [place]

## 2021-05-30 NOTE — Patient Instructions (Addendum)
Medication Instructions:  No changes  If you need a refill on your cardiac medications before your next appointment, please call your pharmacy.   Lab work: No new labs needed  Testing/Procedures: No new testing needed  Follow-Up: At [facility], you and your health needs are our priority.  As part of our continuing mission to provide you with exceptional heart care, we have created designated Provider Care Teams.  These Care Teams include your primary Cardiologist (physician) and Advanced Practice Providers (APPs -  Physician Assistants and Nurse Practitioners) who all work together to provide you with the care you need, when you need it.  You will need a follow up appointment in 12 months  Providers on your designated Care Team:   Christopher Berge, NP Ryan Dunn, PA-C Cadence Furth, PA-C  COVID-19 Vaccine Information can be found at: [email or web address] For questions related to vaccine distribution or appointments, please email [email or web address] or call [phone].   

## 2021-06-02 ENCOUNTER — Encounter: Payer: Self-pay | Admitting: Cardiovascular Disease

## 2021-07-22 DIAGNOSIS — H524 Presbyopia: Secondary | ICD-10-CM | POA: Diagnosis not present

## 2021-07-22 DIAGNOSIS — H52223 Regular astigmatism, bilateral: Secondary | ICD-10-CM | POA: Diagnosis not present

## 2021-07-22 DIAGNOSIS — Z961 Presence of intraocular lens: Secondary | ICD-10-CM | POA: Diagnosis not present

## 2021-07-25 ENCOUNTER — Other Ambulatory Visit (INDEPENDENT_AMBULATORY_CARE_PROVIDER_SITE_OTHER): Payer: Self-pay | Admitting: Vascular Surgery

## 2021-07-25 DIAGNOSIS — I714 Abdominal aortic aneurysm, without rupture, unspecified: Secondary | ICD-10-CM

## 2021-07-28 ENCOUNTER — Ambulatory Visit (INDEPENDENT_AMBULATORY_CARE_PROVIDER_SITE_OTHER): Payer: Medicare HMO

## 2021-07-28 ENCOUNTER — Ambulatory Visit (INDEPENDENT_AMBULATORY_CARE_PROVIDER_SITE_OTHER): Payer: Medicare HMO | Admitting: Vascular Surgery

## 2021-07-28 ENCOUNTER — Encounter (INDEPENDENT_AMBULATORY_CARE_PROVIDER_SITE_OTHER): Payer: Self-pay

## 2021-07-28 DIAGNOSIS — I714 Abdominal aortic aneurysm, without rupture, unspecified: Secondary | ICD-10-CM

## 2021-08-04 ENCOUNTER — Encounter (INDEPENDENT_AMBULATORY_CARE_PROVIDER_SITE_OTHER): Payer: Self-pay | Admitting: Vascular Surgery

## 2021-08-08 ENCOUNTER — Encounter (INDEPENDENT_AMBULATORY_CARE_PROVIDER_SITE_OTHER): Payer: Self-pay | Admitting: *Deleted

## 2021-08-26 ENCOUNTER — Telehealth (INDEPENDENT_AMBULATORY_CARE_PROVIDER_SITE_OTHER): Payer: Self-pay | Admitting: Vascular Surgery

## 2021-08-26 NOTE — Telephone Encounter (Signed)
Patient called and stated his legs were doing good but he notice that the more her walks, his hips are hurting and when he walks Its like he is drunk.  He stated he was told if there was any change in his condition to call.  Please advise

## 2021-08-26 NOTE — Telephone Encounter (Signed)
This is likely not caused by his aortic aneurysm but it could be an indication that there is blood flow issues.  We can have the patient come in for ABIs to evaluate.  However, if these ABIs are normal he would need to discuss with his PCP for further work-up and evaluation as this may be related to musculoskeletal or lower back issues.

## 2021-08-29 ENCOUNTER — Other Ambulatory Visit (INDEPENDENT_AMBULATORY_CARE_PROVIDER_SITE_OTHER): Payer: Self-pay | Admitting: Nurse Practitioner

## 2021-08-29 DIAGNOSIS — M79604 Pain in right leg: Secondary | ICD-10-CM

## 2021-09-01 ENCOUNTER — Ambulatory Visit (INDEPENDENT_AMBULATORY_CARE_PROVIDER_SITE_OTHER): Payer: Medicare HMO | Admitting: Nurse Practitioner

## 2021-09-01 ENCOUNTER — Encounter (INDEPENDENT_AMBULATORY_CARE_PROVIDER_SITE_OTHER): Payer: Self-pay | Admitting: Nurse Practitioner

## 2021-09-01 ENCOUNTER — Ambulatory Visit (INDEPENDENT_AMBULATORY_CARE_PROVIDER_SITE_OTHER): Payer: Medicare HMO

## 2021-09-01 ENCOUNTER — Encounter (INDEPENDENT_AMBULATORY_CARE_PROVIDER_SITE_OTHER): Payer: Self-pay

## 2021-09-01 VITALS — BP 120/67 | HR 78 | Resp 16 | Wt 184.0 lb

## 2021-09-01 DIAGNOSIS — M79604 Pain in right leg: Secondary | ICD-10-CM

## 2021-09-01 DIAGNOSIS — M79605 Pain in left leg: Secondary | ICD-10-CM

## 2021-09-01 DIAGNOSIS — I723 Aneurysm of iliac artery: Secondary | ICD-10-CM | POA: Diagnosis not present

## 2021-09-01 DIAGNOSIS — I70219 Atherosclerosis of native arteries of extremities with intermittent claudication, unspecified extremity: Secondary | ICD-10-CM | POA: Diagnosis not present

## 2021-09-01 DIAGNOSIS — I7143 Infrarenal abdominal aortic aneurysm, without rupture: Secondary | ICD-10-CM | POA: Diagnosis not present

## 2021-09-03 NOTE — Progress Notes (Signed)
Subjective:    Patient ID: Garrett Moore, male    DOB: 20-Nov-1940, 81 y.o.   MRN: [id number] Chief Complaint  Patient presents with   Follow-up    Ultrasound follow up    Garrett Moore is an 81 year old male who presents today for follow-up evaluation of pain in his lower extremities.  The patient notes that with more activity he has been having hip pain and weakness.  He notes that it becomes more significant than when he walks and then it stops with rest.  The patient has a previous history of endovascular aortic aneurysm repair on 07/03/2020.  The patient measured an abdominal aortic aneurysm at 6.8 cm.  The patient also had a right common iliac artery aneurysm measuring 4 cm.  The first follow-up ultrasound after intervention showed a abdominal aortic aneurysm down to 5.03 cm with a right common iliac artery aneurysm down to 3.67 cm.  However, the patient's follow-up studies in May of this year show a maximal diameter of 5.7 cm with a right common iliac artery aneurysm of 4.1 cm.  Today, the patient underwent noninvasive studies to evaluate for possible issues with perfusion.  Today the patient's right ABI 0.75 with an ABI of 1.12 on the left.  Additional duplex shows dampened monophasic waveforms in the right common femoral artery.    Review of Systems  All other systems reviewed and are negative.      Objective:   Physical Exam Vitals reviewed.  HENT:     Head: Normocephalic.  Cardiovascular:     Rate and Rhythm: Normal rate.  Pulmonary:     Effort: Pulmonary effort is normal.  Skin:    General: Skin is warm and dry.  Neurological:     Mental Status: He is alert and oriented to person, place, and time.  Psychiatric:        Mood and Affect: Mood normal.        Behavior: Behavior normal.        Thought Content: Thought content normal.        Judgment: Judgment normal.     BP 120/67 (BP Location: Left Arm)   Pulse 78   Resp 16   Wt 184 lb (83.5 kg)   BMI 25.66 kg/m    Past Medical History:  Diagnosis Date   Aneurysm of infrarenal abdominal aorta ([place]) 05/10/2020   s/p EVAR 07/03/2020   Aortic atherosclerosis (HCC)    Aortic valvar stenosis 02/08/2018   Moderate   Atrial fibrillation (HCC)    Basal cell carcinoma    Face   BPH (benign prostatic hyperplasia)    Carotid artery stenosis    CKD (chronic kidney disease), stage III (HCC)    COPD (chronic obstructive pulmonary disease) (HCC)    Coronary artery disease    DVT (deep venous thrombosis) ([place])    age 35, leg after Tonsillectomy   Family history of adverse reaction to anesthesia    mother under anesthesia for extended period for AAA memory problems/dementia after   Grade I diastolic dysfunction 41/6384   Heart murmur    History of kidney stones    Hyperlipidemia    Myocardial infarction ([place]) 07/1983   IABP overnight then removed and nothing else.   OA (osteoarthritis)    Peripheral vascular disease (HCC)    Presence of arterial stent 07/03/2020   BILATERAL iliac arteries   Ureteral calculus, left 07/2020    Social History   Socioeconomic History   Marital status: Married  Spouse name: Garrett Moore   Number of children: 2   Years of education: Not on file   Highest education level: Not on file  Occupational History   Occupation: Freight forwarder of [employer]    Comment: retired  Tobacco Use   Smoking status: Former    Packs/day: 2.00    Years: 50.00    Total pack years: 100.00    Types: Cigarettes   Smokeless tobacco: Never   Tobacco comments:    quit 14 years ago  Vaping Use   Vaping Use: Never used  Substance and Sexual Activity   Alcohol use: Not Currently   Drug use: Never   Sexual activity: Not on file  Other Topics Concern   Not on file  Social History Narrative   Patient lives with wife in his home.  He feels safe in his home.   No stairs.    Social Determinants of Health   Financial Resource Strain: Not on file  Food Insecurity: Not on file   Transportation Needs: Not on file  Physical Activity: Not on file  Stress: Not on file  Social Connections: Not on file  Intimate Partner Violence: Not on file    Past Surgical History:  Procedure Laterality Date   ABDOMINAL AORTIC ANEURYSM REPAIR     CARDIAC CATHETERIZATION  08/14/1983   CARDIOVASCULAR STRESS TEST  01/23/2011   normal myocardial perfusion scan demonstrating an attenuation artifact in the inferior region of the myocardium. no ischemia or infarct/scar seen in remaining myocardium   CAROTID DUPLEX  05/21/2006   right bulb-0-49% diameter reduction; left bulb and proximal ICA-0-49% diameter reduction   CATARACT EXTRACTION, BILATERAL  01/2019   CORONARY ANGIOPLASTY  07/1983   PTCA Prox RCA   CYSTOSCOPY W/ RETROGRADES  08/12/2020   Procedure: CYSTOSCOPY WITH RETROGRADE PYELOGRAM;  Surgeon: Hollice Espy, MD;  Location: [facility];  Service: Urology;;   CYSTOSCOPY/URETEROSCOPY/HOLMIUM LASER/STENT PLACEMENT Left 08/12/2020   Procedure: CYSTOSCOPY/URETEROSCOPY/HOLMIUM LASER/STENT PLACEMENT;  Surgeon: Hollice Espy, MD;  Location: [facility];  Service: Urology;  Laterality: Left;   DOPPLER ECHOCARDIOGRAPHY  07/21/2012   EF 50-55%, aortic valve noncoronary cusp mobility was severely restricted.   ENDOVASCULAR REPAIR/STENT GRAFT N/A 07/03/2020   Procedure: ENDOVASCULAR REPAIR/STENT GRAFT;  Surgeon: Katha Cabal, MD;  Location: [facility];  Service: Cardiovascular;  Laterality: N/A;   EYE SURGERY Bilateral    cataracts   ROTATOR CUFF REPAIR Left    TEMPORARY PACEMAKER  07/1983   TONSILLECTOMY     TOTAL HIP ARTHROPLASTY Left 02/21/2019   Procedure: TOTAL HIP ARTHROPLASTY ANTERIOR APPROACH;  Surgeon: Paralee Cancel, MD;  Location: [facility];  Service: Orthopedics;  Laterality: Left;  70 mins   VITRECTOMY Bilateral     Family History  Problem Relation Age of Onset   Heart Problems Mother    AAA (abdominal aortic aneurysm) Mother     Allergies  Allergen Reactions    Quinolones Other (See Comments)    Other reaction(s): Has aortic root dilatation       Latest Ref Rng & Units 08/28/2020    6:01 PM 08/14/2020    6:12 PM 07/04/2020    3:31 AM  CBC  WBC 4.0 - 10.5 K/uL 18.9  15.2  16.8   Hemoglobin 13.0 - 17.0 g/dL 10.8  11.0  11.7   Hematocrit 39.0 - 52.0 % 33.5  32.9  34.9   Platelets 150 - 400 K/uL 313  329  234       CMP  Component Value Date/Time   NA 134 (L) 08/28/2020 1801   K 3.7 08/28/2020 1801   CL 104 08/28/2020 1801   CO2 23 08/28/2020 1801   GLUCOSE 131 (H) 08/28/2020 1801   BUN 14 08/28/2020 1801   CREATININE 1.41 (H) 08/28/2020 1801   CALCIUM 8.2 (L) 08/28/2020 1801   PROT 7.2 08/14/2020 1812   ALBUMIN 3.7 08/14/2020 1812   AST 31 08/14/2020 1812   ALT 13 08/14/2020 1812   ALKPHOS 67 08/14/2020 1812   BILITOT 0.9 08/14/2020 1812   GFRNONAA 51 (L) 08/28/2020 1801   GFRAA >60 02/22/2019 0236     VAS [place] ABI WITH/WO TBI  Result Date: 09/01/2021  LOWER EXTREMITY DOPPLER STUDY Patient Name:  DAMOND BORCHERS  Date of Exam:   09/01/2021 Medical Rec #: [id number]        Accession #:    [id number] Date of Birth: 1940/06/02        Patient Gender: M Patient Age:   49 years Exam Location:  [facility] Procedure:      VAS [place] ABI WITH/WO TBI Referring Phys: Eulogio Ditch --------------------------------------------------------------------------------  Indications: Claudication, and both hips rt > Lt.  Vascular Interventions: 07/03/20: EVAR with bifurcated right EIA segement & left                         EIA extender;                         Bilat CIA aneurysms with old occluded right CIA stent. Comparison Study: none Performing Technologist: Concha Norway RVT  Examination Guidelines: A complete evaluation includes at minimum, Doppler waveform signals and systolic blood pressure reading at the level of bilateral brachial, anterior tibial, and posterior tibial arteries, when vessel segments are accessible. Bilateral testing is  considered an integral part of a complete examination. Photoelectric Plethysmograph (PPG) waveforms and toe systolic pressure readings are included as required and additional duplex testing as needed. Limited examinations for reoccurring indications may be performed as noted.  ABI Findings: +---------+------------------+-----+-------------------+--------+ Right    Rt Pressure (mmHg)IndexWaveform           Comment  +---------+------------------+-----+-------------------+--------+ Brachial 119                                                +---------+------------------+-----+-------------------+--------+ ATA      90                0.75 monophasic                  +---------+------------------+-----+-------------------+--------+ PTA      73                0.61 dampened monophasic         +---------+------------------+-----+-------------------+--------+ Great Toe65                0.54 Abnormal                    +---------+------------------+-----+-------------------+--------+ +---------+------------------+-----+---------+-------+ Left     Lt Pressure (mmHg)IndexWaveform Comment +---------+------------------+-----+---------+-------+ Brachial 120                                     +---------+------------------+-----+---------+-------+ ATA  98                0.82 biphasic         +---------+------------------+-----+---------+-------+ PTA      134               1.12 triphasic        +---------+------------------+-----+---------+-------+ Great Toe83                0.69 Normal           +---------+------------------+-----+---------+-------+ Added duplex left CFA shows strong biphasic flow at 125cm/s.  Summary: Right: Resting right ankle-brachial index indicates moderate right lower extremity arterial disease. The right toe-brachial index is abnormal. Added duplex right CFA shows dampened monophasic flow at 37cm/s. Left: Resting left ankle-brachial index is  within normal range. No evidence of significant left lower extremity arterial disease. The left toe-brachial index is normal. *See table(s) above for measurements and observations.  Electronically signed by [name] MD on 09/01/2021 at 5:22:24 PM.    Final        Assessment & Plan:   1. Infrarenal abdominal aortic aneurysm (AAA) without rupture [facility]) The patient has is an abrupt change in size from his study in November to the study in May.  This is concerning that there could be a possible endoleak.  Prior to intervention on the right lower extremity, we will have the patient undergo a CT scan to evaluate for possible endoleak which may need intervention prior to the right lower extremity angiogram.  2. Iliac artery aneurysm, bilateral ([place]) There is also noted growth in the right common iliac artery aneurysm, this will be evaluated as noted above.  3. Intermittent claudication due to atherosclerosis of artery of extremity (HCC) Recommend:  The patient has experienced increased claudication symptoms and is now describing lifestyle limiting claudication and appears to be having mild rest pain symptroms.  Given the severity of the patient's severe right lower extremity symptoms the patient should undergo angiography with the hope for intervention.  Risk and benefits were reviewed the patient.  Indications for the procedure were reviewed.  All questions were answered, the patient agrees to proceed with right lower extremity angiography and possible intervention.   The patient should continue walking and begin a more formal exercise program.  The patient should continue antiplatelet therapy and aggressive treatment of the lipid abnormalities  The patient will follow up with me after the angiogram.   - VAS [place] ABI WITH/WO TBI   Current Outpatient Medications on File Prior to Visit  Medication Sig Dispense Refill   albuterol (VENTOLIN HFA) 108 (90 Base) MCG/ACT inhaler as needed.      aspirin EC 81 MG tablet Take 81 mg by mouth daily. Swallow whole.     Multiple Vitamin (MULTIVITAMIN WITH MINERALS) TABS tablet Take 1 tablet by mouth daily.     nicotine polacrilex (COMMIT) 2 MG lozenge Take 2 mg by mouth as needed for smoking cessation.     simvastatin (ZOCOR) 80 MG tablet Take 80 mg by mouth daily at 6 PM.      tamsulosin (FLOMAX) 0.4 MG CAPS capsule Take 1 capsule (0.4 mg total) by mouth daily. (Patient taking differently: Take 0.8 mg by mouth daily.) 90 capsule 3   acetaminophen (TYLENOL) 500 MG tablet Take 500 mg by mouth every 6 (six) hours as needed.     clopidogrel (PLAVIX) 75 MG tablet Take 1 tablet (75 mg total) by mouth daily at 6 (six) AM. (Patient not  taking: Reported on 05/30/2021) 90 tablet 3   Current Facility-Administered Medications on File Prior to Visit  Medication Dose Route Frequency Provider Last Rate Last Admin   cefTRIAXone (ROCEPHIN) injection 1 g  1 g Intramuscular Q24H Hollice Espy, MD   1 g at 08/21/20 1128    There are no Patient Instructions on file for this visit. No follow-ups on file.   Kris Hartmann, NP

## 2021-09-09 ENCOUNTER — Telehealth (INDEPENDENT_AMBULATORY_CARE_PROVIDER_SITE_OTHER): Payer: Self-pay | Admitting: Nurse Practitioner

## 2021-09-09 ENCOUNTER — Other Ambulatory Visit (INDEPENDENT_AMBULATORY_CARE_PROVIDER_SITE_OTHER): Payer: Self-pay | Admitting: Nurse Practitioner

## 2021-09-09 DIAGNOSIS — I7143 Infrarenal abdominal aortic aneurysm, without rupture: Secondary | ICD-10-CM

## 2021-09-09 NOTE — Telephone Encounter (Signed)
Order entered

## 2021-09-09 NOTE — Telephone Encounter (Signed)
Patient called in stating he was supposed to have orders done to get an CT done. Was told that if he hadn't heard anything in a week to call us back. Let patient know I was leaving a message with the provider    please call and advise

## 2021-09-10 NOTE — Telephone Encounter (Signed)
Called pt and let him know that the order was placed for CT and he could contact radiology to schedule an appt and then contact us to make an appt to see the dr after the CT scan for results.  He stated verbal understanding

## 2021-09-10 NOTE — Telephone Encounter (Signed)
Patient has been scheduled

## 2021-09-17 ENCOUNTER — Ambulatory Visit: Admission: RE | Admit: 2021-09-17 | Payer: Medicare HMO | Source: Ambulatory Visit

## 2021-09-17 ENCOUNTER — Ambulatory Visit: Payer: Self-pay | Admitting: Urology

## 2021-09-24 ENCOUNTER — Ambulatory Visit
Admission: RE | Admit: 2021-09-24 | Discharge: 2021-09-24 | Disposition: A | Discharge status: Home / Self Care | Payer: Medicare HMO | Source: Ambulatory Visit | Attending: Nurse Practitioner | Admitting: Nurse Practitioner

## 2021-09-24 DIAGNOSIS — I7143 Infrarenal abdominal aortic aneurysm, without rupture: Secondary | ICD-10-CM | POA: Insufficient documentation

## 2021-09-24 LAB — POCT I-STAT CREATININE: Creatinine, Ser: 1.5 mg/dL — ABNORMAL HIGH (ref 0.61–1.24)

## 2021-09-24 MED ORDER — IOHEXOL 350 MG/ML SOLN
100.0000 mL | Freq: Once | INTRAVENOUS | Status: AC | PRN
  Administered 2021-09-24: 100 mL via INTRAVENOUS

## 2021-09-25 ENCOUNTER — Ambulatory Visit (INDEPENDENT_AMBULATORY_CARE_PROVIDER_SITE_OTHER): Payer: Medicare HMO | Admitting: Vascular Surgery

## 2021-09-25 ENCOUNTER — Encounter (INDEPENDENT_AMBULATORY_CARE_PROVIDER_SITE_OTHER): Payer: Self-pay | Admitting: Vascular Surgery

## 2021-09-25 VITALS — BP 131/69 | HR 82 | Resp 17 | Ht 71.0 in | Wt 183.6 lb

## 2021-09-25 DIAGNOSIS — I7143 Infrarenal abdominal aortic aneurysm, without rupture: Secondary | ICD-10-CM

## 2021-09-25 DIAGNOSIS — I70211 Atherosclerosis of native arteries of extremities with intermittent claudication, right leg: Secondary | ICD-10-CM

## 2021-09-25 DIAGNOSIS — J449 Chronic obstructive pulmonary disease, unspecified: Secondary | ICD-10-CM

## 2021-09-25 DIAGNOSIS — I2510 Atherosclerotic heart disease of native coronary artery without angina pectoris: Secondary | ICD-10-CM | POA: Diagnosis not present

## 2021-09-25 DIAGNOSIS — E782 Mixed hyperlipidemia: Secondary | ICD-10-CM | POA: Diagnosis not present

## 2021-09-28 ENCOUNTER — Encounter (INDEPENDENT_AMBULATORY_CARE_PROVIDER_SITE_OTHER): Payer: Self-pay | Admitting: Vascular Surgery

## 2021-09-28 DIAGNOSIS — I70219 Atherosclerosis of native arteries of extremities with intermittent claudication, unspecified extremity: Secondary | ICD-10-CM | POA: Insufficient documentation

## 2021-09-28 NOTE — Progress Notes (Signed)
MRN : [id number]  Garrett Moore is a 81 y.o. (30-Oct-1940) male who presents with chief complaint of check circulation.  History of Present Illness:   The patient is seen for follow up evaluation of AAA status post CTA. There were no problems or complications related to the CT scan. The patient denies interval development of abdominal or back pain. No new lower extremity pain or discoloration of the toes.   His biggest c/o today is increased claudication of the right leg and increased pain in his right leg.  The patient denies recent episodes of angina or shortness of breath. The patient denies interval anaurosis fugax. There is no recent history of TIA symptoms or focal motor deficits.   CT angiography of the abdomen and pelvis shows an infrarenal AAA/iliac artery aneurysms s/p endovascular repair.  The stent is in good position and there sac size is stable, No endoleaks noted.  However, the external iliac limb of the iliac branch stent is thrombosed.   Current Meds  Medication Sig   aspirin EC 81 MG tablet Take 81 mg by mouth daily. Swallow whole.   Multiple Vitamin (MULTIVITAMIN WITH MINERALS) TABS tablet Take 1 tablet by mouth daily.   nicotine polacrilex (COMMIT) 2 MG lozenge Take 2 mg by mouth as needed for smoking cessation.   simvastatin (ZOCOR) 80 MG tablet Take 80 mg by mouth daily at 6 PM.    tamsulosin (FLOMAX) 0.4 MG CAPS capsule Take 1 capsule (0.4 mg total) by mouth daily. (Patient taking differently: Take 0.8 mg by mouth daily.)   Current Facility-Administered Medications for the 09/25/21 encounter (Office Visit) with Delana Meyer, Dolores Lory, MD  Medication   cefTRIAXone (ROCEPHIN) injection 1 g    Past Medical History:  Diagnosis Date   Aneurysm of infrarenal abdominal aorta ([place]) 05/10/2020   s/p EVAR 07/03/2020   Aortic atherosclerosis ([place])    Aortic valvar stenosis 02/08/2018   Moderate   Atrial fibrillation (HCC)    Basal cell carcinoma    Face   BPH  (benign prostatic hyperplasia)    Carotid artery stenosis    CKD (chronic kidney disease), stage III (HCC)    COPD (chronic obstructive pulmonary disease) ([place])    Coronary artery disease    DVT (deep venous thrombosis) ([place])    age 81, leg after Tonsillectomy   Family history of adverse reaction to anesthesia    mother under anesthesia for extended period for AAA memory problems/dementia after   Grade I diastolic dysfunction 15/1761   Heart murmur    History of kidney stones    Hyperlipidemia    Myocardial infarction ([place]) 07/1983   IABP overnight then removed and nothing else.   OA (osteoarthritis)    Peripheral vascular disease (HCC)    Presence of arterial stent 07/03/2020   BILATERAL iliac arteries   Ureteral calculus, left 07/2020    Past Surgical History:  Procedure Laterality Date   ABDOMINAL AORTIC ANEURYSM REPAIR     CARDIAC CATHETERIZATION  08/14/1983   CARDIOVASCULAR STRESS TEST  01/23/2011   normal myocardial perfusion scan demonstrating an attenuation artifact in the inferior region of the myocardium. no ischemia or infarct/scar seen in remaining myocardium   CAROTID DUPLEX  05/21/2006   right bulb-0-49% diameter reduction; left bulb and proximal ICA-0-49% diameter reduction   CATARACT EXTRACTION, BILATERAL  01/2019   CORONARY ANGIOPLASTY  07/1983   PTCA Prox RCA   CYSTOSCOPY W/ RETROGRADES  08/12/2020   Procedure: CYSTOSCOPY WITH RETROGRADE PYELOGRAM;  Surgeon: Hollice Espy, MD;  Location: [facility];  Service: Urology;;   CYSTOSCOPY/URETEROSCOPY/HOLMIUM LASER/STENT PLACEMENT Left 08/12/2020   Procedure: CYSTOSCOPY/URETEROSCOPY/HOLMIUM LASER/STENT PLACEMENT;  Surgeon: Hollice Espy, MD;  Location: [facility];  Service: Urology;  Laterality: Left;   DOPPLER ECHOCARDIOGRAPHY  07/21/2012   EF 50-55%, aortic valve noncoronary cusp mobility was severely restricted.   ENDOVASCULAR REPAIR/STENT GRAFT N/A 07/03/2020   Procedure: ENDOVASCULAR REPAIR/STENT GRAFT;  Surgeon:  Katha Cabal, MD;  Location: [facility];  Service: Cardiovascular;  Laterality: N/A;   EYE SURGERY Bilateral    cataracts   ROTATOR CUFF REPAIR Left    TEMPORARY PACEMAKER  07/1983   TONSILLECTOMY     TOTAL HIP ARTHROPLASTY Left 02/21/2019   Procedure: TOTAL HIP ARTHROPLASTY ANTERIOR APPROACH;  Surgeon: Paralee Cancel, MD;  Location: [facility];  Service: Orthopedics;  Laterality: Left;  70 mins   VITRECTOMY Bilateral     Social History Social History   Tobacco Use   Smoking status: Former    Packs/day: 2.00    Years: 50.00    Total pack years: 100.00    Types: Cigarettes   Smokeless tobacco: Never   Tobacco comments:    quit 14 years ago  Vaping Use   Vaping Use: Never used  Substance Use Topics   Alcohol use: Not Currently   Drug use: Never    Family History Family History  Problem Relation Age of Onset   Heart Problems Mother    AAA (abdominal aortic aneurysm) Mother     Allergies  Allergen Reactions   Quinolones Other (See Comments)    Other reaction(s): Has aortic root dilatation     REVIEW OF SYSTEMS (Negative unless checked)  Constitutional: '[]'$ Weight loss  '[]'$ Fever  '[]'$ Chills Cardiac: '[]'$ Chest pain   '[]'$ Chest pressure   '[]'$ Palpitations   '[]'$ Shortness of breath when laying flat   '[]'$ Shortness of breath with exertion. Vascular:  '[x]'$ Pain in legs with walking   '[]'$ Pain in legs at rest  '[]'$ History of DVT   '[]'$ Phlebitis   '[]'$ Swelling in legs   '[]'$ Varicose veins   '[]'$ Non-healing ulcers Pulmonary:   '[]'$ Uses home oxygen   '[]'$ Productive cough   '[]'$ Hemoptysis   '[]'$ Wheeze  '[]'$ COPD   '[]'$ Asthma Neurologic:  '[]'$ Dizziness   '[]'$ Seizures   '[]'$ History of stroke   '[]'$ History of TIA  '[]'$ Aphasia   '[]'$ Vissual changes   '[]'$ Weakness or numbness in arm   '[]'$ Weakness or numbness in leg Musculoskeletal:   '[]'$ Joint swelling   '[]'$ Joint pain   '[]'$ Low back pain Hematologic:  '[]'$ Easy bruising  '[]'$ Easy bleeding   '[]'$ Hypercoagulable state   '[]'$ Anemic Gastrointestinal:  '[]'$ Diarrhea   '[]'$ Vomiting  '[]'$ Gastroesophageal  reflux/heartburn   '[]'$ Difficulty swallowing. Genitourinary:  '[]'$ Chronic kidney disease   '[]'$ Difficult urination  '[]'$ Frequent urination   '[]'$ Blood in urine Skin:  '[]'$ Rashes   '[]'$ Ulcers  Psychological:  '[]'$ History of anxiety   '[]'$  History of major depression.  Physical Examination  Vitals:   09/25/21 0900  BP: 131/69  Pulse: 82  Resp: 17  Weight: 183 lb 9.6 oz (83.3 kg)  Height: '5\' 11"'$  (1.803 m)   Body mass index is 25.61 kg/m. Gen: WD/WN, NAD Head: [place]/AT, No temporalis wasting.  Ear/Nose/Throat: Hearing grossly intact, nares w/o erythema or drainage Eyes: PER, EOMI, sclera nonicteric.  Neck: Supple, no masses.  No bruit or JVD.  Pulmonary:  Good air movement, no audible wheezing, no use of accessory muscles.  Cardiac: RRR, normal S1, S2, no Murmurs. Vascular:   Vessel Right Left  Radial Palpable Palpable  PT  Not Palpable Palpable  DP Not Palpable  Palpable  Gastrointestinal: soft, non-distended. No guarding/no peritoneal signs.  Musculoskeletal: M/S 5/5 throughout.  No visible deformity.  Neurologic: CN 2-12 intact. Pain and light touch intact in extremities.  Symmetrical.  Speech is fluent. Motor exam as listed above. Psychiatric: Judgment intact, Mood & affect appropriate for pt's clinical situation. Dermatologic: No rashes or ulcers noted.  No changes consistent with cellulitis.   CBC Lab Results  Component Value Date   WBC 18.9 (H) 08/28/2020   HGB 10.8 (L) 08/28/2020   HCT 33.5 (L) 08/28/2020   MCV 96.0 08/28/2020   PLT 313 08/28/2020    BMET    Component Value Date/Time   NA 134 (L) 08/28/2020 1801   K 3.7 08/28/2020 1801   CL 104 08/28/2020 1801   CO2 23 08/28/2020 1801   GLUCOSE 131 (H) 08/28/2020 1801   BUN 14 08/28/2020 1801   CREATININE 1.50 (H) 09/24/2021 0803   CALCIUM 8.2 (L) 08/28/2020 1801   GFRNONAA 51 (L) 08/28/2020 1801   GFRAA >60 02/22/2019 0236   Estimated Creatinine Clearance: 41.8 mL/min (A) (by C-G formula based on SCr of 1.5 mg/dL  (H)).  COAG Lab Results  Component Value Date   INR 1.1 06/20/2020    Radiology CT Angio Abd/Pel w/ and/or w/o  Result Date: 09/24/2021 CLINICAL DATA:  Infrarenal abdominal aortic aneurysm without rupture Abrupt change in size on of amber examination, concerning for endoleak Right common iliac artery aneurysms Intermittent claudication EXAM: CTA ABDOMEN AND PELVIS WITHOUT AND WITH CONTRAST TECHNIQUE: Multidetector CT imaging of the abdomen and pelvis was performed using the standard protocol during bolus administration of intravenous contrast. Multiplanar reconstructed images and MIPs were obtained and reviewed to evaluate the vascular anatomy. RADIATION DOSE REDUCTION: This exam was performed according to the departmental dose-optimization program which includes automated exposure control, adjustment of the mA and/or kV according to patient size and/or use of iterative reconstruction technique. CONTRAST:  131m OMNIPAQUE IOHEXOL 350 MG/ML SOLN COMPARISON:  CT abdomen pelvis 08/14/2020 CT abdomen pelvis 04/30/2020 FINDINGS: VASCULAR Aorta: Abdominal aortic aneurysm status post endograft repair again seen. There is no evidence endoleak. Maximum diameter of the aneurysm sac measures 6.0 cm which is not significantly changed compared to CT from 04/30/2020. Celiac: Mild smooth narrowing of the origin.  Otherwise patent. SMA: No significant abnormality. Renals: Moderate stenosis at the origin of the left main renal artery. No significant stenosis of the right main renal artery. IMA: Occluded at the origin. Opacification of distal branches indicative of retrograde collateral flow. Inflow: Right common iliac artery aneurysm measures 4.9 x 4.7 cm which is not significantly changed since 08/14/2020 where it measured approximately 4.9 x 4.7 cm. The stents extend into the right internal and external iliac arteries. The internal iliac artery stent is patent, however the external iliac artery stent is occluded. Left  common iliac artery aneurysm measures 3.2 x 3.1 cm which is unchanged from prior exam. Left internal iliac artery is occluded at the origin. Opacification of distal branches indicative of retrograde collateral flow. No significant narrowing of the external iliac artery. Proximal Outflow: Flow reconstitutes in the right common femoral artery. There is moderate stenosis of the mid right common femoral artery due to calcified plaque. There is moderate to severe stenosis of the origin of the right superficial femoral artery. There is complete to near complete occlusion at the origin of the right profundus femoris artery. Mild narrowing of the left common femoral artery due to calcified  plaque. Visualized segments of the left superficial femoral and profunda femoris arteries are patent without significant stenosis. Veins: No obvious venous abnormality within the limitations of this arterial phase study. Review of the MIP images confirms the above findings. NON-VASCULAR Lower chest: No acute abnormality. Hepatobiliary: No focal liver abnormality is seen. No gallstones, gallbladder wall thickening, or biliary dilatation. Pancreas: Unremarkable. No pancreatic ductal dilatation or surrounding inflammatory changes. Spleen: Normal in size without focal abnormality. Adrenals/Urinary Tract: Adrenal glands are normal. Mild diffuse thinning of renal cortices. No hydronephrosis or hydroureter. Two simple left renal cysts are seen measuring up to 2.2 cm. Evaluation the bladder is limited due to streak artifact from left total hip prosthesis. No discrete abnormality is identified. Stomach/Bowel: Small duodenal diverticulum. Diffuse colonic diverticulosis, greatest at the sigmoid. No evidence of acute diverticulitis. Lymphatic: No enlarged abdominal or pelvic lymph nodes. Reproductive: Moderately enlarged prostate. Other: No abdominal wall hernia or abnormality. No abdominopelvic ascites. Musculoskeletal: No acute or significant  osseous findings. IMPRESSION: VASCULAR 1. Infrarenal abdominal aortic aneurysm status post endograft repair. Maximum diameter of the aneurysm sac measures 6.0 cm which is not significantly changed when compared to examination from 04/30/2020. (The patient is currently under the care of a vascular specialist/surgeon). 2. Unchanged right common iliac artery aneurysm measuring up to 4.9 cm. 3. Unchanged left common iliac artery aneurysm measuring up to 3.2 cm. 4. Occluded right external and common iliac arteries with reconstitution of flow in the right common femoral artery. 5. High-grade stenosis at the origin of the right profundus femoris artery. NON-VASCULAR 1. Colonic diverticulosis. 2. Moderately enlarged prostate. Electronically Signed   By: Miachel Roux M.D.   On: 09/24/2021 12:35   VAS [place] ABI WITH/WO TBI  Result Date: 09/01/2021  LOWER EXTREMITY DOPPLER STUDY Patient Name:  Garrett Moore  Date of Exam:   09/01/2021 Medical Rec #: [id number]        Accession #:    [id number] Date of Birth: 19-Jul-1940        Patient Gender: M Patient Age:   66 years Exam Location:  [facility] Procedure:      VAS [place] ABI WITH/WO TBI Referring Phys: Eulogio Ditch --------------------------------------------------------------------------------  Indications: Claudication, and both hips rt > Lt.  Vascular Interventions: 07/03/20: EVAR with bifurcated right EIA segement & left                         EIA extender;                         Bilat CIA aneurysms with old occluded right CIA stent. Comparison Study: none Performing Technologist: Concha Norway RVT  Examination Guidelines: A complete evaluation includes at minimum, Doppler waveform signals and systolic blood pressure reading at the level of bilateral brachial, anterior tibial, and posterior tibial arteries, when vessel segments are accessible. Bilateral testing is considered an integral part of a complete examination. Photoelectric Plethysmograph (PPG) waveforms  and toe systolic pressure readings are included as required and additional duplex testing as needed. Limited examinations for reoccurring indications may be performed as noted.  ABI Findings: +---------+------------------+-----+-------------------+--------+ Right    Rt Pressure (mmHg)IndexWaveform           Comment  +---------+------------------+-----+-------------------+--------+ Brachial 119                                                +---------+------------------+-----+-------------------+--------+  ATA      90                0.75 monophasic                  +---------+------------------+-----+-------------------+--------+ PTA      73                0.61 dampened monophasic         +---------+------------------+-----+-------------------+--------+ Great Toe65                0.54 Abnormal                    +---------+------------------+-----+-------------------+--------+ +---------+------------------+-----+---------+-------+ Left     Lt Pressure (mmHg)IndexWaveform Comment +---------+------------------+-----+---------+-------+ Brachial 120                                     +---------+------------------+-----+---------+-------+ ATA      98                0.82 biphasic         +---------+------------------+-----+---------+-------+ PTA      134               1.12 triphasic        +---------+------------------+-----+---------+-------+ Esau Grew                0.69 Normal           +---------+------------------+-----+---------+-------+ Added duplex left CFA shows strong biphasic flow at 125cm/s.  Summary: Right: Resting right ankle-brachial index indicates moderate right lower extremity arterial disease. The right toe-brachial index is abnormal. Added duplex right CFA shows dampened monophasic flow at 37cm/s. Left: Resting left ankle-brachial index is within normal range. No evidence of significant left lower extremity arterial disease. The left  toe-brachial index is normal. *See table(s) above for measurements and observations.  Electronically signed by [name] MD on 09/01/2021 at 5:22:24 PM.    Final      Assessment/Plan 1. Infrarenal abdominal aortic aneurysm (AAA) without rupture (HCC) Recommend:  The patient has evidence of thrombosis of the right external iliac stent resulting in right lower extremity rest pain that is associated with preulcerative changes and impending tissue loss of the right foot.  This represents a limb threatening ischemia and places the patient at the risk for right limb loss.  Patient should undergo angiography of the right lower extremity with the hope for intervention for limb salvage.  The risks and benefits as well as the alternative therapies was discussed in detail with the patient.  All questions were answered.  Patient agrees to proceed with right lower extremity angiography.  The patient will follow up with me in the office after the procedure.   2. Atherosclerosis of native artery of right lower extremity with intermittent claudication ([place]) See #1  3. Atherosclerosis of native coronary artery of native heart without angina pectoris Continue cardiac and antihypertensive medications as already ordered and reviewed, no changes at this time.  Continue statin as ordered and reviewed, no changes at this time  Nitrates PRN for chest pain   4. Chronic obstructive pulmonary disease, unspecified COPD type ([place]) Continue pulmonary medications and aerosols as already ordered, these medications have been reviewed and there are no changes at this time.    5. Mixed hyperlipidemia Continue statin as ordered and reviewed, no changes at this time     [name], MD  09/28/2021 1:51 PM

## 2021-09-28 NOTE — H&P (View-Only) (Signed)
MRN : [id number]  Garrett Moore is a 81 y.o. (Oct 17, 1940) male who presents with chief complaint of check circulation.  History of Present Illness:   The patient is seen for follow up evaluation of AAA status post CTA. There were no problems or complications related to the CT scan. The patient denies interval development of abdominal or back pain. No new lower extremity pain or discoloration of the toes.   His biggest c/o today is increased claudication of the right leg and increased pain in his right leg.  The patient denies recent episodes of angina or shortness of breath. The patient denies interval anaurosis fugax. There is no recent history of TIA symptoms or focal motor deficits.   CT angiography of the abdomen and pelvis shows an infrarenal AAA/iliac artery aneurysms s/p endovascular repair.  The stent is in good position and there sac size is stable, No endoleaks noted.  However, the external iliac limb of the iliac branch stent is thrombosed.   Current Meds  Medication Sig   aspirin EC 81 MG tablet Take 81 mg by mouth daily. Swallow whole.   Multiple Vitamin (MULTIVITAMIN WITH MINERALS) TABS tablet Take 1 tablet by mouth daily.   nicotine polacrilex (COMMIT) 2 MG lozenge Take 2 mg by mouth as needed for smoking cessation.   simvastatin (ZOCOR) 80 MG tablet Take 80 mg by mouth daily at 6 PM.    tamsulosin (FLOMAX) 0.4 MG CAPS capsule Take 1 capsule (0.4 mg total) by mouth daily. (Patient taking differently: Take 0.8 mg by mouth daily.)   Current Facility-Administered Medications for the 09/25/21 encounter (Office Visit) with Delana Meyer, Dolores Lory, MD  Medication   cefTRIAXone (ROCEPHIN) injection 1 g    Past Medical History:  Diagnosis Date   Aneurysm of infrarenal abdominal aorta ([place]) 05/10/2020   s/p EVAR 07/03/2020   Aortic atherosclerosis ([place])    Aortic valvar stenosis 02/08/2018   Moderate   Atrial fibrillation (HCC)    Basal cell carcinoma    Face   BPH  (benign prostatic hyperplasia)    Carotid artery stenosis    CKD (chronic kidney disease), stage III (HCC)    COPD (chronic obstructive pulmonary disease) ([place])    Coronary artery disease    DVT (deep venous thrombosis) ([place])    age 43, leg after Tonsillectomy   Family history of adverse reaction to anesthesia    mother under anesthesia for extended period for AAA memory problems/dementia after   Grade I diastolic dysfunction 28/4132   Heart murmur    History of kidney stones    Hyperlipidemia    Myocardial infarction ([place]) 07/1983   IABP overnight then removed and nothing else.   OA (osteoarthritis)    Peripheral vascular disease (HCC)    Presence of arterial stent 07/03/2020   BILATERAL iliac arteries   Ureteral calculus, left 07/2020    Past Surgical History:  Procedure Laterality Date   ABDOMINAL AORTIC ANEURYSM REPAIR     CARDIAC CATHETERIZATION  08/14/1983   CARDIOVASCULAR STRESS TEST  01/23/2011   normal myocardial perfusion scan demonstrating an attenuation artifact in the inferior region of the myocardium. no ischemia or infarct/scar seen in remaining myocardium   CAROTID DUPLEX  05/21/2006   right bulb-0-49% diameter reduction; left bulb and proximal ICA-0-49% diameter reduction   CATARACT EXTRACTION, BILATERAL  01/2019   CORONARY ANGIOPLASTY  07/1983   PTCA Prox RCA   CYSTOSCOPY W/ RETROGRADES  08/12/2020   Procedure: CYSTOSCOPY WITH RETROGRADE PYELOGRAM;  Surgeon: Hollice Espy, MD;  Location: [facility];  Service: Urology;;   CYSTOSCOPY/URETEROSCOPY/HOLMIUM LASER/STENT PLACEMENT Left 08/12/2020   Procedure: CYSTOSCOPY/URETEROSCOPY/HOLMIUM LASER/STENT PLACEMENT;  Surgeon: Hollice Espy, MD;  Location: [facility];  Service: Urology;  Laterality: Left;   DOPPLER ECHOCARDIOGRAPHY  07/21/2012   EF 50-55%, aortic valve noncoronary cusp mobility was severely restricted.   ENDOVASCULAR REPAIR/STENT GRAFT N/A 07/03/2020   Procedure: ENDOVASCULAR REPAIR/STENT GRAFT;  Surgeon:  Katha Cabal, MD;  Location: [facility];  Service: Cardiovascular;  Laterality: N/A;   EYE SURGERY Bilateral    cataracts   ROTATOR CUFF REPAIR Left    TEMPORARY PACEMAKER  07/1983   TONSILLECTOMY     TOTAL HIP ARTHROPLASTY Left 02/21/2019   Procedure: TOTAL HIP ARTHROPLASTY ANTERIOR APPROACH;  Surgeon: Paralee Cancel, MD;  Location: [facility];  Service: Orthopedics;  Laterality: Left;  70 mins   VITRECTOMY Bilateral     Social History Social History   Tobacco Use   Smoking status: Former    Packs/day: 2.00    Years: 50.00    Total pack years: 100.00    Types: Cigarettes   Smokeless tobacco: Never   Tobacco comments:    quit 14 years ago  Vaping Use   Vaping Use: Never used  Substance Use Topics   Alcohol use: Not Currently   Drug use: Never    Family History Family History  Problem Relation Age of Onset   Heart Problems Mother    AAA (abdominal aortic aneurysm) Mother     Allergies  Allergen Reactions   Quinolones Other (See Comments)    Other reaction(s): Has aortic root dilatation     REVIEW OF SYSTEMS (Negative unless checked)  Constitutional: '[]'$ Weight loss  '[]'$ Fever  '[]'$ Chills Cardiac: '[]'$ Chest pain   '[]'$ Chest pressure   '[]'$ Palpitations   '[]'$ Shortness of breath when laying flat   '[]'$ Shortness of breath with exertion. Vascular:  '[x]'$ Pain in legs with walking   '[]'$ Pain in legs at rest  '[]'$ History of DVT   '[]'$ Phlebitis   '[]'$ Swelling in legs   '[]'$ Varicose veins   '[]'$ Non-healing ulcers Pulmonary:   '[]'$ Uses home oxygen   '[]'$ Productive cough   '[]'$ Hemoptysis   '[]'$ Wheeze  '[]'$ COPD   '[]'$ Asthma Neurologic:  '[]'$ Dizziness   '[]'$ Seizures   '[]'$ History of stroke   '[]'$ History of TIA  '[]'$ Aphasia   '[]'$ Vissual changes   '[]'$ Weakness or numbness in arm   '[]'$ Weakness or numbness in leg Musculoskeletal:   '[]'$ Joint swelling   '[]'$ Joint pain   '[]'$ Low back pain Hematologic:  '[]'$ Easy bruising  '[]'$ Easy bleeding   '[]'$ Hypercoagulable state   '[]'$ Anemic Gastrointestinal:  '[]'$ Diarrhea   '[]'$ Vomiting  '[]'$ Gastroesophageal  reflux/heartburn   '[]'$ Difficulty swallowing. Genitourinary:  '[]'$ Chronic kidney disease   '[]'$ Difficult urination  '[]'$ Frequent urination   '[]'$ Blood in urine Skin:  '[]'$ Rashes   '[]'$ Ulcers  Psychological:  '[]'$ History of anxiety   '[]'$  History of major depression.  Physical Examination  Vitals:   09/25/21 0900  BP: 131/69  Pulse: 82  Resp: 17  Weight: 183 lb 9.6 oz (83.3 kg)  Height: '5\' 11"'$  (1.803 m)   Body mass index is 25.61 kg/m. Gen: WD/WN, NAD Head: [place]/AT, No temporalis wasting.  Ear/Nose/Throat: Hearing grossly intact, nares w/o erythema or drainage Eyes: PER, EOMI, sclera nonicteric.  Neck: Supple, no masses.  No bruit or JVD.  Pulmonary:  Good air movement, no audible wheezing, no use of accessory muscles.  Cardiac: RRR, normal S1, S2, no Murmurs. Vascular:   Vessel Right Left  Radial Palpable Palpable  PT  Not Palpable Palpable  DP Not Palpable  Palpable  Gastrointestinal: soft, non-distended. No guarding/no peritoneal signs.  Musculoskeletal: M/S 5/5 throughout.  No visible deformity.  Neurologic: CN 2-12 intact. Pain and light touch intact in extremities.  Symmetrical.  Speech is fluent. Motor exam as listed above. Psychiatric: Judgment intact, Mood & affect appropriate for pt's clinical situation. Dermatologic: No rashes or ulcers noted.  No changes consistent with cellulitis.   CBC Lab Results  Component Value Date   WBC 18.9 (H) 08/28/2020   HGB 10.8 (L) 08/28/2020   HCT 33.5 (L) 08/28/2020   MCV 96.0 08/28/2020   PLT 313 08/28/2020    BMET    Component Value Date/Time   NA 134 (L) 08/28/2020 1801   K 3.7 08/28/2020 1801   CL 104 08/28/2020 1801   CO2 23 08/28/2020 1801   GLUCOSE 131 (H) 08/28/2020 1801   BUN 14 08/28/2020 1801   CREATININE 1.50 (H) 09/24/2021 0803   CALCIUM 8.2 (L) 08/28/2020 1801   GFRNONAA 51 (L) 08/28/2020 1801   GFRAA >60 02/22/2019 0236   Estimated Creatinine Clearance: 41.8 mL/min (A) (by C-G formula based on SCr of 1.5 mg/dL  (H)).  COAG Lab Results  Component Value Date   INR 1.1 06/20/2020    Radiology CT Angio Abd/Pel w/ and/or w/o  Result Date: 09/24/2021 CLINICAL DATA:  Infrarenal abdominal aortic aneurysm without rupture Abrupt change in size on of amber examination, concerning for endoleak Right common iliac artery aneurysms Intermittent claudication EXAM: CTA ABDOMEN AND PELVIS WITHOUT AND WITH CONTRAST TECHNIQUE: Multidetector CT imaging of the abdomen and pelvis was performed using the standard protocol during bolus administration of intravenous contrast. Multiplanar reconstructed images and MIPs were obtained and reviewed to evaluate the vascular anatomy. RADIATION DOSE REDUCTION: This exam was performed according to the departmental dose-optimization program which includes automated exposure control, adjustment of the mA and/or kV according to patient size and/or use of iterative reconstruction technique. CONTRAST:  123m OMNIPAQUE IOHEXOL 350 MG/ML SOLN COMPARISON:  CT abdomen pelvis 08/14/2020 CT abdomen pelvis 04/30/2020 FINDINGS: VASCULAR Aorta: Abdominal aortic aneurysm status post endograft repair again seen. There is no evidence endoleak. Maximum diameter of the aneurysm sac measures 6.0 cm which is not significantly changed compared to CT from 04/30/2020. Celiac: Mild smooth narrowing of the origin.  Otherwise patent. SMA: No significant abnormality. Renals: Moderate stenosis at the origin of the left main renal artery. No significant stenosis of the right main renal artery. IMA: Occluded at the origin. Opacification of distal branches indicative of retrograde collateral flow. Inflow: Right common iliac artery aneurysm measures 4.9 x 4.7 cm which is not significantly changed since 08/14/2020 where it measured approximately 4.9 x 4.7 cm. The stents extend into the right internal and external iliac arteries. The internal iliac artery stent is patent, however the external iliac artery stent is occluded. Left  common iliac artery aneurysm measures 3.2 x 3.1 cm which is unchanged from prior exam. Left internal iliac artery is occluded at the origin. Opacification of distal branches indicative of retrograde collateral flow. No significant narrowing of the external iliac artery. Proximal Outflow: Flow reconstitutes in the right common femoral artery. There is moderate stenosis of the mid right common femoral artery due to calcified plaque. There is moderate to severe stenosis of the origin of the right superficial femoral artery. There is complete to near complete occlusion at the origin of the right profundus femoris artery. Mild narrowing of the left common femoral artery due to calcified  plaque. Visualized segments of the left superficial femoral and profunda femoris arteries are patent without significant stenosis. Veins: No obvious venous abnormality within the limitations of this arterial phase study. Review of the MIP images confirms the above findings. NON-VASCULAR Lower chest: No acute abnormality. Hepatobiliary: No focal liver abnormality is seen. No gallstones, gallbladder wall thickening, or biliary dilatation. Pancreas: Unremarkable. No pancreatic ductal dilatation or surrounding inflammatory changes. Spleen: Normal in size without focal abnormality. Adrenals/Urinary Tract: Adrenal glands are normal. Mild diffuse thinning of renal cortices. No hydronephrosis or hydroureter. Two simple left renal cysts are seen measuring up to 2.2 cm. Evaluation the bladder is limited due to streak artifact from left total hip prosthesis. No discrete abnormality is identified. Stomach/Bowel: Small duodenal diverticulum. Diffuse colonic diverticulosis, greatest at the sigmoid. No evidence of acute diverticulitis. Lymphatic: No enlarged abdominal or pelvic lymph nodes. Reproductive: Moderately enlarged prostate. Other: No abdominal wall hernia or abnormality. No abdominopelvic ascites. Musculoskeletal: No acute or significant  osseous findings. IMPRESSION: VASCULAR 1. Infrarenal abdominal aortic aneurysm status post endograft repair. Maximum diameter of the aneurysm sac measures 6.0 cm which is not significantly changed when compared to examination from 04/30/2020. (The patient is currently under the care of a vascular specialist/surgeon). 2. Unchanged right common iliac artery aneurysm measuring up to 4.9 cm. 3. Unchanged left common iliac artery aneurysm measuring up to 3.2 cm. 4. Occluded right external and common iliac arteries with reconstitution of flow in the right common femoral artery. 5. High-grade stenosis at the origin of the right profundus femoris artery. NON-VASCULAR 1. Colonic diverticulosis. 2. Moderately enlarged prostate. Electronically Signed   By: Miachel Roux M.D.   On: 09/24/2021 12:35   VAS [place] ABI WITH/WO TBI  Result Date: 09/01/2021  LOWER EXTREMITY DOPPLER STUDY Patient Name:  CHOICE KLEINSASSER  Date of Exam:   09/01/2021 Medical Rec #: [id number]        Accession #:    [id number] Date of Birth: July 13, 1940        Patient Gender: M Patient Age:   57 years Exam Location:  [facility] Procedure:      VAS [place] ABI WITH/WO TBI Referring Phys: Eulogio Ditch --------------------------------------------------------------------------------  Indications: Claudication, and both hips rt > Lt.  Vascular Interventions: 07/03/20: EVAR with bifurcated right EIA segement & left                         EIA extender;                         Bilat CIA aneurysms with old occluded right CIA stent. Comparison Study: none Performing Technologist: Concha Norway RVT  Examination Guidelines: A complete evaluation includes at minimum, Doppler waveform signals and systolic blood pressure reading at the level of bilateral brachial, anterior tibial, and posterior tibial arteries, when vessel segments are accessible. Bilateral testing is considered an integral part of a complete examination. Photoelectric Plethysmograph (PPG) waveforms  and toe systolic pressure readings are included as required and additional duplex testing as needed. Limited examinations for reoccurring indications may be performed as noted.  ABI Findings: +---------+------------------+-----+-------------------+--------+ Right    Rt Pressure (mmHg)IndexWaveform           Comment  +---------+------------------+-----+-------------------+--------+ Brachial 119                                                +---------+------------------+-----+-------------------+--------+  ATA      90                0.75 monophasic                  +---------+------------------+-----+-------------------+--------+ PTA      73                0.61 dampened monophasic         +---------+------------------+-----+-------------------+--------+ Great Toe65                0.54 Abnormal                    +---------+------------------+-----+-------------------+--------+ +---------+------------------+-----+---------+-------+ Left     Lt Pressure (mmHg)IndexWaveform Comment +---------+------------------+-----+---------+-------+ Brachial 120                                     +---------+------------------+-----+---------+-------+ ATA      98                0.82 biphasic         +---------+------------------+-----+---------+-------+ PTA      134               1.12 triphasic        +---------+------------------+-----+---------+-------+ Esau Grew                0.69 Normal           +---------+------------------+-----+---------+-------+ Added duplex left CFA shows strong biphasic flow at 125cm/s.  Summary: Right: Resting right ankle-brachial index indicates moderate right lower extremity arterial disease. The right toe-brachial index is abnormal. Added duplex right CFA shows dampened monophasic flow at 37cm/s. Left: Resting left ankle-brachial index is within normal range. No evidence of significant left lower extremity arterial disease. The left  toe-brachial index is normal. *See table(s) above for measurements and observations.  Electronically signed by [name] MD on 09/01/2021 at 5:22:24 PM.    Final      Assessment/Plan 1. Infrarenal abdominal aortic aneurysm (AAA) without rupture (HCC) Recommend:  The patient has evidence of thrombosis of the right external iliac stent resulting in right lower extremity rest pain that is associated with preulcerative changes and impending tissue loss of the right foot.  This represents a limb threatening ischemia and places the patient at the risk for right limb loss.  Patient should undergo angiography of the right lower extremity with the hope for intervention for limb salvage.  The risks and benefits as well as the alternative therapies was discussed in detail with the patient.  All questions were answered.  Patient agrees to proceed with right lower extremity angiography.  The patient will follow up with me in the office after the procedure.   2. Atherosclerosis of native artery of right lower extremity with intermittent claudication ([place]) See #1  3. Atherosclerosis of native coronary artery of native heart without angina pectoris Continue cardiac and antihypertensive medications as already ordered and reviewed, no changes at this time.  Continue statin as ordered and reviewed, no changes at this time  Nitrates PRN for chest pain   4. Chronic obstructive pulmonary disease, unspecified COPD type ([place]) Continue pulmonary medications and aerosols as already ordered, these medications have been reviewed and there are no changes at this time.    5. Mixed hyperlipidemia Continue statin as ordered and reviewed, no changes at this time     [name], MD  09/28/2021 1:51 PM

## 2021-09-29 ENCOUNTER — Telehealth (INDEPENDENT_AMBULATORY_CARE_PROVIDER_SITE_OTHER): Payer: Self-pay

## 2021-09-29 NOTE — Telephone Encounter (Signed)
I attempted to contact the patient to give him the information regarding his right leg thrombectomy with Dr. Delana Meyer on 10/14/21 with a 9:00 am arrival time to the MM. Patient returned my call and was given all information and it will be mailed.

## 2021-10-01 ENCOUNTER — Other Ambulatory Visit: Payer: Self-pay

## 2021-10-01 DIAGNOSIS — N201 Calculus of ureter: Secondary | ICD-10-CM

## 2021-10-02 ENCOUNTER — Ambulatory Visit
Admission: RE | Admit: 2021-10-02 | Discharge: 2021-10-02 | Disposition: A | Discharge status: Home / Self Care | Payer: Medicare HMO | Attending: Urology | Admitting: Urology

## 2021-10-02 ENCOUNTER — Ambulatory Visit
Admission: RE | Admit: 2021-10-02 | Discharge: 2021-10-02 | Disposition: A | Discharge status: Home / Self Care | Payer: Medicare HMO | Source: Ambulatory Visit | Attending: Urology | Admitting: Urology

## 2021-10-02 ENCOUNTER — Ambulatory Visit: Payer: Medicare HMO | Admitting: Urology

## 2021-10-02 ENCOUNTER — Encounter: Payer: Self-pay | Admitting: Urology

## 2021-10-02 VITALS — BP 95/63 | HR 87 | Ht 71.0 in | Wt 185.0 lb

## 2021-10-02 DIAGNOSIS — Z87442 Personal history of urinary calculi: Secondary | ICD-10-CM | POA: Diagnosis not present

## 2021-10-02 DIAGNOSIS — N201 Calculus of ureter: Secondary | ICD-10-CM

## 2021-10-02 DIAGNOSIS — R339 Retention of urine, unspecified: Secondary | ICD-10-CM | POA: Diagnosis not present

## 2021-10-02 DIAGNOSIS — N2889 Other specified disorders of kidney and ureter: Secondary | ICD-10-CM | POA: Diagnosis not present

## 2021-10-02 LAB — BLADDER SCAN AMB NON-IMAGING: Scan Result: 101

## 2021-10-02 NOTE — Patient Instructions (Signed)
Transrectal Ultrasound  A transrectal ultrasound is a procedure that uses sound waves to create images of the prostate gland and nearby tissues. For this procedure, an ultrasound probe is placed in the rectum. The probe sends sound waves through the wall of the rectum into the prostate gland. The prostate is a walnut-sized gland that is located below the bladder and in front of the rectum. The images show the size and shape of the prostate gland and nearby structures. You may need this test if you have: Trouble urinating. Trouble getting your partner pregnant (infertility). An abnormal result from a prostate screening exam. Tell a health care provider about: Any allergies you have. All medicines you are taking, including vitamins, herbs, eye drops, creams, and over-the-counter medicines. Any bleeding problems you have. Any surgeries you have had. Any medical conditions you have. Any prostate infections you have had. What are the risks? Generally, this is a safe procedure. However, problems may occur, including: Discomfort during the procedure. Blood in your urine or sperm after the procedure. This may occur if a sample of tissue is taken to look at under a microscope (biopsy) during the procedure. What happens before the procedure? Your health care provider may instruct you to use an enema 1-4 hours before the procedure. Follow instructions from your health care provider about how to do the enema. Ask your health care provider about: Changing or stopping your regular medicines. This is especially important if you are taking diabetes medicines or blood thinners. Taking medicines such as aspirin and ibuprofen. These medicines can thin your blood. Do not take these medicines unless your health care provider tells you to take them. Taking over-the-counter medicines, vitamins, herbs, and supplements. What happens during the procedure? You will be asked to lie down on your left side on an exam  table. You will bend your knees toward your chest. Gel will be put on a small probe that is about the width of a finger. The probe will be gently inserted into your rectum. You may have a feeling of fullness but should not feel pain. The probe will send signals to a computer that will create images. These will be displayed on a monitor that looks like a small television screen. The technician will slightly rotate the probe throughout the procedure. While rotating the probe, he or she will view and capture images of the prostate gland and the surrounding structures from different angles. Your health care provider may take a biopsy sample of prostate tissue during the procedure. The images captured from the ultrasound will help guide the needle that is used to remove a sample of tissue. The sample will be sent to a lab for testing. The probe will be removed. The procedure may vary among health care providers and hospitals. What can I expect after the procedure? It is up to you to get the results of your procedure. Ask your health care provider, or the department that is doing the procedure, when your results will be ready. Keep all follow-up visits. This is important. Summary A transrectal ultrasound is a procedure that uses sound waves to create images of the prostate gland and nearby tissues. The images show the size and shape of the prostate gland and nearby structures. Before the procedure, ask your health care provider about changing or stopping your regular medicines. This is especially important if you are taking diabetes medicines or blood thinners. This information is not intended to replace advice given to you by your health care   provider. Make sure you discuss any questions you have with your health care provider. Document Revised: 11/20/2020 Document Reviewed: 09/02/2020 Elsevier Patient Education  2023 Elsevier Inc.  Cystoscopy Cystoscopy is a procedure that is used to help diagnose  and sometimes treat conditions that affect the lower urinary tract. The lower urinary tract includes the bladder and the urethra. The urethra is the tube that drains urine from the bladder. Cystoscopy is done using a thin, tube-shaped instrument with a light and camera at the end (cystoscope). The cystoscope may be hard or flexible, depending on the goal of the procedure. The cystoscope is inserted through the urethra, into the bladder. Cystoscopy may be recommended if you have: Urinary tract infections that keep coming back. Blood in the urine (hematuria). An inability to control when you urinate (urinary incontinence) or an overactive bladder. Unusual cells found in a urine sample. A blockage in the urethra, such as a urinary stone. Painful urination. An abnormality in the bladder found during an intravenous pyelogram (IVP) or CT scan. What are the risks? Generally, this is a safe procedure. However, problems may occur, including: Infection. Bleeding.  What happens during the procedure?  You will be given one or more of the following: A medicine to numb the area (local anesthetic). The area around the opening of your urethra will be cleaned. The cystoscope will be passed through your urethra into your bladder. Germ-free (sterile) fluid will flow through the cystoscope to fill your bladder. The fluid will stretch your bladder so that your health care provider can clearly examine your bladder walls. Your doctor will look at the urethra and bladder. The cystoscope will be removed The procedure may vary among health care providers  What can I expect after the procedure? After the procedure, it is common to have: Some soreness or pain in your urethra. Urinary symptoms. These include: Mild pain or burning when you urinate. Pain should stop within a few minutes after you urinate. This may last for up to a few days after the procedure. A small amount of blood in your urine for several  days. Feeling like you need to urinate but producing only a small amount of urine. Follow these instructions at home: General instructions Return to your normal activities as told by your health care provider.  Drink plenty of fluids after the procedure. Keep all follow-up visits as told by your health care provider. This is important. Contact a health care provider if you: Have pain that gets worse or does not get better with medicine, especially pain when you urinate lasting longer than 72 hours after the procedure. Have trouble urinating. Get help right away if you: Have blood clots in your urine. Have a fever or chills. Are unable to urinate. Summary Cystoscopy is a procedure that is used to help diagnose and sometimes treat conditions that affect the lower urinary tract. Cystoscopy is done using a thin, tube-shaped instrument with a light and camera at the end. After the procedure, it is common to have some soreness or pain in your urethra. It is normal to have blood in your urine after the procedure.  If you were prescribed an antibiotic medicine, take it as told by your health care provider.  This information is not intended to replace advice given to you by your health care provider. Make sure you discuss any questions you have with your health care provider. Document Revised: 03/01/2018 Document Reviewed: 03/01/2018 Elsevier Patient Education  2020 Elsevier Inc.   

## 2021-10-02 NOTE — Progress Notes (Signed)
10/02/21 1:09 PM   Garrett Moore [birth year] [id number]  Referring provider:  [name], MD [address] [place],  [place] [address] Chief Complaint  Patient presents with   Nephrolithiasis     HPI: Garrett Moore is a 81 y.o.male with a personal history of left UVJ stone/ BPH who returns today for f/u.  He was taken to the operating room on 08/12/2020 for complex ureteroscopy for a chronically obstructing distal UVJ stone in the setting of a complete left duplicated collecting system.  Stone analysis 100% calcium oxalate monohydrate stone. His postoperative course was complicated by urinary retention and UTI.  RUS on 09/24/2020 indicated resolution of bilateral hydronephrosis and is otherwise unremarkable.  He does have thickened bladder however its decompressed at the time of study.  He underwent a CT angio anbdomen and pelvis on 09/24/2021 that visualized mild diffuse thinning of renal cortices. No hydronephrosis or hydroureter. Two simple left renal cysts are seen measuring up to 2.2 cm. Evaluation the bladder is limited due to streak artifact from left total hip prosthesis. No discrete abnormality is identified.  KUB today was personally reviewed and interpreted it shows a possible very small calcification on left side.   He is not interested at this time in medication for his urinary symptoms; tried and failed Flomax even at 0.8 mg dose.   He has blood clots and is undergoing care for this, scheduled for what sounds like a thrombectomy in a few weeks..  IPSS 28 today and PVR 101 ml.    IPSS     Row Name 10/02/21 1000         International Prostate Symptom Score   How often have you had the sensation of not emptying your bladder? More than half the time     How often have you had to urinate less than every two hours? About half the time     How often have you found you stopped and started again several times when you urinated? Almost always     How often  have you found it difficult to postpone urination? More than half the time     How often have you had a weak urinary stream? Almost always     How often have you had to strain to start urination? About half the time     How many times did you typically get up at night to urinate? 4 Times     Total IPSS Score 28       Quality of Life due to urinary symptoms   If you were to spend the rest of your life with your urinary condition just the way it is now how would you feel about that? Unhappy              Score:  1-7 Mild 8-19 Moderate 20-35 Severe   PMH: Past Medical History:  Diagnosis Date   Aneurysm of infrarenal abdominal aorta ([facility]) 05/10/2020   s/p EVAR 07/03/2020   Aortic atherosclerosis (HCC)    Aortic valvar stenosis 02/08/2018   Moderate   Atrial fibrillation (HCC)    Basal cell carcinoma    Face   BPH (benign prostatic hyperplasia)    Carotid artery stenosis    CKD (chronic kidney disease), stage III (HCC)    COPD (chronic obstructive pulmonary disease) (HCC)    Coronary artery disease    DVT (deep venous thrombosis) ([facility])    age 51, leg after Tonsillectomy   Family history  of adverse reaction to anesthesia    mother under anesthesia for extended period for AAA memory problems/dementia after   Grade I diastolic dysfunction 59/9357   Heart murmur    History of kidney stones    Hyperlipidemia    Myocardial infarction ([facility]) 07/1983   IABP overnight then removed and nothing else.   OA (osteoarthritis)    Peripheral vascular disease (HCC)    Presence of arterial stent 07/03/2020   BILATERAL iliac arteries   Ureteral calculus, left 07/2020    Surgical History: Past Surgical History:  Procedure Laterality Date   ABDOMINAL AORTIC ANEURYSM REPAIR     CARDIAC CATHETERIZATION  08/14/1983   CARDIOVASCULAR STRESS TEST  01/23/2011   normal myocardial perfusion scan demonstrating an attenuation artifact in the inferior region of the myocardium. no ischemia or  infarct/scar seen in remaining myocardium   CAROTID DUPLEX  05/21/2006   right bulb-0-49% diameter reduction; left bulb and proximal ICA-0-49% diameter reduction   CATARACT EXTRACTION, BILATERAL  01/2019   CORONARY ANGIOPLASTY  07/1983   PTCA Prox RCA   CYSTOSCOPY W/ RETROGRADES  08/12/2020   Procedure: CYSTOSCOPY WITH RETROGRADE PYELOGRAM;  Surgeon: Hollice Espy, MD;  Location: [facility];  Service: Urology;;   CYSTOSCOPY/URETEROSCOPY/HOLMIUM LASER/STENT PLACEMENT Left 08/12/2020   Procedure: CYSTOSCOPY/URETEROSCOPY/HOLMIUM LASER/STENT PLACEMENT;  Surgeon: Hollice Espy, MD;  Location: [facility];  Service: Urology;  Laterality: Left;   DOPPLER ECHOCARDIOGRAPHY  07/21/2012   EF 50-55%, aortic valve noncoronary cusp mobility was severely restricted.   ENDOVASCULAR REPAIR/STENT GRAFT N/A 07/03/2020   Procedure: ENDOVASCULAR REPAIR/STENT GRAFT;  Surgeon: Katha Cabal, MD;  Location: [facility];  Service: Cardiovascular;  Laterality: N/A;   EYE SURGERY Bilateral    cataracts   ROTATOR CUFF REPAIR Left    TEMPORARY PACEMAKER  07/1983   TONSILLECTOMY     TOTAL HIP ARTHROPLASTY Left 02/21/2019   Procedure: TOTAL HIP ARTHROPLASTY ANTERIOR APPROACH;  Surgeon: Paralee Cancel, MD;  Location: [facility];  Service: Orthopedics;  Laterality: Left;  70 mins   VITRECTOMY Bilateral     Home Medications:  Allergies as of 10/02/2021       Reactions   Quinolones Other (See Comments)   Other reaction(s): Has aortic root dilatation        Medication List        Accurate as of October 02, 2021  1:09 PM. If you have any questions, ask your nurse or doctor.          albuterol 108 (90 Base) MCG/ACT inhaler Commonly known as: VENTOLIN HFA as needed.   aspirin EC 81 MG tablet Take 81 mg by mouth daily. Swallow whole.   clopidogrel 75 MG tablet Commonly known as: PLAVIX Take 1 tablet (75 mg total) by mouth daily at 6 (six) AM.   multivitamin with minerals Tabs tablet Take 1 tablet by  mouth daily.   nicotine polacrilex 2 MG lozenge Commonly known as: COMMIT Take 2 mg by mouth as needed for smoking cessation.   simvastatin 80 MG tablet Commonly known as: ZOCOR Take 80 mg by mouth daily at 6 PM.   tamsulosin 0.4 MG Caps capsule Commonly known as: Flomax Take 1 capsule (0.4 mg total) by mouth daily.        Allergies:  Allergies  Allergen Reactions   Quinolones Other (See Comments)    Other reaction(s): Has aortic root dilatation    Family History: Family History  Problem Relation Age of Onset   Heart Problems Mother  AAA (abdominal aortic aneurysm) Mother     Social History:  reports that he has quit smoking. His smoking use included cigarettes. He has a 100.00 pack-year smoking history. He has never used smokeless tobacco. He reports that he does not currently use alcohol. He reports that he does not use drugs.   Physical Exam: BP 95/63   Pulse 87   Ht '5\' 11"'$  (1.803 m)   Wt 185 lb (83.9 kg)   BMI 25.80 kg/m   Constitutional:  Alert and oriented, No acute distress. HEENT: [place] AT, moist mucus membranes.  Trachea midline, no masses. Cardiovascular: No clubbing, cyanosis, or edema. Respiratory: Normal respiratory effort, no increased work of breathing. Skin: No rashes, bruises or suspicious lesions. Neurologic: Grossly intact, no focal deficits, moving all 4 extremities. Psychiatric: Normal mood and affect.  Laboratory Data:  Lab Results  Component Value Date   CREATININE 1.50 (H) 09/24/2021     Pertinent Imaging: CLINICAL DATA:  Infrarenal abdominal aortic aneurysm without rupture   Abrupt change in size on of amber examination, concerning for endoleak   Right common iliac artery aneurysms   Intermittent claudication   EXAM: CTA ABDOMEN AND PELVIS WITHOUT AND WITH CONTRAST   TECHNIQUE: Multidetector CT imaging of the abdomen and pelvis was performed using the standard protocol during bolus administration of intravenous  contrast. Multiplanar reconstructed images and MIPs were obtained and reviewed to evaluate the vascular anatomy.   RADIATION DOSE REDUCTION: This exam was performed according to the departmental dose-optimization program which includes automated exposure control, adjustment of the mA and/or kV according to patient size and/or use of iterative reconstruction technique.   CONTRAST:  168m OMNIPAQUE IOHEXOL 350 MG/ML SOLN   COMPARISON:  CT abdomen pelvis 08/14/2020   CT abdomen pelvis 04/30/2020   FINDINGS: VASCULAR   Aorta: Abdominal aortic aneurysm status post endograft repair again seen. There is no evidence endoleak. Maximum diameter of the aneurysm sac measures 6.0 cm which is not significantly changed compared to CT from 04/30/2020.   Celiac: Mild smooth narrowing of the origin.  Otherwise patent.   SMA: No significant abnormality.   Renals: Moderate stenosis at the origin of the left main renal artery. No significant stenosis of the right main renal artery.   IMA: Occluded at the origin. Opacification of distal branches indicative of retrograde collateral flow.   Inflow: Right common iliac artery aneurysm measures 4.9 x 4.7 cm which is not significantly changed since 08/14/2020 where it measured approximately 4.9 x 4.7 cm. The stents extend into the right internal and external iliac arteries. The internal iliac artery stent is patent, however the external iliac artery stent is occluded.   Left common iliac artery aneurysm measures 3.2 x 3.1 cm which is unchanged from prior exam. Left internal iliac artery is occluded at the origin. Opacification of distal branches indicative of retrograde collateral flow. No significant narrowing of the external iliac artery.   Proximal Outflow: Flow reconstitutes in the right common femoral artery. There is moderate stenosis of the mid right common femoral artery due to calcified plaque. There is moderate to severe stenosis of the  origin of the right superficial femoral artery. There is complete to near complete occlusion at the origin of the right profundus femoris artery.   Mild narrowing of the left common femoral artery due to calcified plaque. Visualized segments of the left superficial femoral and profunda femoris arteries are patent without significant stenosis.   Veins: No obvious venous abnormality within the limitations of this  arterial phase study.   Review of the MIP images confirms the above findings.   NON-VASCULAR   Lower chest: No acute abnormality.   Hepatobiliary: No focal liver abnormality is seen. No gallstones, gallbladder wall thickening, or biliary dilatation.   Pancreas: Unremarkable. No pancreatic ductal dilatation or surrounding inflammatory changes.   Spleen: Normal in size without focal abnormality.   Adrenals/Urinary Tract: Adrenal glands are normal. Mild diffuse thinning of renal cortices. No hydronephrosis or hydroureter. Two simple left renal cysts are seen measuring up to 2.2 cm. Evaluation the bladder is limited due to streak artifact from left total hip prosthesis. No discrete abnormality is identified.   Stomach/Bowel: Small duodenal diverticulum. Diffuse colonic diverticulosis, greatest at the sigmoid. No evidence of acute diverticulitis.   Lymphatic: No enlarged abdominal or pelvic lymph nodes.   Reproductive: Moderately enlarged prostate.   Other: No abdominal wall hernia or abnormality. No abdominopelvic ascites.   Musculoskeletal: No acute or significant osseous findings.   IMPRESSION: VASCULAR   1. Infrarenal abdominal aortic aneurysm status post endograft repair. Maximum diameter of the aneurysm sac measures 6.0 cm which is not significantly changed when compared to examination from 04/30/2020. (The patient is currently under the care of a vascular specialist/surgeon). 2. Unchanged right common iliac artery aneurysm measuring up to 4.9 cm. 3.  Unchanged left common iliac artery aneurysm measuring up to 3.2 cm. 4. Occluded right external and common iliac arteries with reconstitution of flow in the right common femoral artery. 5. High-grade stenosis at the origin of the right profundus femoris artery.   NON-VASCULAR   1. Colonic diverticulosis. 2. Moderately enlarged prostate.     Electronically Signed   By: Miachel Roux M.D.   On: 09/24/2021 12:35   I have personally reviewed the images and agree with radiologist interpretation.   KUB today was personally reviewed and interpreted see HPI. Radiology interpretation pending   Results for orders placed or performed in visit on 10/02/21  Bladder Scan (Post Void Residual) in office  Result Value Ref Range   Scan Result 101      Assessment & Plan:    1. History of kidney stones - S/p ureteroscopy  -KUB was reassuring, no significant recurrent stone disease - We discussed general stone prevention techniques including drinking plenty water with goal of producing 2.5 L urine daily, increased citric acid intake, avoidance of high oxalate containing foods, and decreased salt intake.  Information about dietary recommendations given today.   2. History of urinary retention  - PVR of 101 ml today.  -Poorly controlled urinary symptoms, would like to stop Flomax as it has not been helpful - Discussed further evaluation of urinary symptoms with cystoscopy to see if he is a good candidate for outlet procedure such as Urolift, TURP, or HoLEP.  - Discussed that if he is a good candidate may need to hold off on procedure depending on if he is on blood thinners. He is agreeable with this plan.  - Cystoscopy; scheduled.   Return for Cysto/TRUS.  Conley Rolls as a Education administrator for Hollice Espy, MD.,have documented all relevant documentation on the behalf of Hollice Espy, MD,as directed by  Hollice Espy, MD while in the presence of Hollice Espy, MD.  I have reviewed the  above documentation for accuracy and completeness, and I agree with the above.   Hollice Espy, MD    [facility] [address] [place], [place] [address] [phone]

## 2021-10-09 NOTE — Progress Notes (Signed)
Patient was called and told to be at the [facility] entrance on 7/25 at 11am for his 12n procedure.  Patient voices understanding of the time change.  Procedure time was moved from 10am to 12n for anesthesia and doctor availability.

## 2021-10-14 ENCOUNTER — Ambulatory Visit
Admission: RE | Admit: 2021-10-14 | Discharge: 2021-10-14 | Disposition: A | Discharge status: Home / Self Care | Payer: Medicare HMO | Source: Ambulatory Visit | Attending: Vascular Surgery | Admitting: Vascular Surgery

## 2021-10-14 ENCOUNTER — Ambulatory Visit: Payer: Medicare HMO | Admitting: Anesthesiology

## 2021-10-14 ENCOUNTER — Encounter
Admission: RE | Disposition: A | Discharge status: Home / Self Care | Payer: Self-pay | Source: Ambulatory Visit | Attending: Vascular Surgery

## 2021-10-14 ENCOUNTER — Encounter: Payer: Self-pay | Admitting: Vascular Surgery

## 2021-10-14 ENCOUNTER — Other Ambulatory Visit: Payer: Self-pay

## 2021-10-14 DIAGNOSIS — I7143 Infrarenal abdominal aortic aneurysm, without rupture: Secondary | ICD-10-CM | POA: Insufficient documentation

## 2021-10-14 DIAGNOSIS — I714 Abdominal aortic aneurysm, without rupture, unspecified: Secondary | ICD-10-CM

## 2021-10-14 DIAGNOSIS — J449 Chronic obstructive pulmonary disease, unspecified: Secondary | ICD-10-CM | POA: Insufficient documentation

## 2021-10-14 DIAGNOSIS — Z87891 Personal history of nicotine dependence: Secondary | ICD-10-CM | POA: Diagnosis not present

## 2021-10-14 DIAGNOSIS — Z95828 Presence of other vascular implants and grafts: Secondary | ICD-10-CM | POA: Diagnosis not present

## 2021-10-14 DIAGNOSIS — T82868A Thrombosis of vascular prosthetic devices, implants and grafts, initial encounter: Secondary | ICD-10-CM | POA: Diagnosis not present

## 2021-10-14 DIAGNOSIS — Z9889 Other specified postprocedural states: Secondary | ICD-10-CM

## 2021-10-14 DIAGNOSIS — E782 Mixed hyperlipidemia: Secondary | ICD-10-CM | POA: Insufficient documentation

## 2021-10-14 DIAGNOSIS — I70211 Atherosclerosis of native arteries of extremities with intermittent claudication, right leg: Secondary | ICD-10-CM | POA: Diagnosis not present

## 2021-10-14 DIAGNOSIS — I708 Atherosclerosis of other arteries: Secondary | ICD-10-CM

## 2021-10-14 DIAGNOSIS — I82409 Acute embolism and thrombosis of unspecified deep veins of unspecified lower extremity: Secondary | ICD-10-CM

## 2021-10-14 DIAGNOSIS — I723 Aneurysm of iliac artery: Secondary | ICD-10-CM

## 2021-10-14 DIAGNOSIS — I70202 Unspecified atherosclerosis of native arteries of extremities, left leg: Secondary | ICD-10-CM | POA: Diagnosis not present

## 2021-10-14 DIAGNOSIS — I745 Embolism and thrombosis of iliac artery: Secondary | ICD-10-CM | POA: Insufficient documentation

## 2021-10-14 DIAGNOSIS — I252 Old myocardial infarction: Secondary | ICD-10-CM | POA: Diagnosis not present

## 2021-10-14 DIAGNOSIS — I2510 Atherosclerotic heart disease of native coronary artery without angina pectoris: Secondary | ICD-10-CM | POA: Diagnosis not present

## 2021-10-14 HISTORY — PX: PERIPHERAL VASCULAR THROMBECTOMY: CATH118306

## 2021-10-14 HISTORY — PX: PELVIC ANGIOGRAPHY: CATH118254

## 2021-10-14 LAB — BUN: BUN: 19 mg/dL (ref 8–23)

## 2021-10-14 LAB — CREATININE, SERUM
Creatinine, Ser: 1.39 mg/dL — ABNORMAL HIGH (ref 0.61–1.24)
GFR, Estimated: 51 mL/min — ABNORMAL LOW (ref >60)

## 2021-10-14 SURGERY — PERIPHERAL VASCULAR THROMBECTOMY
Anesthesia: General | Laterality: Right

## 2021-10-14 MED ORDER — PROPOFOL 10 MG/ML IV BOLUS
INTRAVENOUS | Status: DC | PRN
  Administered 2021-10-14: 160 mg via INTRAVENOUS

## 2021-10-14 MED ORDER — OXYCODONE HCL 5 MG/5ML PO SOLN: 5.0000 mg | Freq: Once | ORAL | Status: DC | PRN

## 2021-10-14 MED ORDER — HYDROMORPHONE HCL 1 MG/ML IJ SOLN: 1.0000 mg | Freq: Once | INTRAMUSCULAR | Status: DC | PRN

## 2021-10-14 MED ORDER — FENTANYL CITRATE (PF) 100 MCG/2ML IJ SOLN
INTRAMUSCULAR | Status: AC
  Filled 2021-10-14: qty 2

## 2021-10-14 MED ORDER — SODIUM CHLORIDE 0.9% FLUSH: 3.0000 mL | INTRAVENOUS | Status: DC | PRN

## 2021-10-14 MED ORDER — PROPOFOL 10 MG/ML IV BOLUS
INTRAVENOUS | Status: AC
  Filled 2021-10-14: qty 40

## 2021-10-14 MED ORDER — IODIXANOL 320 MG/ML IV SOLN
INTRAVENOUS | Status: DC | PRN
  Administered 2021-10-14: 65 mL via INTRA_ARTERIAL

## 2021-10-14 MED ORDER — MIDAZOLAM HCL 2 MG/ML PO SYRP: 8.0000 mg | ORAL_SOLUTION | Freq: Once | ORAL | Status: DC | PRN

## 2021-10-14 MED ORDER — SODIUM CHLORIDE 0.9 % IV SOLN: 250.0000 mL | INTRAVENOUS | Status: DC | PRN

## 2021-10-14 MED ORDER — PHENYLEPHRINE HCL-NACL 20-0.9 MG/250ML-% IV SOLN
INTRAVENOUS | Status: DC | PRN
  Administered 2021-10-14: 30 ug/min via INTRAVENOUS

## 2021-10-14 MED ORDER — LIDOCAINE HCL (PF) 2 % IJ SOLN
INTRAMUSCULAR | Status: AC
  Filled 2021-10-14: qty 5

## 2021-10-14 MED ORDER — ONDANSETRON HCL 4 MG/2ML IJ SOLN: 4.0000 mg | Freq: Four times a day (QID) | INTRAMUSCULAR | Status: DC | PRN

## 2021-10-14 MED ORDER — METHYLPREDNISOLONE SODIUM SUCC 125 MG IJ SOLR: 125.0000 mg | Freq: Once | INTRAMUSCULAR | Status: DC | PRN

## 2021-10-14 MED ORDER — EPHEDRINE SULFATE (PRESSORS) 50 MG/ML IJ SOLN
INTRAMUSCULAR | Status: DC | PRN
  Administered 2021-10-14: 5 mg via INTRAVENOUS
  Administered 2021-10-14: 10 mg via INTRAVENOUS

## 2021-10-14 MED ORDER — HEPARIN SODIUM (PORCINE) 1000 UNIT/ML IJ SOLN
INTRAMUSCULAR | Status: DC | PRN
  Administered 2021-10-14: 6000 [IU] via INTRAVENOUS

## 2021-10-14 MED ORDER — ONDANSETRON HCL 4 MG/2ML IJ SOLN
INTRAMUSCULAR | Status: DC | PRN
  Administered 2021-10-14: 4 mg via INTRAVENOUS

## 2021-10-14 MED ORDER — FAMOTIDINE 20 MG PO TABS: 40.0000 mg | ORAL_TABLET | Freq: Once | ORAL | Status: DC | PRN

## 2021-10-14 MED ORDER — SODIUM CHLORIDE 0.9 % IV SOLN: INTRAVENOUS | Status: DC

## 2021-10-14 MED ORDER — HEPARIN SODIUM (PORCINE) 1000 UNIT/ML IJ SOLN
INTRAMUSCULAR | Status: AC
  Filled 2021-10-14: qty 10

## 2021-10-14 MED ORDER — OXYCODONE HCL 5 MG PO TABS: 5.0000 mg | ORAL_TABLET | Freq: Once | ORAL | Status: DC | PRN

## 2021-10-14 MED ORDER — HYDRALAZINE HCL 20 MG/ML IJ SOLN: 5.0000 mg | INTRAMUSCULAR | Status: DC | PRN

## 2021-10-14 MED ORDER — CEFAZOLIN SODIUM-DEXTROSE 2-4 GM/100ML-% IV SOLN
2.0000 g | INTRAVENOUS | Status: AC
  Administered 2021-10-14: 2 g via INTRAVENOUS

## 2021-10-14 MED ORDER — LIDOCAINE HCL (PF) 2 % IJ SOLN
INTRAMUSCULAR | Status: DC | PRN
  Administered 2021-10-14: 80 mg

## 2021-10-14 MED ORDER — PHENYLEPHRINE HCL (PRESSORS) 10 MG/ML IV SOLN
INTRAVENOUS | Status: DC | PRN
  Administered 2021-10-14 (×2): 40 ug via INTRAVENOUS
  Administered 2021-10-14: 10 ug via INTRAVENOUS

## 2021-10-14 MED ORDER — SEVOFLURANE IN SOLN
RESPIRATORY_TRACT | Status: AC
  Filled 2021-10-14: qty 250

## 2021-10-14 MED ORDER — MORPHINE SULFATE (PF) 4 MG/ML IV SOLN: 2.0000 mg | INTRAVENOUS | Status: DC | PRN

## 2021-10-14 MED ORDER — PHENYLEPHRINE HCL-NACL 20-0.9 MG/250ML-% IV SOLN
INTRAVENOUS | Status: AC
  Filled 2021-10-14: qty 250

## 2021-10-14 MED ORDER — DEXAMETHASONE SODIUM PHOSPHATE 10 MG/ML IJ SOLN
INTRAMUSCULAR | Status: DC | PRN
  Administered 2021-10-14: 5 mg via INTRAVENOUS

## 2021-10-14 MED ORDER — OXYCODONE HCL 5 MG PO TABS: 5.0000 mg | ORAL_TABLET | ORAL | Status: DC | PRN

## 2021-10-14 MED ORDER — LABETALOL HCL 5 MG/ML IV SOLN: 10.0000 mg | INTRAVENOUS | Status: DC | PRN

## 2021-10-14 MED ORDER — FENTANYL CITRATE (PF) 100 MCG/2ML IJ SOLN
INTRAMUSCULAR | Status: DC | PRN
  Administered 2021-10-14: 50 ug via INTRAVENOUS
  Administered 2021-10-14 (×4): 12.5 ug via INTRAVENOUS

## 2021-10-14 MED ORDER — FENTANYL CITRATE (PF) 100 MCG/2ML IJ SOLN: 25.0000 ug | INTRAMUSCULAR | Status: DC | PRN

## 2021-10-14 MED ORDER — DEXAMETHASONE SODIUM PHOSPHATE 10 MG/ML IJ SOLN
INTRAMUSCULAR | Status: AC
  Filled 2021-10-14: qty 1

## 2021-10-14 MED ORDER — SODIUM CHLORIDE 0.9% FLUSH: 3.0000 mL | Freq: Two times a day (BID) | INTRAVENOUS | Status: DC

## 2021-10-14 MED ORDER — DIPHENHYDRAMINE HCL 50 MG/ML IJ SOLN: 50.0000 mg | Freq: Once | INTRAMUSCULAR | Status: DC | PRN

## 2021-10-14 MED ORDER — ACETAMINOPHEN 325 MG PO TABS
650.0000 mg | ORAL_TABLET | ORAL | Status: DC | PRN
Start: 2021-10-14 — End: 2021-10-14

## 2021-10-14 MED ORDER — GLYCOPYRROLATE 0.2 MG/ML IJ SOLN
INTRAMUSCULAR | Status: DC | PRN
  Administered 2021-10-14: .2 mg via INTRAVENOUS

## 2021-10-14 MED ORDER — ONDANSETRON HCL 4 MG/2ML IJ SOLN
INTRAMUSCULAR | Status: AC
  Filled 2021-10-14: qty 2

## 2021-10-14 SURGICAL SUPPLY — 57 items
BAG DECANTER FOR FLEXI CONT (MISCELLANEOUS) ×1 IMPLANT
BOOT SUTURE AID YELLOW STND (SUTURE) ×1 IMPLANT
CATH ANGIO 5F PIGTAIL 100CM (CATHETERS) ×1 IMPLANT
CATH BEACON 5 .035 100 C2 TIP (CATHETERS) ×1 IMPLANT
CATH BEACON 5 .035 40 KMP TP (CATHETERS) IMPLANT
CATH BEACON 5 .038 100 VERT TP (CATHETERS) ×1 IMPLANT
CATH BEACON 5 .038 40 KMP TP (CATHETERS) ×2
CATH INFINITI 5F MPA2 125 (CATHETERS) ×1 IMPLANT
CATH VERT 5FR 125CM (CATHETERS) ×1 IMPLANT
CLOSURE PERCLOSE PROSTYLE (VASCULAR PRODUCTS) ×3 IMPLANT
COVER DRAPE FLUORO 36X44 (DRAPES) ×2 IMPLANT
COVER PROBE U/S 5X48 (MISCELLANEOUS) ×1 IMPLANT
COVER SURGICAL LIGHT HANDLE (MISCELLANEOUS) ×1 IMPLANT
DERMABOND ADVANCED (GAUZE/BANDAGES/DRESSINGS) ×1
DERMABOND ADVANCED .7 DNX12 (GAUZE/BANDAGES/DRESSINGS) IMPLANT
DEVICE CLOSURE MYNXGRIP 6/7F (Vascular Products) ×1 IMPLANT
DEVICE TORQUE (MISCELLANEOUS) ×1 IMPLANT
DEVICE TORQUE .025-.038 (MISCELLANEOUS) ×1 IMPLANT
DRESSING SURGICEL FIBRLLR 1X2 (HEMOSTASIS) IMPLANT
DRSG SURGICEL FIBRILLAR 1X2 (HEMOSTASIS) ×2
ELECT CAUTERY BLADE 6.4 (BLADE) ×1 IMPLANT
GLIDEWIRE ADV .035X180CM (WIRE) ×1 IMPLANT
GLIDEWIRE ADV .035X260CM (WIRE) ×1 IMPLANT
GLOVE BIO SURGEON STRL SZ7 (GLOVE) ×2 IMPLANT
GLOVE SURG SYN 8.0 (GLOVE) ×4 IMPLANT
GLOVE SURG SYN 8.0 PF PI (GLOVE) IMPLANT
GOWN STRL REUS W/ TWL LRG LVL3 (GOWN DISPOSABLE) IMPLANT
GOWN STRL REUS W/ TWL XL LVL3 (GOWN DISPOSABLE) IMPLANT
GOWN STRL REUS W/TWL LRG LVL3 (GOWN DISPOSABLE) ×2
GOWN STRL REUS W/TWL XL LVL3 (GOWN DISPOSABLE) ×4
KIT ENCORE 26 ADVANTAGE (KITS) ×2 IMPLANT
LOOP RED MAXI  1X406MM (MISCELLANEOUS) ×1
LOOP VESSEL MAXI  1X406 RED (MISCELLANEOUS) ×1
LOOP VESSEL MAXI 1X406 RED (MISCELLANEOUS) IMPLANT
LOOP VESSEL MINI 0.8X406 BLUE (MISCELLANEOUS) IMPLANT
LOOPS BLUE MINI 0.8X406MM (MISCELLANEOUS) ×1
NDL ENTRY 21GA 7CM ECHOTIP (NEEDLE) IMPLANT
NEEDLE ENTRY 21GA 7CM ECHOTIP (NEEDLE) ×2 IMPLANT
PACK ANGIOGRAPHY (CUSTOM PROCEDURE TRAY) ×2 IMPLANT
PACK BASIN MAJOR ARMC (MISCELLANEOUS) ×1 IMPLANT
PENCIL ELECTRO HAND CTR (MISCELLANEOUS) ×1 IMPLANT
SET INTRO CAPELLA COAXIAL (SET/KITS/TRAYS/PACK) ×1 IMPLANT
SHEATH BRITE TIP 6FRX11 (SHEATH) ×1 IMPLANT
SHEATH BRITE TIP 7FRX5.5 (SHEATH) ×1 IMPLANT
SHEATH BRITE TIP 8FRX11 (SHEATH) ×1 IMPLANT
SPONGE XRAY 4X4 16PLY STRL (MISCELLANEOUS) ×6 IMPLANT
SUT MNCRL+ 5-0 UNDYED PC-3 (SUTURE) IMPLANT
SUT MONOCRYL 5-0 (SUTURE) ×2
SUT PROLENE 6 0 BV (SUTURE) ×3 IMPLANT
SUT SILK 2 0 (SUTURE) ×2
SUT SILK 2-0 18XBRD TIE 12 (SUTURE) IMPLANT
SUT VIC AB 2-0 CT1 (SUTURE) ×2 IMPLANT
SUT VICRYL+ 3-0 36IN CT-1 (SUTURE) ×2 IMPLANT
SYR MEDRAD MARK 7 150ML (SYRINGE) ×1 IMPLANT
TUBING CONNECTING 10 (TUBING) ×2 IMPLANT
TUBING CONTRAST HIGH PRESS 72 (TUBING) ×2 IMPLANT
WIRE GUIDERIGHT .035X150 (WIRE) ×1 IMPLANT

## 2021-10-14 NOTE — Anesthesia Preprocedure Evaluation (Signed)
Anesthesia Evaluation  Patient identified by MRN, date of birth, ID band Patient awake    Reviewed: Allergy & Precautions, H&P , NPO status , Patient's Chart, lab work & pertinent test results  History of Anesthesia Complications Negative for: history of anesthetic complications  Airway Mallampati: II  TM Distance: >3 FB     Dental  (+) Upper Dentures, Lower Dentures   Pulmonary neg sleep apnea, COPD, former smoker,    breath sounds clear to auscultation       Cardiovascular (-) angina+ CAD, + Past MI and + Peripheral Vascular Disease  (-) Cardiac Stents (-) dysrhythmias + Valvular Problems/Murmurs AS  Rhythm:regular Rate:Normal  AAA unruptured HFpEF, Grade 1 diastolic dysfunction Moderate AS   Neuro/Psych negative neurological ROS  negative psych ROS   GI/Hepatic negative GI ROS, Neg liver ROS,   Endo/Other  negative endocrine ROS  Renal/GU Renal disease (CKD)     Musculoskeletal   Abdominal   Peds  Hematology negative hematology ROS (+)   Anesthesia Other Findings Past Medical History: 05/10/2020: Aneurysm of infrarenal abdominal aorta (HCC)     Comment:  5.9 cm No date: Aortic atherosclerosis ([place]) 02/08/2018: Aortic valvar stenosis     Comment:  Mild to Moderate Noted on ECHO No date: Basal cell carcinoma     Comment:  Face No date: Carotid artery stenosis No date: CKD (chronic kidney disease), stage III (HCC) No date: COPD (chronic obstructive pulmonary disease) (HCC) No date: Coronary artery disease No date: DVT (deep venous thrombosis) (HCC)     Comment:  age 48, leg after Tonsillectomy No date: Family history of adverse reaction to anesthesia     Comment:  mother under anesthesia for extended period for AAA               memory problems/dementia after 63/8756: Grade I diastolic dysfunction No date: Heart murmur No date: Hyperlipidemia 07/1983: Myocardial infarction ([place]) No date: OA  (osteoarthritis)  Past Surgical History: 08/14/1983: CARDIAC CATHETERIZATION 01/23/2011: CARDIOVASCULAR STRESS TEST     Comment:  normal myocardial perfusion scan demonstrating an               attenuation artifact in the inferior region of the               myocardium. no ischemia or infarct/scar seen in remaining              myocardium 05/21/2006: CAROTID DUPLEX     Comment:  right bulb-0-49% diameter reduction; left bulb and               proximal ICA-0-49% diameter reduction 01/2019: CATARACT EXTRACTION, BILATERAL 07/1983: CORONARY ANGIOPLASTY     Comment:  PTCA Prox RCA 07/21/2012: DOPPLER ECHOCARDIOGRAPHY     Comment:  EF 50-55%, aortic valve noncoronary cusp mobility was               severely restricted. No date: ROTATOR CUFF REPAIR; Left 07/1983: TEMPORARY PACEMAKER No date: TONSILLECTOMY 02/21/2019: TOTAL HIP ARTHROPLASTY; Left     Comment:  Procedure: TOTAL HIP ARTHROPLASTY ANTERIOR APPROACH;                Surgeon: Paralee Cancel, MD;  Location: [facility];  Service:               Orthopedics;  Laterality: Left;  70 mins     Reproductive/Obstetrics negative OB ROS  Anesthesia Physical  Anesthesia Plan  ASA: 3  Anesthesia Plan: General ETT   Post-op Pain Management:    Induction: Intravenous  PONV Risk Score and Plan: Ondansetron, Dexamethasone and Treatment may vary due to age or medical condition  Airway Management Planned: LMA  Additional Equipment:   Intra-op Plan:   Post-operative Plan: Extubation in OR  Informed Consent: I have reviewed the patients History and Physical, chart, labs and discussed the procedure including the risks, benefits and alternatives for the proposed anesthesia with the patient or authorized representative who has indicated his/her understanding and acceptance.     Dental Advisory Given  Plan Discussed with: Anesthesiologist, CRNA and Surgeon  Anesthesia Plan Comments:  (Patient consented for risks of anesthesia including but not limited to:  - adverse reactions to medications - damage to eyes, teeth, lips or other oral mucosa - nerve damage due to positioning  - sore throat or hoarseness - Damage to heart, brain, nerves, lungs, other parts of body or loss of life  Patient voiced understanding.)        Anesthesia Quick Evaluation

## 2021-10-14 NOTE — Transfer of Care (Signed)
Immediate Anesthesia Transfer of Care Note  Patient: Garrett Moore  Procedure(s) Performed: PERIPHERAL VASCULAR THROMBECTOMY (Right) PELVIC ANGIOGRAPHY (Right)  Patient Location: PACU  Anesthesia Type:General  Level of Consciousness: drowsy  Airway & Oxygen Therapy: Patient Spontanous Breathing and Patient connected to face mask oxygen  Post-op Assessment: Report given to RN and Post -op Vital signs reviewed and stable  Post vital signs: Reviewed and stable  Last Vitals:  Vitals Value Taken Time  BP 125/60   Temp    Pulse 72   Resp 17   SpO2 99     Last Pain:  Vitals:   10/14/21 1122  TempSrc: Oral         Complications: No notable events documented.

## 2021-10-14 NOTE — Anesthesia Procedure Notes (Signed)
Procedure Name: LMA Insertion Date/Time: 10/14/2021 12:22 PM  Performed by: Daryel Gerald, RNPre-anesthesia Checklist: Patient identified, Patient being monitored, Timeout performed, Emergency Drugs available and Suction available Patient Re-evaluated:Patient Re-evaluated prior to induction Oxygen Delivery Method: Circle system utilized Preoxygenation: Pre-oxygenation with 100% oxygen Induction Type: IV induction Ventilation: Mask ventilation without difficulty LMA: LMA inserted LMA Size: 4.0 Tube type: Oral Number of attempts: 1 Placement Confirmation: positive ETCO2 and breath sounds checked- equal and bilateral Tube secured with: Tape Dental Injury: Teeth and Oropharynx as per pre-operative assessment

## 2021-10-14 NOTE — Op Note (Signed)
[facility]  Percutaneous Study/Intervention Procedural Note   Date of Surgery: 10/14/2021,2:26 PM  Surgeon: Katha Cabal, MD Assistant: Algernon Huxley, MD   Pre-operative Diagnosis: Atherosclerotic occlusive disease bilateral lower extremities with lifestyle limiting claudication of the right lower extremity; thrombosis of the external iliac stent status post EVAR  Post-operative diagnosis:  Same  Procedure(s) Performed:  1.  Abdominal aortogram left brachial approach  2.  Selective injection of the right and left lower extremity third order catheter placement left brachial approach  3.  Right external iliac artery angiogram right common femoral artery approach  4.  Open left brachial artery cutdown              5.  Ultrasound-guided access to the right common femoral artery             6.  Mynx right common femoral artery    Anesthesia: General anesthesia  Sheath: 6 French 11 cm Pinnacle sheath retrograde right common femoral; 7 [place] 5-1/2 cm Pinnacle sheath retrograde left brachial artery  Contrast: 65 cc   Fluoroscopy Time: 18.2 minutes  Indications:  The patient presents to [facility] with atherosclerotic occlusive disease bilateral lower extremities with worsening symptoms of the right lower extremity.  Pedal pulses are nonpalpable on the right suggesting hemodynamically significant atherosclerotic occlusive disease.  He is status post endovascular repair of his abdominal aortic aneurysm and associated with a iliac branch device for treatment of his right common iliac artery aneurysm.  Given the findings on physical exam and noninvasive studies CT angiogram was ordered which demonstrates there is occlusion of the right external iliac artery stent.  Given his claudication distance and the findings on CT angiography with hope for intervention has been recommended.  Risks and benefits as well as alternative therapies for lower extremity  revascularization are reviewed with the patient all questions are answered the patient agrees to proceed.  The patient is therefore undergoing angiography with the hope for intervention for limb salvage.   Procedure:  Garrett Moore a 81 y.o. male who was identified and appropriate procedural time out was performed.  The patient was then placed supine on the table and prepped and draped in the usual sterile fashion with the left arm extended palm upward and both groins prepped in the usual fashion.    The patient was placed under general anesthesia.  Initially the brachial artery on the left was addressed.  A linear incision was created the soft tissues dissected down to expose the brachial artery which was looped proximally distally with red Silastic Vesseloops.  Seldinger needle was then inserted under direct visualization followed by a J-wire and then a 6.5 cm 7 French Pinnacle sheath was placed in the retrograde direction.  Ultrasound was used to evaluate the right common femoral artery.  It was echolucent and pulsatile indicating it is patent .  An ultrasound image was acquired for the permanent record.  A micropuncture needle was used to access the right common femoral artery under direct ultrasound guidance.  The microwire was then advanced under fluoroscopic guidance without difficulty followed by the micro-sheath.  A 0.035 J wire was advanced without resistance and a 6Fr sheath was placed.    A Kumpe catheter and a 0.035 advantage wire were then advanced into the proximal external iliac artery just below the level of the previously placed stent.  Hand-injection of contrast through the Kumpe catheter was then performed.  The detector was then repositioned and an  RAO projection of the femoral artery was obtained by hand-injection.  Distal runoff of the right lower extremity was then obtained by hand-injection through the sheath.  Attention was then returned to the Kumpe catheter and advantage  wire after multiple, numerous attempts I was then unable to cross the occluded stent.  On several occasions I noted the wire had traveled underneath the stent but was never able to cross in the true lumen.  We then turned our attention to the brachial approach and using a combination of a Kumpe catheter and the advantage wire the advantage wire was negotiated into the arch of the aorta and then down into the descending thoracic aorta.  Kumpe catheter was then positioned above the aortic stent and AP projection of the aorta was obtained.  Pigtail catheter was then exchanged for a vertebral catheter and the right limb selected hand-injection contrast was then performed in several oblique views to demonstrate the iliac bifurcation and the catheter was advanced into the internal iliac artery where distal runoff of the internal iliac was obtained.  Using a combination of the advantage wire and a Kumpe catheter, multipurpose catheter and C2 catheter attempts at crossing the occluded external iliac stent in the antegrade direction were performed but after numerous attempts we were unable to cross.  We then pulled the catheter back and selected the left limb advance the catheter down to the distal external iliac artery on the left and oblique views of the left common femoral and femoral bifurcation were obtained.  After review these images the catheter and wire were removed the sheath was removed and the brachial artery was repaired it with interrupted 6-0 Prolene sutures.  The incision was then irrigated with sterile saline and closed in layers using 3-0 Vicryl followed by 4 Monocryl and Dermabond.  After review of the images the catheter was removed over wire and an RAO view of the right groin was obtained.  Mynx device was deployed without difficulty.   Findings:   Aortogram: The abdominal aorta is opacified with a bolus injection contrast.  Single renal arteries are noted bilaterally with normal nephrograms.  No  evidence of hemodynamically significant renal artery stenosis.  The aortic portion of the stent graft is located just below the level of the renals.  Is widely patent and there are no endoleak's identified.  The flow divider within the main body of the EVAR device is widely patent.  The left limb and left external iliac arteries are widely patent.  On the right the proximal portion of the iliac branch device is patent as is the stent leading into the internal iliac.  The internal iliac distally has extensive collaterals and is widely patent.  There is a flush occlusion of the external iliac stent.  Distally the external iliac artery is patent but moderate to severely diseased..  Right lower Extremity: The right common femoral severe diffuse disease greater than 70% throughout its length which was underappreciated on the CT scan.  The profunda femoris has a greater than 80% stenosis at its ostium and the superficial femoral artery has a greater than 80% stenosis which is focal perhaps 30 mm in length at Hunter's canal. The popliteal artery demonstrate moderate atherosclerotic changes but there are no hemodynamically significant lesions.  Trifurcation is patent.  There is 3 vessel runoff to the foot.             Left lower Extremity: The left common femoral demonstrates diffuse 50 to 60% stenosis with an  ostial profunda lesion that is greater than 80%.  There is moderate disease in the visualized portions of the SFA.  SUMMARY: Based on these images no intervention is performed at this time as we are unable to cross the occluded iliac lesion.  Given the burden of disease in the common femorals particularly on the right as well as the bilateral critical ostial lesions of the profunda femoris consideration for a femoral-femoral bypass should be made and discussed with the patient.  Does not appear we will be able to cross the occluded iliac lesion in a conventional manner and interventional techniques would not allow  for treatment of the ostial profunda lesions.    Disposition: Patient was taken to the recovery room in stable condition having tolerated the procedure well.  Belenda Cruise Raeqwon Lux 10/14/2021,2:26 PM

## 2021-10-14 NOTE — Interval H&P Note (Signed)
History and Physical Interval Note:  10/14/2021 10:55 AM  Garrett Moore  has presented today for surgery, with the diagnosis of R Iliac thrombectomy    Need Andy/GORE   Mark/PENUMBRA BARD/Brandon occluded right limb.  The various methods of treatment have been discussed with the patient and family. After consideration of risks, benefits and other options for treatment, the patient has consented to  Procedure(s): PERIPHERAL VASCULAR THROMBECTOMY (Right) PELVIC ANGIOGRAPHY (Right) as a surgical intervention.  The patient's history has been reviewed, patient examined, no change in status, stable for surgery.  I have reviewed the patient's chart and labs.  Questions were answered to the patient's satisfaction.     [name]

## 2021-10-14 NOTE — Anesthesia Postprocedure Evaluation (Signed)
Anesthesia Post Note  Patient: Garrett Moore  Procedure(s) Performed: PERIPHERAL VASCULAR THROMBECTOMY (Right) PELVIC ANGIOGRAPHY (Right)  Patient location during evaluation: PACU Anesthesia Type: General Level of consciousness: awake and alert Pain management: pain level controlled Vital Signs Assessment: post-procedure vital signs reviewed and stable Respiratory status: spontaneous breathing, nonlabored ventilation, respiratory function stable and patient connected to nasal cannula oxygen Cardiovascular status: blood pressure returned to baseline and stable Postop Assessment: no apparent nausea or vomiting Anesthetic complications: no   No notable events documented.   Last Vitals:  Vitals:   10/14/21 1545 10/14/21 1600  BP: 110/77 (!) 144/72  Pulse: 77 77  Resp: 16 19  Temp:    SpO2: 96% 95%    Last Pain:  Vitals:   10/14/21 1600  TempSrc:   PainSc: 0-No pain                 Arita Miss

## 2021-10-16 ENCOUNTER — Encounter: Payer: Self-pay | Admitting: Vascular Surgery

## 2021-10-22 ENCOUNTER — Ambulatory Visit (INDEPENDENT_AMBULATORY_CARE_PROVIDER_SITE_OTHER): Payer: Medicare HMO | Admitting: Urology

## 2021-10-22 VITALS — BP 119/64 | HR 0 | Ht 71.0 in | Wt 185.0 lb

## 2021-10-22 DIAGNOSIS — R3129 Other microscopic hematuria: Secondary | ICD-10-CM | POA: Diagnosis not present

## 2021-10-22 LAB — URINALYSIS, COMPLETE
Bilirubin, UA: NEGATIVE
Glucose, UA: NEGATIVE
Ketones, UA: NEGATIVE
Nitrite, UA: POSITIVE — AB
Protein,UA: NEGATIVE
Specific Gravity, UA: 1.02 (ref 1.005–1.030)
Urobilinogen, Ur: 0.2 mg/dL (ref 0.2–1.0)
pH, UA: 5 (ref 5.0–7.5)

## 2021-10-22 LAB — MICROSCOPIC EXAMINATION: WBC, UA: >30 /hpf — AB (ref 0–5)

## 2021-10-22 MED ORDER — SULFAMETHOXAZOLE-TRIMETHOPRIM 800-160 MG PO TABS: 1.0000 | ORAL_TABLET | Freq: Two times a day (BID) | ORAL | 0 refills | Status: DC

## 2021-10-22 NOTE — Progress Notes (Signed)
10/22/21 2:30 PM   Garrett Moore [birth year] [id number]  Referring provider:  [name], MD [address] [place],  [place] [address] Chief Complaint  Patient presents with   Cysto      HPI: Garrett Moore is a 81 y.o.male with a personal history of left UVJ stone , urinary retention, and BPH who returns today for a cystoscopy/ TRUS further assessment of his urinary issues.  He was taken to the OR on 08/12/2020 for a complex ureteroscopy for treatment of chronically obstructing distal UVJ stone in the setting of a complete left duplicated collecting system.Stone analysis revealed a 100% calcium oxalate monohydrate stone.  RUS on 09/24/2020 visualized resolution of bilateral hydronephrosis and otherwise unremarkable.  He underwent a CT angio, abdomen and pelvis on 09/24/2021 that visualized mild diffuse thinning and renal cortices. 2 simple left renal cyst measuring up to 2.2 cm.   KUB on 10/02/2021 visualized left renal vascular calcifications unchanged compared to prior CT.   He reports that he recently had surgery and had a catheter placed, he reports burning after foley removal.  Is no longer burning.  UA today has >30 WBCs, nitrite positive with many bacteria.    PMH: Past Medical History:  Diagnosis Date   Aneurysm of infrarenal abdominal aorta ([place]) 05/10/2020   s/p EVAR 07/03/2020   Aortic atherosclerosis ([place])    Aortic valvar stenosis 02/08/2018   Moderate   Atrial fibrillation (HCC)    Basal cell carcinoma    Face   BPH (benign prostatic hyperplasia)    Carotid artery stenosis    CKD (chronic kidney disease), stage III (HCC)    COPD (chronic obstructive pulmonary disease) (HCC)    Coronary artery disease    DVT (deep venous thrombosis) ([place])    age 51, leg after Tonsillectomy   Family history of adverse reaction to anesthesia    mother under anesthesia for extended period for AAA memory problems/dementia after   Grade I diastolic dysfunction  63/7858   Heart murmur    History of kidney stones    Hyperlipidemia    Myocardial infarction ([place]) 07/1983   IABP overnight then removed and nothing else.   OA (osteoarthritis)    Peripheral vascular disease (HCC)    Presence of arterial stent 07/03/2020   BILATERAL iliac arteries   Ureteral calculus, left 07/2020    Surgical History: Past Surgical History:  Procedure Laterality Date   ABDOMINAL AORTIC ANEURYSM REPAIR     CARDIAC CATHETERIZATION  08/14/1983   CARDIOVASCULAR STRESS TEST  01/23/2011   normal myocardial perfusion scan demonstrating an attenuation artifact in the inferior region of the myocardium. no ischemia or infarct/scar seen in remaining myocardium   CAROTID DUPLEX  05/21/2006   right bulb-0-49% diameter reduction; left bulb and proximal ICA-0-49% diameter reduction   CATARACT EXTRACTION, BILATERAL  01/2019   CORONARY ANGIOPLASTY  07/1983   PTCA Prox RCA   CYSTOSCOPY W/ RETROGRADES  08/12/2020   Procedure: CYSTOSCOPY WITH RETROGRADE PYELOGRAM;  Surgeon: Hollice Espy, MD;  Location: [facility];  Service: Urology;;   CYSTOSCOPY/URETEROSCOPY/HOLMIUM LASER/STENT PLACEMENT Left 08/12/2020   Procedure: CYSTOSCOPY/URETEROSCOPY/HOLMIUM LASER/STENT PLACEMENT;  Surgeon: Hollice Espy, MD;  Location: [facility];  Service: Urology;  Laterality: Left;   DOPPLER ECHOCARDIOGRAPHY  07/21/2012   EF 50-55%, aortic valve noncoronary cusp mobility was severely restricted.   ENDOVASCULAR REPAIR/STENT GRAFT N/A 07/03/2020   Procedure: ENDOVASCULAR REPAIR/STENT GRAFT;  Surgeon: Katha Cabal, MD;  Location: [facility];  Service:  Cardiovascular;  Laterality: N/A;   EYE SURGERY Bilateral    cataracts   PELVIC ANGIOGRAPHY Right 10/14/2021   Procedure: PELVIC ANGIOGRAPHY;  Surgeon: Katha Cabal, MD;  Location: [facility];  Service: Cardiovascular;  Laterality: Right;   PERIPHERAL VASCULAR THROMBECTOMY Right 10/14/2021   Procedure: PERIPHERAL VASCULAR THROMBECTOMY;   Surgeon: Katha Cabal, MD;  Location: [facility];  Service: Cardiovascular;  Laterality: Right;   ROTATOR CUFF REPAIR Left    TEMPORARY PACEMAKER  07/1983   TONSILLECTOMY     TOTAL HIP ARTHROPLASTY Left 02/21/2019   Procedure: TOTAL HIP ARTHROPLASTY ANTERIOR APPROACH;  Surgeon: Paralee Cancel, MD;  Location: [facility];  Service: Orthopedics;  Laterality: Left;  70 mins   VITRECTOMY Bilateral     Home Medications:  Allergies as of 10/22/2021       Reactions   Quinolones Other (See Comments)   Other reaction(s): Has aortic root dilatation        Medication List        Accurate as of October 22, 2021  2:30 PM. If you have any questions, ask your nurse or doctor.          STOP taking these medications    albuterol 108 (90 Base) MCG/ACT inhaler Commonly known as: VENTOLIN HFA   clopidogrel 75 MG tablet Commonly known as: PLAVIX   tamsulosin 0.4 MG Caps capsule Commonly known as: Flomax       TAKE these medications    aspirin EC 81 MG tablet Take 81 mg by mouth daily. Swallow whole.   multivitamin with minerals Tabs tablet Take 1 tablet by mouth daily.   nicotine polacrilex 2 MG lozenge Commonly known as: COMMIT Take 2 mg by mouth as needed for smoking cessation.   simvastatin 80 MG tablet Commonly known as: ZOCOR Take 80 mg by mouth daily at 6 PM.   sulfamethoxazole-trimethoprim 800-160 MG tablet Commonly known as: BACTRIM DS Take 1 tablet by mouth every 12 (twelve) hours.        Allergies:  Allergies  Allergen Reactions   Quinolones Other (See Comments)    Other reaction(s): Has aortic root dilatation    Family History: Family History  Problem Relation Age of Onset   Heart Problems Mother    AAA (abdominal aortic aneurysm) Mother     Social History:  reports that he quit smoking about 19 years ago. His smoking use included cigarettes. He has a 100.00 pack-year smoking history. He has never used smokeless tobacco. He reports that he  does not currently use alcohol. He reports that he does not use drugs.   Physical Exam: BP 119/64   Pulse (!) 0   Ht '5\' 11"'$  (1.803 m)   Wt 185 lb (83.9 kg)   BMI 25.80 kg/m   Constitutional:  Alert and oriented, No acute distress. HEENT: [place] AT, moist mucus membranes.  Trachea midline, no masses. Cardiovascular: No clubbing, cyanosis, or edema. Respiratory: Normal respiratory effort, no increased work of breathing. Skin: No rashes, bruises or suspicious lesions. Neurologic: Grossly intact, no focal deficits, moving all 4 extremities. Psychiatric: Normal mood and affect.  Laboratory Data: Lab Results  Component Value Date   CREATININE 1.39 (H) 10/14/2021    Urinalysis  >30 WBCs, nitrite positive with many bacteria    Assessment & Plan:   Acute cystitis  - UA suspicious for infection after post-op foley cath removal.  - Urine sent for culture will treat in the interim with Bactrim DS x 7  days -f/u Ucx  2. History of urinary retention  - Cystoscopy deferred today     REschedule cysto/ TRUS  I,Kailey Littlejohn,acting as a scribe for Hollice Espy, MD.,have documented all relevant documentation on the behalf of Hollice Espy, MD,as directed by  Hollice Espy, MD while in the presence of Hollice Espy, MD.  I have reviewed the above documentation for accuracy and completeness, and I agree with the above.   Hollice Espy, MD    [facility] [address] [place], [place] [address] [phone]

## 2021-10-26 LAB — CULTURE, URINE COMPREHENSIVE

## 2021-10-31 ENCOUNTER — Encounter (INDEPENDENT_AMBULATORY_CARE_PROVIDER_SITE_OTHER): Payer: Self-pay | Admitting: Nurse Practitioner

## 2021-10-31 ENCOUNTER — Ambulatory Visit (INDEPENDENT_AMBULATORY_CARE_PROVIDER_SITE_OTHER): Payer: Medicare HMO | Admitting: Nurse Practitioner

## 2021-10-31 VITALS — BP 124/64 | HR 68 | Resp 16 | Wt 180.0 lb

## 2021-10-31 DIAGNOSIS — I70211 Atherosclerosis of native arteries of extremities with intermittent claudication, right leg: Secondary | ICD-10-CM

## 2021-10-31 DIAGNOSIS — E782 Mixed hyperlipidemia: Secondary | ICD-10-CM | POA: Diagnosis not present

## 2021-11-01 ENCOUNTER — Encounter (INDEPENDENT_AMBULATORY_CARE_PROVIDER_SITE_OTHER): Payer: Self-pay | Admitting: Nurse Practitioner

## 2021-11-01 NOTE — Progress Notes (Signed)
Subjective:    Patient ID: Garrett Moore, male    DOB: 03/05/1941, 81 y.o.   MRN: [id number] Chief Complaint  Patient presents with   Follow-up    [facility] 2 week follow up    Garrett Moore is an 81 year old male who returns to the office today for follow-up discussion after recent lower extremity angiogram.  The patient had thrombosis of his right external iliac artery.  However it was determined during angiogram that they were unable to complete his intervention endovascularly.  The patient will ultimately require surgical intervention.  Currently the patient notes that he has claudication-like symptoms with extensive activity but otherwise denies any rest pain.  He denies any open wounds or ulcerations.    Review of Systems  Cardiovascular:        Claudication  All other systems reviewed and are negative.      Objective:   Physical Exam Vitals reviewed.  HENT:     Head: Normocephalic.  Cardiovascular:     Rate and Rhythm: Normal rate.  Pulmonary:     Effort: Pulmonary effort is normal.  Skin:    General: Skin is warm and dry.  Neurological:     Mental Status: He is alert and oriented to person, place, and time.  Psychiatric:        Mood and Affect: Mood normal.        Behavior: Behavior normal.        Thought Content: Thought content normal.        Judgment: Judgment normal.     BP 124/64 (BP Location: Left Arm)   Pulse 68   Resp 16   Wt 180 lb (81.6 kg)   BMI 25.10 kg/m   Past Medical History:  Diagnosis Date   Aneurysm of infrarenal abdominal aorta ([place]) 05/10/2020   s/p EVAR 07/03/2020   Aortic atherosclerosis (HCC)    Aortic valvar stenosis 02/08/2018   Moderate   Atrial fibrillation (HCC)    Basal cell carcinoma    Face   BPH (benign prostatic hyperplasia)    Carotid artery stenosis    CKD (chronic kidney disease), stage III (HCC)    COPD (chronic obstructive pulmonary disease) (HCC)    Coronary artery disease    DVT (deep venous thrombosis)  ([place])    age 71, leg after Tonsillectomy   Family history of adverse reaction to anesthesia    mother under anesthesia for extended period for AAA memory problems/dementia after   Grade I diastolic dysfunction 67/1245   Heart murmur    History of kidney stones    Hyperlipidemia    Myocardial infarction ([place]) 07/1983   IABP overnight then removed and nothing else.   OA (osteoarthritis)    Peripheral vascular disease (HCC)    Presence of arterial stent 07/03/2020   BILATERAL iliac arteries   Ureteral calculus, left 07/2020    Social History   Socioeconomic History   Marital status: Married    Spouse name: [name]   Number of children: 2   Years of education: Not on file   Highest education level: Not on file  Occupational History   Occupation: Freight forwarder of [employer]    Comment: retired  Tobacco Use   Smoking status: Former    Packs/day: 2.00    Years: 50.00    Total pack years: 100.00    Types: Cigarettes    Quit date: 2004    Years since quitting: 19.6   Smokeless tobacco: Never   Tobacco  comments:    quit 17 years ago  Vaping Use   Vaping Use: Never used  Substance and Sexual Activity   Alcohol use: Not Currently   Drug use: Never   Sexual activity: Not on file  Other Topics Concern   Not on file  Social History Narrative   Patient lives with wife in his home.  He feels safe in his home.   No stairs.    Social Determinants of Health   Financial Resource Strain: Not on file  Food Insecurity: Not on file  Transportation Needs: Not on file  Physical Activity: Not on file  Stress: Not on file  Social Connections: Not on file  Intimate Partner Violence: Not on file    Past Surgical History:  Procedure Laterality Date   ABDOMINAL AORTIC ANEURYSM REPAIR     CARDIAC CATHETERIZATION  08/14/1983   CARDIOVASCULAR STRESS TEST  01/23/2011   normal myocardial perfusion scan demonstrating an attenuation artifact in the inferior region of the myocardium. no  ischemia or infarct/scar seen in remaining myocardium   CAROTID DUPLEX  05/21/2006   right bulb-0-49% diameter reduction; left bulb and proximal ICA-0-49% diameter reduction   CATARACT EXTRACTION, BILATERAL  01/2019   CORONARY ANGIOPLASTY  07/1983   PTCA Prox RCA   CYSTOSCOPY W/ RETROGRADES  08/12/2020   Procedure: CYSTOSCOPY WITH RETROGRADE PYELOGRAM;  Surgeon: Hollice Espy, MD;  Location: [facility];  Service: Urology;;   CYSTOSCOPY/URETEROSCOPY/HOLMIUM LASER/STENT PLACEMENT Left 08/12/2020   Procedure: CYSTOSCOPY/URETEROSCOPY/HOLMIUM LASER/STENT PLACEMENT;  Surgeon: Hollice Espy, MD;  Location: [facility] OR[facility];  Service: Urology;  Laterality: Left;   DOPPLER ECHOCARDIOGRAPHY  07/21/2012   EF 50-55%, aortic valve noncoronary cusp mobility was severely restricted.   ENDOVASCULAR REPAIR/STENT GRAFT N/A 07/03/2020   Procedure: ENDOVASCULAR REPAIR/STENT GRAFT;  Surgeon: Katha Cabal, MD;  Location: [place] [facility];  Service: Cardiovascular;  Laterality: N/A;   EYE SURGERY Bilateral    cataracts   PELVIC ANGIOGRAPHY Right 10/14/2021   Procedure: PELVIC ANGIOGRAPHY;  Surgeon: Katha Cabal, MD;  Location: [place] [facility];  Service: Cardiovascular;  Laterality: Right;   PERIPHERAL VASCULAR THROMBECTOMY Right 10/14/2021   Procedure: PERIPHERAL VASCULAR THROMBECTOMY;  Surgeon: Katha Cabal, MD;  Location: [facility];  Service: Cardiovascular;  Laterality: Right;   ROTATOR CUFF REPAIR Left    TEMPORARY PACEMAKER  07/1983   TONSILLECTOMY     TOTAL HIP ARTHROPLASTY Left 02/21/2019   Procedure: TOTAL HIP ARTHROPLASTY ANTERIOR APPROACH;  Surgeon: Paralee Cancel, MD;  Location: [facility];  Service: Orthopedics;  Laterality: Left;  70 mins   VITRECTOMY Bilateral     Family History  Problem Relation Age of Onset   Heart Problems Mother    AAA (abdominal aortic aneurysm) Mother     Allergies  Allergen Reactions   Quinolones Other (See Comments)    Other reaction(s): Has  aortic root dilatation       Latest Ref Rng & Units 08/28/2020    6:01 PM 08/14/2020    6:12 PM 07/04/2020    3:31 AM  CBC  WBC 4.0 - 10.5 K/uL 18.9  15.2  16.8   Hemoglobin 13.0 - 17.0 g/dL 10.8  11.0  11.7   Hematocrit 39.0 - 52.0 % 33.5  32.9  34.9   Platelets 150 - 400 K/uL 313  329  234       CMP     Component Value Date/Time   NA 134 (L) 08/28/2020 1801   K 3.7 08/28/2020 1801  CL 104 08/28/2020 1801   CO2 23 08/28/2020 1801   GLUCOSE 131 (H) 08/28/2020 1801   BUN 19 10/14/2021 1132   CREATININE 1.39 (H) 10/14/2021 1132   CALCIUM 8.2 (L) 08/28/2020 1801   PROT 7.2 08/14/2020 1812   ALBUMIN 3.7 08/14/2020 1812   AST 31 08/14/2020 1812   ALT 13 08/14/2020 1812   ALKPHOS 67 08/14/2020 1812   BILITOT 0.9 08/14/2020 1812   GFRNONAA 51 (L) 10/14/2021 1132   GFRAA >60 02/22/2019 0236     No results found.     Assessment & Plan:   1. Atherosclerosis of native artery of right lower extremity with intermittent claudication ([place]) I discussed with the patient the necessary open surgical repair.  He would require a left to right femorofemoral bypass graft with bilateral femoral endarterectomies including a left external iliac stent with a right SFA stent.  We discussed the surgery.  We discussed the risk, benefits and alternatives.  At this time the patient wishes not to move forward as he does not have any limb threatening symptoms.  We discussed his current vascular status and that while he currently does not have significant symptoms he could possibly develop significant symptoms and delaying treatment may pose a problem in the future.  Based on this discussion we will have the patient return in 1 month with ABIs to discuss further treatment.  2. Mixed hyperlipidemia Continue statin as ordered and reviewed, no changes at this time    Current Outpatient Medications on File Prior to Visit  Medication Sig Dispense Refill   aspirin EC 81 MG tablet Take 81 mg by mouth daily.  Swallow whole.     Multiple Vitamin (MULTIVITAMIN WITH MINERALS) TABS tablet Take 1 tablet by mouth daily.     nicotine polacrilex (COMMIT) 2 MG lozenge Take 2 mg by mouth as needed for smoking cessation.     simvastatin (ZOCOR) 80 MG tablet Take 80 mg by mouth daily at 6 PM.      sulfamethoxazole-trimethoprim (BACTRIM DS) 800-160 MG tablet Take 1 tablet by mouth every 12 (twelve) hours. 14 tablet 0   Current Facility-Administered Medications on File Prior to Visit  Medication Dose Route Frequency Provider Last Rate Last Admin   cefTRIAXone (ROCEPHIN) injection 1 g  1 g Intramuscular Q24H Hollice Espy, MD   1 g at 08/21/20 1128    There are no Patient Instructions on file for this visit. No follow-ups on file.   Kris Hartmann, NP

## 2021-11-03 ENCOUNTER — Telehealth: Payer: Self-pay | Admitting: Family Medicine

## 2021-11-03 NOTE — Telephone Encounter (Signed)
Patient called and states he was on Bactrim for 1 week. After 1 day of taking the medication he began to get a rash that started at his foot and he continued taking the medication thinking he had a bug bite. He states he completed the course of antibiotic but the ras continued up both legs an on his back. He states now that he has completed the course of medication the rash has started to go away. I have added this medication to the allergy list.

## 2021-11-05 ENCOUNTER — Encounter: Payer: Self-pay | Admitting: Urology

## 2021-11-05 ENCOUNTER — Ambulatory Visit (INDEPENDENT_AMBULATORY_CARE_PROVIDER_SITE_OTHER): Payer: Medicare HMO | Admitting: Urology

## 2021-11-05 ENCOUNTER — Other Ambulatory Visit: Payer: Self-pay | Admitting: Urology

## 2021-11-05 VITALS — BP 126/78 | HR 75 | Ht 71.0 in | Wt 180.0 lb

## 2021-11-05 DIAGNOSIS — Z87898 Personal history of other specified conditions: Secondary | ICD-10-CM

## 2021-11-05 DIAGNOSIS — N401 Enlarged prostate with lower urinary tract symptoms: Secondary | ICD-10-CM | POA: Diagnosis not present

## 2021-11-05 DIAGNOSIS — N138 Other obstructive and reflux uropathy: Secondary | ICD-10-CM | POA: Diagnosis not present

## 2021-11-05 DIAGNOSIS — R339 Retention of urine, unspecified: Secondary | ICD-10-CM

## 2021-11-05 DIAGNOSIS — R3129 Other microscopic hematuria: Secondary | ICD-10-CM | POA: Diagnosis not present

## 2021-11-05 NOTE — Progress Notes (Signed)
Surgical Physician Order Form [facility] [place]  * Scheduling expectation : Next Available  *Length of Case:   *Clearance needed: no  *Anticoagulation Instructions: Hold all anticoagulants  *Aspirin Instructions: Ok to continue Aspirin  *Post-op visit Date/Instructions:  4-6 week w/PVR  *Diagnosis: BPH w/BOO  *Procedure: Urolift    Additional orders: N/A  -Admit type: OUTpatient  -Anesthesia: MAC  -VTE Prophylaxis Standing Order SCD's       Other:   -Standing Lab Orders Per Anesthesia    Lab other: None  -Standing Test orders EKG/Chest x-ray per Anesthesia       Test other:   - Medications:  Ancef 2gm IV  -Other orders:   SheN/A

## 2021-11-05 NOTE — Progress Notes (Signed)
11/05/2021 CC:  Chief Complaint  Patient presents with   Cysto    TRUS     HPI: Garrett Moore is a 81 y.o. male with a personal history of left UVJ stone , urinary retention, and BPH who returns today for a cystoscopy/ TRUS further assessment of his urinary issues.   He was taken to the OR on 08/12/2020 for a complex ureteroscopy for treatment of chronically obstructing distal UVJ stone in the setting of a complete left duplicated collecting system.Stone analysis revealed a 100% calcium oxalate monohydrate stone.   RUS on 09/24/2020 visualized resolution of bilateral hydronephrosis and otherwise unremarkable.   He underwent a CT angio, abdomen and pelvis on 09/24/2021 that visualized mild diffuse thinning and renal cortices. 2 simple left renal cyst measuring up to 2.2 cm.    KUB on 10/02/2021 visualized left renal vascular calcifications unchanged compared to prior CT.   His UA on 10/22/2021 was suspicious with > 30 WBC's, nitrite positive with many bacteria. Cystoscopy was deferred and he was treated and the interim with bactrim DS x7days. Urine culture grew Enterobacter cloacae complex that was resistant to amoxicillin cefazolin, cefuroxime, and otherwise pan sensitive.  Vitals:   11/05/21 1414  BP: 126/78  Pulse: 75   NED. A&Ox3.   No respiratory distress   Abd soft, NT, ND Normal phallus with bilateral descended testicles  Cystoscopy Procedure Note  Patient identification was confirmed, informed consent was obtained, and patient was prepped using Betadine solution.  Lidocaine jelly was administered per urethral meatus.     Pre-Procedure: - Inspection reveals a normal caliber ureteral meatus.  Procedure: The flexible cystoscope was introduced without difficulty - Very mild fossa navicularis stricture, mild bulbar urethral narrowing - Enlarged prostate bilobar coaptation - Normal bladder neck - Bilateral ureteral orifices identified - Bladder mucosa  reveals no  ulcers, tumors, or lesions - No bladder stones - Moderate trabeculation  Retroflexion shows unremarkable   Post-Procedure: - Patient tolerated the procedure well   Prostate transrectal ultrasound sizing   Informed consent was obtained after discussing risks/benefits of the procedure.  A time out was performed to ensure correct patient identity.   Pre-Procedure: -Transrectal probe was placed without difficulty -Transrectal Ultrasound performed revealing a 58.03 gm prostate measuring 3.96 x 4.76 x 5.88 cm (length) -No significant hypoechoic or median lobe noted   Assessment/ Plan:   1. Benign prostatic hyperplasia with urinary obstruction Prostate is relatively small however he does have moderate trabeculation and evidence of chronic outlet obstruction  He has not tolerated medications and does not desire to continue these, interested in an outlet procedure.  We discussed various options including TURP versus HoLEP versus UroLift.  He is most interested in UroLift after discussing risk and benefits of each.  He was given literature about UroLift today.  We discussed the efficacy rate, risk of irritative voiding symptoms, possibility of the need for Foley catheter albeit relatively low if he is able to void in the postoperative holding area, pelvic pain, clip malfunction or migration, infection and hematuria.  He will need clearance.  All questions answered.  - Urinalysis, Complete  2. History of urinary retention As above   [employer] as a scribe for Hollice Espy, MD.,have documented all relevant documentation on the behalf of Hollice Espy, MD,as directed by  Hollice Espy, MD while in the presence of Hollice Espy, MD.  I have reviewed the above documentation for accuracy and completeness, and I agree with the above.   Caryl Pina  Erlene Quan, MD

## 2021-11-06 LAB — URINALYSIS, COMPLETE
Bilirubin, UA: NEGATIVE
Glucose, UA: NEGATIVE
Ketones, UA: NEGATIVE
Leukocytes,UA: NEGATIVE
Nitrite, UA: NEGATIVE
Protein,UA: NEGATIVE
Specific Gravity, UA: 1.025 (ref 1.005–1.030)
Urobilinogen, Ur: 0.2 mg/dL (ref 0.2–1.0)
pH, UA: 5.5 (ref 5.0–7.5)

## 2021-11-06 LAB — MICROSCOPIC EXAMINATION: Bacteria, UA: NONE SEEN

## 2021-11-08 LAB — CULTURE, URINE COMPREHENSIVE

## 2021-11-12 ENCOUNTER — Telehealth: Payer: Self-pay

## 2021-11-12 NOTE — Telephone Encounter (Signed)
I spoke with [name]. We have discussed possible surgery dates and Monday October 2nd, 2023 was agreed upon by all parties. Patient given information about surgery date, what to expect pre-operatively and post operatively.  We discussed that a Pre-Admission Testing office will be calling to set up the pre-op visit that will take place prior to surgery, and that these appointments are typically done over the phone with a Pre-Admissions RN.  Informed patient that our office will communicate any additional care to be provided after surgery. Patients questions or concerns were discussed during our call. Advised to call our office should there be any additional information, questions or concerns that arise. Patient verbalized understanding.

## 2021-11-12 NOTE — Progress Notes (Signed)
[facility] Posting Form   Surgery Date/Time: Date: 12/22/2021  Surgeon: Dr. Hollice Espy, MD  Surgery Location: Day Surgery  Inpt ( No  )   Outpt (Yes)   Obs ( No  )   Diagnosis: N40.1, N13.8 Benign Prostatic Hyperplasia with Urinary Obstruction  -CPT: 62863, [phone]  Surgery: Cystoscopy with Insertion of Urolift  Stop Anticoagulations: Yes, may continue ASA  Cardiac/Medical/Pulmonary Clearance needed: no  *Orders entered into EPIC  Date: 11/12/21   *Case booked in EPIC  Date: 11/12/21  *Notified pt of Surgery: Date: 11/12/21  PRE-OP UA & CX: no  *Placed into Prior Authorization Work Que Date: 11/12/21   Assistant/laser/rep:No

## 2021-12-03 ENCOUNTER — Other Ambulatory Visit (INDEPENDENT_AMBULATORY_CARE_PROVIDER_SITE_OTHER): Payer: Self-pay | Admitting: Nurse Practitioner

## 2021-12-03 DIAGNOSIS — I745 Embolism and thrombosis of iliac artery: Secondary | ICD-10-CM

## 2021-12-03 DIAGNOSIS — I70211 Atherosclerosis of native arteries of extremities with intermittent claudication, right leg: Secondary | ICD-10-CM

## 2021-12-08 ENCOUNTER — Ambulatory Visit (INDEPENDENT_AMBULATORY_CARE_PROVIDER_SITE_OTHER): Payer: Medicare HMO

## 2021-12-08 ENCOUNTER — Encounter (INDEPENDENT_AMBULATORY_CARE_PROVIDER_SITE_OTHER): Payer: Self-pay | Admitting: Vascular Surgery

## 2021-12-08 ENCOUNTER — Ambulatory Visit (INDEPENDENT_AMBULATORY_CARE_PROVIDER_SITE_OTHER): Payer: Medicare HMO | Admitting: Vascular Surgery

## 2021-12-08 VITALS — BP 127/63 | HR 80 | Resp 16 | Wt 179.8 lb

## 2021-12-08 DIAGNOSIS — I7143 Infrarenal abdominal aortic aneurysm, without rupture: Secondary | ICD-10-CM | POA: Diagnosis not present

## 2021-12-08 DIAGNOSIS — I745 Embolism and thrombosis of iliac artery: Secondary | ICD-10-CM

## 2021-12-08 DIAGNOSIS — I70211 Atherosclerosis of native arteries of extremities with intermittent claudication, right leg: Secondary | ICD-10-CM

## 2021-12-08 DIAGNOSIS — E782 Mixed hyperlipidemia: Secondary | ICD-10-CM

## 2021-12-08 DIAGNOSIS — I2510 Atherosclerotic heart disease of native coronary artery without angina pectoris: Secondary | ICD-10-CM

## 2021-12-08 DIAGNOSIS — J449 Chronic obstructive pulmonary disease, unspecified: Secondary | ICD-10-CM | POA: Diagnosis not present

## 2021-12-08 NOTE — Progress Notes (Signed)
MRN : [id number]  Garrett Moore is a 81 y.o. ([birth year]) male who presents with chief complaint of check circulation.  History of Present Illness:   The patient returns to the office for followup and review of the noninvasive studies.   Previous angiogram 10/14/2021: The flow divider within the main body of the EVAR device is widely patent.  The left limb and left external iliac arteries are widely patent.  On the right the proximal portion of the iliac branch device is patent as is the stent leading into the internal iliac.  The internal iliac distally has extensive collaterals and is widely patent.  There is a flush occlusion of the external iliac stent.  Distally the external iliac artery is patent but moderate to severely diseased..  There have been no interval changes in lower extremity symptoms. He is now walking several miles a day and the pain that he was having in his hip is gone.  No  development of rest pain symptoms. No new ulcers or wounds have occurred since the last visit.  There have been no significant changes to the patient's overall health care.  The patient denies amaurosis fugax or recent TIA symptoms. There are no documented recent neurological changes noted. There is no history of DVT, PE or superficial thrombophlebitis. The patient denies recent episodes of angina or shortness of breath.   ABI Rt=0.68 and Lt=1.02  (previous ABI's Rt=0.75 and Lt=1.12)   Current Meds  Medication Sig   aspirin EC 81 MG tablet Take 81 mg by mouth daily. Swallow whole.   Multiple Vitamin (MULTIVITAMIN WITH MINERALS) TABS tablet Take 1 tablet by mouth daily.   nicotine polacrilex (COMMIT) 2 MG lozenge Take 2 mg by mouth as needed for smoking cessation.   simvastatin (ZOCOR) 80 MG tablet Take 80 mg by mouth daily at 6 PM.    Current Facility-Administered Medications for the 12/08/21 encounter (Office Visit) with Delana Meyer, Dolores Lory, MD  Medication   cefTRIAXone  (ROCEPHIN) injection 1 g    Past Medical History:  Diagnosis Date   Aneurysm of infrarenal abdominal aorta ([place]) 05/10/2020   s/p EVAR 07/03/2020   Aortic atherosclerosis ([place])    Aortic valvar stenosis 02/08/2018   Moderate   Atrial fibrillation (HCC)    Basal cell carcinoma    Face   BPH (benign prostatic hyperplasia)    Carotid artery stenosis    CKD (chronic kidney disease), stage III (HCC)    COPD (chronic obstructive pulmonary disease) ([place])    Coronary artery disease    DVT (deep venous thrombosis) ([place])    age 64, leg after Tonsillectomy   Family history of adverse reaction to anesthesia    mother under anesthesia for extended period for AAA memory problems/dementia after   Grade I diastolic dysfunction 01/9416   Heart murmur    History of kidney stones    Hyperlipidemia    Myocardial infarction ([place]) 07/1983   IABP overnight then removed and nothing else.   OA (osteoarthritis)    Peripheral vascular disease (HCC)    Presence of arterial stent 07/03/2020   BILATERAL iliac arteries   Ureteral calculus, left 07/2020    Past Surgical History:  Procedure Laterality Date   ABDOMINAL AORTIC ANEURYSM REPAIR     CARDIAC CATHETERIZATION  08/14/1983   CARDIOVASCULAR STRESS TEST  01/23/2011   normal myocardial perfusion scan demonstrating an attenuation artifact in the inferior  region of the myocardium. no ischemia or infarct/scar seen in remaining myocardium   CAROTID DUPLEX  05/21/2006   right bulb-0-49% diameter reduction; left bulb and proximal ICA-0-49% diameter reduction   CATARACT EXTRACTION, BILATERAL  01/2019   CORONARY ANGIOPLASTY  07/1983   PTCA Prox RCA   CYSTOSCOPY W/ RETROGRADES  08/12/2020   Procedure: CYSTOSCOPY WITH RETROGRADE PYELOGRAM;  Surgeon: Hollice Espy, MD;  Location: [facility];  Service: Urology;;   CYSTOSCOPY/URETEROSCOPY/HOLMIUM LASER/STENT PLACEMENT Left 08/12/2020   Procedure: CYSTOSCOPY/URETEROSCOPY/HOLMIUM LASER/STENT PLACEMENT;  Surgeon:  Hollice Espy, MD;  Location: [facility];  Service: Urology;  Laterality: Left;   DOPPLER ECHOCARDIOGRAPHY  07/21/2012   EF 50-55%, aortic valve noncoronary cusp mobility was severely restricted.   ENDOVASCULAR REPAIR/STENT GRAFT N/A 07/03/2020   Procedure: ENDOVASCULAR REPAIR/STENT GRAFT;  Surgeon: Katha Cabal, MD;  Location: [facility];  Service: Cardiovascular;  Laterality: N/A;   EYE SURGERY Bilateral    cataracts   PELVIC ANGIOGRAPHY Right 10/14/2021   Procedure: PELVIC ANGIOGRAPHY;  Surgeon: Katha Cabal, MD;  Location: [facility];  Service: Cardiovascular;  Laterality: Right;   PERIPHERAL VASCULAR THROMBECTOMY Right 10/14/2021   Procedure: PERIPHERAL VASCULAR THROMBECTOMY;  Surgeon: Katha Cabal, MD;  Location: [place];  Service: Cardiovascular;  Laterality: Right;   ROTATOR CUFF REPAIR Left    TEMPORARY PACEMAKER  07/1983   TONSILLECTOMY     TOTAL HIP ARTHROPLASTY Left 02/21/2019   Procedure: TOTAL HIP ARTHROPLASTY ANTERIOR APPROACH;  Surgeon: Paralee Cancel, MD;  Location: [facility];  Service: Orthopedics;  Laterality: Left;  70 mins   VITRECTOMY Bilateral     Social History Social History   Tobacco Use   Smoking status: Former    Packs/day: 2.00    Years: 50.00    Total pack years: 100.00    Types: Cigarettes    Quit date: 2004    Years since quitting: 19.7   Smokeless tobacco: Never   Tobacco comments:    quit 17 years ago  Vaping Use   Vaping Use: Never used  Substance Use Topics   Alcohol use: Not Currently   Drug use: Never    Family History Family History  Problem Relation Age of Onset   Heart Problems Mother    AAA (abdominal aortic aneurysm) Mother     Allergies  Allergen Reactions   Quinolones Other (See Comments)    Other reaction(s): Has aortic root dilatation   Sulfa Antibiotics Rash     REVIEW OF SYSTEMS (Negative unless checked)  Constitutional: '[]'$ Weight loss  '[]'$ Fever  '[]'$ Chills Cardiac: '[]'$ Chest  pain   '[]'$ Chest pressure   '[]'$ Palpitations   '[]'$ Shortness of breath when laying flat   '[]'$ Shortness of breath with exertion. Vascular:  '[x]'$ Pain in legs with walking   '[]'$ Pain in legs at rest  '[]'$ History of DVT   '[]'$ Phlebitis   '[]'$ Swelling in legs   '[]'$ Varicose veins   '[]'$ Non-healing ulcers Pulmonary:   '[]'$ Uses home oxygen   '[]'$ Productive cough   '[]'$ Hemoptysis   '[]'$ Wheeze  '[]'$ COPD   '[]'$ Asthma Neurologic:  '[]'$ Dizziness   '[]'$ Seizures   '[]'$ History of stroke   '[]'$ History of TIA  '[]'$ Aphasia   '[]'$ Vissual changes   '[]'$ Weakness or numbness in arm   '[]'$ Weakness or numbness in leg Musculoskeletal:   '[]'$ Joint swelling   '[]'$ Joint pain   '[]'$ Low back pain Hematologic:  '[]'$ Easy bruising  '[]'$ Easy bleeding   '[]'$ Hypercoagulable state   '[]'$ Anemic Gastrointestinal:  '[]'$ Diarrhea   '[]'$ Vomiting  '[]'$ Gastroesophageal reflux/heartburn   '[]'$ Difficulty swallowing. Genitourinary:  '[]'$   Chronic kidney disease   '[]'$ Difficult urination  '[]'$ Frequent urination   '[]'$ Blood in urine Skin:  '[]'$ Rashes   '[]'$ Ulcers  Psychological:  '[]'$ History of anxiety   '[]'$  History of major depression.  Physical Examination  Vitals:   12/08/21 1052  BP: 127/63  Pulse: 80  Resp: 16  Weight: 179 lb 12.8 oz (81.6 kg)   Body mass index is 25.08 kg/m. Gen: WD/WN, NAD Head: [place]/AT, No temporalis wasting.  Ear/Nose/Throat: Hearing grossly intact, nares w/o erythema or drainage Eyes: PER, EOMI, sclera nonicteric.  Neck: Supple, no masses.  No bruit or JVD.  Pulmonary:  Good air movement, no audible wheezing, no use of accessory muscles.  Cardiac: RRR, normal S1, S2, no Murmurs. Vascular:  mild trophic changes, no open wounds Vessel Right Left  Radial Palpable Palpable  PT Not Palpable Palpable  DP Not Palpable  Palpable  Gastrointestinal: soft, non-distended. No guarding/no peritoneal signs.  Musculoskeletal: M/S 5/5 throughout.  No visible deformity.  Neurologic: CN 2-12 intact. Pain and light touch intact in extremities.  Symmetrical.  Speech is fluent. Motor exam as listed  above. Psychiatric: Judgment intact, Mood & affect appropriate for pt's clinical situation. Dermatologic: No rashes or ulcers noted.  No changes consistent with cellulitis.   CBC Lab Results  Component Value Date   WBC 18.9 (H) 08/28/2020   HGB 10.8 (L) 08/28/2020   HCT 33.5 (L) 08/28/2020   MCV 96.0 08/28/2020   PLT 313 08/28/2020    BMET    Component Value Date/Time   NA 134 (L) 08/28/2020 1801   K 3.7 08/28/2020 1801   CL 104 08/28/2020 1801   CO2 23 08/28/2020 1801   GLUCOSE 131 (H) 08/28/2020 1801   BUN 19 10/14/2021 1132   CREATININE 1.39 (H) 10/14/2021 1132   CALCIUM 8.2 (L) 08/28/2020 1801   GFRNONAA 51 (L) 10/14/2021 1132   GFRAA >60 02/22/2019 0236   CrCl cannot be calculated (Patient's most recent lab result is older than the maximum 21 days allowed.).  COAG Lab Results  Component Value Date   INR 1.1 06/20/2020    Radiology No results found.   Assessment/Plan 1. Atherosclerosis of native artery of right lower extremity with intermittent claudication (HCC)  Recommend:  The patient has evidence of atherosclerosis of the lower extremities with claudication.  The patient does not voice lifestyle limiting changes at this point in time.  In fact he has had significant improvement in his right leg symptoms  Noninvasive studies do not suggest clinically significant change.  No invasive studies, angiography or surgery at this time The patient should continue walking and begin a more formal exercise program.  The patient should continue antiplatelet therapy and aggressive treatment of the lipid abnormalities  No changes in the patient's medications at this time  Continued surveillance is indicated as atherosclerosis is likely to progress with time.    The patient will continue follow up with noninvasive studies as ordered.   - VAS [place] ABI WITH/WO TBI  2. Occlusion of right iliac artery (HCC)  Recommend:  The patient has evidence of atherosclerosis of  the lower extremities with claudication.  The patient does not voice lifestyle limiting changes at this point in time.   In fact he has had significant improvement in his right leg symptoms  Noninvasive studies do not suggest clinically significant change.  No invasive studies, angiography or surgery at this time The patient should continue walking and begin a more formal exercise program.  The patient should continue  antiplatelet therapy and aggressive treatment of the lipid abnormalities  No changes in the patient's medications at this time  Continued surveillance is indicated as atherosclerosis is likely to progress with time.    The patient will continue follow up with noninvasive studies as ordered.   - VAS [place] ABI WITH/WO TBI  3. Infrarenal abdominal aortic aneurysm (AAA) without rupture (HCC) Recommend:  Patient is status post successful endovascular repair of the AAA.   No further intervention is required at this time.   No endoleak is detected and the aneurysm sac is stable.  The patient will continue antiplatelet therapy as prescribed as well as aggressive management of hyperlipidemia. Exercise is again strongly encouraged.   However, endografts require continued surveillance with ultrasound or CT scan. This is mandatory to detect any changes that allow repressurization of the aneurysm sac.  The patient is informed that this would be asymptomatic.  The patient is reminded that lifelong routine surveillance is a necessity with an endograft. Patient will continue to follow-up at the specified interval with ultrasound of the aorta.   4. Atherosclerosis of native coronary artery of native heart without angina pectoris Continue cardiac and antihypertensive medications as already ordered and reviewed, no changes at this time.  Continue statin as ordered and reviewed, no changes at this time  Nitrates PRN for chest pain   5. Chronic obstructive pulmonary disease, unspecified COPD  type ([place]) Continue pulmonary medications and aerosols as already ordered, these medications have been reviewed and there are no changes at this time.    6. Mixed hyperlipidemia Continue statin as ordered and reviewed, no changes at this time     [name], MD  12/08/2021 11:00 AM

## 2021-12-15 ENCOUNTER — Telehealth: Payer: Self-pay | Admitting: *Deleted

## 2021-12-15 ENCOUNTER — Encounter
Admission: RE | Admit: 2021-12-15 | Discharge: 2021-12-15 | Disposition: A | Discharge status: Home / Self Care | Payer: Medicare HMO | Source: Ambulatory Visit | Attending: Urology | Admitting: Urology

## 2021-12-15 DIAGNOSIS — N1831 Chronic kidney disease, stage 3a: Secondary | ICD-10-CM

## 2021-12-15 DIAGNOSIS — Z01818 Encounter for other preprocedural examination: Secondary | ICD-10-CM

## 2021-12-15 DIAGNOSIS — I359 Nonrheumatic aortic valve disorder, unspecified: Secondary | ICD-10-CM

## 2021-12-15 DIAGNOSIS — I2510 Atherosclerotic heart disease of native coronary artery without angina pectoris: Secondary | ICD-10-CM

## 2021-12-15 DIAGNOSIS — J449 Chronic obstructive pulmonary disease, unspecified: Secondary | ICD-10-CM

## 2021-12-15 HISTORY — DX: Gastro-esophageal reflux disease without esophagitis: K21.9

## 2021-12-15 HISTORY — DX: Atherosclerosis of native arteries of extremities with intermittent claudication, unspecified extremity: I70.219

## 2021-12-15 NOTE — Telephone Encounter (Signed)
   Name: Garrett Moore  DOB: 1940/06/10  MRN: [id number]  Primary Cardiologist: Ida Rogue, MD   Preoperative team, please contact this patient and set up a phone call appointment for further preoperative risk assessment. Please obtain consent and complete medication review. Thank you for your help.  I confirm that guidance regarding antiplatelet and oral anticoagulation therapy has been completed and, if necessary, noted below (none requested). Pt does take ASA 81 mg daily.    Lenna Sciara, NP 12/15/2021, 5:04 PM [place]

## 2021-12-15 NOTE — Telephone Encounter (Signed)
Pt agreeable to plan of care for tele pre op appt 12/18/21 @ 2:20. Med rec and consent are done.      Patient Consent for Virtual Visit        Garrett Moore has provided verbal consent on 12/15/2021 for a virtual visit (video or telephone).   CONSENT FOR VIRTUAL VISIT FOR:  Garrett Moore  By participating in this virtual visit I agree to the following:  I hereby voluntarily request, consent and authorize [facility] and its employed or contracted physicians, physician assistants, nurse practitioners or other licensed health care professionals (the Practitioner), to provide me with telemedicine health care services (the "Services") as deemed necessary by the treating Practitioner. I acknowledge and consent to receive the Services by the Practitioner via telemedicine. I understand that the telemedicine visit will involve communicating with the Practitioner through live audiovisual communication technology and the disclosure of certain medical information by electronic transmission. I acknowledge that I have been given the opportunity to request an in-person assessment or other available alternative prior to the telemedicine visit and am voluntarily participating in the telemedicine visit.  I understand that I have the right to withhold or withdraw my consent to the use of telemedicine in the course of my care at any time, without affecting my right to future care or treatment, and that the Practitioner or I may terminate the telemedicine visit at any time. I understand that I have the right to inspect all information obtained and/or recorded in the course of the telemedicine visit and may receive copies of available information for a reasonable fee.  I understand that some of the potential risks of receiving the Services via telemedicine include:  Delay or interruption in medical evaluation due to technological equipment failure or disruption; Information transmitted may not be  sufficient (e.g. poor resolution of images) to allow for appropriate medical decision making by the Practitioner; and/or  In rare instances, security protocols could fail, causing a breach of personal health information.  Furthermore, I acknowledge that it is my responsibility to provide information about my medical history, conditions and care that is complete and accurate to the best of my ability. I acknowledge that Practitioner's advice, recommendations, and/or decision may be based on factors not within their control, such as incomplete or inaccurate data provided by me or distortions of diagnostic images or specimens that may result from electronic transmissions. I understand that the practice of medicine is not an exact science and that Practitioner makes no warranties or guarantees regarding treatment outcomes. I acknowledge that a copy of this consent can be made available to me via my patient portal ([facility]), or I can request a printed copy by calling the office of [facility].    I understand that my insurance will be billed for this visit.   I have read or had this consent read to me. I understand the contents of this consent, which adequately explains the benefits and risks of the Services being provided via telemedicine.  I have been provided ample opportunity to ask questions regarding this consent and the Services and have had my questions answered to my satisfaction. I give my informed consent for the services to be provided through the use of telemedicine in my medical care

## 2021-12-15 NOTE — Telephone Encounter (Signed)
Pt agreeable to plan of care for tele pre op appt 12/18/21 @ 2:20. Med rec and consent are done.

## 2021-12-15 NOTE — Telephone Encounter (Signed)
Request for pre-operative cardiac clearance Received: Today Garrett Kitchens, NP  P Cv Div Preop Callback Request for pre-operative cardiac clearance:     1. What type of surgery is being performed?  CYSTOSCOPY WITH INSERTION OF UROLIFT   2. When is this surgery scheduled?  12/22/2021     3. Type of clearance being requested (medical, pharmacy, both).  MEDICAL     4. Are there any medications that need to be held prior to surgery?  NONE   5. Practice name and name of physician performing surgery?  Performing surgeon: Dr. Hollice Espy, MD  Requesting clearance: Honor Loh, FNP-C       6. Anesthesia type (none, local, MAC, general)?  MAC   7. What is the office phone and fax number?    Phone: [phone]  Fax: [phone]   ATTENTION: Unable to create telephone message as per your standard workflow. Directed by [facility] providers to send requests for cardiac clearance to this pool for appropriate distribution to provider covering pre-operative clearances.   Honor Loh, MSN, APRN, FNP-C, CEN  [facility]  Peri-operative Services Nurse Practitioner  Phone: [phone]  12/15/21 3:13 PM

## 2021-12-15 NOTE — Patient Instructions (Signed)
Your procedure is scheduled on:12-22-21 Monday Report to the Registration Desk on the 1st floor of the [facility].Then proceed to the 2nd floor Surgery Desk To find out your arrival time, please call [phone] between 1PM - 3PM on:12-19-21 Friday If your arrival time is 6:00 am, do not arrive prior to that time as the [facility] entrance doors do not open until 6:00 am.  REMEMBER: Instructions that are not followed completely may result in serious medical risk, up to and including death; or upon the discretion of your surgeon and anesthesiologist your surgery may need to be rescheduled.  Do not eat food OR drink any liquids after midnight the night before surgery.  No gum chewing, lozengers or hard candies.  Do NOT take any medication the day of surgery  Continue your 81 mg Aspirin per Dr Erlene Quan up until the day prior to your surgery  One week prior to surgery: Stop Anti-inflammatories (NSAIDS) such as Advil, Aleve, Ibuprofen, Motrin, Naproxen, Naprosyn and Aspirin based products such as Excedrin, Goodys Powder, BC Powder.You may however, take Tylenol if needed for pain up until the day of surgery.  Stop ANY OVER THE COUNTER supplements/vitamins NOW (12-15-21) until after surgery (Multivitamin)  No Alcohol for 24 hours before or after surgery.  No Smoking including e-cigarettes for 24 hours prior to surgery.  No chewable tobacco products for at least 6 hours prior to surgery.  No nicotine patches on the day of surgery.  Do not use any "recreational" drugs for at least a week prior to your surgery.  Please be advised that the combination of cocaine and anesthesia may have negative outcomes, up to and including death. If you test positive for cocaine, your surgery will be cancelled.  On the morning of surgery brush your teeth with toothpaste and water, you may rinse your mouth with mouthwash if you wish. Do not swallow any toothpaste or mouthwash.  Do not wear jewelry, make-up,  hairpins, clips or nail polish.  Do not wear lotions, powders, or perfumes.   Do not shave body from the neck down 48 hours prior to surgery just in case you cut yourself which could leave a site for infection.  Also, freshly shaved skin may become irritated if using the CHG soap.  Contact lenses, hearing aids and dentures may not be worn into surgery.  Do not bring valuables to the hospital. [facility] is not responsible for any missing/lost belongings or valuables.   Notify your doctor if there is any change in your medical condition (cold, fever, infection).  Wear comfortable clothing (specific to your surgery type) to the hospital.  After surgery, you can help prevent lung complications by doing breathing exercises.  Take deep breaths and cough every 1-2 hours. Your doctor may order a device called an Incentive Spirometer to help you take deep breaths. When coughing or sneezing, hold a pillow firmly against your incision with both hands. This is called "splinting." Doing this helps protect your incision. It also decreases belly discomfort.  If you are being admitted to the hospital overnight, leave your suitcase in the car. After surgery it may be brought to your room.  If you are being discharged the day of surgery, you will not be allowed to drive home. You will need a responsible adult (18 years or older) to drive you home and stay with you that night.   If you are taking public transportation, you will need to have a responsible adult (18 years or older)  with you. Please confirm with your physician that it is acceptable to use public transportation.   Please call the [facility] Dept. at [phone] if you have any questions about these instructions.  Surgery Visitation Policy:  Patients undergoing a surgery or procedure may have two family members or support persons with them as long as the person is not COVID-19 positive or experiencing its symptoms.

## 2021-12-15 NOTE — Telephone Encounter (Signed)
-----   Message from Karen Kitchens, NP sent at 12/15/2021  3:13 PM EDT ----- Regarding: Request for pre-operative cardiac clearance Request for pre-operative cardiac clearance:  1. What type of surgery is being performed?  CYSTOSCOPY WITH INSERTION OF UROLIFT  2. When is this surgery scheduled?  12/22/2021  3. Type of clearance being requested (medical, pharmacy, both). MEDICAL   4. Are there any medications that need to be held prior to surgery? NONE  5. Practice name and name of physician performing surgery?  Performing surgeon: Dr. Hollice Espy, MD Requesting clearance: Honor Loh, FNP-C    6. Anesthesia type (none, local, MAC, general)? MAC  7. What is the office phone and fax number?   Phone: [phone] Fax: [phone]  ATTENTION: Unable to create telephone message as per your standard workflow. Directed by [facility] providers to send requests for cardiac clearance to this pool for appropriate distribution to provider covering pre-operative clearances.   Honor Loh, MSN, APRN, FNP-C, CEN [facility]  Peri-operative Services Nurse Practitioner Phone: [phone] 12/15/21 3:13 PM

## 2021-12-16 ENCOUNTER — Encounter
Admission: RE | Admit: 2021-12-16 | Discharge: 2021-12-16 | Disposition: A | Discharge status: Home / Self Care | Payer: Medicare HMO | Source: Ambulatory Visit | Attending: Urology | Admitting: Urology

## 2021-12-16 DIAGNOSIS — I2510 Atherosclerotic heart disease of native coronary artery without angina pectoris: Secondary | ICD-10-CM | POA: Diagnosis not present

## 2021-12-16 DIAGNOSIS — Z01818 Encounter for other preprocedural examination: Secondary | ICD-10-CM | POA: Diagnosis not present

## 2021-12-16 DIAGNOSIS — N1831 Chronic kidney disease, stage 3a: Secondary | ICD-10-CM | POA: Diagnosis not present

## 2021-12-16 DIAGNOSIS — Z01810 Encounter for preprocedural cardiovascular examination: Secondary | ICD-10-CM | POA: Diagnosis not present

## 2021-12-16 DIAGNOSIS — J449 Chronic obstructive pulmonary disease, unspecified: Secondary | ICD-10-CM

## 2021-12-16 DIAGNOSIS — I359 Nonrheumatic aortic valve disorder, unspecified: Secondary | ICD-10-CM

## 2021-12-16 LAB — BASIC METABOLIC PANEL
Anion gap: 6 (ref 5–15)
BUN: 18 mg/dL (ref 8–23)
CO2: 25 mmol/L (ref 22–32)
Calcium: 8.4 mg/dL — ABNORMAL LOW (ref 8.9–10.3)
Chloride: 108 mmol/L (ref 98–111)
Creatinine, Ser: 1.37 mg/dL — ABNORMAL HIGH (ref 0.61–1.24)
GFR, Estimated: 52 mL/min — ABNORMAL LOW (ref >60)
Glucose, Bld: 104 mg/dL — ABNORMAL HIGH (ref 70–99)
Potassium: 4.1 mmol/L (ref 3.5–5.1)
Sodium: 139 mmol/L (ref 135–145)

## 2021-12-16 LAB — CBC
HCT: 39.6 % (ref 39.0–52.0)
Hemoglobin: 12.5 g/dL — ABNORMAL LOW (ref 13.0–17.0)
MCH: 30.8 pg (ref 26.0–34.0)
MCHC: 31.6 g/dL (ref 30.0–36.0)
MCV: 97.5 fL (ref 80.0–100.0)
Platelets: 254 10*3/uL (ref 150–400)
RBC: 4.06 MIL/uL — ABNORMAL LOW (ref 4.22–5.81)
RDW: 12.9 % (ref 11.5–15.5)
WBC: 7 10*3/uL (ref 4.0–10.5)
nRBC: 0 % (ref 0.0–0.2)

## 2021-12-17 ENCOUNTER — Encounter: Payer: Self-pay | Admitting: Urology

## 2021-12-17 NOTE — Progress Notes (Signed)
Perioperative Services  Pre-Admission/Anesthesia Testing Clinical Review  Date: 12/19/21  Patient Demographics:  Name: Garrett Moore DOB:   Dec 29, 1940 MRN:   [id number]  Planned Surgical Procedure(s):    Case: [id number] Date/Time: 12/22/21 1128   Procedure: CYSTOSCOPY WITH INSERTION OF UROLIFT   Anesthesia type: Monitor Anesthesia Care   Pre-op diagnosis: Benign Prostatic Hyperplasia with Bladder Outlet Obstruction   Location: [facility] OR ROOM 10 / [facility]   Surgeons: Hollice Espy, MD   NOTE: Available PAT nursing documentation and vital signs have been reviewed. Clinical nursing staff has updated patient's PMH/PSHx, current medication list, and drug allergies/intolerances to ensure comprehensive history available to assist in medical decision making as it pertains to the aforementioned surgical procedure and anticipated anesthetic course. Extensive review of available clinical information performed. [place] PMH and PSHx updated with any diagnoses/procedures that  may have been inadvertently omitted during his intake with the pre-admission testing department's nursing staff.  Clinical Discussion:  Garrett Moore is a 81 y.o. male who is submitted for pre-surgical anesthesia review and clearance prior to him undergoing the above procedure. Patient is a Former Smoker (100 pack years; quit 03/2002). Pertinent PMH includes: CAD, MI, AAA (s/p EVAR), aortic stenosis, PVD, BILATERAL carotid artery stenosis, BILATERAL iliac artery aneurysm (s/p BILATERAL stenting), aortic atherosclerosis, cardiac murmur, diastolic dysfunction, paroxysmal atrial fibrillation (?? setting of MI), HLD, DVT as child, COPD, CKD-III, GERD (no daily Tx), BPH, glaucoma, OA.  Patient is followed by cardiology [name], MD). He was last seen in the cardiology clinic on 05/30/2021; notes reviewed.  At the time of his clinic visit, patient reported to be doing well overall from a cardiovascular  perspective. At the time of his clinic visit, the patient denied any chest pain, shortness of breath, PND, orthopnea, palpitations, significant peripheral edema, vertiginous symptoms, or presyncope/syncope.  Patient with a PMH significant for cardiovascular diagnoses.  TTE performed on 07/21/2012 revealed a low normal left ventricular systolic function with an EF of 50-55%.  There was mild aortic valve stenosis noted; AVA (VTI) = 1.67 cm.  Mean pressure gradient = 9 mmHg.    Patient suffered and MRI on 08/13/2016.  Diagnostic LEFT heart catheterization revealed a complete total occlusion of the RCA.  PTCA was performed to the RCA reducing the occlusion down to 80%.  During his procedure, patient became nauseated and developed an idioventricular rhythm with further decompensation to AV disassociation.  Systolic blood pressure dropped into the 80s.  Transvenous pacemaker was floated, which resulted and stabilization of the patient.  Following stabilization, a second pass was performed (PTCA repeated), which further reduced the degree of stenosis to 30%.  Procedure was concluded and patient was transferred to the ICU where he remained overnight for observation.  Patient found to have aneurysmal dilatation of the ascending aorta on TTE performed on 02/08/2018.  At that time aortic root measured 38 mm and ascending aorta measured 40 mm.   CT hematuria work-up performed on 05/10/2020 demonstrated an infrarenal AAA measuring 5.9 cm.  Patient subsequently underwent endovascular repair (EVAR) on 07/03/2020.  Most recent imaging CT imaging of the abdomen pelvis was performed on 10/14/2021, which revealed a residual aneurysmal sac measuring 6.0 cm; favored to be stable.  Most recent TTE was performed on 06/14/2020 revealing a normal left ventricular systolic function with an EF of 60 to 65%. Left ventricular diastolic Doppler parameters consistent with abnormal relaxation (G1DD). Degree of aortic valve stenosis had  progressed to moderate; AVA (VTI) =  1.58 cm.  Mean pressure gradient = 26.8 mmHg.   Patient found to have BILATERAL iliac artery aneurysms, which were ultimately stented on 07/03/2020.  Blood pressure well controlled at 120/60 mmHg without the use of pharmacological intervention.  Patient is on simvastatin for his HLD diagnosis and ASCVD prevention.  He is not diabetic. Patient does not have an OSAH diagnosis.  ECG in the office revealed normal sinus rhythm at a rate of 85 bpm with no evidence of acute ST or T wave abnormalities.  Functional capacity limited by age and medical comorbidities, however patient still felt to be able to achieve at least 4 METS of activity without experiencing any type of angina/anginal equivalent symptoms.  No changes were made to his medication regimen.  Patient to follow-up with outpatient cardiology in 1 year or sooner if needed.  Garrett Moore is scheduled for an CYSTOSCOPY WITH INSERTION OF UROLIFT on 12/22/2021 with Dr. Karren Cobble, MD.  Given patient's past medical history significant for cardiovascular diagnoses, presurgical cardiac clearance was sought by the PAT team. Per cardiology, "according to the RCRI, his perioperative risk of MACE is 0.9%. His functional capacity in METs is 5.81 according to the DASI. Therefore, based on ACC/AHA guidelines, patient would be at an ACCEPTABLE risk for the planned procedure without further cardiovascular testing". In review of his medication reconciliation, it is noted the patient is on daily antiplatelet therapy.  Per recommendations from his primary attending surgeon, patient may continue his daily low-dose ASA throughout his perioperative course.  Patient denies previous perioperative complications with anesthesia in the past. In review of the available records, it is noted that patient underwent a general anesthetic course here at [facility] ([facility] in 09/2021 without documented  complications.      12/08/2021   10:52 AM 11/05/2021    2:14 PM 10/31/2021   10:53 AM  Vitals with BMI  Height  _0    Weight 179 lbs 13 oz 180 lbs 180 lbs  BMI  48.25 00.37  Systolic 048 889 169  Diastolic 63 78 64  Pulse 80 75 68    Providers/Specialists:   NOTE: Primary physician provider listed below. Patient may have been seen by APP or partner within same practice.   PROVIDER ROLE / SPECIALTY LAST Lu Duffel, MD Urology (Surgeon) 11/05/2021  [name], MD Primary Care Provider ???  Ida Rogue, MD Cardiology 05/30/2021  [name], MD Vascular Surgery 12/08/2021   Allergies:  Quinolones and Sulfa antibiotics  Current Home Medications:    aspirin EC 81 MG tablet   Multiple Vitamin (MULTIVITAMIN WITH MINERALS) TABS tablet   nicotine polacrilex (COMMIT) 2 MG lozenge   simvastatin (ZOCOR) 80 MG tablet   History:   Past Medical History:  Diagnosis Date   Aneurysm of infrarenal abdominal aorta ([place]) 02/08/2018   a.) TTE 02/08/2018: Ao root 38 mm, asc Ao 40 mm; b.) TTE 06/14/2020; asc Ao 28 mm; c.) CT hematuria 05/10/2020: infrarenal AAA measuring 5.9 cm; d.) s/p EVAR 07/03/2020; e.) CT A/P 10/14/2021: residual aneurysmal sac (s/p EVAR) measured 6.0 cm   Aortic atherosclerosis (HCC)    Aortic valvar stenosis 07/21/2012   a.) TTE 07/21/2012: EF 50-55%, mild AS (AVA = 1.67, MPG 9); b.) TTE 02/08/2018: EF 60-65%; mod AS (MPG 17); c.) TTE 06/14/2020: EF 60-65%, mod AS (MPG 26.8)   Atherosclerosis of native arteries of extremity with intermittent claudication (HCC)    Basal cell carcinoma, face  BPH (benign prostatic hyperplasia)    Carotid artery stenosis 05/21/2006   a.) carotid US 05/21/2006: 0-49% BICA; b.) carotid US 06/10/2020: 1-39% RICA, total occ LICA   CKD (chronic kidney disease), stage III (HCC)    COPD (chronic obstructive pulmonary disease) ([place])    Coronary artery disease 08/14/1983   a.) MI --> LHC 08/13/2016: CTO of the RCA  --> PTCA performed resulting in 30% residual stenosis; aberrant takeoff of his RCA coming off somewhat high and posteriorly.   Diastolic dysfunction 21/19/4174   a.) TTE 02/08/2018: EF 60-65%, mild MAC,  triv TR, mild-mod AS, G1DD; b.) TTE 06/14/2020: EF 60-65%, mod AS, triv TR, mild AR, G1DD   Diverticulosis    Family history of adverse reaction to anesthesia    a.) extended GA course for mother --> postoperaitve memory problems/dementia   GERD (gastroesophageal reflux disease)    Glaucoma    Heart murmur    History of atrial fibrillation    a.) suspect in setting of admission for MI; recent ECGs reveal NSR   History of kidney stones    Hyperlipidemia    Iliac artery aneurysm, bilateral ([place]) 07/03/2020   a.) s/p insertion of BILATERAL iliac artery stents   Myocardial infarction ([place]) 08/13/2016   a.) MI --> LHC 08/13/2016: CTO of the RCA --> PTCA performed resulting in 30% residual stenosis. Procedure complicated by procedural nausea, Patient went into IVR and then AV disaccociated rhythm. SBP dropped (80s). TVP floated with capture, which stabilized patient. ICU admission overnight.   OA (osteoarthritis)    Peripheral vascular disease (HCC)    Postoperative deep vein thrombosis (DVT) ([place])    a.) postop age 27 following tonsillectomy (per patient report)   Past Surgical History:  Procedure Laterality Date   CATARACT EXTRACTION, BILATERAL  01/2019   CORONARY ANGIOPLASTY  08/14/1983   Procedure: CORONARY ANGIOPLASTY (PTCA pRCA); Location: [facility]; Surgeon: Al Little, MD   CYSTOSCOPY W/ RETROGRADES  08/12/2020   Procedure: CYSTOSCOPY WITH RETROGRADE PYELOGRAM;  Surgeon: Hollice Espy, MD;  Location: [facility];  Service: Urology;;   CYSTOSCOPY/URETEROSCOPY/HOLMIUM LASER/STENT PLACEMENT Left 08/12/2020   Procedure: CYSTOSCOPY/URETEROSCOPY/HOLMIUM LASER/STENT PLACEMENT;  Surgeon: Hollice Espy, MD;  Location: [facility];  Service: Urology;  Laterality: Left;   ENDOVASCULAR REPAIR/STENT  GRAFT N/A 07/03/2020   Procedure: ENDOVASCULAR REPAIR/STENT GRAFT;  Surgeon: Katha Cabal, MD;  Location: [facility];  Service: Cardiovascular;  Laterality: N/A;   PELVIC ANGIOGRAPHY Right 10/14/2021   Procedure: PELVIC ANGIOGRAPHY;  Surgeon: Katha Cabal, MD;  Location: [facility];  Service: Cardiovascular;  Laterality: Right;   PERIPHERAL VASCULAR THROMBECTOMY Right 10/14/2021   Procedure: PERIPHERAL VASCULAR THROMBECTOMY;  Surgeon: Katha Cabal, MD;  Location: [facility];  Service: Cardiovascular;  Laterality: Right;   ROTATOR CUFF REPAIR Left    TEMPORARY PACEMAKER  08/14/1983   Procedure: TEMPORARY PACEMAKER PLACEMENT; Location: [facility]; Surgeon: Al Little, MD   TONSILLECTOMY     TOTAL HIP ARTHROPLASTY Left 02/21/2019   Procedure: TOTAL HIP ARTHROPLASTY ANTERIOR APPROACH;  Surgeon: Paralee Cancel, MD;  Location: [facility];  Service: Orthopedics;  Laterality: Left;  70 mins   VITRECTOMY Bilateral    Family History  Problem Relation Age of Onset   Heart Problems Mother    AAA (abdominal aortic aneurysm) Mother    Social History   Tobacco Use   Smoking status: Former    Packs/day: 2.00    Years: 50.00    Total pack years: 100.00  Types: Cigarettes    Quit date: 2004    Years since quitting: 19.7   Smokeless tobacco: Never   Tobacco comments:    quit 17 years ago  Vaping Use   Vaping Use: Never used  Substance Use Topics   Alcohol use: Not Currently   Drug use: Never    Pertinent Clinical Results:  LABS: Labs reviewed: Acceptable for surgery.  No visits with results within 3 Day(s) from this visit.  Latest known visit with results is:  Hospital Outpatient Visit on 12/16/2021  Component Date Value Ref Range Status   Sodium 12/16/2021 139  135 - 145 mmol/L Final   Potassium 12/16/2021 4.1  3.5 - 5.1 mmol/L Final   Chloride 12/16/2021 108  98 - 111 mmol/L Final   CO2 12/16/2021 25  22 - 32 mmol/L Final   Glucose, Bld  12/16/2021 104 (H)  70 - 99 mg/dL Final   Glucose reference range applies only to samples taken after fasting for at least 8 hours.   BUN 12/16/2021 18  8 - 23 mg/dL Final   Creatinine, Ser 12/16/2021 1.37 (H)  0.61 - 1.24 mg/dL Final   Calcium 12/16/2021 8.4 (L)  8.9 - 10.3 mg/dL Final   GFR, Estimated 12/16/2021 52 (L)  >60 mL/min Final   Comment: (NOTE) Calculated using the CKD-EPI Creatinine Equation (2021)    Anion gap 12/16/2021 6  5 - 15 Final   Performed at [facility], [address] [place], [place] [address]   WBC 12/16/2021 7.0  4.0 - 10.5 K/uL Final   RBC 12/16/2021 4.06 (L)  4.22 - 5.81 MIL/uL Final   Hemoglobin 12/16/2021 12.5 (L)  13.0 - 17.0 g/dL Final   HCT 12/16/2021 39.6  39.0 - 52.0 % Final   MCV 12/16/2021 97.5  80.0 - 100.0 fL Final   MCH 12/16/2021 30.8  26.0 - 34.0 pg Final   MCHC 12/16/2021 31.6  30.0 - 36.0 g/dL Final   RDW 12/16/2021 12.9  11.5 - 15.5 % Final   Platelets 12/16/2021 254  150 - 400 K/uL Final   nRBC 12/16/2021 0.0  0.0 - 0.2 % Final   Performed at [facility], [address] [place], [place] [address]    ECG: Date: 12/16/2021 Time ECG obtained: 0816 AM Rate: 62 bpm Rhythm: normal sinus Axis (leads I and aVF): Normal Intervals: PR 200 ms. QRS 86 ms. QTc 426 ms. ST segment and T wave changes: No evidence of acute ST segment elevation or depression Comparison: Similar to previous tracing obtained on 05/30/2021   IMAGING / PROCEDURES: VAS [place] ABI WITH/WO TBI performed on 12/08/2021 Right: Resting right ankle-brachial index indicates moderate right lower extremity arterial disease. The right toe-brachial index is abnormal.  Left: Resting left ankle-brachial index is within normal range. The left toe-brachial index is normal.   CT ANGIO ABD/PEL W/ AND/OR W/O performed on 10/14/2021 Infrarenal abdominal aortic aneurysm status post endograft repair. Maximum diameter of the aneurysm sac measures 6.0 cm which is not  significantly changed when compared to examination from 04/30/2020. (The patient is currently under the care of a vascular specialist/surgeon). Unchanged right common iliac artery aneurysm measuring up to 4.9 cm. Unchanged left common iliac artery aneurysm measuring up to 3.2 cm. Occluded right external and common iliac arteries with reconstitution of flow in the right common femoral artery. High-grade stenosis at the origin of the right profundus femoris artery. Colonic diverticulosis. Moderately enlarged prostate.  VAS [place] EVAR DUPLEX performed on  07/28/2021 There is evidence of abnormal dilatation of the mid and distal abdominal aorta.  There is evidence of abnormal dilation of the RIGHT common iliac artery and LEFT common iliac artery.  Patent endovascular aneurysm repair with no evidence of  endoleak. No endo leak seen in AAA or bilateral CIA's.  The largest aortic diameter remains essentially unchanged compared to prior exam. Previous diameter measurement was 5.0 cm.   TRANSTHORACIC ECHOCARDIOGRAM performed on 06/14/2020 Left ventricular ejection fraction, by estimation, is 60 to 65%. The left ventricle has normal function. The left ventricle has no regional wall motion abnormalities. Left ventricular diastolic parameters are consistent with Grade I diastolic dysfunction (impaired relaxation).  Right ventricular systolic function is normal. The right ventricular size is normal. Tricuspid regurgitation signal is inadequate for assessing  PA pressure.  The mitral valve is normal in structure. Trivial mitral valve regurgitation. No evidence of mitral stenosis.  The aortic valve has an indeterminant number of cusps. Aortic valve regurgitation is mild. Moderate aortic valve stenosis.  Aortic dilatation noted. There is borderline dilatation of the ascending aorta, measuring 38 mm.  The inferior vena cava is normal in size with greater than 50% respiratory variability, suggesting right atrial  pressure of 3 mmHg.  BILATERAL CAROTID DOPPLER performed on 06/10/2020 Right Carotid:  Velocities in the right ICA are consistent with a 1-39% stenosis. Non-hemodynamically significant plaque <50% noted in the CCA.  Left Carotid:  Evidence consistent with a total occlusion of the left ICA. Non-hemodynamically significant plaque <50% noted in the CCA.  Vertebrals:   Bilateral vertebral arteries demonstrate antegrade flow.  Subclavians:  Normal flow hemodynamics were seen in bilateral subclavian arteries.    MYOCARDIAL PERFUSION IMAGING STUDY (LEXISCAN) performed on 01/23/2011 Perfusion defect in the inferior myocardial region is consistent with diaphragmatic attenuation.   The remaining myocardium demonstrates normal myocardial perfusion with no evidence of ischemia or infarct.   Post-rest left ventricle is normal in size The post-rest ejection fraction is 65%.   Global left ventricular systolic function is normal.   Exercise capacity 10 METS. This is a low risk study   LEFT HEART CATHETERIZATION AND CORONARY ANGIOGRAPHY performed on 08/14/1983 LAD was a small vessel without intrinsic lesions LCx was a codominant vessel with a very large hybrid marginal system RCA with CTO at the proximal third.  This was easily transversed with a guidewire and dilatation was performed leaving a residual 30% stenosis.  It should be pointed out that the patient had an aberrant takeoff of his RCA coming off somewhat high and posteriorly  Impression and Plan:  Garrett Moore has been referred for pre-anesthesia review and clearance prior to him undergoing the planned anesthetic and procedural courses. Available labs, pertinent testing, and imaging results were personally reviewed by me. This patient has been appropriately cleared by cardiology with an overall ACCEPTABLE risk of significant perioperative cardiovascular complications.  Based on clinical review performed today (12/19/21), barring any  significant acute changes in the patient's overall condition, it is anticipated that he will be able to proceed with the planned surgical intervention. Any acute changes in clinical condition may necessitate his procedure being postponed and/or cancelled. Patient will meet with anesthesia team (MD and/or CRNA) on the day of his procedure for preoperative evaluation/assessment. Questions regarding anesthetic course will be fielded at that time.   Pre-surgical instructions were reviewed with the patient during his PAT appointment and questions were fielded by PAT clinical staff. Patient was advised that if any questions or concerns arise  prior to his procedure then he should return a call to PAT and/or his surgeon's office to discuss.  Honor Loh, MSN, APRN, FNP-C, CEN [facility]  Peri-operative Services Nurse Practitioner Phone: [phone] Fax: [phone] 12/19/21 7:15 AM  NOTE: This note has been prepared using Dragon dictation software. Despite my best ability to proofread, there is always the potential that unintentional transcriptional errors may still occur from this process.

## 2021-12-18 ENCOUNTER — Ambulatory Visit: Payer: Medicare HMO | Attending: Cardiovascular Disease | Admitting: Nurse Practitioner

## 2021-12-18 DIAGNOSIS — Z01810 Encounter for preprocedural cardiovascular examination: Secondary | ICD-10-CM

## 2021-12-18 NOTE — Progress Notes (Signed)
Virtual Visit via Telephone Note   Because of Garrett Moore's co-morbid illnesses, he is at least at moderate risk for complications without adequate follow up.  This format is felt to be most appropriate for this patient at this time.  The patient did not have access to video technology/had technical difficulties with video requiring transitioning to audio format only (telephone).  All issues noted in this document were discussed and addressed.  No physical exam could be performed with this format.  Please refer to the patient's chart for his consent to telehealth for [facility].  Evaluation Performed:  Preoperative cardiovascular risk assessment _____________   Date:  12/18/2021   Patient ID:  Garrett Moore, DOB 01/22/1941, MRN [id number] Patient Location:  Home Provider location:   Office  Primary Care Provider:  [name], MD Primary Cardiologist:  Ida Rogue, MD  Chief Complaint / Patient Profile   81 y.o. y/o male with a h/o CAD s/p PTCA-RCA in 1985, aortic valve stenosis, dilation of ascending aorta, bilateral carotid artery stenosis, PAD, and hyperlipidemia who is pending cystoscopy with insertion of UroLift on 12/22/2021 with Dr. Hollice Espy and presents today for telephonic preoperative cardiovascular risk assessment.  Past Medical History    Past Medical History:  Diagnosis Date   Aneurysm of infrarenal abdominal aorta ([place]) 02/08/2018   a.) TTE 02/08/2018: Ao root 38 mm, asc Ao 40 mm; b.) TTE 06/14/2020; asc Ao 28 mm; c.) CT hematuria 05/10/2020: infrarenal AAA measuring 5.9 cm; d.) s/p EVAR 07/03/2020; e.) CT A/P 10/14/2021: residual aneurysmal sac (s/p EVAR) measured 6.0 cm   Aortic atherosclerosis (HCC)    Aortic valvar stenosis 07/21/2012   a.) TTE 07/21/2012: EF 50-55%, mild AS (AVA = 1.67, MPG 9); b.) TTE 02/08/2018: EF 60-65%; mod AS (MPG 17); c.) TTE 06/14/2020: EF 60-65%, mod AS (MPG 26.8)   Atherosclerosis of native arteries of  extremity with intermittent claudication (HCC)    Basal cell carcinoma, face    BPH (benign prostatic hyperplasia)    Carotid artery stenosis 05/21/2006   a.) carotid US 05/21/2006: 0-49% BICA; b.) carotid US 06/10/2020: 1-39% RICA, total occ LICA   CKD (chronic kidney disease), stage III (HCC)    COPD (chronic obstructive pulmonary disease) ([place])    Coronary artery disease 08/14/1983   a.) MI --> LHC 08/13/2016: CTO of the RCA --> PTCA performed resulting in 30% residual stenosis; aberrant takeoff of his RCA coming off somewhat high and posteriorly.   Diastolic dysfunction 25/85/2778   a.) TTE 02/08/2018: EF 60-65%, mild MAC,  triv TR, mild-mod AS, G1DD; b.) TTE 06/14/2020: EF 60-65%, mod AS, triv TR, mild AR, G1DD   Diverticulosis    Family history of adverse reaction to anesthesia    a.) extended GA course for mother --> postoperaitve memory problems/dementia   GERD (gastroesophageal reflux disease)    Glaucoma    Heart murmur    History of atrial fibrillation    a.) suspect in setting of admission for MI; recent ECGs reveal NSR   History of kidney stones    Hyperlipidemia    Iliac artery aneurysm, bilateral ([place]) 07/03/2020   a.) s/p insertion of BILATERAL iliac artery stents   Myocardial infarction ([place]) 08/13/2016   a.) MI --> LHC 08/13/2016: CTO of the RCA --> PTCA performed resulting in 30% residual stenosis. Procedure complicated by procedural nausea, Patient went into IVR and then AV disaccociated rhythm. SBP dropped (80s). TVP floated with capture, which stabilized patient. ICU admission  overnight.   OA (osteoarthritis)    Peripheral vascular disease (HCC)    Postoperative deep vein thrombosis (DVT) ([place])    a.) postop age 11 following tonsillectomy (per patient report)   Past Surgical History:  Procedure Laterality Date   CATARACT EXTRACTION, BILATERAL  01/2019   CORONARY ANGIOPLASTY  08/14/1983   Procedure: CORONARY ANGIOPLASTY (PTCA pRCA); Location: [facility]; Surgeon:  Al Little, MD   CYSTOSCOPY W/ RETROGRADES  08/12/2020   Procedure: CYSTOSCOPY WITH RETROGRADE PYELOGRAM;  Surgeon: Hollice Espy, MD;  Location: [facility];  Service: Urology;;   CYSTOSCOPY/URETEROSCOPY/HOLMIUM LASER/STENT PLACEMENT Left 08/12/2020   Procedure: CYSTOSCOPY/URETEROSCOPY/HOLMIUM LASER/STENT PLACEMENT;  Surgeon: Hollice Espy, MD;  Location: [facility];  Service: Urology;  Laterality: Left;   ENDOVASCULAR REPAIR/STENT GRAFT N/A 07/03/2020   Procedure: ENDOVASCULAR REPAIR/STENT GRAFT;  Surgeon: Katha Cabal, MD;  Location: [facility];  Service: Cardiovascular;  Laterality: N/A;   PELVIC ANGIOGRAPHY Right 10/14/2021   Procedure: PELVIC ANGIOGRAPHY;  Surgeon: Katha Cabal, MD;  Location: [facility];  Service: Cardiovascular;  Laterality: Right;   PERIPHERAL VASCULAR THROMBECTOMY Right 10/14/2021   Procedure: PERIPHERAL VASCULAR THROMBECTOMY;  Surgeon: Katha Cabal, MD;  Location: [facility];  Service: Cardiovascular;  Laterality: Right;   ROTATOR CUFF REPAIR Left    TEMPORARY PACEMAKER  08/14/1983   Procedure: TEMPORARY PACEMAKER PLACEMENT; Location: [facility]; Surgeon: Al Little, MD   TONSILLECTOMY     TOTAL HIP ARTHROPLASTY Left 02/21/2019   Procedure: TOTAL HIP ARTHROPLASTY ANTERIOR APPROACH;  Surgeon: Paralee Cancel, MD;  Location: [facility];  Service: Orthopedics;  Laterality: Left;  70 mins   VITRECTOMY Bilateral     Allergies  Allergies  Allergen Reactions   Quinolones Other (See Comments)    Other reaction(s): Has aortic root dilatation   Sulfa Antibiotics Rash    History of Present Illness    Garrett Moore is a 81 y.o. male who presents via audio/video conferencing for a telehealth visit today.  Pt was last seen in cardiology clinic on 05/30/2021 by Dr. [name].  At that time Garrett Moore was doing well.  The patient is now pending procedure as outlined above. Since his last visit, he has done well from a cardiac  standpoint. He walks 5 miles a day. He denies chest pain, palpitations, dyspnea, pnd, orthopnea, n, v, dizziness, syncope, edema, weight gain, or early satiety. All other systems reviewed and are otherwise negative except as noted above.   Home Medications    Prior to Admission medications   Medication Sig Start Date End Date Taking? Authorizing Provider  aspirin EC 81 MG tablet Take 81 mg by mouth at bedtime. Swallow whole.    [provider]  Multiple Vitamin (MULTIVITAMIN WITH MINERALS) TABS tablet Take 1 tablet by mouth daily.    [provider]  nicotine polacrilex (COMMIT) 2 MG lozenge Take 2 mg by mouth as needed for smoking cessation.    [provider]  simvastatin (ZOCOR) 80 MG tablet Take 80 mg by mouth at bedtime.    [provider]    Physical Exam    Vital Signs:  Garrett Moore does not have vital signs available for review today.  Given telephonic nature of communication, physical exam is limited. AAOx3. NAD. Normal affect.  Speech and respirations are unlabored.  Accessory Clinical Findings    None  Assessment & Plan    1.  Preoperative Cardiovascular Risk Assessment:  According to the Revised Cardiac Risk Index (  RCRI), his Perioperative Risk of Major Cardiac Event is (%): 0.9. His Functional Capacity in METs is: 5.81 according to the Duke Activity Status Index (DASI). Therefore, based on ACC/AHA guidelines, patient would be at acceptable risk for the planned procedure without further cardiovascular testing.  The patient was advised that if he develops new symptoms prior to surgery to contact our office to arrange for a follow-up visit, and he verbalized understanding.   A copy of this note will be routed to requesting surgeon.  Time:   Today, I have spent 5 minutes with the patient with telehealth technology discussing medical history, symptoms, and management plan.     Lenna Sciara, NP  12/18/2021, 2:28 PM

## 2021-12-21 MED ORDER — FAMOTIDINE 20 MG PO TABS: 20.0000 mg | ORAL_TABLET | Freq: Once | ORAL | Status: AC

## 2021-12-21 MED ORDER — CEFAZOLIN SODIUM-DEXTROSE 2-4 GM/100ML-% IV SOLN
2.0000 g | INTRAVENOUS | Status: AC
  Administered 2021-12-22: 2 g via INTRAVENOUS

## 2021-12-21 MED ORDER — LACTATED RINGERS IV SOLN: INTRAVENOUS | Status: DC

## 2021-12-21 MED ORDER — ORAL CARE MOUTH RINSE: 15.0000 mL | Freq: Once | OROMUCOSAL | Status: AC

## 2021-12-21 MED ORDER — CHLORHEXIDINE GLUCONATE 0.12 % MT SOLN: 15.0000 mL | Freq: Once | OROMUCOSAL | Status: AC

## 2021-12-22 ENCOUNTER — Encounter
Admission: RE | Disposition: A | Discharge status: Home / Self Care | Payer: Self-pay | Source: Home / Self Care | Attending: Urology

## 2021-12-22 ENCOUNTER — Encounter: Payer: Self-pay | Admitting: Urology

## 2021-12-22 ENCOUNTER — Other Ambulatory Visit: Payer: Self-pay

## 2021-12-22 ENCOUNTER — Ambulatory Visit
Admission: RE | Admit: 2021-12-22 | Discharge: 2021-12-22 | Disposition: A | Discharge status: Home / Self Care | Payer: Medicare HMO | Attending: Urology | Admitting: Urology

## 2021-12-22 ENCOUNTER — Ambulatory Visit: Payer: Medicare HMO | Admitting: Urgent Care

## 2021-12-22 DIAGNOSIS — I252 Old myocardial infarction: Secondary | ICD-10-CM | POA: Diagnosis not present

## 2021-12-22 DIAGNOSIS — N183 Chronic kidney disease, stage 3 unspecified: Secondary | ICD-10-CM | POA: Insufficient documentation

## 2021-12-22 DIAGNOSIS — I350 Nonrheumatic aortic (valve) stenosis: Secondary | ICD-10-CM | POA: Insufficient documentation

## 2021-12-22 DIAGNOSIS — Z87891 Personal history of nicotine dependence: Secondary | ICD-10-CM | POA: Insufficient documentation

## 2021-12-22 DIAGNOSIS — N401 Enlarged prostate with lower urinary tract symptoms: Secondary | ICD-10-CM | POA: Insufficient documentation

## 2021-12-22 DIAGNOSIS — Z79899 Other long term (current) drug therapy: Secondary | ICD-10-CM | POA: Insufficient documentation

## 2021-12-22 DIAGNOSIS — N138 Other obstructive and reflux uropathy: Secondary | ICD-10-CM | POA: Insufficient documentation

## 2021-12-22 DIAGNOSIS — J449 Chronic obstructive pulmonary disease, unspecified: Secondary | ICD-10-CM | POA: Diagnosis not present

## 2021-12-22 DIAGNOSIS — Z86718 Personal history of other venous thrombosis and embolism: Secondary | ICD-10-CM | POA: Insufficient documentation

## 2021-12-22 DIAGNOSIS — I739 Peripheral vascular disease, unspecified: Secondary | ICD-10-CM | POA: Diagnosis not present

## 2021-12-22 DIAGNOSIS — I2510 Atherosclerotic heart disease of native coronary artery without angina pectoris: Secondary | ICD-10-CM | POA: Insufficient documentation

## 2021-12-22 DIAGNOSIS — N35912 Unspecified bulbous urethral stricture, male: Secondary | ICD-10-CM | POA: Diagnosis not present

## 2021-12-22 DIAGNOSIS — E785 Hyperlipidemia, unspecified: Secondary | ICD-10-CM | POA: Diagnosis not present

## 2021-12-22 HISTORY — DX: Acute embolism and thrombosis of unspecified deep veins of unspecified lower extremity: I82.409

## 2021-12-22 HISTORY — PX: CYSTOSCOPY WITH INSERTION OF UROLIFT: SHX6678

## 2021-12-22 HISTORY — DX: Basal cell carcinoma of skin of unspecified parts of face: C44.310

## 2021-12-22 HISTORY — DX: Diverticulosis of intestine, part unspecified, without perforation or abscess without bleeding: K57.90

## 2021-12-22 HISTORY — DX: Personal history of other diseases of the circulatory system: Z86.79

## 2021-12-22 HISTORY — DX: Unspecified glaucoma: H40.9

## 2021-12-22 SURGERY — CYSTOSCOPY WITH INSERTION OF UROLIFT
Anesthesia: General | Site: Bladder

## 2021-12-22 MED ORDER — ACETAMINOPHEN 10 MG/ML IV SOLN
INTRAVENOUS | Status: DC | PRN
  Administered 2021-12-22: 1000 mg via INTRAVENOUS

## 2021-12-22 MED ORDER — OXYCODONE HCL 5 MG PO TABS: 5.0000 mg | ORAL_TABLET | Freq: Once | ORAL | Status: DC | PRN

## 2021-12-22 MED ORDER — FENTANYL CITRATE (PF) 100 MCG/2ML IJ SOLN
INTRAMUSCULAR | Status: AC
  Filled 2021-12-22: qty 2

## 2021-12-22 MED ORDER — FENTANYL CITRATE (PF) 100 MCG/2ML IJ SOLN: 25.0000 ug | INTRAMUSCULAR | Status: DC | PRN

## 2021-12-22 MED ORDER — PROPOFOL 10 MG/ML IV BOLUS
INTRAVENOUS | Status: DC | PRN
  Administered 2021-12-22: 10 mg via INTRAVENOUS
  Administered 2021-12-22: 50 mg via INTRAVENOUS
  Administered 2021-12-22: 10 mg via INTRAVENOUS

## 2021-12-22 MED ORDER — ONDANSETRON HCL 4 MG/2ML IJ SOLN
INTRAMUSCULAR | Status: DC | PRN
  Administered 2021-12-22: 4 mg via INTRAVENOUS

## 2021-12-22 MED ORDER — GLYCOPYRROLATE 0.2 MG/ML IJ SOLN
INTRAMUSCULAR | Status: DC | PRN
  Administered 2021-12-22: .2 mg via INTRAVENOUS

## 2021-12-22 MED ORDER — OXYCODONE HCL 5 MG/5ML PO SOLN: 5.0000 mg | Freq: Once | ORAL | Status: DC | PRN

## 2021-12-22 MED ORDER — PROPOFOL 500 MG/50ML IV EMUL
INTRAVENOUS | Status: DC | PRN
  Administered 2021-12-22: 100 ug/kg/min via INTRAVENOUS

## 2021-12-22 MED ORDER — ACETAMINOPHEN 10 MG/ML IV SOLN
INTRAVENOUS | Status: AC
  Filled 2021-12-22: qty 300

## 2021-12-22 MED ORDER — FAMOTIDINE 20 MG PO TABS
ORAL_TABLET | ORAL | Status: AC
  Administered 2021-12-22: 20 mg via ORAL
  Filled 2021-12-22: qty 1

## 2021-12-22 MED ORDER — CEFAZOLIN SODIUM-DEXTROSE 2-4 GM/100ML-% IV SOLN
INTRAVENOUS | Status: AC
  Filled 2021-12-22: qty 100

## 2021-12-22 MED ORDER — DEXAMETHASONE SODIUM PHOSPHATE 10 MG/ML IJ SOLN
INTRAMUSCULAR | Status: DC | PRN
  Administered 2021-12-22: 10 mg via INTRAVENOUS

## 2021-12-22 MED ORDER — CHLORHEXIDINE GLUCONATE 0.12 % MT SOLN
OROMUCOSAL | Status: AC
  Administered 2021-12-22: 15 mL via OROMUCOSAL
  Filled 2021-12-22: qty 15

## 2021-12-22 SURGICAL SUPPLY — 18 items
BAG DRAIN SIEMENS DORNER NS (MISCELLANEOUS) ×1 IMPLANT
BAG DRN NS LF (MISCELLANEOUS) ×1
GAUZE 4X4 16PLY ~~LOC~~+RFID DBL (SPONGE) ×2 IMPLANT
GLOVE BIO SURGEON STRL SZ 6.5 (GLOVE) ×1 IMPLANT
GOWN STRL REUS W/ TWL LRG LVL3 (GOWN DISPOSABLE) ×2 IMPLANT
GOWN STRL REUS W/TWL LRG LVL3 (GOWN DISPOSABLE) ×2
GUIDEWIRE STR DUAL SENSOR (WIRE) IMPLANT
KIT TURNOVER CYSTO (KITS) ×1 IMPLANT
MANIFOLD NEPTUNE II (INSTRUMENTS) ×1 IMPLANT
PACK CYSTO AR (MISCELLANEOUS) ×1 IMPLANT
SET CYSTO W/LG BORE CLAMP LF (SET/KITS/TRAYS/PACK) ×1 IMPLANT
SURGILUBE 2OZ TUBE FLIPTOP (MISCELLANEOUS) IMPLANT
SYSTEM UROLIFT 2 CART W/ HNDL (Male Continence) ×1 IMPLANT
SYSTEM UROLIFT 2 CARTRIDGE (Male Continence) IMPLANT
TRAP FLUID SMOKE EVACUATOR (MISCELLANEOUS) ×1 IMPLANT
WATER STERILE IRR 1000ML POUR (IV SOLUTION) ×1 IMPLANT
WATER STERILE IRR 3000ML UROMA (IV SOLUTION) ×1 IMPLANT
WATER STERILE IRR 500ML POUR (IV SOLUTION) ×1 IMPLANT

## 2021-12-22 NOTE — Anesthesia Procedure Notes (Signed)
Procedure Name: General with mask airway Date/Time: 12/22/2021 11:22 AM  Performed by: Kelton Pillar, CRNAPre-anesthesia Checklist: Patient identified, Emergency Drugs available, Suction available and Patient being monitored Patient Re-evaluated:Patient Re-evaluated prior to induction Oxygen Delivery Method: Simple face mask Induction Type: IV induction Placement Confirmation: positive ETCO2, CO2 detector and breath sounds checked- equal and bilateral Dental Injury: Teeth and Oropharynx as per pre-operative assessment

## 2021-12-22 NOTE — Transfer of Care (Signed)
Immediate Anesthesia Transfer of Care Note  Patient: Garrett Moore  Procedure(s) Performed: CYSTOSCOPY WITH INSERTION OF UROLIFT (Bladder)  Patient Location: PACU  Anesthesia Type:General  Level of Consciousness: awake, drowsy and patient cooperative  Airway & Oxygen Therapy: Patient Spontanous Breathing and Patient connected to face mask oxygen  Post-op Assessment: Report given to RN and Post -op Vital signs reviewed and stable  Post vital signs: Reviewed and stable  Last Vitals:  Vitals Value Taken Time  BP 107/58 12/22/21 1133  Temp    Pulse 76 12/22/21 1135  Resp 16 12/22/21 1135  SpO2 100 % 12/22/21 1135  Vitals shown include unvalidated device data.  Last Pain:  Vitals:   12/22/21 0928  TempSrc: Temporal  PainSc: 0-No pain         Complications: No notable events documented.

## 2021-12-22 NOTE — Anesthesia Preprocedure Evaluation (Signed)
Anesthesia Evaluation  Patient identified by MRN, date of birth, ID band Patient awake    Reviewed: Allergy & Precautions, H&P , NPO status , Patient's Chart, lab work & pertinent test results  History of Anesthesia Complications Negative for: history of anesthetic complications  Airway Mallampati: II  TM Distance: >3 FB     Dental  (+) Upper Dentures, Lower Dentures   Pulmonary neg sleep apnea, COPD, former smoker,    breath sounds clear to auscultation       Cardiovascular (-) angina+ CAD, + Past MI and + Peripheral Vascular Disease  (-) Cardiac Stents (-) dysrhythmias + Valvular Problems/Murmurs AS  Rhythm:regular Rate:Normal  AAA unruptured HFpEF, Grade 1 diastolic dysfunction Moderate AS   Neuro/Psych negative neurological ROS  negative psych ROS   GI/Hepatic negative GI ROS, Neg liver ROS,   Endo/Other  negative endocrine ROS  Renal/GU Renal disease (CKD)     Musculoskeletal   Abdominal   Peds  Hematology negative hematology ROS (+)   Anesthesia Other Findings Past Medical History: 05/10/2020: Aneurysm of infrarenal abdominal aorta (HCC)     Comment:  5.9 cm No date: Aortic atherosclerosis ([place]) 02/08/2018: Aortic valvar stenosis     Comment:  Mild to Moderate Noted on ECHO No date: Basal cell carcinoma     Comment:  Face No date: Carotid artery stenosis No date: CKD (chronic kidney disease), stage III (HCC) No date: COPD (chronic obstructive pulmonary disease) (HCC) No date: Coronary artery disease No date: DVT (deep venous thrombosis) (HCC)     Comment:  age 62, leg after Tonsillectomy No date: Family history of adverse reaction to anesthesia     Comment:  mother under anesthesia for extended period for AAA               memory problems/dementia after 97/0263: Grade I diastolic dysfunction No date: Heart murmur No date: Hyperlipidemia 07/1983: Myocardial infarction ([place]) No date: OA  (osteoarthritis)  Past Surgical History: 08/14/1983: CARDIAC CATHETERIZATION 01/23/2011: CARDIOVASCULAR STRESS TEST     Comment:  normal myocardial perfusion scan demonstrating an               attenuation artifact in the inferior region of the               myocardium. no ischemia or infarct/scar seen in remaining              myocardium 05/21/2006: CAROTID DUPLEX     Comment:  right bulb-0-49% diameter reduction; left bulb and               proximal ICA-0-49% diameter reduction 01/2019: CATARACT EXTRACTION, BILATERAL 07/1983: CORONARY ANGIOPLASTY     Comment:  PTCA Prox RCA 07/21/2012: DOPPLER ECHOCARDIOGRAPHY     Comment:  EF 50-55%, aortic valve noncoronary cusp mobility was               severely restricted. No date: ROTATOR CUFF REPAIR; Left 07/1983: TEMPORARY PACEMAKER No date: TONSILLECTOMY 02/21/2019: TOTAL HIP ARTHROPLASTY; Left     Comment:  Procedure: TOTAL HIP ARTHROPLASTY ANTERIOR APPROACH;                Surgeon: Paralee Cancel, MD;  Location: [facility];  Service:               Orthopedics;  Laterality: Left;  70 mins     Reproductive/Obstetrics negative OB ROS  Anesthesia Physical  Anesthesia Plan  ASA: 3  Anesthesia Plan: General ETT   Post-op Pain Management:    Induction: Intravenous  PONV Risk Score and Plan: Ondansetron, Treatment may vary due to age or medical condition, Propofol infusion and TIVA  Airway Management Planned: Natural Airway and Nasal Cannula  Additional Equipment:   Intra-op Plan:   Post-operative Plan: Extubation in OR  Informed Consent: I have reviewed the patients History and Physical, chart, labs and discussed the procedure including the risks, benefits and alternatives for the proposed anesthesia with the patient or authorized representative who has indicated his/her understanding and acceptance.     Dental Advisory Given  Plan Discussed with: Anesthesiologist, CRNA and  Surgeon  Anesthesia Plan Comments: (Patient consented for risks of anesthesia including but not limited to:  - adverse reactions to medications - damage to eyes, teeth, lips or other oral mucosa - nerve damage due to positioning  - sore throat or hoarseness - Damage to heart, brain, nerves, lungs, other parts of body or loss of life  Patient voiced understanding.)        Anesthesia Quick Evaluation

## 2021-12-22 NOTE — Op Note (Signed)
Preoperative diagnosis: BPH with obstructive symptomatology   Postoperative diagnosis: BPH with obstructive symptomatology   Principal procedure: Urolift procedure, with the placement of 6 implants.   Surgeon: Hollice Espy   Anesthesia: MAC   Complications: None   Drains: None   Estimated blood loss: < 5 mL   Indications: 81 year-old male with obstructive symptomatology secondary to BPH.  The patient's symptoms have progressed, and he has requested further management.  Management options including TURP with resection/ablation of the prostate as well as Urolift were discussed.  The patient has chosen to have a Urolift procedure.  He has been instructed to the procedure as well as risks and complications which include but are not limited to infection, bleeding, and inadequate treatment with the Urolift procedure alone, anesthetic complications, among others.  He understands these and desires to proceed.   Findings: Using the 17 French cystoscope, urethra and bladder were inspected.  There were no urethral lesions.  Prostatic urethra was obstructed secondary to bilobar hypertrophy.  The bladder was inspected circumferentially.  This revealed normal findings.   Description of procedure: The patient was properly identified in the holding area.  He received preoperative IV antibiotics.  He was taken to the operating room where MAC.  He is placed in the dorsolithotomy position.  Genitalia and perineum were prepped and draped.  Proper timeout was performed.  Male sounds were used to dilate the urethra.  I initially entered using the 17 French cystoscope however at the bulbar urethra, there was an area of narrowing and is a precaution, I exchanged the scope for the 21 French cystoscope where there was a working channel.  A wire was inserted through this area of narrowing which was approximately 16 [place] in diameter.  I then was able to gently dilate this slight bulbar urethral stricture using the  scope alone.  Upon removing the scope, there is minimal to no urethral trauma appreciated.   A 45F cystoscope was inserted into the bladder with findings as described above.  The 1st pair of implants were placed at the bladder neck ~1.5 cm from the bladder Neck. The 2nd pair of implants were placed at the level of the verumontanum.   A repeat cysto was performed and another pair of implants in between the prior pairs.  On the left side, I used a stacking technique.   A final cystoscopy was conducted first to inspect the location and state of each implant and second, to confirm the presence of a continuous anterior channel was present through the prostatic urethra with irrigation flow turned off.    6 implants were delivered in total.    Following this, the scope was removed.  After anesthetic reversal he was transported to the PACU in stable condition.  He tolerated the procedure well.   Plan: He will follow-up with me in 4 to 6 weeks for IPSS/PVR.

## 2021-12-22 NOTE — H&P (Signed)
[facility] CTAB  CC:      Chief Complaint  Patient presents with   Cysto      TRUS        HPI: Garrett Moore is a 81 y.o. male with a personal history of left UVJ stone , urinary retention, and BPH who returns today for a cystoscopy/ TRUS further assessment of his urinary issues.   He was taken to the OR on 08/12/2020 for a complex ureteroscopy for treatment of chronically obstructing distal UVJ stone in the setting of a complete left duplicated collecting system.Stone analysis revealed a 100% calcium oxalate monohydrate stone.   RUS on 09/24/2020 visualized resolution of bilateral hydronephrosis and otherwise unremarkable.   He underwent a CT angio, abdomen and pelvis on 09/24/2021 that visualized mild diffuse thinning and renal cortices. 2 simple left renal cyst measuring up to 2.2 cm.    KUB on 10/02/2021 visualized left renal vascular calcifications unchanged compared to prior CT.    His UA on 10/22/2021 was suspicious with > 30 WBC's, nitrite positive with many bacteria. Cystoscopy was deferred and he was treated and the interim with bactrim DS x7days. Urine culture grew Enterobacter cloacae complex that was resistant to amoxicillin cefazolin, cefuroxime, and otherwise pan sensitive.      Vitals:    11/05/21 1414  BP: 126/78  Pulse: 75    NED. A&Ox3.   No respiratory distress   Abd soft, NT, ND Normal phallus with bilateral descended testicles   Cystoscopy Procedure Note   Patient identification was confirmed, informed consent was obtained, and patient was prepped using Betadine solution.  Lidocaine jelly was administered per urethral meatus.       Pre-Procedure: - Inspection reveals a normal caliber ureteral meatus.   Procedure: The flexible cystoscope was introduced without difficulty - Very mild fossa navicularis stricture, mild bulbar urethral narrowing - Enlarged prostate bilobar coaptation - Normal bladder neck - Bilateral ureteral orifices identified - Bladder  mucosa  reveals no ulcers, tumors, or lesions - No bladder stones - Moderate trabeculation   Retroflexion shows unremarkable     Post-Procedure: - Patient tolerated the procedure well     Prostate transrectal ultrasound sizing   Informed consent was obtained after discussing risks/benefits of the procedure.  A time out was performed to ensure correct patient identity.   Pre-Procedure: -Transrectal probe was placed without difficulty -Transrectal Ultrasound performed revealing a 58.03 gm prostate measuring 3.96 x 4.76 x 5.88 cm (length) -No significant hypoechoic or median lobe noted   Past Medical History:  Diagnosis Date   Aneurysm of infrarenal abdominal aorta ([place]) 02/08/2018   a.) TTE 02/08/2018: Ao root 38 mm, asc Ao 40 mm; b.) TTE 06/14/2020; asc Ao 28 mm; c.) CT hematuria 05/10/2020: infrarenal AAA measuring 5.9 cm; d.) s/p EVAR 07/03/2020; e.) CT A/P 10/14/2021: residual aneurysmal sac (s/p EVAR) measured 6.0 cm   Aortic atherosclerosis (HCC)    Aortic valvar stenosis 07/21/2012   a.) TTE 07/21/2012: EF 50-55%, mild AS (AVA = 1.67, MPG 9); b.) TTE 02/08/2018: EF 60-65%; mod AS (MPG 17); c.) TTE 06/14/2020: EF 60-65%, mod AS (MPG 26.8)   Atherosclerosis of native arteries of extremity with intermittent claudication (HCC)    Basal cell carcinoma, face    BPH (benign prostatic hyperplasia)    Carotid artery stenosis 05/21/2006   a.) carotid US 05/21/2006: 0-49% BICA; b.) carotid US 06/10/2020: 1-39% RICA, total occ LICA   CKD (chronic kidney disease), stage III (HCC)    COPD (chronic obstructive  pulmonary disease) ([place])    Coronary artery disease 08/14/1983   a.) MI --> LHC 08/13/2016: CTO of the RCA --> PTCA performed resulting in 30% residual stenosis; aberrant takeoff of his RCA coming off somewhat high and posteriorly.   Diastolic dysfunction 01/21/5944   a.) TTE 02/08/2018: EF 60-65%, mild MAC,  triv TR, mild-mod AS, G1DD; b.) TTE 06/14/2020: EF 60-65%, mod AS, triv TR,  mild AR, G1DD   Diverticulosis    Family history of adverse reaction to anesthesia    a.) extended GA course for mother --> postoperaitve memory problems/dementia   GERD (gastroesophageal reflux disease)    Glaucoma    Heart murmur    History of atrial fibrillation    a.) suspect in setting of admission for MI; recent ECGs reveal NSR   History of kidney stones    Hyperlipidemia    Iliac artery aneurysm, bilateral ([place]) 07/03/2020   a.) s/p insertion of BILATERAL iliac artery stents   Myocardial infarction ([place]) 08/13/2016   a.) MI --> LHC 08/13/2016: CTO of the RCA --> PTCA performed resulting in 30% residual stenosis. Procedure complicated by procedural nausea, Patient went into IVR and then AV disaccociated rhythm. SBP dropped (80s). TVP floated with capture, which stabilized patient. ICU admission overnight.   OA (osteoarthritis)    Peripheral vascular disease (HCC)    Postoperative deep vein thrombosis (DVT) ([place])    a.) postop age 25 following tonsillectomy (per patient report)   Scheduled Meds: Continuous Infusions:  ceFAZolin      ceFAZolin (ANCEF) IV     lactated ringers 10 mL/hr at 12/22/21 0943   PRN Meds:.ceFAZolin    Assessment/ Plan:     1. Benign prostatic hyperplasia with urinary obstruction Prostate is relatively small however he does have moderate trabeculation and evidence of chronic outlet obstruction   He has not tolerated medications and does not desire to continue these, interested in an outlet procedure.   We discussed various options including TURP versus HoLEP versus UroLift.  He is most interested in UroLift after discussing risk and benefits of each.   He was given literature about UroLift today.  We discussed the efficacy rate, risk of irritative voiding symptoms, possibility of the need for Foley catheter albeit relatively low if he is able to void in the postoperative holding area, pelvic pain, clip malfunction or migration, infection and hematuria.   He will need clearance.  All questions answered.   - Urinalysis, Complete   2. History of urinary retention As above

## 2021-12-22 NOTE — Discharge Instructions (Addendum)
  Urolift Post-Operative Instructions     Patient Expectations   1. Mild blood in your urine for about 1 week.  2. Urinary buring, frequency, and urgency for 10 days.  3. Mild pelvic pain 1-2 weeks.     Return to Activity     1. Drink water post procedure.  2. Take meds as needed.  Tylenol and/or Motrin is most helpful.  You may also by Pyridium/Azo over-the-counter for urinary burning.  3. No lifting or straining 48hrs.  4. Other activity when they feel up to it.    AMBULATORY SURGERY  DISCHARGE INSTRUCTIONS   The drugs that you were given will stay in your system until tomorrow so for the next 24 hours you should not:  Drive an automobile Make any legal decisions Drink any alcoholic beverage   You may resume regular meals tomorrow.  Today it is better to start with liquids and gradually work up to solid foods.  You may eat anything you prefer, but it is better to start with liquids, then soup and crackers, and gradually work up to solid foods.   Please notify your doctor immediately if you have any unusual bleeding, trouble breathing, redness and pain at the surgery site, drainage, fever, or pain not relieved by medication.    Additional Instructions:        Please contact your physician with any problems or Same Day Surgery at [phone], Monday through Friday 6 am to 4 pm, or [place] at [facility] number at [phone].

## 2021-12-23 ENCOUNTER — Encounter: Payer: Self-pay | Admitting: Urology

## 2021-12-23 NOTE — Anesthesia Postprocedure Evaluation (Signed)
Anesthesia Post Note  Patient: Garrett Moore  Procedure(s) Performed: CYSTOSCOPY WITH INSERTION OF UROLIFT (Bladder)  Patient location during evaluation: PACU Anesthesia Type: General Level of consciousness: awake and alert Pain management: pain level controlled Vital Signs Assessment: post-procedure vital signs reviewed and stable Respiratory status: spontaneous breathing, nonlabored ventilation, respiratory function stable and patient connected to nasal cannula oxygen Cardiovascular status: blood pressure returned to baseline and stable Postop Assessment: no apparent nausea or vomiting Anesthetic complications: no   No notable events documented.   Last Vitals:  Vitals:   12/22/21 1145 12/22/21 1157  BP: (!) 112/58 122/64  Pulse: 73   Resp: 16 16  Temp: (!) 36.1 C   SpO2: 98%     Last Pain:  Vitals:   12/23/21 1016  TempSrc:   PainSc: [name]

## 2021-12-26 ENCOUNTER — Telehealth: Payer: Self-pay | Admitting: *Deleted

## 2021-12-26 NOTE — Telephone Encounter (Signed)
Pt called back, stated he passed a large clot dark in color. No pain, he doesn't know if he's emptying his bladder but definitely no difficulty urinating. Pt also states he is not having rectal pain, last BM was 10 min before he called. Will bring pt in for PVR. Please advise

## 2021-12-26 NOTE — Telephone Encounter (Signed)
Spoke with patient and advised results, he will call if the clots get large or he can not urinate.

## 2021-12-26 NOTE — Telephone Encounter (Signed)
It certainly can be normal if they are small but if they are large/ many or he feels like he cannot empty his bladder, he probably needs to be seen today by me or Sam for UA/ PVR.  Hollice Espy, MD

## 2021-12-26 NOTE — Telephone Encounter (Signed)
Pt calling asking if passing clots is normal after having urolift? Pt states he seeing more and hoping they will clear up. No fever, nausea, not feeling like UTI, just going to urinate more. I explained that his surgery was Monday and it will take time to heal and start to see improvement. Please advise

## 2021-12-26 NOTE — Telephone Encounter (Signed)
Spoke with patient and he is not having any abd pain, no fever and the clots are not big, then per sam pt should be ok, drink plenty of fluids and if he can not urinate thru the weekend then to go to the ER.  Pt agreeable and will call on Monday for a update.

## 2022-01-19 ENCOUNTER — Encounter (INDEPENDENT_AMBULATORY_CARE_PROVIDER_SITE_OTHER): Payer: Self-pay

## 2022-01-26 ENCOUNTER — Other Ambulatory Visit (INDEPENDENT_AMBULATORY_CARE_PROVIDER_SITE_OTHER): Payer: Medicare HMO

## 2022-01-26 ENCOUNTER — Ambulatory Visit (INDEPENDENT_AMBULATORY_CARE_PROVIDER_SITE_OTHER): Payer: Medicare HMO | Admitting: Nurse Practitioner

## 2022-01-27 ENCOUNTER — Ambulatory Visit: Payer: Medicare HMO | Admitting: Urology

## 2022-01-30 ENCOUNTER — Other Ambulatory Visit (INDEPENDENT_AMBULATORY_CARE_PROVIDER_SITE_OTHER): Payer: Self-pay | Admitting: Vascular Surgery

## 2022-01-30 ENCOUNTER — Encounter (INDEPENDENT_AMBULATORY_CARE_PROVIDER_SITE_OTHER): Payer: Self-pay | Admitting: Vascular Surgery

## 2022-01-30 DIAGNOSIS — I7143 Infrarenal abdominal aortic aneurysm, without rupture: Secondary | ICD-10-CM

## 2022-02-03 ENCOUNTER — Ambulatory Visit: Payer: Medicare HMO | Admitting: Urology

## 2022-02-03 VITALS — BP 119/75 | HR 76 | Ht 71.0 in | Wt 180.0 lb

## 2022-02-03 DIAGNOSIS — N400 Enlarged prostate without lower urinary tract symptoms: Secondary | ICD-10-CM

## 2022-02-03 DIAGNOSIS — Z87442 Personal history of urinary calculi: Secondary | ICD-10-CM

## 2022-02-03 NOTE — Progress Notes (Signed)
02/03/2022 9:57 AM   Garrett Moore [birth year] [id number]  Referring provider: [name], MD [place],  [place] [address]    HPI: 81 year old male who returns today for postop follow-up 6 weeks status post UroLift completed on 12/22/2021.  He has a personal history of kidney stones, urinary retention and BPH.  TRUS volume 58 g.  Preprocedure IPSS 28.  PVRs were elevated in the 100 range previously.  Today he reports that the first 2 weeks were somewhat rocky, intermittent bleeding and dysuria although had almost immediate improvement in stream.  After 2 weeks, his urinary symptoms have almost completely resolved.  IPSS as below.  He is emptying well, extremely pleased with the outcome.  He is not on any BPH medications.  PVR 0 cc   IPSS     Row Name 02/03/22 0900         International Prostate Symptom Score   How often have you had the sensation of not emptying your bladder? Not at All     How often have you had to urinate less than every two hours? Not at All     How often have you found you stopped and started again several times when you urinated? Not at All     How often have you found it difficult to postpone urination? Not at All     How often have you had a weak urinary stream? Not at All     How often have you had to strain to start urination? Not at All     How many times did you typically get up at night to urinate? 1 Time     Total IPSS Score 1       Quality of Life due to urinary symptoms   If you were to spend the rest of your life with your urinary condition just the way it is now how would you feel about that? Delighted              Score:  1-7 Mild 8-19 Moderate 20-35 Severe    PMH: Past Medical History:  Diagnosis Date   Aneurysm of infrarenal abdominal aorta ([place]) 02/08/2018   a.) TTE 02/08/2018: Ao root 38 mm, asc Ao 40 mm; b.) TTE 06/14/2020; asc Ao 28 mm; c.) CT hematuria 05/10/2020: infrarenal AAA measuring 5.9  cm; d.) s/p EVAR 07/03/2020; e.) CT A/P 10/14/2021: residual aneurysmal sac (s/p EVAR) measured 6.0 cm   Aortic atherosclerosis (HCC)    Aortic valvar stenosis 07/21/2012   a.) TTE 07/21/2012: EF 50-55%, mild AS (AVA = 1.67, MPG 9); b.) TTE 02/08/2018: EF 60-65%; mod AS (MPG 17); c.) TTE 06/14/2020: EF 60-65%, mod AS (MPG 26.8)   Atherosclerosis of native arteries of extremity with intermittent claudication (HCC)    Basal cell carcinoma, face    BPH (benign prostatic hyperplasia)    Carotid artery stenosis 05/21/2006   a.) carotid US 05/21/2006: 0-49% BICA; b.) carotid US 06/10/2020: 1-39% RICA, total occ LICA   CKD (chronic kidney disease), stage III (HCC)    COPD (chronic obstructive pulmonary disease) ([place])    Coronary artery disease 08/14/1983   a.) MI --> LHC 08/13/2016: CTO of the RCA --> PTCA performed resulting in 30% residual stenosis; aberrant takeoff of his RCA coming off somewhat high and posteriorly.   Diastolic dysfunction 44/31/5400   a.) TTE 02/08/2018: EF 60-65%, mild MAC,  triv TR, mild-mod AS, G1DD; b.) TTE 06/14/2020: EF 60-65%, mod AS, triv TR,  mild AR, G1DD   Diverticulosis    Family history of adverse reaction to anesthesia    a.) extended GA course for mother --> postoperaitve memory problems/dementia   GERD (gastroesophageal reflux disease)    Glaucoma    Heart murmur    History of atrial fibrillation    a.) suspect in setting of admission for MI; recent ECGs reveal NSR   History of kidney stones    Hyperlipidemia    Iliac artery aneurysm, bilateral ([place]) 07/03/2020   a.) s/p insertion of BILATERAL iliac artery stents   Myocardial infarction ([place]) 08/13/2016   a.) MI --> LHC 08/13/2016: CTO of the RCA --> PTCA performed resulting in 30% residual stenosis. Procedure complicated by procedural nausea, Patient went into IVR and then AV disaccociated rhythm. SBP dropped (80s). TVP floated with capture, which stabilized patient. ICU admission overnight.   OA  (osteoarthritis)    Peripheral vascular disease (HCC)    Postoperative deep vein thrombosis (DVT) ([place])    a.) postop age 39 following tonsillectomy (per patient report)    Surgical History: Past Surgical History:  Procedure Laterality Date   CATARACT EXTRACTION, BILATERAL  01/2019   CORONARY ANGIOPLASTY  08/14/1983   Procedure: CORONARY ANGIOPLASTY (PTCA pRCA); [facility]: [facility]; Surgeon: Al Little, MD   CYSTOSCOPY W/ RETROGRADES  08/12/2020   Procedure: CYSTOSCOPY WITH RETROGRADE PYELOGRAM;  Surgeon: Hollice Espy, MD;  Location: [facility];  Service: Urology;;   CYSTOSCOPY WITH INSERTION OF UROLIFT N/A 12/22/2021   Procedure: CYSTOSCOPY WITH INSERTION OF UROLIFT;  Surgeon: Hollice Espy, MD;  Location: [facility];  Service: Urology;  Laterality: N/A;   CYSTOSCOPY/URETEROSCOPY/HOLMIUM LASER/STENT PLACEMENT Left 08/12/2020   Procedure: CYSTOSCOPY/URETEROSCOPY/HOLMIUM LASER/STENT PLACEMENT;  Surgeon: Hollice Espy, MD;  Location: [facility];  Service: Urology;  Laterality: Left;   ENDOVASCULAR REPAIR/STENT GRAFT N/A 07/03/2020   Procedure: ENDOVASCULAR REPAIR/STENT GRAFT;  Surgeon: Katha Cabal, MD;  Location: [facility];  Service: Cardiovascular;  Laterality: N/A;   PELVIC ANGIOGRAPHY Right 10/14/2021   Procedure: PELVIC ANGIOGRAPHY;  Surgeon: Katha Cabal, MD;  Location: [facility];  Service: Cardiovascular;  Laterality: Right;   PERIPHERAL VASCULAR THROMBECTOMY Right 10/14/2021   Procedure: PERIPHERAL VASCULAR THROMBECTOMY;  Surgeon: Katha Cabal, MD;  Location: [facility];  Service: Cardiovascular;  Laterality: Right;   ROTATOR CUFF REPAIR Left    TEMPORARY PACEMAKER  08/14/1983   Procedure: TEMPORARY PACEMAKER PLACEMENT; [facility]: [facility]; Surgeon: Al Little, MD   TONSILLECTOMY     TOTAL HIP ARTHROPLASTY Left 02/21/2019   Procedure: TOTAL HIP ARTHROPLASTY ANTERIOR APPROACH;  Surgeon: Paralee Cancel, MD;  Location: [facility];  Service:  Orthopedics;  Laterality: Left;  70 mins   VITRECTOMY Bilateral     Home Medications:  Allergies as of 02/03/2022       Reactions   Quinolones Other (See Comments)   Other reaction(s): Has aortic root dilatation   Sulfa Antibiotics Rash        Medication List        Accurate as of February 03, 2022  9:57 AM. If you have any questions, ask your nurse or doctor.          aspirin EC 81 MG tablet Take 81 mg by mouth at bedtime. Swallow whole.   multivitamin with minerals Tabs tablet Take 1 tablet by mouth daily.   nicotine polacrilex 2 MG lozenge Commonly known as: COMMIT Take 2 mg by mouth as needed for smoking cessation.   simvastatin 80 MG tablet  Commonly known as: ZOCOR Take 80 mg by mouth at bedtime.        Allergies:  Allergies  Allergen Reactions   Quinolones Other (See Comments)    Other reaction(s): Has aortic root dilatation   Sulfa Antibiotics Rash    Family History: Family History  Problem Relation Age of Onset   Heart Problems Mother    AAA (abdominal aortic aneurysm) Mother     Social History:  reports that he quit smoking about 19 years ago. His smoking use included cigarettes. He has a 100.00 pack-year smoking history. He has never used smokeless tobacco. He reports that he does not currently use alcohol. He reports that he does not use drugs.   Physical Exam: BP 119/75   Pulse 76   Ht _0  (1.803 m)   Wt 180 lb (81.6 kg)   BMI 25.10 kg/m   Constitutional:  Alert and oriented, No acute distress. HEENT: [place] AT, moist mucus membranes.  Trachea midline, no masses. Cardiovascular: No clubbing, cyanosis, or edema. Respiratory: Normal respiratory effort, no increased work of breathing. GI: Abdomen is soft, nontender, nondistended, no abdominal masses GU: No CVA tenderness Skin: No rashes, bruises or suspicious lesions. Neurologic: Grossly intact, no focal deficits, moving all 4 extremities. Psychiatric: Normal mood and  affect.  Laboratory Data: Lab Results  Component Value Date   WBC 7.0 12/16/2021   HGB 12.5 (L) 12/16/2021   HCT 39.6 12/16/2021   MCV 97.5 12/16/2021   PLT 254 12/16/2021    Lab Results  Component Value Date   CREATININE 1.37 (H) 12/16/2021   Assessment & Plan:    1. Benign prostatic hyperplasia without lower urinary tract symptoms Extremely robust and favorable outcome status post UroLift  Emptying well with dramatic improvement IPSS score  Not on any BPH medications at this time thus no need to titrate meds  Follow-up in 1 year with IPSS/PVR unless symptoms recur - Bladder Scan (Post Void Residual) in office  2. History of kidney stones Status post ureteroscopy in the past, will monitor for symptoms    Return in about 1 year (around 02/04/2023) for IPSS/PVR with PA.  Hollice Espy, MD  [facility] [address], [place], [place] [address] [phone]

## 2022-02-04 ENCOUNTER — Ambulatory Visit (INDEPENDENT_AMBULATORY_CARE_PROVIDER_SITE_OTHER): Payer: Medicare HMO | Admitting: Nurse Practitioner

## 2022-02-04 ENCOUNTER — Ambulatory Visit (INDEPENDENT_AMBULATORY_CARE_PROVIDER_SITE_OTHER): Payer: Medicare HMO

## 2022-02-04 ENCOUNTER — Encounter (INDEPENDENT_AMBULATORY_CARE_PROVIDER_SITE_OTHER): Payer: Self-pay | Admitting: Nurse Practitioner

## 2022-02-04 VITALS — BP 112/68 | HR 82 | Resp 18 | Ht 71.0 in | Wt 181.6 lb

## 2022-02-04 DIAGNOSIS — I745 Embolism and thrombosis of iliac artery: Secondary | ICD-10-CM

## 2022-02-04 DIAGNOSIS — I7143 Infrarenal abdominal aortic aneurysm, without rupture: Secondary | ICD-10-CM | POA: Diagnosis not present

## 2022-02-04 DIAGNOSIS — E782 Mixed hyperlipidemia: Secondary | ICD-10-CM

## 2022-02-04 DIAGNOSIS — I70211 Atherosclerosis of native arteries of extremities with intermittent claudication, right leg: Secondary | ICD-10-CM | POA: Diagnosis not present

## 2022-02-22 ENCOUNTER — Encounter (INDEPENDENT_AMBULATORY_CARE_PROVIDER_SITE_OTHER): Payer: Self-pay | Admitting: Nurse Practitioner

## 2022-02-22 NOTE — Progress Notes (Signed)
Subjective:    Patient ID: Garrett Moore, male    DOB: 1940-07-10, 81 y.o.   MRN: [id number] No chief complaint on file.   The patient returns to the office for followup and review of the noninvasive studies.    Previous angiogram 10/14/2021: The flow divider within the main body of the EVAR device is widely patent.  The left limb and left external iliac arteries are widely patent.  On the right the proximal portion of the iliac branch device is patent as is the stent leading into the internal iliac.  The internal iliac distally has extensive collaterals and is widely patent.  There is a flush occlusion of the external iliac stent.  Distally the external iliac artery is patent but moderate to severely diseased..   There have been no interval changes in lower extremity symptoms. He is now walking several miles a day and the pain that he was having in his hip is gone.  No  development of rest pain symptoms. No new ulcers or wounds have occurred since the last visit.   There have been no significant changes to the patient's overall health care.   The patient denies amaurosis fugax or recent TIA symptoms. There are no documented recent neurological changes noted. There is no history of DVT, PE or superficial thrombophlebitis. The patient denies recent episodes of angina or shortness of breath.   Today the right common iliac artery is noted to be occluded via ultrasound.  Largest aortic aneurysm measurement is 5.6 cm this is decreased from 5.7 from previous study is on 08/09/2021.  No endoleak is noted within endovascular graft.    Review of Systems  Skin:  Negative for wound.  All other systems reviewed and are negative.      Objective:   Physical Exam Vitals reviewed.  HENT:     Head: Normocephalic.  Cardiovascular:     Rate and Rhythm: Normal rate.  Pulmonary:     Effort: Pulmonary effort is normal.  Skin:    General: Skin is warm and dry.  Neurological:     Mental Status: He  is alert and oriented to person, place, and time.  Psychiatric:        Mood and Affect: Mood normal.        Behavior: Behavior normal.        Thought Content: Thought content normal.        Judgment: Judgment normal.     BP 112/68 (BP Location: Left Arm)   Pulse 82   Resp 18   Ht _0  (1.803 m)   Wt 181 lb 9.6 oz (82.4 kg)   BMI 25.33 kg/m   Past Medical History:  Diagnosis Date   Aneurysm of infrarenal abdominal aorta ([place]) 02/08/2018   a.) TTE 02/08/2018: Ao root 38 mm, asc Ao 40 mm; b.) TTE 06/14/2020; asc Ao 28 mm; c.) CT hematuria 05/10/2020: infrarenal AAA measuring 5.9 cm; d.) s/p EVAR 07/03/2020; e.) CT A/P 10/14/2021: residual aneurysmal sac (s/p EVAR) measured 6.0 cm   Aortic atherosclerosis (HCC)    Aortic valvar stenosis 07/21/2012   a.) TTE 07/21/2012: EF 50-55%, mild AS (AVA = 1.67, MPG 9); b.) TTE 02/08/2018: EF 60-65%; mod AS (MPG 17); c.) TTE 06/14/2020: EF 60-65%, mod AS (MPG 26.8)   Atherosclerosis of native arteries of extremity with intermittent claudication (HCC)    Basal cell carcinoma, face    BPH (benign prostatic hyperplasia)    Carotid artery stenosis 05/21/2006   a.)  carotid US 05/21/2006: 0-49% BICA; b.) carotid US 06/10/2020: 1-39% RICA, total occ LICA   CKD (chronic kidney disease), stage III (HCC)    COPD (chronic obstructive pulmonary disease) (HCC)    Coronary artery disease 08/14/1983   a.) MI --> LHC 08/13/2016: CTO of the RCA --> PTCA performed resulting in 30% residual stenosis; aberrant takeoff of his RCA coming off somewhat high and posteriorly.   Diastolic dysfunction 62/13/0865   a.) TTE 02/08/2018: EF 60-65%, mild MAC,  triv TR, mild-mod AS, G1DD; b.) TTE 06/14/2020: EF 60-65%, mod AS, triv TR, mild AR, G1DD   Diverticulosis    Family history of adverse reaction to anesthesia    a.) extended GA course for mother --> postoperaitve memory problems/dementia   GERD (gastroesophageal reflux disease)    Glaucoma    Heart murmur     History of atrial fibrillation    a.) suspect in setting of admission for MI; recent ECGs reveal NSR   History of kidney stones    Hyperlipidemia    Iliac artery aneurysm, bilateral ([place]) 07/03/2020   a.) s/p insertion of BILATERAL iliac artery stents   Myocardial infarction ([place]) 08/13/2016   a.) MI --> LHC 08/13/2016: CTO of the RCA --> PTCA performed resulting in 30% residual stenosis. Procedure complicated by procedural nausea, Patient went into IVR and then AV disaccociated rhythm. SBP dropped (80s). TVP floated with capture, which stabilized patient. ICU admission overnight.   OA (osteoarthritis)    Peripheral vascular disease (HCC)    Postoperative deep vein thrombosis (DVT) ([place])    a.) postop age 72 following tonsillectomy (per patient report)    Social History   Socioeconomic History   Marital status: Married    Spouse name: Garrett Moore   Number of children: 2   Years of education: Not on file   Highest education level: Not on file  Occupational History   Occupation: Freight forwarder of [employer]    Comment: retired  Tobacco Use   Smoking status: Former    Packs/day: 2.00    Years: 50.00    Total pack years: 100.00    Types: Cigarettes    Quit date: 2004    Years since quitting: 19.9   Smokeless tobacco: Never   Tobacco comments:    quit 17 years ago  Vaping Use   Vaping Use: Never used  Substance and Sexual Activity   Alcohol use: Not Currently   Drug use: Never   Sexual activity: Not on file  Other Topics Concern   Not on file  Social History Narrative   Patient lives with wife in his home.  He feels safe in his home.   No stairs.    Social Determinants of Health   Financial Resource Strain: Not on file  Food Insecurity: Not on file  Transportation Needs: Not on file  Physical Activity: Not on file  Stress: Not on file  Social Connections: Not on file  Intimate Partner Violence: Not on file    Past Surgical History:  Procedure Laterality Date    CATARACT EXTRACTION, BILATERAL  01/2019   CORONARY ANGIOPLASTY  08/14/1983   Procedure: CORONARY ANGIOPLASTY (PTCA pRCA); [facility]; Surgeon: Al Little, MD   CYSTOSCOPY W/ RETROGRADES  08/12/2020   Procedure: CYSTOSCOPY WITH RETROGRADE PYELOGRAM;  Surgeon: Hollice Espy, MD;  Location: [facility];  Service: Urology;;   CYSTOSCOPY WITH INSERTION OF [place] N/A 12/22/2021   Procedure: CYSTOSCOPY WITH INSERTION OF UROLIFT;  Surgeon: Hollice Espy, MD;  Location: [facility];  Service: Urology;  Laterality: N/A;   CYSTOSCOPY/URETEROSCOPY/HOLMIUM LASER/STENT PLACEMENT Left 08/12/2020   Procedure: CYSTOSCOPY/URETEROSCOPY/HOLMIUM LASER/STENT PLACEMENT;  Surgeon: Hollice Espy, MD;  Location: [facility];  Service: Urology;  Laterality: Left;   ENDOVASCULAR REPAIR/STENT GRAFT N/A 07/03/2020   Procedure: ENDOVASCULAR REPAIR/STENT GRAFT;  Surgeon: Katha Cabal, MD;  Location: [facility];  Service: Cardiovascular;  Laterality: N/A;   PELVIC ANGIOGRAPHY Right 10/14/2021   Procedure: PELVIC ANGIOGRAPHY;  Surgeon: Katha Cabal, MD;  Location: [facility];  Service: Cardiovascular;  Laterality: Right;   PERIPHERAL VASCULAR THROMBECTOMY Right 10/14/2021   Procedure: PERIPHERAL VASCULAR THROMBECTOMY;  Surgeon: Katha Cabal, MD;  Location: [facility];  Service: Cardiovascular;  Laterality: Right;   ROTATOR CUFF REPAIR Left    TEMPORARY PACEMAKER  08/14/1983   Procedure: TEMPORARY PACEMAKER PLACEMENT; [facility]; Surgeon: Al Little, MD   TONSILLECTOMY     TOTAL HIP ARTHROPLASTY Left 02/21/2019   Procedure: TOTAL HIP ARTHROPLASTY ANTERIOR APPROACH;  Surgeon: Paralee Cancel, MD;  Location: [facility];  Service: Orthopedics;  Laterality: Left;  70 mins   VITRECTOMY Bilateral     Family History  Problem Relation Age of Onset   Heart Problems Mother    AAA (abdominal aortic aneurysm) Mother     Allergies  Allergen Reactions   Quinolones Other (See  Comments)    Other reaction(s): Has aortic root dilatation   Sulfa Antibiotics Rash       Latest Ref Rng & Units 12/16/2021    8:12 AM 08/28/2020    6:01 PM 08/14/2020    6:12 PM  CBC  WBC 4.0 - 10.5 K/uL 7.0  18.9  15.2   Hemoglobin 13.0 - 17.0 g/dL 12.5  10.8  11.0   Hematocrit 39.0 - 52.0 % 39.6  33.5  32.9   Platelets 150 - 400 K/uL 254  313  329       CMP     Component Value Date/Time   NA 139 12/16/2021 0812   K 4.1 12/16/2021 0812   CL 108 12/16/2021 0812   CO2 25 12/16/2021 0812   GLUCOSE 104 (H) 12/16/2021 0812   BUN 18 12/16/2021 0812   CREATININE 1.37 (H) 12/16/2021 0812   CALCIUM 8.4 (L) 12/16/2021 0812   PROT 7.2 08/14/2020 1812   ALBUMIN 3.7 08/14/2020 1812   AST 31 08/14/2020 1812   ALT 13 08/14/2020 1812   ALKPHOS 67 08/14/2020 1812   BILITOT 0.9 08/14/2020 1812   GFRNONAA 52 (L) 12/16/2021 0812   GFRAA >60 02/22/2019 0236     No results found.     Assessment & Plan:   1. Atherosclerosis of native artery of right lower extremity with intermittent claudication (HCC)  Recommend:  The patient has evidence of atherosclerosis of the lower extremities with claudication.  The patient does not voice lifestyle limiting changes at this point in time.  Noninvasive studies do not suggest clinically significant change.  No invasive studies, angiography or surgery at this time The patient should continue walking and begin a more formal exercise program.  The patient should continue antiplatelet therapy and aggressive treatment of the lipid abnormalities  No changes in the patient's medications at this time  Continued surveillance is indicated as atherosclerosis is likely to progress with time.    The patient will continue follow up with noninvasive studies as ordered.   2. Occlusion of right iliac artery [facility]) Patient has a known occlusion of the right iliac artery.  Patient is  not having any significant claudication-like symptoms.  We will continue to  monitor annually as noted above.  3. Mixed hyperlipidemia Continue statin as ordered and reviewed, no changes at this time  4. Infrarenal abdominal aortic aneurysm (AAA) without rupture (HCC) Recommend:  Patient is status post successful endovascular repair of the AAA.   No further intervention is required at this time.   No endoleak is detected and the aneurysm sac is stable.  The patient will continue antiplatelet therapy as prescribed as well as aggressive management of hyperlipidemia. Exercise is again strongly encouraged.   However, endografts require continued surveillance with ultrasound or CT scan. This is mandatory to detect any changes that allow repressurization of the aneurysm sac.  The patient is informed that this would be asymptomatic.  The patient is reminded that lifelong routine surveillance is a necessity with an endograft. Patient will continue to follow-up at the specified interval with ultrasound of the aorta.   Current Outpatient Medications on File Prior to Visit  Medication Sig Dispense Refill   aspirin EC 81 MG tablet Take 81 mg by mouth at bedtime. Swallow whole.     Multiple Vitamin (MULTIVITAMIN WITH MINERALS) TABS tablet Take 1 tablet by mouth daily.     nicotine polacrilex (COMMIT) 2 MG lozenge Take 2 mg by mouth as needed for smoking cessation.     simvastatin (ZOCOR) 80 MG tablet Take 80 mg by mouth at bedtime.     Current Facility-Administered Medications on File Prior to Visit  Medication Dose Route Frequency Provider Last Rate Last Admin   cefTRIAXone (ROCEPHIN) injection 1 g  1 g Intramuscular Q24H Hollice Espy, MD   1 g at 08/21/20 1128    There are no Patient Instructions on file for this visit. No follow-ups on file.   Kris Hartmann, NP

## 2022-04-15 DIAGNOSIS — N183 Chronic kidney disease, stage 3 unspecified: Secondary | ICD-10-CM | POA: Diagnosis not present

## 2022-04-15 DIAGNOSIS — E782 Mixed hyperlipidemia: Secondary | ICD-10-CM | POA: Diagnosis not present

## 2022-04-27 DIAGNOSIS — I700 Atherosclerosis of aorta: Secondary | ICD-10-CM | POA: Diagnosis not present

## 2022-04-27 DIAGNOSIS — I7143 Infrarenal abdominal aortic aneurysm, without rupture: Secondary | ICD-10-CM | POA: Diagnosis not present

## 2022-04-27 DIAGNOSIS — J449 Chronic obstructive pulmonary disease, unspecified: Secondary | ICD-10-CM | POA: Diagnosis not present

## 2022-04-27 DIAGNOSIS — E782 Mixed hyperlipidemia: Secondary | ICD-10-CM | POA: Diagnosis not present

## 2022-04-27 DIAGNOSIS — I70211 Atherosclerosis of native arteries of extremities with intermittent claudication, right leg: Secondary | ICD-10-CM | POA: Diagnosis not present

## 2022-04-27 DIAGNOSIS — Z6825 Body mass index (BMI) 25.0-25.9, adult: Secondary | ICD-10-CM | POA: Diagnosis not present

## 2022-04-27 DIAGNOSIS — Z0000 Encounter for general adult medical examination without abnormal findings: Secondary | ICD-10-CM | POA: Diagnosis not present

## 2022-04-27 DIAGNOSIS — N1831 Chronic kidney disease, stage 3a: Secondary | ICD-10-CM | POA: Diagnosis not present

## 2022-04-27 DIAGNOSIS — Z79899 Other long term (current) drug therapy: Secondary | ICD-10-CM | POA: Diagnosis not present

## 2022-05-19 ENCOUNTER — Observation Stay (HOSPITAL_COMMUNITY)
Admission: EM | Admit: 2022-05-19 | Discharge: 2022-05-20 | Discharge status: Against Medical Advice | Payer: Medicare HMO | Attending: Internal Medicine | Admitting: Internal Medicine

## 2022-05-19 ENCOUNTER — Encounter (HOSPITAL_COMMUNITY): Payer: Self-pay | Admitting: Internal Medicine

## 2022-05-19 ENCOUNTER — Other Ambulatory Visit: Payer: Self-pay

## 2022-05-19 ENCOUNTER — Emergency Department (HOSPITAL_COMMUNITY): Payer: Medicare HMO

## 2022-05-19 DIAGNOSIS — E782 Mixed hyperlipidemia: Secondary | ICD-10-CM | POA: Diagnosis not present

## 2022-05-19 DIAGNOSIS — G459 Transient cerebral ischemic attack, unspecified: Principal | ICD-10-CM | POA: Insufficient documentation

## 2022-05-19 DIAGNOSIS — N1831 Chronic kidney disease, stage 3a: Secondary | ICD-10-CM | POA: Diagnosis not present

## 2022-05-19 DIAGNOSIS — Z87891 Personal history of nicotine dependence: Secondary | ICD-10-CM | POA: Diagnosis not present

## 2022-05-19 DIAGNOSIS — I6522 Occlusion and stenosis of left carotid artery: Secondary | ICD-10-CM | POA: Diagnosis not present

## 2022-05-19 DIAGNOSIS — Z85828 Personal history of other malignant neoplasm of skin: Secondary | ICD-10-CM | POA: Insufficient documentation

## 2022-05-19 DIAGNOSIS — Z7982 Long term (current) use of aspirin: Secondary | ICD-10-CM | POA: Diagnosis not present

## 2022-05-19 DIAGNOSIS — I5032 Chronic diastolic (congestive) heart failure: Secondary | ICD-10-CM | POA: Diagnosis not present

## 2022-05-19 DIAGNOSIS — J449 Chronic obstructive pulmonary disease, unspecified: Secondary | ICD-10-CM | POA: Insufficient documentation

## 2022-05-19 DIAGNOSIS — Z950 Presence of cardiac pacemaker: Secondary | ICD-10-CM | POA: Diagnosis not present

## 2022-05-19 DIAGNOSIS — Z8673 Personal history of transient ischemic attack (TIA), and cerebral infarction without residual deficits: Secondary | ICD-10-CM | POA: Diagnosis present

## 2022-05-19 DIAGNOSIS — Z96642 Presence of left artificial hip joint: Secondary | ICD-10-CM | POA: Insufficient documentation

## 2022-05-19 DIAGNOSIS — I480 Paroxysmal atrial fibrillation: Secondary | ICD-10-CM | POA: Diagnosis not present

## 2022-05-19 DIAGNOSIS — H5462 Unqualified visual loss, left eye, normal vision right eye: Secondary | ICD-10-CM

## 2022-05-19 DIAGNOSIS — I639 Cerebral infarction, unspecified: Secondary | ICD-10-CM | POA: Diagnosis not present

## 2022-05-19 DIAGNOSIS — I2510 Atherosclerotic heart disease of native coronary artery without angina pectoris: Secondary | ICD-10-CM | POA: Insufficient documentation

## 2022-05-19 DIAGNOSIS — Z79899 Other long term (current) drug therapy: Secondary | ICD-10-CM | POA: Diagnosis not present

## 2022-05-19 DIAGNOSIS — I350 Nonrheumatic aortic (valve) stenosis: Secondary | ICD-10-CM | POA: Diagnosis not present

## 2022-05-19 DIAGNOSIS — I6523 Occlusion and stenosis of bilateral carotid arteries: Secondary | ICD-10-CM | POA: Diagnosis not present

## 2022-05-19 DIAGNOSIS — E785 Hyperlipidemia, unspecified: Secondary | ICD-10-CM | POA: Diagnosis present

## 2022-05-19 LAB — I-STAT CHEM 8, ED
BUN: 18 mg/dL (ref 8–23)
Calcium, Ion: 1.09 mmol/L — ABNORMAL LOW (ref 1.15–1.40)
Chloride: 106 mmol/L (ref 98–111)
Creatinine, Ser: 1.4 mg/dL — ABNORMAL HIGH (ref 0.61–1.24)
Glucose, Bld: 112 mg/dL — ABNORMAL HIGH (ref 70–99)
HCT: 41 % (ref 39.0–52.0)
Hemoglobin: 13.9 g/dL (ref 13.0–17.0)
Potassium: 3.9 mmol/L (ref 3.5–5.1)
Sodium: 141 mmol/L (ref 135–145)
TCO2: 25 mmol/L (ref 22–32)

## 2022-05-19 LAB — APTT: aPTT: 27 seconds (ref 24–36)

## 2022-05-19 LAB — COMPREHENSIVE METABOLIC PANEL
ALT: 23 U/L (ref 0–44)
AST: 28 U/L (ref 15–41)
Albumin: 3.4 g/dL — ABNORMAL LOW (ref 3.5–5.0)
Alkaline Phosphatase: 77 U/L (ref 38–126)
Anion gap: 8 (ref 5–15)
BUN: 15 mg/dL (ref 8–23)
CO2: 26 mmol/L (ref 22–32)
Calcium: 8.6 mg/dL — ABNORMAL LOW (ref 8.9–10.3)
Chloride: 105 mmol/L (ref 98–111)
Creatinine, Ser: 1.41 mg/dL — ABNORMAL HIGH (ref 0.61–1.24)
GFR, Estimated: 50 mL/min — ABNORMAL LOW (ref >60)
Glucose, Bld: 116 mg/dL — ABNORMAL HIGH (ref 70–99)
Potassium: 3.9 mmol/L (ref 3.5–5.1)
Sodium: 139 mmol/L (ref 135–145)
Total Bilirubin: 0.5 mg/dL (ref 0.3–1.2)
Total Protein: 7.2 g/dL (ref 6.5–8.1)

## 2022-05-19 LAB — DIFFERENTIAL
Abs Immature Granulocytes: 0.03 10*3/uL (ref 0.00–0.07)
Basophils Absolute: 0.1 10*3/uL (ref 0.0–0.1)
Basophils Relative: 1 %
Eosinophils Absolute: 0.3 10*3/uL (ref 0.0–0.5)
Eosinophils Relative: 3 %
Immature Granulocytes: 0 %
Lymphocytes Relative: 21 %
Lymphs Abs: 2 10*3/uL (ref 0.7–4.0)
Monocytes Absolute: 1.7 10*3/uL — ABNORMAL HIGH (ref 0.1–1.0)
Monocytes Relative: 18 %
Neutro Abs: 5.5 10*3/uL (ref 1.7–7.7)
Neutrophils Relative %: 57 %

## 2022-05-19 LAB — CBC
HCT: 41.4 % (ref 39.0–52.0)
Hemoglobin: 13.8 g/dL (ref 13.0–17.0)
MCH: 32.2 pg (ref 26.0–34.0)
MCHC: 33.3 g/dL (ref 30.0–36.0)
MCV: 96.5 fL (ref 80.0–100.0)
Platelets: 279 10*3/uL (ref 150–400)
RBC: 4.29 MIL/uL (ref 4.22–5.81)
RDW: 12.8 % (ref 11.5–15.5)
WBC: 9.4 10*3/uL (ref 4.0–10.5)
nRBC: 0 % (ref 0.0–0.2)

## 2022-05-19 LAB — PROTIME-INR
INR: 1.1 (ref 0.8–1.2)
Prothrombin Time: 14 seconds (ref 11.4–15.2)

## 2022-05-19 LAB — ETHANOL: Alcohol, Ethyl (B): <10 mg/dL (ref <10)

## 2022-05-19 LAB — MAGNESIUM: Magnesium: 2.2 mg/dL (ref 1.7–2.4)

## 2022-05-19 LAB — CBG MONITORING, ED: Glucose-Capillary: 101 mg/dL — ABNORMAL HIGH (ref 70–99)

## 2022-05-19 MED ORDER — ATORVASTATIN CALCIUM 40 MG PO TABS
40.0000 mg | ORAL_TABLET | Freq: Every day | ORAL | Status: DC
  Administered 2022-05-20: 40 mg via ORAL
  Filled 2022-05-19: qty 1

## 2022-05-19 MED ORDER — CLOPIDOGREL BISULFATE 75 MG PO TABS
75.0000 mg | ORAL_TABLET | Freq: Once | ORAL | Status: AC
  Administered 2022-05-19: 75 mg via ORAL
  Filled 2022-05-19: qty 1

## 2022-05-19 MED ORDER — ACETAMINOPHEN 325 MG PO TABS: 650.0000 mg | ORAL_TABLET | Freq: Four times a day (QID) | ORAL | Status: DC | PRN

## 2022-05-19 MED ORDER — HYDRALAZINE HCL 20 MG/ML IJ SOLN: 10.0000 mg | INTRAMUSCULAR | Status: DC | PRN

## 2022-05-19 MED ORDER — MELATONIN 3 MG PO TABS: 3.0000 mg | ORAL_TABLET | Freq: Every evening | ORAL | Status: DC | PRN

## 2022-05-19 MED ORDER — ASPIRIN 81 MG PO CHEW
81.0000 mg | CHEWABLE_TABLET | Freq: Every day | ORAL | Status: DC
  Administered 2022-05-20: 81 mg via ORAL
  Filled 2022-05-19: qty 1

## 2022-05-19 MED ORDER — CLOPIDOGREL BISULFATE 75 MG PO TABS
75.0000 mg | ORAL_TABLET | Freq: Every day | ORAL | Status: DC
  Administered 2022-05-20: 75 mg via ORAL
  Filled 2022-05-19: qty 1

## 2022-05-19 MED ORDER — ACETAMINOPHEN 650 MG RE SUPP: 650.0000 mg | Freq: Four times a day (QID) | RECTAL | Status: DC | PRN

## 2022-05-19 MED ORDER — SODIUM CHLORIDE 0.9% FLUSH: 3.0000 mL | Freq: Once | INTRAVENOUS | Status: DC

## 2022-05-19 MED ORDER — STROKE: EARLY STAGES OF RECOVERY BOOK: Freq: Once | Status: DC

## 2022-05-19 MED ORDER — IOHEXOL 350 MG/ML SOLN
75.0000 mL | Freq: Once | INTRAVENOUS | Status: AC | PRN
  Administered 2022-05-19: 75 mL via INTRAVENOUS

## 2022-05-19 NOTE — ED Provider Notes (Signed)
[facility] Provider Note   CSN: [id number] Arrival date & time: 05/19/22  1702     History  Chief Complaint  Patient presents with  . Loss of Vision    Garrett Moore is a 82 y.o. male.  With PMH of CAD on aspirin daily, CKD, HLD who presents with brief episode of complete loss of vision and blackout of left eye that was painless in nature around 8:30 AM today and lasted 1 minute.  He did wake up at 4:30 AM today and had no issues prior to this episode.  He denied any flashes of light, no flashers or floaters, no curtain.  No he had no associated symptoms.  He denied any headache or pain.  He denied any slurred speech or difficulty speaking, confusion, focal weakness numbness or tingling.  He has had previous retinal detachment and cataract surgery but this did not feel similar.  He did go see his ophthalmologist Dr. Gordy Savers earlier today was seen and had multiple pictures reportedly per patient of his eye and told that this was not an eye issue.  They told him to go to the ER for concern for stroke/TIA.  HPI     Home Medications Prior to Admission medications   Medication Sig Start Date End Date Taking? Authorizing Provider  aspirin EC 81 MG tablet Take 81 mg by mouth at bedtime. Swallow whole.    [provider]  Multiple Vitamin (MULTIVITAMIN WITH MINERALS) TABS tablet Take 1 tablet by mouth daily.    [provider]  nicotine polacrilex (COMMIT) 2 MG lozenge Take 2 mg by mouth as needed for smoking cessation.    [provider]  simvastatin (ZOCOR) 80 MG tablet Take 80 mg by mouth at bedtime.    [provider]      Allergies    Quinolones and Sulfa antibiotics    Review of Systems   Review of Systems  Physical Exam Updated Vital Signs BP 136/64 (BP Location: Right Arm)   Pulse 81   Temp 98.1 F (36.7 C) (Oral)   Resp 17   Ht 6' (1.829 m)   Wt 83.9 kg   SpO2 100%   BMI 25.09 kg/m   Physical Exam Constitutional: Alert and oriented. Well appearing and in no distress. Eyes: Conjunctivae are normal. ENT      Head: Normocephalic and atraumatic. Cardiovascular: S1, S2, regular rate, equal palpable radial pulses Respiratory: Normal respiratory effort.  Gastrointestinal: Nondistended Musculoskeletal: Normal range of motion in all extremities. Neurologic: Pupils surgical but equal in size.  EOMI.  CN II through XII grossly intact.  No visual field losses.  Normal speech and language.  5 out of 5 strength bilateral upper and lower extremities.  Sensation grossly intact.  Steady ambulatory gait. Skin: Skin is warm, dry and intact. No rash noted. Psychiatric: Mood and affect are normal. Speech and behavior are normal.  ED Results / Procedures / Treatments   Labs (all labs ordered are listed, but only abnormal results are displayed) Labs Reviewed  DIFFERENTIAL - Abnormal; Notable for the following components:      Result Value   Monocytes Absolute 1.7 (*)    All other components within normal limits  COMPREHENSIVE METABOLIC PANEL - Abnormal; Notable for the following components:   Glucose, Bld 116 (*)    Creatinine, Ser 1.41 (*)    Calcium 8.6 (*)    Albumin 3.4 (*)    GFR, Estimated 50 (*)  All other components within normal limits  I-STAT CHEM 8, ED - Abnormal; Notable for the following components:   Creatinine, Ser 1.40 (*)    Glucose, Bld 112 (*)    Calcium, Ion 1.09 (*)    All other components within normal limits  CBG MONITORING, ED - Abnormal; Notable for the following components:   Glucose-Capillary 101 (*)    All other components within normal limits  PROTIME-INR  APTT  CBC  ETHANOL    EKG EKG Interpretation  Date/Time:  Tuesday May 19 2022 17:09:40 EST Ventricular Rate:  82 PR Interval:  186 QRS Duration: 82 QT Interval:  394 QTC Calculation: 460 R Axis:   60 Text Interpretation: Normal sinus rhythm Nonspecific ST abnormality Abnormal  ECG When compared with ECG of 16-Dec-2021 08:16, PREVIOUS ECG IS PRESENT Confirmed by Georgina Snell [phone]) on 05/19/2022 7:29:17 PM  Radiology CT ANGIO HEAD NECK W WO CM  Result Date: 05/19/2022 CLINICAL DATA:  Neuro deficit, acute, stroke suspected. Left eye vision loss. EXAM: CT ANGIOGRAPHY HEAD AND NECK TECHNIQUE: Multidetector CT imaging of the head and neck was performed using the standard protocol during bolus administration of intravenous contrast. Multiplanar CT image reconstructions and MIPs were obtained to evaluate the vascular anatomy. Carotid stenosis measurements (when applicable) are obtained utilizing NASCET criteria, using the distal internal carotid diameter as the denominator. RADIATION DOSE REDUCTION: This exam was performed according to the departmental dose-optimization program which includes automated exposure control, adjustment of the mA and/or kV according to patient size and/or use of iterative reconstruction technique. CONTRAST:  2m OMNIPAQUE IOHEXOL 350 MG/ML SOLN COMPARISON:  None Available. FINDINGS: CT HEAD FINDINGS Brain: There is no evidence of an acute large territory infarct, intracranial hemorrhage, mass, midline shift, or extra-axial fluid collection. A small chronic cortical infarct is noted in the high posterior left frontal lobe, and there is also a chronic lacunar infarct in the left caudate head. Hypodensities in the cerebral white matter bilaterally are nonspecific but compatible with mild chronic small vessel ischemic disease. Mild cerebral atrophy is within normal is for age. Vascular: Calcified atherosclerosis at the skull base. Skull: No acute fracture or suspicious osseous lesion. Sinuses/Orbits: Clear paranasal sinuses. Chronic appearing air cell opacification at the right mastoid tip. Bilateral cataract extraction. Right scleral buckle. Other: None. Review of the MIP images confirms the above findings CTA NECK FINDINGS Aortic arch: Standard 3 vessel  aortic arch with a moderate amount of predominantly calcified plaque. 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 Right carotid system: Patent with scattered soft and calcified plaque in the common carotid artery, calcified plaque at the carotid bifurcation, and a small amount of calcified plaque at the distal aspect of the carotid bulb and in the distal cervical ICA. No evidence of a significant stenosis or dissection. Left carotid system: Patent common carotid artery with atherosclerotic plaque at its origin resulting in less than 50% stenosis. Extensive calcified and soft plaque at the carotid bifurcation with ulcerated plaque in the carotid bulb. Occlusion of the ICA at the level of the bulb without reconstitution in the neck. Vertebral arteries: Patent with mild atherosclerotic plaque bilaterally not resulting in significant stenosis. Mild extrinsic compression of the right vertebral artery by osteophytes in the cervical spine. Skeleton: Advanced cervical spondylosis with bulky anterior spurring. Interbody ankylosis at C4-5. Other neck: No evidence of cervical lymphadenopathy or mass. Upper chest: Emphysema. Review of the MIP images confirms the above findings CTA HEAD FINDINGS Anterior circulation: There is reconstitution of  the left supraclinoid ICA by the posterior communicating artery. The intracranial right ICA is patent with diffuse atherosclerotic calcification not resulting in significant stenosis. ACAs and MCAs are patent without evidence of a proximal branch occlusion or significant proximal stenosis. No aneurysm is identified. Posterior circulation: The intracranial vertebral arteries are patent to the basilar with mild atherosclerotic irregularity but no flow limiting stenosis. Patent PICA and SCA origins are seen bilaterally. The basilar artery is widely patent. There are small left and diminutive or absent right posterior communicating arteries. Both PCAs are patent without  evidence of a significant proximal stenosis. No aneurysm is identified. Venous sinuses: Patent. Anatomic variants: None. Review of the MIP images confirms the above findings These results were called by telephone at the time of interpretation on 05/19/2022 at 8:39 pm to provider [facility] , who verbally acknowledged these results. IMPRESSION: 1. 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 2. 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 3. No evidence of an acute large territory infarct or intracranial hemorrhage. 4. 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 5.  Aortic Atherosclerosis (ICD10-I70.0). Electronically Signed   By: Logan Bores M.D.   On: 05/19/2022 20:44    Procedures Procedures   Medications Ordered in ED Medications  sodium chloride flush (NS) 0.9 % injection 3 mL (3 mLs Intravenous Not Given 05/19/22 2020)  clopidogrel (PLAVIX) tablet 75 mg (75 mg Oral Given 05/19/22 2011)  iohexol (OMNIPAQUE) 350 MG/ML injection 75 mL (75 mLs Intravenous Contrast Given 05/19/22 2015)    ED Course/ Medical Decision Making/ A&P Clinical Course as of 05/19/22 2224  Tue May 19, 2022  1916 Spoke with Dr. Lorrin Goodell of neurology who will be down to evaluate the patient.  He recommends stroke workup and admission for stroke workup.  He also recommends giving Plavix 75 mg.  Will wait on neurology to speak to patient as patient slightly reluctant to admission. [VB]  2039 Radiologist called, patient has left ICA occlusion. [VB]  2146 Dr. Lorrin Goodell has seen and evaluated the patient.  Patient is in agreement to admission.  Will need admission to hospitalist for completion of stroke workup.  Will remain on aspirin and Plavix. [VB]  2224 S/w Dr Velia Meyer regarding case, will be admitted under hospitalist care. [VB]    Clinical Course User Index [VB] Elgie Congo, MD                            Medical  Decision Making  TRES PARLETTE is a 82 y.o. male.  With PMH of CAD on aspirin daily, CKD, HLD who presents with brief episode of complete loss of vision and blackout of left eye that was painless in nature around 8:30 AM today and lasted 1 minute.   Patient symptoms seem most consistent with amaurosis fugax. Most concern for TIA/CVA.  He had no floaters, flashers, curtain or any other symptoms suggestive of ophthalmologic issues such as RD or VH.  Additionally he saw his eye doctor today who did perform eye exam per patient which was normal.  EKG sinus rhythm no A-fib.  Labs reviewed by me generally unremarkable.  Creatinine slightly elevated 1.41 however at baseline.  Glucose 116.  Patient's neurologic exam is intact on evaluation.  He is on aspirin daily.  Spoke with Dr. Lorrin Goodell of neurology who will be down to evaluate the patient.  He recommends stroke workup  and admission for stroke workup.  He also recommends giving Plavix 75 mg.  Will wait on neurology to speak to patient as patient slightly reluctant to admission.  Radiologist called, patient has left ICA occlusion. [VB] Dr. Lorrin Goodell has seen and evaluated the patient.  Patient is in agreement to admission.  Will need admission to hospitalist for completion of stroke workup.  Will remain on aspirin and Plavix. [VB]   Risk Prescription drug management. Decision regarding hospitalization.    Final Clinical Impression(s) / ED Diagnoses Final diagnoses:  Vision loss of left eye  TIA (transient ischemic attack)  ICAO (internal carotid artery occlusion), left    Rx / DC Orders ED Discharge Orders     None         Elgie Congo, MD 05/19/22 2147

## 2022-05-19 NOTE — Consult Note (Signed)
NEUROLOGY CONSULTATION NOTE   Date of service: May 19, 2022 Patient Name: Garrett Moore MRN:  [id number] DOB:  [birth year] Reason for consult: "L eye vision loss" Requesting Provider: Rhetta Mura, DO _ _ _   _ __   _ __ _ _  __ __   _ __   __ _  History of Present Illness  Garrett Moore is a 82 y.o. male with PMH significant for atrial fibrillation not on anticoagulation, history of MI, peripheral vascular disease on aspirin and Plavix, history of BPH, CAD who presents with about a 1 minute episode of complete left eye painless vision loss.  He got up at 4:30 in the morning on 05/19/2022 and was at his baseline.  He went into the sun room at around 8:30 AM in the morning and noted that his vision seemed off.  He reports that at baseline his right eye vision is very blurry.  He close his right eye and noted that his left eye was completely dark and he had no vision.  This lasted about a minute and then resolved.  He is follows with ophthalmology for prior history of cataract and right eye retinal detachment and went into see his ophthalmologist.  He was directed to the emergency department for concern for amaurosis fugax and for further stroke workup.  No prior history of strokes, denies any history of diabetes, endorses history of hypertension, hyperlipidemia, atrial fibrillation but is not on any anticoagulation.  LKW: 030 on 05/19/2022. mRS: 0 tNKASE: Not offered due to resolution of symptoms. Thrombectomy: Not offered due to resolution of symptoms. NIHSS components Score: Comment  1a Level of Conscious 0'[x]'$  1'[]'$  2'[]'$  3'[]'$      1b LOC Questions 0'[x]'$  1'[]'$  2'[]'$       1c LOC Commands 0'[x]'$  1'[]'$  2'[]'$       2 Best Gaze 0'[x]'$  1'[]'$  2'[]'$       3 Visual 0'[x]'$  1'[]'$  2'[]'$  3'[]'$      4 Facial Palsy 0'[x]'$  1'[]'$  2'[]'$  3'[]'$      5a Motor Arm - left 0'[x]'$  1'[]'$  2'[]'$  3'[]'$  4'[]'$  UN'[]'$    5b Motor Arm - Right 0'[x]'$  1'[]'$  2'[]'$  3'[]'$  4'[]'$  UN'[]'$    6a Motor Leg - Left 0'[x]'$  1'[]'$  2'[]'$  3'[]'$  4'[]'$  UN'[]'$    6b Motor Leg - Right 0'[x]'$  1'[]'$  2'[]'$  3'[]'$  4'[]'$   UN'[]'$    7 Limb Ataxia 0'[x]'$  1'[]'$  2'[]'$  3'[]'$  UN'[]'$     8 Sensory 0'[x]'$  1'[]'$  2'[]'$  UN'[]'$      9 Best Language 0'[x]'$  1'[]'$  2'[]'$  3'[]'$      10 Dysarthria 0'[x]'$  1'[]'$  2'[]'$  UN'[]'$      11 Extinct. and Inattention 0'[x]'$  1'[]'$  2'[]'$       TOTAL: 0     ROS   Constitutional Denies weight loss, fever and chills.   HEENT Denies changes in vision and hearing.   Respiratory Denies SOB and cough.   CV Denies palpitations and CP   GI Denies abdominal pain, nausea, vomiting and diarrhea.   GU Denies dysuria and urinary frequency.   MSK Denies myalgia and joint pain.   Skin Denies rash and pruritus.   Neurological Denies headache and syncope.   Psychiatric Denies recent changes in mood. Denies anxiety and depression.    Past History   Past Medical History:  Diagnosis Date   Aneurysm of infrarenal abdominal aorta ([place]) 02/08/2018   a.) TTE 02/08/2018: Ao root 38 mm, asc Ao 40 mm; b.) TTE 06/14/2020; asc Ao 28 mm; c.) CT hematuria 05/10/2020: infrarenal AAA measuring 5.9 cm; d.) s/p EVAR  07/03/2020; e.) CT A/P 10/14/2021: residual aneurysmal sac (s/p EVAR) measured 6.0 cm   Aortic atherosclerosis (HCC)    Aortic valvar stenosis 07/21/2012   a.) TTE 07/21/2012: EF 50-55%, mild AS (AVA = 1.67, MPG 9); b.) TTE 02/08/2018: EF 60-65%; mod AS (MPG 17); c.) TTE 06/14/2020: EF 60-65%, mod AS (MPG 26.8)   Atherosclerosis of native arteries of extremity with intermittent claudication (HCC)    Basal cell carcinoma, face    BPH (benign prostatic hyperplasia)    Carotid artery stenosis 05/21/2006   a.) carotid US 05/21/2006: 0-49% BICA; b.) carotid US 06/10/2020: 1-39% RICA, total occ LICA   CKD (chronic kidney disease), stage III (HCC)    COPD (chronic obstructive pulmonary disease) ([place])    Coronary artery disease 08/14/1983   a.) MI --> LHC 08/13/2016: CTO of the RCA --> PTCA performed resulting in 30% residual stenosis; aberrant takeoff of his RCA coming off somewhat high and posteriorly.   Diastolic dysfunction 0000000   a.) TTE  02/08/2018: EF 60-65%, mild MAC,  triv TR, mild-mod AS, G1DD; b.) TTE 06/14/2020: EF 60-65%, mod AS, triv TR, mild AR, G1DD   Diverticulosis    Family history of adverse reaction to anesthesia    a.) extended GA course for mother --> postoperaitve memory problems/dementia   GERD (gastroesophageal reflux disease)    Glaucoma    Heart murmur    History of atrial fibrillation    a.) suspect in setting of admission for MI; recent ECGs reveal NSR   History of kidney stones    Hyperlipidemia    Iliac artery aneurysm, bilateral ([place]) 07/03/2020   a.) s/p insertion of BILATERAL iliac artery stents   Myocardial infarction ([place]) 08/13/2016   a.) MI --> LHC 08/13/2016: CTO of the RCA --> PTCA performed resulting in 30% residual stenosis. Procedure complicated by procedural nausea, Patient went into IVR and then AV disaccociated rhythm. SBP dropped (80s). TVP floated with capture, which stabilized patient. ICU admission overnight.   OA (osteoarthritis)    Peripheral vascular disease (HCC)    Postoperative deep vein thrombosis (DVT) ([place])    a.) postop age 77 following tonsillectomy (per patient report)   Past Surgical History:  Procedure Laterality Date   CATARACT EXTRACTION, BILATERAL  01/2019   CORONARY ANGIOPLASTY  08/14/1983   Procedure: CORONARY ANGIOPLASTY (PTCA pRCA); Location: [facility]; Surgeon: Al Little, MD   CYSTOSCOPY W/ RETROGRADES  08/12/2020   Procedure: CYSTOSCOPY WITH RETROGRADE PYELOGRAM;  Surgeon: Hollice Espy, MD;  Location: [facility];  Service: Urology;;   CYSTOSCOPY WITH INSERTION OF UROLIFT N/A 12/22/2021   Procedure: CYSTOSCOPY WITH INSERTION OF UROLIFT;  Surgeon: Hollice Espy, MD;  Location: [facility];  Service: Urology;  Laterality: N/A;   CYSTOSCOPY/URETEROSCOPY/HOLMIUM LASER/STENT PLACEMENT Left 08/12/2020   Procedure: CYSTOSCOPY/URETEROSCOPY/HOLMIUM LASER/STENT PLACEMENT;  Surgeon: Hollice Espy, MD;  Location: [facility];  Service: Urology;  Laterality: Left;    ENDOVASCULAR REPAIR/STENT GRAFT N/A 07/03/2020   Procedure: ENDOVASCULAR REPAIR/STENT GRAFT;  Surgeon: Katha Cabal, MD;  Location: [facility];  Service: Cardiovascular;  Laterality: N/A;   PELVIC ANGIOGRAPHY Right 10/14/2021   Procedure: PELVIC ANGIOGRAPHY;  Surgeon: Katha Cabal, MD;  Location: [facility];  Service: Cardiovascular;  Laterality: Right;   PERIPHERAL VASCULAR THROMBECTOMY Right 10/14/2021   Procedure: PERIPHERAL VASCULAR THROMBECTOMY;  Surgeon: Katha Cabal, MD;  Location: [facility];  Service: Cardiovascular;  Laterality: Right;   ROTATOR CUFF REPAIR Left    TEMPORARY PACEMAKER  08/14/1983  Procedure: TEMPORARY PACEMAKER PLACEMENT; Location: [facility]; Surgeon: Al Little, MD   TONSILLECTOMY     TOTAL HIP ARTHROPLASTY Left 02/21/2019   Procedure: TOTAL HIP ARTHROPLASTY ANTERIOR APPROACH;  Surgeon: Paralee Cancel, MD;  Location: [facility];  Service: Orthopedics;  Laterality: Left;  70 mins   VITRECTOMY Bilateral    Family History  Problem Relation Age of Onset   Heart Problems Mother    AAA (abdominal aortic aneurysm) Mother    Social History   Socioeconomic History   Marital status: Married    Spouse name: [name]   Number of children: 2   Years of education: Not on file   Highest education level: Not on file  Occupational History   Occupation: Freight forwarder of [employer]    Comment: retired  Tobacco Use   Smoking status: Former    Packs/day: 2.00    Years: 50.00    Total pack years: 100.00    Types: Cigarettes    Quit date: 2004    Years since quitting: 20.1   Smokeless tobacco: Never   Tobacco comments:    quit 17 years ago  Vaping Use   Vaping Use: Never used  Substance and Sexual Activity   Alcohol use: Not Currently   Drug use: Never   Sexual activity: Not on file  Other Topics Concern   Not on file  Social History Narrative   Patient lives with wife in his home.  He feels safe in his home.   No  stairs.    Social Determinants of Health   Financial Resource Strain: Not on file  Food Insecurity: Not on file  Transportation Needs: Not on file  Physical Activity: Not on file  Stress: Not on file  Social Connections: Not on file   Allergies  Allergen Reactions   Quinolones Other (See Comments)    Other reaction(s): Has aortic root dilatation   Sulfa Antibiotics Rash    Medications  (Not in a hospital admission)    Vitals   Vitals:   05/19/22 1713 05/19/22 1955 05/19/22 2027  BP: 114/66  136/64  Pulse: 84  81  Resp: 16  17  Temp: 98.1 F (36.7 C)  98.1 F (36.7 C)  TempSrc: Oral  Oral  SpO2: 96%  100%  Weight:  83.9 kg   Height:  6' (1.829 m)      Body mass index is 25.09 kg/m.  Physical Exam   General: Laying comfortably in bed; in no acute distress.  HENT: Normal oropharynx and mucosa. Normal external appearance of ears and nose.  Neck: Supple, no pain or tenderness  CV: No JVD. No peripheral edema.  Pulmonary: Symmetric Chest rise. Normal respiratory effort.  Abdomen: Soft to touch, non-tender. Ext: No cyanosis, edema, or deformity  Skin: No rash. Normal palpation of skin.   Musculoskeletal: Normal digits and nails by inspection. No clubbing.   Neurologic Examination  Mental status/Cognition: Alert, oriented to self, place, month and year, good attention.  Speech/language: Fluent, comprehension intact, object naming intact, repetition intact.  Cranial nerves:   CN II R pupil oblong and unreactive and appears consistent with surgical pupil. L pupil is round and reactive to light. Blurred vision in superior half of R eye.    CN III,IV,VI EOM intact, no gaze preference or deviation, no nystagmus    CN V normal sensation in V1, V2, and V3 segments bilaterally    CN VII no asymmetry, no nasolabial fold flattening    CN VIII normal hearing  to speech    CN IX & X normal palatal elevation, no uvular deviation    CN XI 5/5 head turn and 5/5 shoulder shrug  bilaterally    CN XII midline tongue protrusion    Motor:  Muscle bulk: normal, tone normal, pronator drift none tremor none Mvmt Root Nerve  Muscle Right Left Comments  SA C5/6 Ax Deltoid 5 5   EF C5/6 Mc Biceps 5 5   EE C6/7/8 Rad Triceps 5 5   WF C6/7 Med FCR     WE C7/8 PIN ECU     F Ab C8/T1 U ADM/FDI 5 5   HF L1/2/3 Fem Illopsoas 5 5   KE L2/3/4 Fem Quad 5 5   DF L4/5 D Peron Tib Ant 5 5   PF S1/2 Tibial Grc/Sol 5 5    Sensation:  Light touch Intact throughout   Pin prick    Temperature    Vibration   Proprioception    Coordination/Complex Motor:  - Finger to Nose intact bilaterally - Heel to shin intact bilaterally - Rapid alternating movement are normal - Gait: Deferred for patient's safety. Labs   CBC:  Recent Labs  Lab 05/19/22 1731  WBC 9.4  NEUTROABS 5.5  HGB 13.8  13.9  HCT 41.4  41.0  MCV 96.5  PLT [id number]    Basic Metabolic Panel:  Lab Results  Component Value Date   NA 139 05/19/2022   NA 141 05/19/2022   K 3.9 05/19/2022   K 3.9 05/19/2022   CO2 26 05/19/2022   GLUCOSE 116 (H) 05/19/2022   GLUCOSE 112 (H) 05/19/2022   BUN 15 05/19/2022   BUN 18 05/19/2022   CREATININE 1.41 (H) 05/19/2022   CREATININE 1.40 (H) 05/19/2022   CALCIUM 8.6 (L) 05/19/2022   GFRNONAA 50 (L) 05/19/2022   GFRAA >60 02/22/2019   Lipid Panel: No results found for: "LDLCALC" HgbA1c: No results found for: "HGBA1C" Urine Drug Screen: No results found for: "LABOPIA", "COCAINSCRNUR", "LABBENZ", "AMPHETMU", "THCU", "LABBARB"  Alcohol Level     Component Value Date/Time   ETH <10 05/19/2022 1731    CT Head without contrast(Personally reviewed): CTH was negative for a large hypodensity concerning for a large territory infarct or hyperdensity concerning for an ICH  CT angio Head and Neck with contrast(Personally reviewed): L ICA proximal occlusion with distal intracranial reconstitution.  MRI Brain: pending  Impression   Garrett Moore is a 82 y.o. male with  PMH significant for atrial fibrillation not on anticoagulation, history of MI, peripheral vascular disease on aspirin and Plavix, history of BPH, CAD who presents with about a 1 minute episode of complete left eye painless vision loss.  Presentation concerning for amaurosis fugax.  Etiology could be cardioembolic from the noted afibb and not on AC vs critical stenosis/occlusion of left ICA.  DAPT for now but would likely benefit from [facility] given hx of Afibb.  Recommendations   - Frequent Neuro checks per stroke unit protocol - Recommend brain imaging with MRI Brain without contrast - Recommend obtaining TTE - Recommend obtaining Lipid panel with LDL - Please start statin if LDL > 70 - Recommend HbA1c to evaluate for diabetes and how well it is controlled. - Antithrombotic - Aspirin and plavix daily for now. Will likely benefit from [facility] in the long run. Timing of AC after MRI Brain. - Recommend DVT ppx - SBP goal - permissive hypertension first 24 h < 220/110. Held home meds.  - Recommend Telemetry monitoring  for arrythmia - Recommend bedside swallow screen prior to PO intake. - Stroke education booklet - Recommend PT/OT/SLP consult - stroke team to follow along.  ______________________________________________________________________   Thank you for the opportunity to take part in the care of this patient. If you have any further questions, please contact the neurology consultation attending.  Signed,  [place] Pager Number [phone] _ _ _   _ __   _ __ _ _  __ __   _ __   __ _

## 2022-05-19 NOTE — H&P (Signed)
History and Physical      Garrett Moore [id number] DOB: 1940/12/14 DOA: 05/19/2022  PCP: [name], MD *** Patient coming from: home ***  I have personally briefly reviewed patient's old medical records in [place]  Chief Complaint: ***  HPI: Garrett Moore is a 82 y.o. male with medical history significant for *** who is admitted to [facility] on 05/19/2022 with *** after presenting from home*** to [facility] ED complaining of ***.    ***       ***   ED Course:  Vital signs in the ED were notable for the following: ***  Labs were notable for the following: ***  Per my interpretation, EKG in ED demonstrated the following:  ***  Imaging and additional notable ED work-up: ***  While in the ED, the following were administered: ***  Subsequently, the patient was admitted  ***  ***red    Review of Systems: As per HPI otherwise 10 point review of systems negative.   Past Medical History:  Diagnosis Date   Aneurysm of infrarenal abdominal aorta ([place]) 02/08/2018   a.) TTE 02/08/2018: Ao root 38 mm, asc Ao 40 mm; b.) TTE 06/14/2020; asc Ao 28 mm; c.) CT hematuria 05/10/2020: infrarenal AAA measuring 5.9 cm; d.) s/p EVAR 07/03/2020; e.) CT A/P 10/14/2021: residual aneurysmal sac (s/p EVAR) measured 6.0 cm   Aortic atherosclerosis (HCC)    Aortic valvar stenosis 07/21/2012   a.) TTE 07/21/2012: EF 50-55%, mild AS (AVA = 1.67, MPG 9); b.) TTE 02/08/2018: EF 60-65%; mod AS (MPG 17); c.) TTE 06/14/2020: EF 60-65%, mod AS (MPG 26.8)   Atherosclerosis of native arteries of extremity with intermittent claudication (HCC)    Basal cell carcinoma, face    BPH (benign prostatic hyperplasia)    Carotid artery stenosis 05/21/2006   a.) carotid US 05/21/2006: 0-49% BICA; b.) carotid US 06/10/2020: 1-39% RICA, total occ LICA   CKD (chronic kidney disease), stage III (HCC)    COPD (chronic obstructive pulmonary disease) ([place])    Coronary artery disease  08/14/1983   a.) MI --> LHC 08/13/2016: CTO of the RCA --> PTCA performed resulting in 30% residual stenosis; aberrant takeoff of his RCA coming off somewhat high and posteriorly.   Diastolic dysfunction 0000000   a.) TTE 02/08/2018: EF 60-65%, mild MAC,  triv TR, mild-mod AS, G1DD; b.) TTE 06/14/2020: EF 60-65%, mod AS, triv TR, mild AR, G1DD   Diverticulosis    Family history of adverse reaction to anesthesia    a.) extended GA course for mother --> postoperaitve memory problems/dementia   GERD (gastroesophageal reflux disease)    Glaucoma    Heart murmur    History of atrial fibrillation    a.) suspect in setting of admission for MI; recent ECGs reveal NSR   History of kidney stones    Hyperlipidemia    Iliac artery aneurysm, bilateral ([place]) 07/03/2020   a.) s/p insertion of BILATERAL iliac artery stents   Myocardial infarction ([place]) 08/13/2016   a.) MI --> LHC 08/13/2016: CTO of the RCA --> PTCA performed resulting in 30% residual stenosis. Procedure complicated by procedural nausea, Patient went into IVR and then AV disaccociated rhythm. SBP dropped (80s). TVP floated with capture, which stabilized patient. ICU admission overnight.   OA (osteoarthritis)    Peripheral vascular disease (HCC)    Postoperative deep vein thrombosis (DVT) ([place])    a.) postop age 37 following tonsillectomy (per patient report)    Past Surgical History:  Procedure Laterality Date   CATARACT EXTRACTION, BILATERAL  01/2019   CORONARY ANGIOPLASTY  08/14/1983   Procedure: CORONARY ANGIOPLASTY (PTCA pRCA); Location: [facility]; Surgeon: Al Little, MD   CYSTOSCOPY W/ RETROGRADES  08/12/2020   Procedure: CYSTOSCOPY WITH RETROGRADE PYELOGRAM;  Surgeon: Hollice Espy, MD;  Location: [facility];  Service: Urology;;   CYSTOSCOPY WITH INSERTION OF UROLIFT N/A 12/22/2021   Procedure: CYSTOSCOPY WITH INSERTION OF UROLIFT;  Surgeon: Hollice Espy, MD;  Location: [facility];  Service: Urology;  Laterality: N/A;    CYSTOSCOPY/URETEROSCOPY/HOLMIUM LASER/STENT PLACEMENT Left 08/12/2020   Procedure: CYSTOSCOPY/URETEROSCOPY/HOLMIUM LASER/STENT PLACEMENT;  Surgeon: Hollice Espy, MD;  Location: [facility];  Service: Urology;  Laterality: Left;   ENDOVASCULAR REPAIR/STENT GRAFT N/A 07/03/2020   Procedure: ENDOVASCULAR REPAIR/STENT GRAFT;  Surgeon: Katha Cabal, MD;  Location: [place] [facility];  Service: Cardiovascular;  Laterality: N/A;   PELVIC ANGIOGRAPHY Right 10/14/2021   Procedure: PELVIC ANGIOGRAPHY;  Surgeon: Katha Cabal, MD;  Location: [place] [facility];  Service: Cardiovascular;  Laterality: Right;   PERIPHERAL VASCULAR THROMBECTOMY Right 10/14/2021   Procedure: PERIPHERAL VASCULAR THROMBECTOMY;  Surgeon: Katha Cabal, MD;  Location: [place] [facility];  Service: Cardiovascular;  Laterality: Right;   ROTATOR CUFF REPAIR Left    TEMPORARY PACEMAKER  08/14/1983   Procedure: TEMPORARY PACEMAKER PLACEMENT; Location: [facility]; Surgeon: Al Little, MD   TONSILLECTOMY     TOTAL HIP ARTHROPLASTY Left 02/21/2019   Procedure: TOTAL HIP ARTHROPLASTY ANTERIOR APPROACH;  Surgeon: Paralee Cancel, MD;  Location: [facility];  Service: Orthopedics;  Laterality: Left;  70 mins   VITRECTOMY Bilateral     Social History:  reports that he quit smoking about 20 years ago. His smoking use included cigarettes. He has a 100.00 pack-year smoking history. He has never used smokeless tobacco. He reports that he does not currently use alcohol. He reports that he does not use drugs.   Allergies  Allergen Reactions   Quinolones Other (See Comments)    Other reaction(s): Has aortic root dilatation   Sulfa Antibiotics Rash    Family History  Problem Relation Age of Onset   Heart Problems Mother    AAA (abdominal aortic aneurysm) Mother     Family history reviewed and not pertinent ***   Prior to Admission medications   Medication Sig Start Date End Date Taking? Authorizing Provider  aspirin EC  81 MG tablet Take 81 mg by mouth at bedtime. Swallow whole.    [provider]  Multiple Vitamin (MULTIVITAMIN WITH MINERALS) TABS tablet Take 1 tablet by mouth daily.    [provider]  nicotine polacrilex (COMMIT) 2 MG lozenge Take 2 mg by mouth as needed for smoking cessation.    [provider]  simvastatin (ZOCOR) 80 MG tablet Take 80 mg by mouth at bedtime.    [provider]     Objective    Physical Exam: Vitals:   05/19/22 1713 05/19/22 1955 05/19/22 2027  BP: 114/66  136/64  Pulse: 84  81  Resp: 16  17  Temp: 98.1 F (36.7 C)  98.1 F (36.7 C)  TempSrc: Oral  Oral  SpO2: 96%  100%  Weight:  83.9 kg   Height:  6' (1.829 m)     General: appears to be stated age; alert, oriented Skin: warm, dry, no rash Head:  AT/[place] Mouth:  Oral mucosa membranes appear moist, normal dentition Neck: supple; trachea midline Heart:  RRR; did not appreciate any M/R/G Lungs: CTAB, did  not appreciate any wheezes, rales, or rhonchi Abdomen: + BS; soft, ND, NT Vascular: 2+ pedal pulses b/l; 2+ radial pulses b/l Extremities: no peripheral edema, no muscle wasting Neuro: strength and sensation intact in upper and lower extremities b/l ***   *** Neuro: 5/5 strength of the proximal and distal flexors and extensors of the upper and lower extremities bilaterally; sensation intact in upper and lower extremities b/l; cranial nerves II through XII grossly intact; no pronator drift; no evidence suggestive of slurred speech, dysarthria, or facial droop; Normal muscle tone. No tremors.  *** Neuro: In the setting of the patient's current mental status and associated inability to follow instructions, unable to perform full neurologic exam at this time.  As such, assessment of strength, sensation, and cranial nerves is limited at this time. Patient noted to spontaneously move all 4 extremities. No tremors.  ***    Labs on Admission: I have personally reviewed  following labs and imaging studies  CBC: Recent Labs  Lab 05/19/22 1731  WBC 9.4  NEUTROABS 5.5  HGB 13.8  13.9  HCT 41.4  41.0  MCV 96.5  PLT [id number]   Basic Metabolic Panel: Recent Labs  Lab 05/19/22 1731  NA 139  141  K 3.9  3.9  CL 105  106  CO2 26  GLUCOSE 116*  112*  BUN 15  18  CREATININE 1.41*  1.40*  CALCIUM 8.6*   GFR: Estimated Creatinine Clearance: 45.1 mL/min (A) (by C-G formula based on SCr of 1.41 mg/dL (H)). Liver Function Tests: Recent Labs  Lab 05/19/22 1731  AST 28  ALT 23  ALKPHOS 77  BILITOT 0.5  PROT 7.2  ALBUMIN 3.4*   No results for input(s): "LIPASE", "AMYLASE" in the last 168 hours. No results for input(s): "AMMONIA" in the last 168 hours. Coagulation Profile: Recent Labs  Lab 05/19/22 1731  INR 1.1   Cardiac Enzymes: No results for input(s): "CKTOTAL", "CKMB", "CKMBINDEX", "TROPONINI" in the last 168 hours. BNP (last 3 results) No results for input(s): "PROBNP" in the last 8760 hours. HbA1C: No results for input(s): "HGBA1C" in the last 72 hours. CBG: Recent Labs  Lab 05/19/22 2057  GLUCAP 101*   Lipid Profile: No results for input(s): "CHOL", "HDL", "LDLCALC", "TRIG", "CHOLHDL", "LDLDIRECT" in the last 72 hours. Thyroid Function Tests: No results for input(s): "TSH", "T4TOTAL", "FREET4", "T3FREE", "THYROIDAB" in the last 72 hours. Anemia Panel: No results for input(s): "VITAMINB12", "FOLATE", "FERRITIN", "TIBC", "IRON", "RETICCTPCT" in the last 72 hours. Urine analysis:    Component Value Date/Time   COLORURINE YELLOW (A) 08/28/2020 1801   APPEARANCEUR Clear 11/05/2021 1346   LABSPEC 1.015 08/28/2020 1801   PHURINE 5.0 08/28/2020 1801   GLUCOSEU Negative 11/05/2021 1346   HGBUR MODERATE (A) 08/28/2020 1801   BILIRUBINUR Negative 11/05/2021 1346   KETONESUR NEGATIVE 08/28/2020 1801   PROTEINUR Negative 11/05/2021 1346   PROTEINUR 100 (A) 08/28/2020 1801   NITRITE Negative 11/05/2021 1346   NITRITE POSITIVE  (A) 08/28/2020 1801   LEUKOCYTESUR Negative 11/05/2021 1346   LEUKOCYTESUR LARGE (A) 08/28/2020 1801    Radiological Exams on Admission: CT ANGIO HEAD NECK W WO CM  Result Date: 05/19/2022 CLINICAL DATA:  Neuro deficit, acute, stroke suspected. Left eye vision loss. EXAM: CT ANGIOGRAPHY HEAD AND NECK TECHNIQUE: Multidetector CT imaging of the head and neck was performed using the standard protocol during bolus administration of intravenous contrast. Multiplanar CT image reconstructions and MIPs were obtained to evaluate the vascular anatomy. Carotid stenosis measurements (when applicable)  are obtained utilizing NASCET criteria, using the distal internal carotid diameter as the denominator. RADIATION DOSE REDUCTION: This exam was performed according to the departmental dose-optimization program which includes automated exposure control, adjustment of the mA and/or kV according to patient size and/or use of iterative reconstruction technique. CONTRAST:  93m OMNIPAQUE IOHEXOL 350 MG/ML SOLN COMPARISON:  None Available. FINDINGS: CT HEAD FINDINGS Brain: There is no evidence of an acute large territory infarct, intracranial hemorrhage, mass, midline shift, or extra-axial fluid collection. A small chronic cortical infarct is noted in the high posterior left frontal lobe, and there is also a chronic lacunar infarct in the left caudate head. Hypodensities in the cerebral white matter bilaterally are nonspecific but compatible with mild chronic small vessel ischemic disease. Mild cerebral atrophy is within normal is for age. Vascular: Calcified atherosclerosis at the skull base. Skull: No acute fracture or suspicious osseous lesion. Sinuses/Orbits: Clear paranasal sinuses. Chronic appearing air cell opacification at the right mastoid tip. Bilateral cataract extraction. Right scleral buckle. Other: None. Review of the MIP images confirms the above findings CTA NECK FINDINGS Aortic arch: Standard 3 vessel aortic arch  with a moderate amount of predominantly calcified plaque. 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 Right carotid system: Patent with scattered soft and calcified plaque in the common carotid artery, calcified plaque at the carotid bifurcation, and a small amount of calcified plaque at the distal aspect of the carotid bulb and in the distal cervical ICA. No evidence of a significant stenosis or dissection. Left carotid system: Patent common carotid artery with atherosclerotic plaque at its origin resulting in less than 50% stenosis. Extensive calcified and soft plaque at the carotid bifurcation with ulcerated plaque in the carotid bulb. Occlusion of the ICA at the level of the bulb without reconstitution in the neck. Vertebral arteries: Patent with mild atherosclerotic plaque bilaterally not resulting in significant stenosis. Mild extrinsic compression of the right vertebral artery by osteophytes in the cervical spine. Skeleton: Advanced cervical spondylosis with bulky anterior spurring. Interbody ankylosis at C4-5. Other neck: No evidence of cervical lymphadenopathy or mass. Upper chest: Emphysema. Review of the MIP images confirms the above findings CTA HEAD FINDINGS Anterior circulation: There is reconstitution of the left supraclinoid ICA by the posterior communicating artery. The intracranial right ICA is patent with diffuse atherosclerotic calcification not resulting in significant stenosis. ACAs and MCAs are patent without evidence of a proximal branch occlusion or significant proximal stenosis. No aneurysm is identified. Posterior circulation: The intracranial vertebral arteries are patent to the basilar with mild atherosclerotic irregularity but no flow limiting stenosis. Patent PICA and SCA origins are seen bilaterally. The basilar artery is widely patent. There are small left and diminutive or absent right posterior communicating arteries. Both PCAs are patent without evidence of  a significant proximal stenosis. No aneurysm is identified. Venous sinuses: Patent. Anatomic variants: None. Review of the MIP images confirms the above findings These results were called by telephone at the time of interpretation on 05/19/2022 at 8:39 pm to [facility], who verbally acknowledged these results. IMPRESSION: 1. 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 2. 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 3. No evidence of an acute large territory infarct or intracranial hemorrhage. 4. 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 5.  Aortic Atherosclerosis (ICD10-I70.0). Electronically Signed   By: ALogan BoresM.D.   On: 05/19/2022 20:44  Assessment/Plan   Principal Problem:   Acute ischemic stroke (HCC)   ***       ***            ***             ***            ***            ***            ***             ***           ***           ***           ***           ***          ***          ***  DVT prophylaxis: SCD's ***  Code Status: Full code*** Family Communication: none*** Disposition Plan: Per Rounding Team Consults called: none***;  Admission status: ***     I SPENT GREATER THAN 75 *** MINUTES IN CLINICAL CARE TIME/MEDICAL DECISION-MAKING IN COMPLETING THIS ADMISSION.      [place] DO [facility]  From [place]   05/19/2022, 10:38 PM   ***

## 2022-05-19 NOTE — ED Triage Notes (Signed)
Pt reports one minute of loss of vision in left eye this morning. Went to eye doctor who gave good report and sent here for stroke/TIA workup. Pt denies any other neurological symptoms at the time of loss of vision.

## 2022-05-19 NOTE — ED Provider Triage Note (Signed)
Emergency Medicine Provider Triage Evaluation Note  Garrett Moore , a 82 y.o. male  was evaluated in triage.  Patient coming from his eye doctor due to the concern of a potential stroke.  He reports that earlier this morning he had an episode of vision loss in his left eye.  Has a history of cataracts and bilateral retinal detachments.  This is why he was concerned enough to go to his eye doctor.  His full exam they are was within normal limits so he was sent here.  Denies any numbness, tingling, facial droop, aphasia, blurred vision or any other symptoms at this time.  Said that it lasted no longer than a minute. Review of Systems  Positive:  Negative:   Physical Exam  BP 114/66 (BP Location: Right Arm)   Pulse 84   Temp 98.1 F (36.7 C) (Oral)   Resp 16   SpO2 96%  Gen:   Awake, no distress   Resp:  Normal effort  MSK:   Moves extremities without difficulty  Other:  EOMs intact without pain.  Pupils are equal however not reactive to light, may be his baseline secondary to surgeries.  Does not appear to have a visual field disturbance  Medical Decision Making  Medically screening exam initiated at 5:14 PM.  Appropriate orders placed.  Garrett Moore was informed that the remainder of the evaluation will be completed by another provider, this initial triage assessment does not replace that evaluation, and the importance of remaining in the ED until their evaluation is complete.     Rhae Hammock, PA-C 05/19/22 1715

## 2022-05-20 ENCOUNTER — Encounter (HOSPITAL_COMMUNITY): Payer: Medicare HMO

## 2022-05-20 ENCOUNTER — Observation Stay (HOSPITAL_COMMUNITY): Payer: Medicare HMO

## 2022-05-20 ENCOUNTER — Observation Stay (HOSPITAL_BASED_OUTPATIENT_CLINIC_OR_DEPARTMENT_OTHER): Payer: Medicare HMO

## 2022-05-20 ENCOUNTER — Other Ambulatory Visit (HOSPITAL_COMMUNITY): Payer: Medicare HMO

## 2022-05-20 DIAGNOSIS — I5032 Chronic diastolic (congestive) heart failure: Secondary | ICD-10-CM | POA: Diagnosis present

## 2022-05-20 DIAGNOSIS — H5462 Unqualified visual loss, left eye, normal vision right eye: Secondary | ICD-10-CM

## 2022-05-20 DIAGNOSIS — G459 Transient cerebral ischemic attack, unspecified: Secondary | ICD-10-CM

## 2022-05-20 DIAGNOSIS — I350 Nonrheumatic aortic (valve) stenosis: Secondary | ICD-10-CM

## 2022-05-20 DIAGNOSIS — I639 Cerebral infarction, unspecified: Secondary | ICD-10-CM | POA: Diagnosis not present

## 2022-05-20 DIAGNOSIS — G319 Degenerative disease of nervous system, unspecified: Secondary | ICD-10-CM | POA: Diagnosis not present

## 2022-05-20 DIAGNOSIS — I480 Paroxysmal atrial fibrillation: Secondary | ICD-10-CM | POA: Diagnosis present

## 2022-05-20 LAB — LIPID PANEL
Cholesterol: 115 mg/dL (ref 0–200)
HDL: 36 mg/dL — ABNORMAL LOW (ref >40)
LDL Cholesterol: 60 mg/dL (ref 0–99)
Total CHOL/HDL Ratio: 3.2 RATIO
Triglycerides: 96 mg/dL (ref <150)
VLDL: 19 mg/dL (ref 0–40)

## 2022-05-20 LAB — CBC WITH DIFFERENTIAL/PLATELET
Abs Immature Granulocytes: 0.04 10*3/uL (ref 0.00–0.07)
Basophils Absolute: 0 10*3/uL (ref 0.0–0.1)
Basophils Relative: 0 %
Eosinophils Absolute: 0.4 10*3/uL (ref 0.0–0.5)
Eosinophils Relative: 4 %
HCT: 39.2 % (ref 39.0–52.0)
Hemoglobin: 12.6 g/dL — ABNORMAL LOW (ref 13.0–17.0)
Immature Granulocytes: 0 %
Lymphocytes Relative: 20 %
Lymphs Abs: 1.8 10*3/uL (ref 0.7–4.0)
MCH: 31 pg (ref 26.0–34.0)
MCHC: 32.1 g/dL (ref 30.0–36.0)
MCV: 96.6 fL (ref 80.0–100.0)
Monocytes Absolute: 1.7 10*3/uL — ABNORMAL HIGH (ref 0.1–1.0)
Monocytes Relative: 19 %
Neutro Abs: 5.1 10*3/uL (ref 1.7–7.7)
Neutrophils Relative %: 57 %
Platelets: 262 10*3/uL (ref 150–400)
RBC: 4.06 MIL/uL — ABNORMAL LOW (ref 4.22–5.81)
RDW: 12.8 % (ref 11.5–15.5)
WBC: 9 10*3/uL (ref 4.0–10.5)
nRBC: 0 % (ref 0.0–0.2)

## 2022-05-20 LAB — COMPREHENSIVE METABOLIC PANEL
ALT: 22 U/L (ref 0–44)
AST: 28 U/L (ref 15–41)
Albumin: 3 g/dL — ABNORMAL LOW (ref 3.5–5.0)
Alkaline Phosphatase: 67 U/L (ref 38–126)
Anion gap: 8 (ref 5–15)
BUN: 13 mg/dL (ref 8–23)
CO2: 23 mmol/L (ref 22–32)
Calcium: 8.4 mg/dL — ABNORMAL LOW (ref 8.9–10.3)
Chloride: 105 mmol/L (ref 98–111)
Creatinine, Ser: 1.36 mg/dL — ABNORMAL HIGH (ref 0.61–1.24)
GFR, Estimated: 52 mL/min — ABNORMAL LOW (ref >60)
Glucose, Bld: 95 mg/dL (ref 70–99)
Potassium: 3.9 mmol/L (ref 3.5–5.1)
Sodium: 136 mmol/L (ref 135–145)
Total Bilirubin: 0.7 mg/dL (ref 0.3–1.2)
Total Protein: 6.3 g/dL — ABNORMAL LOW (ref 6.5–8.1)

## 2022-05-20 LAB — MAGNESIUM: Magnesium: 2.1 mg/dL (ref 1.7–2.4)

## 2022-05-20 LAB — RAPID URINE DRUG SCREEN, HOSP PERFORMED
Amphetamines: NOT DETECTED
Barbiturates: NOT DETECTED
Benzodiazepines: NOT DETECTED
Cocaine: NOT DETECTED
Opiates: NOT DETECTED
Tetrahydrocannabinol: NOT DETECTED

## 2022-05-20 MED ORDER — ALBUTEROL SULFATE (2.5 MG/3ML) 0.083% IN NEBU: 2.5000 mg | INHALATION_SOLUTION | RESPIRATORY_TRACT | Status: DC | PRN

## 2022-05-20 NOTE — Progress Notes (Signed)
Physical Therapy Evaluation Patient Details Name: Garrett Moore MRN: [id number] DOB: 1940/11/27 Today's Date: 05/20/2022  History of Present Illness  82 yo male with onset of L eye vision loss was admitted on 2/27 for testing.  Pt has no findings on MRI of brain, has new finding of L ICA occlusion.  PMHx:  CHF, HLD, PAF, smoker, CKD3, COPD, diverticulosis, MI, OA, PVD, DVT, glaucoma, heart murmur, kidney stones, skin CA, atherosclerosis, aneurysm of infrarenal abdominal aorta  Clinical Impression  Pt was seen for mobility on hallway and to look at high level balance skills with Berg test.  Pt is having some specific issues of listing back and to L side but per pt and family was previously an issue.  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  Pt and family initially asked about Home health tx, but PT feels he may not stay in therapy for long at this level of care.  Family agreed to consider this recommendation, and will work as pt stay permits on therapy for gait quality, balance and for safety education.  Family is involved and will observe and assist as needed.       Recommendations for follow up therapy are one component of a multi-disciplinary discharge planning process, led by the attending physician.  Recommendations may be updated based on patient status, additional functional criteria and insurance authorization.  Follow Up Recommendations Outpatient PT      Assistance Recommended at Discharge Set up Supervision/Assistance  Patient can return home with the following  A little help with walking and/or transfers;Assist for transportation;Help with stairs or ramp for entrance    Equipment Recommendations None recommended by PT  Recommendations for Other Services       Functional Status Assessment Patient has had a recent decline in their functional status and demonstrates the ability to  make significant improvements in function in a reasonable and predictable amount of time.     Precautions / Restrictions Precautions Precautions: Fall Precaution Comments: tends to lose balance posteriorly or to L side Restrictions Weight Bearing Restrictions: No      Mobility  Bed Mobility               General bed mobility comments: up in chair when PT arrived    Transfers Overall transfer level: Needs assistance Equipment used: None Transfers: Sit to/from Stand Sit to Stand: Min guard           General transfer comment: had mild LOB upon standing to shift to L side, but caught himself    Ambulation/Gait Ambulation/Gait assistance: Min guard Gait Distance (Feet): 110 Feet Assistive device: 1 person hand held assist Gait Pattern/deviations: Step-through pattern, Decreased stride length, Wide base of support Gait velocity: reduced, variable Gait velocity interpretation: <1.31 ft/sec, indicative of household ambulator Pre-gait activities: standing balance ck General Gait Details: pt tends to shift posteriorly and to L side but can catch himself and stops very suddenly  Science writer    Modified Rankin (Stroke Patients Only)       Balance Overall balance assessment: Needs assistance Sitting-balance support: Feet supported Sitting balance-Leahy Scale: Good     Standing balance support: No upper extremity supported Standing balance-Leahy Scale: Fair Standing balance comment: pt titrates his walking speed to manage balance  Standardized Balance Assessment Standardized Balance Assessment : Berg Balance Test Berg Balance Test Sit to Stand: Able to stand  independently using hands Standing Unsupported: Able to stand safely 2 minutes Sitting with Back Unsupported but Feet Supported on Floor or Stool: Able to sit safely and securely 2 minutes Stand to Sit: Sits safely with minimal use of hands Transfers:  Able to transfer safely, minor use of hands Standing Unsupported with Eyes Closed: Able to stand 10 seconds safely Standing Ubsupported with Feet Together: Able to place feet together independently but unable to hold for 30 seconds From Standing, Reach Forward with Outstretched Arm: Can reach forward >12 cm safely (5") From Standing Position, Pick up Object from Floor: Able to pick up shoe safely and easily From Standing Position, Turn to Look Behind Over each Shoulder: Looks behind one side only/other side shows less weight shift Turn 360 Degrees: Needs close supervision or verbal cueing Standing Unsupported, Alternately Place Feet on Step/Stool: Needs assistance to keep from falling or unable to try Standing Unsupported, One Foot in Front: Loses balance while stepping or standing Standing on One Leg: Tries to lift leg/unable to hold 3 seconds but remains standing independently Total Score: 37         Pertinent Vitals/Pain Pain Assessment Pain Assessment: No/denies pain    Home Living Family/patient expects to be discharged to:: Private residence Living Arrangements: Spouse/significant other Available Help at Discharge: Family;Available 24 hours/day (Pt has been caregiver for his wife) Type of Home: House         Home Layout: One level Home Equipment: None Additional Comments: Pt is not using AD, has occas LOB that he can catch per his family    Prior Function Prior Level of Function : Independent/Modified Independent             Mobility Comments: no AD used but has moments of unsteadiness per family who are present       Hand Dominance   Dominant Hand: Right    Extremity/Trunk Assessment   Upper Extremity Assessment Upper Extremity Assessment: Defer to OT evaluation    Lower Extremity Assessment Lower Extremity Assessment: Overall WFL for tasks assessed    Cervical / Trunk Assessment Cervical / Trunk Assessment: Kyphotic (mild)  Communication    Communication: No difficulties  Cognition Arousal/Alertness: Awake/alert Behavior During Therapy: Impulsive Overall Cognitive Status: Within Functional Limits for tasks assessed                                 General Comments: pt is moving fairly fast at times, but with reminders can correct his balance.  Challenged by higher level [employer] Comments General comments (skin integrity, edema, etc.): Pt was seen for ck of balance and gait, noted imbalances with high level skills but per pt and family are quite definitely his PLOF.    Exercises     Assessment/Plan    PT Assessment Patient needs continued PT services  PT Problem List Decreased activity tolerance;Decreased balance;Decreased coordination;Decreased safety awareness       PT Treatment Interventions DME instruction;Gait training;Functional mobility training;Therapeutic activities;Therapeutic exercise;Balance training;Neuromuscular re-education;Patient/family education;Stair training    PT Goals (Current goals can be found in the Care Plan section)  Acute Rehab PT Goals Patient Stated Goal: to go home today if possible PT Goal Formulation: With patient/family Time For Goal Achievement: 05/27/22 Potential to Achieve Goals: Good  Frequency Min 3X/week     Co-evaluation               AM-PAC PT "6 Clicks" Mobility  Outcome Measure Help needed turning from your back to your side while in a flat bed without using bedrails?: None Help needed moving from lying on your back to sitting on the side of a flat bed without using bedrails?: A Little Help needed moving to and from a bed to a chair (including a wheelchair)?: A Little Help needed standing up from a chair using your arms (e.g., wheelchair or bedside chair)?: A Little Help needed to walk in hospital room?: A Little Help needed climbing 3-5 steps with a railing? : A Little 6 Click Score: 19    End of Session   Activity  Tolerance: Other (comment) (balance changes limit mobility) Patient left: in chair;with call bell/phone within reach;with family/visitor present Nurse Communication: Mobility status PT Visit Diagnosis: Unsteadiness on feet (R26.81);Other abnormalities of gait and mobility (R26.89);Difficulty in walking, not elsewhere classified (R26.2)    Time: [id number] PT Time Calculation (min) (ACUTE ONLY): 21 min   Charges:   PT Evaluation $PT Eval Moderate Complexity: 1 Mod         Ramond Dial 05/20/2022, 1:42 PM  Mee Hives, PT PhD Acute Rehab Dept. Number: [place]

## 2022-05-20 NOTE — ED Notes (Signed)
ED TO INPATIENT HANDOFF REPORT  ED Nurse Name and Phone #: [phone]  S Name/Age/Gender Garrett Moore 82 y.o. male Room/Bed: 004C/004C  Code Status   Code Status: Full Code  Home/SNF/Other Home Patient oriented to: self, place, time, and situation Is this baseline? Yes   Triage Complete: Triage complete  Chief Complaint Acute ischemic stroke [facility]) [I63.9]  Triage Note Pt reports one minute of loss of vision in left eye this morning. Went to eye doctor who gave good report and sent here for stroke/TIA workup. Pt denies any other neurological symptoms at the time of loss of vision.    Allergies Allergies  Allergen Reactions   Quinolones Other (See Comments)    Other reaction(s): Has aortic root dilatation   Sulfa Antibiotics Rash    Level of Care/Admitting Diagnosis ED Disposition     ED Disposition  Admit   Condition  --   [place]: [place] [[id number]]  Level of Care: Telemetry Medical [104]  May place patient in observation at [facility] or [place] if equivalent level of care is available:: No  Covid Evaluation: Asymptomatic - no recent exposure (last 10 days) testing not required  Diagnosis: Acute ischemic stroke [facility]) [id number]  Admitting Physician: Rhetta Mura [id number]  Attending Physician: Rhetta Mura [id number]          B Medical/Surgery History Past Medical History:  Diagnosis Date   Aneurysm of infrarenal abdominal aorta ([place]) 02/08/2018   a.) TTE 02/08/2018: Ao root 38 mm, asc Ao 40 mm; b.) TTE 06/14/2020; asc Ao 28 mm; c.) CT hematuria 05/10/2020: infrarenal AAA measuring 5.9 cm; d.) s/p EVAR 07/03/2020; e.) CT A/P 10/14/2021: residual aneurysmal sac (s/p EVAR) measured 6.0 cm   Aortic atherosclerosis (HCC)    Aortic valvar stenosis 07/21/2012   a.) TTE 07/21/2012: EF 50-55%, mild AS (AVA = 1.67, MPG 9); b.) TTE 02/08/2018: EF 60-65%; mod AS (MPG 17); c.) TTE 06/14/2020: EF 60-65%, mod AS (MPG  26.8)   Atherosclerosis of native arteries of extremity with intermittent claudication (HCC)    Basal cell carcinoma, face    BPH (benign prostatic hyperplasia)    Carotid artery stenosis 05/21/2006   a.) carotid US 05/21/2006: 0-49% BICA; b.) carotid US 06/10/2020: 1-39% RICA, total occ LICA   CKD (chronic kidney disease), stage III (HCC)    COPD (chronic obstructive pulmonary disease) ([place])    Coronary artery disease 08/14/1983   a.) MI --> LHC 08/13/2016: CTO of the RCA --> PTCA performed resulting in 30% residual stenosis; aberrant takeoff of his RCA coming off somewhat high and posteriorly.   Diastolic dysfunction 0000000   a.) TTE 02/08/2018: EF 60-65%, mild MAC,  triv TR, mild-mod AS, G1DD; b.) TTE 06/14/2020: EF 60-65%, mod AS, triv TR, mild AR, G1DD   Diverticulosis    Family history of adverse reaction to anesthesia    a.) extended GA course for mother --> postoperaitve memory problems/dementia   GERD (gastroesophageal reflux disease)    Glaucoma    Heart murmur    History of atrial fibrillation    a.) suspect in setting of admission for MI; recent ECGs reveal NSR   History of kidney stones    Hyperlipidemia    Iliac artery aneurysm, bilateral ([place]) 07/03/2020   a.) s/p insertion of BILATERAL iliac artery stents   Myocardial infarction ([place]) 08/13/2016   a.) MI --> LHC 08/13/2016: CTO of the RCA --> PTCA performed resulting in 30% residual stenosis. Procedure  complicated by procedural nausea, Patient went into IVR and then AV disaccociated rhythm. SBP dropped (80s). TVP floated with capture, which stabilized patient. ICU admission overnight.   OA (osteoarthritis)    Peripheral vascular disease (HCC)    Postoperative deep vein thrombosis (DVT) ([place])    a.) postop age 43 following tonsillectomy (per patient report)   Past Surgical History:  Procedure Laterality Date   CATARACT EXTRACTION, BILATERAL  01/2019   CORONARY ANGIOPLASTY  08/14/1983   Procedure: CORONARY  ANGIOPLASTY (PTCA pRCA); [facility]: [facility]; Surgeon: Al Little, MD   CYSTOSCOPY W/ RETROGRADES  08/12/2020   Procedure: CYSTOSCOPY WITH RETROGRADE PYELOGRAM;  Surgeon: Hollice Espy, MD;  Location: [facility];  Service: Urology;;   CYSTOSCOPY WITH INSERTION OF UROLIFT N/A 12/22/2021   Procedure: CYSTOSCOPY WITH INSERTION OF UROLIFT;  Surgeon: Hollice Espy, MD;  Location: [facility];  Service: Urology;  Laterality: N/A;   CYSTOSCOPY/URETEROSCOPY/HOLMIUM LASER/STENT PLACEMENT Left 08/12/2020   Procedure: CYSTOSCOPY/URETEROSCOPY/HOLMIUM LASER/STENT PLACEMENT;  Surgeon: Hollice Espy, MD;  Location: [facility];  Service: Urology;  Laterality: Left;   ENDOVASCULAR REPAIR/STENT GRAFT N/A 07/03/2020   Procedure: ENDOVASCULAR REPAIR/STENT GRAFT;  Surgeon: Katha Cabal, MD;  Location: [place] [facility];  Service: Cardiovascular;  Laterality: N/A;   PELVIC ANGIOGRAPHY Right 10/14/2021   Procedure: PELVIC ANGIOGRAPHY;  Surgeon: Katha Cabal, MD;  Location: [place] [facility];  Service: Cardiovascular;  Laterality: Right;   PERIPHERAL VASCULAR THROMBECTOMY Right 10/14/2021   Procedure: PERIPHERAL VASCULAR THROMBECTOMY;  Surgeon: Katha Cabal, MD;  Location: [place] [facility];  Service: Cardiovascular;  Laterality: Right;   ROTATOR CUFF REPAIR Left    TEMPORARY PACEMAKER  08/14/1983   Procedure: TEMPORARY PACEMAKER PLACEMENT; [facility]; Surgeon: Al Little, MD   TONSILLECTOMY     TOTAL HIP ARTHROPLASTY Left 02/21/2019   Procedure: TOTAL HIP ARTHROPLASTY ANTERIOR APPROACH;  Surgeon: Paralee Cancel, MD;  Location: [facility];  Service: Orthopedics;  Laterality: Left;  70 mins   VITRECTOMY Bilateral      A IV Location/Drains/Wounds Patient Lines/Drains/Airways Status     Active Line/Drains/Airways     Name Placement date Placement time Site Days   Peripheral IV 05/19/22 20 G Left Antecubital 05/19/22  1720  Antecubital  1   Sheath 10/14/21 Right Arterial;Femoral  10/14/21  1307  Arterial;Femoral  218   Incision (Closed) 02/21/19 Hip Left 02/21/19  1139  -- 1184   Incision (Closed) 08/12/20 Perineum 08/12/20  1449  -- 646   Incision (Closed) 12/22/21 Penis Other (Comment) 12/22/21  1132  -- 149            Intake/Output Last 24 hours  Intake/Output Summary (Last 24 hours) at 05/20/2022 1357 Last data filed at 05/20/2022 0700 Gross per 24 hour  Intake --  Output 425 ml  Net -425 ml    Labs/Imaging Results for orders placed or performed during the hospital encounter of 05/19/22 (from the past 48 hour(s))  Protime-INR     Status: None   Collection Time: 05/19/22  5:31 PM  Result Value Ref Range   Prothrombin Time 14.0 11.4 - 15.2 seconds   INR 1.1 0.8 - 1.2    Comment: (NOTE) INR goal varies based on device and disease states. Performed at [facility], [place] [address], [place], [place] [address]   APTT     Status: None   Collection Time: 05/19/22  5:31 PM  Result Value Ref Range   aPTT 27 24 - 36 seconds  Comment: Performed at [facility], [place] [address] [place] [address]  CBC     Status: None   Collection Time: 05/19/22  5:31 PM  Result Value Ref Range   WBC 9.4 4.0 - 10.5 K/uL   RBC 4.29 4.22 - 5.81 MIL/uL   Hemoglobin 13.8 13.0 - 17.0 g/dL   HCT 41.4 39.0 - 52.0 %   MCV 96.5 80.0 - 100.0 fL   MCH 32.2 26.0 - 34.0 pg   MCHC 33.3 30.0 - 36.0 g/dL   RDW 12.8 11.5 - 15.5 %   Platelets 279 150 - 400 K/uL   nRBC 0.0 0.0 - 0.2 %    Comment: Performed at [facility], [place] [address] [place], [place] [address]  Differential     Status: Abnormal   Collection Time: 05/19/22  5:31 PM  Result Value Ref Range   Neutrophils Relative % 57 %   Neutro Abs 5.5 1.7 - 7.7 K/uL   Lymphocytes Relative 21 %   Lymphs Abs 2.0 0.7 - 4.0 K/uL   Monocytes Relative 18 %   Monocytes Absolute 1.7 (H) 0.1 - 1.0 K/uL   Eosinophils Relative 3 %   Eosinophils Absolute 0.3 0.0 - 0.5 K/uL   Basophils Relative 1 %    Basophils Absolute 0.1 0.0 - 0.1 K/uL   Immature Granulocytes 0 %   Abs Immature Granulocytes 0.03 0.00 - 0.07 K/uL    Comment: Performed at [place] [address], [place], [place] [address]  Comprehensive metabolic panel     Status: Abnormal   Collection Time: 05/19/22  5:31 PM  Result Value Ref Range   Sodium 139 135 - 145 mmol/L   Potassium 3.9 3.5 - 5.1 mmol/L   Chloride 105 98 - 111 mmol/L   CO2 26 22 - 32 mmol/L   Glucose, Bld 116 (H) 70 - 99 mg/dL    Comment: Glucose reference range applies only to samples taken after fasting for at least 8 hours.   BUN 15 8 - 23 mg/dL   Creatinine, Ser 1.41 (H) 0.61 - 1.24 mg/dL   Calcium 8.6 (L) 8.9 - 10.3 mg/dL   Total Protein 7.2 6.5 - 8.1 g/dL   Albumin 3.4 (L) 3.5 - 5.0 g/dL   AST 28 15 - 41 U/L   ALT 23 0 - 44 U/L   Alkaline Phosphatase 77 38 - 126 U/L   Total Bilirubin 0.5 0.3 - 1.2 mg/dL   GFR, Estimated 50 (L) >60 mL/min    Comment: (NOTE) Calculated using the CKD-EPI Creatinine Equation (2021)    Anion gap 8 5 - 15    Comment: Performed at [facility], [place] [address] [place], [place] [address]  Ethanol     Status: None   Collection Time: 05/19/22  5:31 PM  Result Value Ref Range   Alcohol, Ethyl (B) <10 <10 mg/dL    Comment: (NOTE) Lowest detectable limit for serum alcohol is 10 mg/dL.  For medical purposes only. Performed at [facility], [place] [address], [place], [place] [address]   I-stat chem 8, ED     Status: Abnormal   Collection Time: 05/19/22  5:31 PM  Result Value Ref Range   Sodium 141 135 - 145 mmol/L   Potassium 3.9 3.5 - 5.1 mmol/L   Chloride 106 98 - 111 mmol/L   BUN 18 8 - 23 mg/dL   Creatinine, Ser 1.40 (H) 0.61 - 1.24  mg/dL   Glucose, Bld 112 (H) 70 - 99 mg/dL    Comment: Glucose reference range applies only to samples taken after fasting for at least 8 hours.   Calcium, Ion 1.09 (L) 1.15 - 1.40 mmol/L   TCO2 25 22 - 32 mmol/L   Hemoglobin 13.9 13.0 - 17.0 g/dL   HCT 41.0  39.0 - 52.0 %  Magnesium     Status: None   Collection Time: 05/19/22  5:31 PM  Result Value Ref Range   Magnesium 2.2 1.7 - 2.4 mg/dL    Comment: Performed at [facility], [place] [address], [place], [place] [address]  CBG monitoring, ED     Status: Abnormal   Collection Time: 05/19/22  8:57 PM  Result Value Ref Range   Glucose-Capillary 101 (H) 70 - 99 mg/dL    Comment: Glucose reference range applies only to samples taken after fasting for at least 8 hours.  CBC with Differential/Platelet     Status: Abnormal   Collection Time: 05/20/22  3:57 AM  Result Value Ref Range   WBC 9.0 4.0 - 10.5 K/uL   RBC 4.06 (L) 4.22 - 5.81 MIL/uL   Hemoglobin 12.6 (L) 13.0 - 17.0 g/dL   HCT 39.2 39.0 - 52.0 %   MCV 96.6 80.0 - 100.0 fL   MCH 31.0 26.0 - 34.0 pg   MCHC 32.1 30.0 - 36.0 g/dL   RDW 12.8 11.5 - 15.5 %   Platelets 262 150 - 400 K/uL   nRBC 0.0 0.0 - 0.2 %   Neutrophils Relative % 57 %   Neutro Abs 5.1 1.7 - 7.7 K/uL   Lymphocytes Relative 20 %   Lymphs Abs 1.8 0.7 - 4.0 K/uL   Monocytes Relative 19 %   Monocytes Absolute 1.7 (H) 0.1 - 1.0 K/uL   Eosinophils Relative 4 %   Eosinophils Absolute 0.4 0.0 - 0.5 K/uL   Basophils Relative 0 %   Basophils Absolute 0.0 0.0 - 0.1 K/uL   Immature Granulocytes 0 %   Abs Immature Granulocytes 0.04 0.00 - 0.07 K/uL    Comment: Performed at [facility], [address], [place], [place] [address]  Comprehensive metabolic panel     Status: Abnormal   Collection Time: 05/20/22  3:57 AM  Result Value Ref Range   Sodium 136 135 - 145 mmol/L   Potassium 3.9 3.5 - 5.1 mmol/L   Chloride 105 98 - 111 mmol/L   CO2 23 22 - 32 mmol/L   Glucose, Bld 95 70 - 99 mg/dL    Comment: Glucose reference range applies only to samples taken after fasting for at least 8 hours.   BUN 13 8 - 23 mg/dL   Creatinine, Ser 1.36 (H) 0.61 - 1.24 mg/dL   Calcium 8.4 (L) 8.9 - 10.3 mg/dL   Total Protein 6.3 (L) 6.5 - 8.1 g/dL   Albumin 3.0 (L) 3.5 - 5.0 g/dL    AST 28 15 - 41 U/L   ALT 22 0 - 44 U/L   Alkaline Phosphatase 67 38 - 126 U/L   Total Bilirubin 0.7 0.3 - 1.2 mg/dL   GFR, Estimated 52 (L) >60 mL/min    Comment: (NOTE) Calculated using the CKD-EPI Creatinine Equation (2021)    Anion gap 8 5 - 15    Comment: Performed at [facility], [place] [address], [place], [place] [address]  Magnesium     Status: None   Collection Time: 05/20/22  3:57 AM  Result Value Ref Range   Magnesium 2.1 1.7 - 2.4 mg/dL    Comment: Performed at [facility], [place] [address] [place], [place] [address]  Lipid panel     Status: Abnormal   Collection Time: 05/20/22  3:57 AM  Result Value Ref Range   Cholesterol 115 0 - 200 mg/dL   Triglycerides 96 <150 mg/dL   HDL 36 (L) >40 mg/dL   Total CHOL/HDL Ratio 3.2 RATIO   VLDL 19 0 - 40 mg/dL   LDL Cholesterol 60 0 - 99 mg/dL    Comment:        Total Cholesterol/HDL:CHD Risk Coronary Heart Disease Risk Table                     Men   Women  1/2 Average Risk   3.4   3.3  Average Risk       5.0   4.4  2 X Average Risk   9.6   7.1  3 X Average Risk  23.4   11.0        Use the calculated Patient Ratio above and the CHD Risk Table to determine the patient's CHD Risk.        ATP III CLASSIFICATION (LDL):  <100     mg/dL   Optimal  100-129  mg/dL   Near or Above                    Optimal  130-159  mg/dL   Borderline  160-189  mg/dL   High  >190     mg/dL   Very High Performed at [place] [address], [place], [place] [address]   Rapid urine drug screen (hospital performed)     Status: None   Collection Time: 05/20/22  7:45 AM  Result Value Ref Range   Opiates NONE DETECTED NONE DETECTED   Cocaine NONE DETECTED NONE DETECTED   Benzodiazepines NONE DETECTED NONE DETECTED   Amphetamines NONE DETECTED NONE DETECTED   Tetrahydrocannabinol NONE DETECTED NONE DETECTED   Barbiturates NONE DETECTED NONE DETECTED    Comment: (NOTE) DRUG SCREEN FOR MEDICAL PURPOSES ONLY.  IF  CONFIRMATION IS NEEDED FOR ANY PURPOSE, NOTIFY LAB WITHIN 5 DAYS.  LOWEST DETECTABLE LIMITS FOR URINE DRUG SCREEN Drug Class                     Cutoff (ng/mL) Amphetamine and metabolites    1000 Barbiturate and metabolites    200 Benzodiazepine                 200 Opiates and metabolites        300 Cocaine and metabolites        300 THC                            50 Performed at [facility], [place] [address] [place], [place] [address]    VAS US CAROTID  Result Date: 05/20/2022 Carotid Arterial Duplex Study Patient Name:  Garrett Moore [facility]  Date of Exam:   05/20/2022 Medical Rec #: [id number]        Accession #:    [id number] Date of Birth: [birth year]        Patient Gender: M Patient Age:   89 years Exam Location:  [facility] Procedure:      VAS US CAROTID Referring  Phys: Babs Bertin --------------------------------------------------------------------------------  Indications:       Carotid artery disease and TIA workup, concern for amaurosis                    fugax. Risk Factors:      Hypertension, hyperlipidemia, coronary artery disease, PAD. Other Factors:     Aortic stenosis, AAA. Comparison Study:  05/19/2022 - Proximal occlusion of the left ICA with distal                    intracranial reconstitution.                    06/10/20 - Known occluded left ICA. Performing Technologist: Oda Cogan RDMS, RVT  Examination Guidelines: A complete evaluation includes B-mode imaging, spectral Doppler, color Doppler, and power Doppler as needed of all accessible portions of each vessel. Bilateral testing is considered an integral part of a complete examination. Limited examinations for reoccurring indications may be performed as noted.  Right Carotid Findings: +----------+--------+--------+--------+------------------+------------------+           PSV cm/sEDV cm/sStenosisPlaque DescriptionComments            +----------+--------+--------+--------+------------------+------------------+ CCA Prox  74      23                                                   +----------+--------+--------+--------+------------------+------------------+ CCA Mid                                             intimal thickening +----------+--------+--------+--------+------------------+------------------+ CCA Distal103     21                                intimal thickening +----------+--------+--------+--------+------------------+------------------+ ICA Prox  128     36      1-39%   irregular                            +----------+--------+--------+--------+------------------+------------------+ ICA Mid   148     40      1-39%                                        +----------+--------+--------+--------+------------------+------------------+ ICA Distal89      18                                                   +----------+--------+--------+--------+------------------+------------------+ ECA       251     24      >50%                                         +----------+--------+--------+--------+------------------+------------------+ +----------+--------+-------+----------------+-------------------+           PSV cm/sEDV cmsDescribe        Arm Pressure (  mmHG) +----------+--------+-------+----------------+-------------------+ Subclavian118            Multiphasic, WNL                    +----------+--------+-------+----------------+-------------------+ +---------+--------+--+--------+--+---------+ VertebralPSV cm/s49EDV cm/s13Antegrade +---------+--------+--+--------+--+---------+  Left Carotid Findings: +----------+--------+--------+--------+------------------+--------+           PSV cm/sEDV cm/sStenosisPlaque DescriptionComments +----------+--------+--------+--------+------------------+--------+ CCA Prox  64                                                  +----------+--------+--------+--------+------------------+--------+ CCA Distal62      14                                         +----------+--------+--------+--------+------------------+--------+ ICA Prox                  Occluded                           +----------+--------+--------+--------+------------------+--------+ ICA Mid                   Occluded                           +----------+--------+--------+--------+------------------+--------+ ICA Distal                Occluded                           +----------+--------+--------+--------+------------------+--------+ ECA       198     29                                         +----------+--------+--------+--------+------------------+--------+ +----------+--------+--------+----------------+-------------------+           PSV cm/sEDV cm/sDescribe        Arm Pressure (mmHG) +----------+--------+--------+----------------+-------------------+ [id number]             Multiphasic, WNL                    +----------+--------+--------+----------------+-------------------+ +---------+--------+--+--------+--+---------+ VertebralPSV cm/s62EDV cm/s17Antegrade +---------+--------+--+--------+--+---------+   Summary: Right Carotid: Velocities in the right ICA are consistent with a 1-39% stenosis. Left Carotid: Evidence consistent with a total occlusion of the left ICA. Vertebrals: Bilateral vertebral arteries demonstrate antegrade flow. *See table(s) above for measurements and observations.  Electronically signed by Antony Contras MD on 05/20/2022 at 12:57:48 PM.    Final    MR BRAIN WO CONTRAST  Result Date: 05/20/2022 CLINICAL DATA:  Initial evaluation for neuro deficit, stroke. EXAM: MRI HEAD WITHOUT CONTRAST TECHNIQUE: Multiplanar, multiecho pulse sequences of the brain and surrounding structures were obtained without intravenous contrast. COMPARISON:  Prior Study from 05/19/2022. FINDINGS: Brain: Cerebral  volume within normal limits for age. Patchy T2/FLAIR hyperintensity involving the periventricular deep white matter both cerebral hemispheres, consistent with chronic small vessel ischemic disease, mild in nature. Small remote cortical infarct noted at the high posterior left frontal lobe, precentral gyrus (series 11, image 24). No evidence for acute or subacute ischemia. Gray-white matter differentiation otherwise maintained. No acute or chronic intracranial blood products. No mass lesion,  midline shift or mass effect no hydrocephalus or extra-axial fluid collection. Pituitary gland and suprasellar region within normal limits. Vascular: Loss of normal flow void within the left ICA to the terminus, consistent with previously identified occlusion. Major intracranial vascular flow voids are otherwise maintained. Skull and upper cervical spine: Craniocervical junction within normal limits. Bone marrow signal intensity normal. No scalp soft tissue abnormality. Sinuses/Orbits: Prior bilateral ocular lens replacement with scleral buckling on the right. Paranasal sinuses are clear. Small right mastoid effusion noted, of doubtful significance. Other: None. IMPRESSION: 1. No acute intracranial abnormality. 2. Mild age-related cerebral atrophy with chronic small vessel ischemic disease, with small remote cortical infarct at the high posterior left frontal lobe. 3. Loss of normal flow void within the left ICA to the terminus, consistent with previously identified occlusion. Electronically Signed   By: Jeannine Boga M.D.   On: 05/20/2022 02:24   CT ANGIO HEAD NECK W WO CM  Result Date: 05/19/2022 CLINICAL DATA:  Neuro deficit, acute, stroke suspected. Left eye vision loss. EXAM: CT ANGIOGRAPHY HEAD AND NECK TECHNIQUE: Multidetector CT imaging of the head and neck was performed using the standard protocol during bolus administration of intravenous contrast. Multiplanar CT image reconstructions and MIPs were obtained  to evaluate the vascular anatomy. Carotid stenosis measurements (when applicable) are obtained utilizing NASCET criteria, using the distal internal carotid diameter as the denominator. RADIATION DOSE REDUCTION: This exam was performed according to the departmental dose-optimization program which includes automated exposure control, adjustment of the mA and/or kV according to patient size and/or use of iterative reconstruction technique. CONTRAST:  60m OMNIPAQUE IOHEXOL 350 MG/ML SOLN COMPARISON:  None Available. FINDINGS: CT HEAD FINDINGS Brain: There is no evidence of an acute large territory infarct, intracranial hemorrhage, mass, midline shift, or extra-axial fluid collection. A small chronic cortical infarct is noted in the high posterior left frontal lobe, and there is also a chronic lacunar infarct in the left caudate head. Hypodensities in the cerebral white matter bilaterally are nonspecific but compatible with mild chronic small vessel ischemic disease. Mild cerebral atrophy is within normal is for age. Vascular: Calcified atherosclerosis at the skull base. Skull: No acute fracture or suspicious osseous lesion. Sinuses/Orbits: Clear paranasal sinuses. Chronic appearing air cell opacification at the right mastoid tip. Bilateral cataract extraction. Right scleral buckle. Other: None. Review of the MIP images confirms the above findings CTA NECK FINDINGS Aortic arch: Standard 3 vessel aortic arch with a moderate amount of predominantly calcified plaque. 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 Right carotid system: Patent with scattered soft and calcified plaque in the common carotid artery, calcified plaque at the carotid bifurcation, and a small amount of calcified plaque at the distal aspect of the carotid bulb and in the distal cervical ICA. No evidence of a significant stenosis or dissection. Left carotid system: Patent common carotid artery with atherosclerotic plaque at its  origin resulting in less than 50% stenosis. Extensive calcified and soft plaque at the carotid bifurcation with ulcerated plaque in the carotid bulb. Occlusion of the ICA at the level of the bulb without reconstitution in the neck. Vertebral arteries: Patent with mild atherosclerotic plaque bilaterally not resulting in significant stenosis. Mild extrinsic compression of the right vertebral artery by osteophytes in the cervical spine. Skeleton: Advanced cervical spondylosis with bulky anterior spurring. Interbody ankylosis at C4-5. Other neck: No evidence of cervical lymphadenopathy or mass. Upper chest: Emphysema. Review of the MIP images confirms the above findings CTA HEAD FINDINGS Anterior circulation:  There is reconstitution of the left supraclinoid ICA by the posterior communicating artery. The intracranial right ICA is patent with diffuse atherosclerotic calcification not resulting in significant stenosis. ACAs and MCAs are patent without evidence of a proximal branch occlusion or significant proximal stenosis. No aneurysm is identified. Posterior circulation: The intracranial vertebral arteries are patent to the basilar with mild atherosclerotic irregularity but no flow limiting stenosis. Patent PICA and SCA origins are seen bilaterally. The basilar artery is widely patent. There are small left and diminutive or absent right posterior communicating arteries. Both PCAs are patent without evidence of a significant proximal stenosis. No aneurysm is identified. Venous sinuses: Patent. Anatomic variants: None. Review of the MIP images confirms the above findings These results were called by telephone at the time of interpretation on 05/19/2022 at 8:39 pm to provider [facility] , who verbally acknowledged these results. IMPRESSION: 1. 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 2. 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 3. No evidence of an acute large territory  infarct or intracranial hemorrhage. 4. 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 5.  Aortic Atherosclerosis (ICD10-I70.0). Electronically Signed   By: Logan Bores M.D.   On: 05/19/2022 20:44    Pending Labs Unresulted Labs (From admission, onward)     Start     Ordered   05/19/22 2236  Hemoglobin A1c  (Labs)  Add-on,   AD       Comments: To assess prior glycemic control    05/19/22 2235            Vitals/Pain Today's Vitals   05/20/22 1145 05/20/22 1200 05/20/22 1231 05/20/22 1355  BP: (!) 147/67 (!) 126/58 136/61   Pulse: 63 72 75   Resp:      Temp:   98.1 F (36.7 C)   TempSrc:   Oral   SpO2: 97% 99% 100%   Weight:      Height:      PainSc:    0-No pain    Isolation Precautions No active isolations  Medications Medications  sodium chloride flush (NS) 0.9 % injection 3 mL (3 mLs Intravenous Not Given 05/19/22 2020)  acetaminophen (TYLENOL) tablet 650 mg (has no administration in time range)    Or  acetaminophen (TYLENOL) suppository 650 mg (has no administration in time range)  melatonin tablet 3 mg (has no administration in time range)   stroke: early stages of recovery book (has no administration in time range)  hydrALAZINE (APRESOLINE) injection 10 mg (has no administration in time range)  aspirin chewable tablet 81 mg (81 mg Oral Given 05/20/22 1142)  clopidogrel (PLAVIX) tablet 75 mg (75 mg Oral Given 05/20/22 1142)  atorvastatin (LIPITOR) tablet 40 mg (40 mg Oral Given 05/20/22 1142)  albuterol (PROVENTIL) (2.5 MG/3ML) 0.083% nebulizer solution 2.5 mg (has no administration in time range)  clopidogrel (PLAVIX) tablet 75 mg (75 mg Oral Given 05/19/22 2011)  iohexol (OMNIPAQUE) 350 MG/ML injection 75 mL (75 mLs Intravenous Contrast Given 05/19/22 2015)    Mobility walks     Focused Assessments Cardiac Assessment Handoff:    No results found for: "CKTOTAL", "CKMB", "CKMBINDEX", "TROPONINI" No results  found for: "DDIMER" Does the Patient currently have chest pain? No   , Neuro Assessment Handoff:  Swallow screen pass? Yes    NIH Stroke Scale  Dizziness Present: No Headache Present: No Interval: Shift assessment Level of Consciousness (1a.)   : Alert, keenly  responsive LOC Questions (1b. )   : Answers both questions correctly LOC Commands (1c. )   : Performs both tasks correctly Best Gaze (2. )  : Normal Visual (3. )  : No visual loss Facial Palsy (4. )    : Normal symmetrical movements Motor Arm, Left (5a. )   : No drift Motor Arm, Right (5b. ) : No drift Motor Leg, Left (6a. )  : No drift Motor Leg, Right (6b. ) : No drift Limb Ataxia (7. ): Absent Sensory (8. )  : Normal, no sensory loss Best Language (9. )  : No aphasia Dysarthria (10. ): Normal Extinction/Inattention (11.)   : No Abnormality Complete NIHSS TOTAL: 0     Neuro Assessment: Within Defined Limits Neuro Checks:   Initial (05/19/22 2012)  Has TPA been given? No If patient is a Neuro Trauma and patient is going to OR before floor call report to [place] nurse: [phone] or [phone]  , Pulmonary Assessment Handoff:  Lung sounds:   O2 Device: Room Air      R Recommendations: See Admitting Provider Note  Report given to:   Additional Notes:

## 2022-05-20 NOTE — ED Notes (Signed)
Patient transported to MRI 

## 2022-05-20 NOTE — Progress Notes (Signed)
SLP Cancellation Note  Patient Details Name: Garrett Moore MRN: [id number] DOB: Mar 13, 1941   Cancelled treatment:       Reason Eval/Treat Not Completed: SLP screened, no needs identified, will sign off Pt did not present with any acute speech, language, or cognitive-linguistic deficits on admission, MRI was negative for acute, and no overt communication or cognitive-linguistic deficits were noted by occupational therapy. A formal evaluation does not appear to be clinically indicated at this time. SLP will sign off.   Garrett Moore, [place], [place] Office number [phone]  Horton Marshall 05/20/2022, 2:49 PM

## 2022-05-20 NOTE — Progress Notes (Addendum)
[facility] PROGRESS NOTE   Garrett Moore [id number] DOB: [birth year] DOA: 05/19/2022  PCP: [name], MD  Brief History/Interval Summary: 82 y.o. male with medical history significant for moderate aortic stenosis, paroxysmal atrial fibrillation chronic chronic diastolic heart failure, CKD 3A associated baseline creatinine range 1.3-1.5, hyperlipidemia, who is admitted to [facility] on 05/19/2022 with strokelike symptoms after presenting from home to [facility] ED complaining of acute left eye vision loss.  Symptoms lasted just about a minute.  He is only on aspirin prior to admission.  Hospitalize for further management.  Consultants: Neurology  Procedures: Echocardiogram is pending    Subjective/Interval History: Patient denies any complaints this morning.  He mentions that the history of atrial fibrillation was very old, dating back to perhaps the 9s.    Assessment/Plan:  Transient left visual loss/Left ICA occlusion Concerning for amaurosis fugax.  Hospitalized for further management.  Undergoing TIA workup. MRI brain without any acute findings. LDL 60.  Continue statin. Patient noted to be on aspirin and Plavix currently. HbA1c is pending. Symptoms appear to have resolved. There is mention of atrial fibrillation in his chart but this appears to be a very old history.  No atrial fibrillation has ever been documented on EKGs that are available in our system.  No mention of atrial fibrillation and cardiology notes. Will wait and see what neurology says.  May need event monitor or loop recorder but will defer to neurology. Echocardiogram is pending. PT and OT eval is pending. Carotid doppler to further evaluate left ICA.  Questionable history of atrial fibrillation See above.  EKG shows sinus rhythm.  Moderate aortic stenosis Follow-up on echocardiogram.  Followed by Dr. [name].  Chronic kidney disease stage IIIa Stable.  COPD Stable.  Chronic  diastolic CHF Follow-up on echocardiogram.  Currently euvolemic.  Hyperlipidemia Continue statin.  LDL is 60.  DVT Prophylaxis: SCDs Code Status: Full code Family Communication: Discussed with patient and his son over the phone Disposition Plan: Hopefully return home when cleared by neurology  Status is: Observation The patient remains OBS appropriate and may d/c before 2 midnights.      Medications: Scheduled:   stroke: early stages of recovery book   Does not apply Once   aspirin  81 mg Oral Daily   atorvastatin  40 mg Oral Daily   cefTRIAXone (ROCEPHIN) IM  1 g Intramuscular Q24H   clopidogrel  75 mg Oral Daily   sodium chloride flush  3 mL Intravenous Once   Continuous: [id number] **OR** acetaminophen, albuterol, hydrALAZINE, melatonin  Antibiotics: Anti-infectives (From admission, onward)    None       Objective:  Vital Signs  Vitals:   05/20/22 0915 05/20/22 0930 05/20/22 0945 05/20/22 1000  BP: 112/65 (!) 136/59 134/63 133/64  Pulse: 74 73 (!) 41 73  Resp:    18  Temp:      TempSrc:      SpO2: 100% 99% 95% 100%  Weight:      Height:        Intake/Output Summary (Last 24 hours) at 05/20/2022 1110 Last data filed at 05/20/2022 0700 Gross per 24 hour  Intake --  Output 425 ml  Net -425 ml   Filed Weights   05/19/22 1955  Weight: 83.9 kg    General appearance: Awake alert.  In no distress Resp: Clear to auscultation bilaterally.  Normal effort Cardio: S1-S2 is normal regular.  No S3-S4.  No rubs murmurs or bruit GI: Abdomen is  soft.  Nontender nondistended.  Bowel sounds are present normal.  No masses organomegaly Extremities: No edema.  Full range of motion of lower extremities. Neurologic: Alert and oriented x3.  No focal neurological deficits.    Lab Results:  Data Reviewed: I have personally reviewed following labs and reports of the imaging studies  CBC: Recent Labs  Lab 05/19/22 1731 05/20/22 0357  WBC 9.4 9.0  NEUTROABS  5.5 5.1  HGB 13.8  13.9 12.6*  HCT 41.4  41.0 39.2  MCV 96.5 96.6  PLT 279 [id number]    Basic Metabolic Panel: Recent Labs  Lab 05/19/22 1731 05/20/22 0357  NA 139  141 136  K 3.9  3.9 3.9  CL 105  106 105  CO2 26 23  GLUCOSE 116*  112* 95  BUN '15  18 13  '$ CREATININE 1.41*  1.40* 1.36*  CALCIUM 8.6* 8.4*  MG 2.2 2.1    GFR: Estimated Creatinine Clearance: 46.8 mL/min (A) (by C-G formula based on SCr of 1.36 mg/dL (H)).  Liver Function Tests: Recent Labs  Lab 05/19/22 1731 05/20/22 0357  AST 28 28  ALT 23 22  ALKPHOS 77 67  BILITOT 0.5 0.7  PROT 7.2 6.3*  ALBUMIN 3.4* 3.0*     Coagulation Profile: Recent Labs  Lab 05/19/22 1731  INR 1.1    CBG: Recent Labs  Lab 05/19/22 2057  GLUCAP 101*    Lipid Profile: Recent Labs    05/20/22 0357  CHOL 115  HDL 36*  LDLCALC 60  TRIG 96  CHOLHDL 3.2    Radiology Studies: MR BRAIN WO CONTRAST  Result Date: 05/20/2022 CLINICAL DATA:  Initial evaluation for neuro deficit, stroke. EXAM: MRI HEAD WITHOUT CONTRAST TECHNIQUE: Multiplanar, multiecho pulse sequences of the brain and surrounding structures were obtained without intravenous contrast. COMPARISON:  Prior Study from 05/19/2022. FINDINGS: Brain: Cerebral volume within normal limits for age. Patchy T2/FLAIR hyperintensity involving the periventricular deep white matter both cerebral hemispheres, consistent with chronic small vessel ischemic disease, mild in nature. Small remote cortical infarct noted at the high posterior left frontal lobe, precentral gyrus (series 11, image 24). No evidence for acute or subacute ischemia. Gray-white matter differentiation otherwise maintained. No acute or chronic intracranial blood products. No mass lesion, midline shift or mass effect no hydrocephalus or extra-axial fluid collection. Pituitary gland and suprasellar region within normal limits. Vascular: Loss of normal flow void within the left ICA to the terminus, consistent  with previously identified occlusion. Major intracranial vascular flow voids are otherwise maintained. Skull and upper cervical spine: Craniocervical junction within normal limits. Bone marrow signal intensity normal. No scalp soft tissue abnormality. Sinuses/Orbits: Prior bilateral ocular lens replacement with scleral buckling on the right. Paranasal sinuses are clear. Small right mastoid effusion noted, of doubtful significance. Other: None. IMPRESSION: 1. No acute intracranial abnormality. 2. Mild age-related cerebral atrophy with chronic small vessel ischemic disease, with small remote cortical infarct at the high posterior left frontal lobe. 3. Loss of normal flow void within the left ICA to the terminus, consistent with previously identified occlusion. Electronically Signed   By: Jeannine Boga M.D.   On: 05/20/2022 02:24   CT ANGIO HEAD NECK W WO CM  Result Date: 05/19/2022 CLINICAL DATA:  Neuro deficit, acute, stroke suspected. Left eye vision loss. EXAM: CT ANGIOGRAPHY HEAD AND NECK TECHNIQUE: Multidetector CT imaging of the head and neck was performed using the standard protocol during bolus administration of intravenous contrast. Multiplanar CT image reconstructions and MIPs were obtained  to evaluate the vascular anatomy. Carotid stenosis measurements (when applicable) are obtained utilizing NASCET criteria, using the distal internal carotid diameter as the denominator. RADIATION DOSE REDUCTION: This exam was performed according to the departmental dose-optimization program which includes automated exposure control, adjustment of the mA and/or kV according to patient size and/or use of iterative reconstruction technique. CONTRAST:  61m OMNIPAQUE IOHEXOL 350 MG/ML SOLN COMPARISON:  None Available. FINDINGS: CT HEAD FINDINGS Brain: There is no evidence of an acute large territory infarct, intracranial hemorrhage, mass, midline shift, or extra-axial fluid collection. A small chronic cortical  infarct is noted in the high posterior left frontal lobe, and there is also a chronic lacunar infarct in the left caudate head. Hypodensities in the cerebral white matter bilaterally are nonspecific but compatible with mild chronic small vessel ischemic disease. Mild cerebral atrophy is within normal is for age. Vascular: Calcified atherosclerosis at the skull base. Skull: No acute fracture or suspicious osseous lesion. Sinuses/Orbits: Clear paranasal sinuses. Chronic appearing air cell opacification at the right mastoid tip. Bilateral cataract extraction. Right scleral buckle. Other: None. Review of the MIP images confirms the above findings CTA NECK FINDINGS Aortic arch: Standard 3 vessel aortic arch with a moderate amount of predominantly calcified plaque. 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 Right carotid system: Patent with scattered soft and calcified plaque in the common carotid artery, calcified plaque at the carotid bifurcation, and a small amount of calcified plaque at the distal aspect of the carotid bulb and in the distal cervical ICA. No evidence of a significant stenosis or dissection. Left carotid system: Patent common carotid artery with atherosclerotic plaque at its origin resulting in less than 50% stenosis. Extensive calcified and soft plaque at the carotid bifurcation with ulcerated plaque in the carotid bulb. Occlusion of the ICA at the level of the bulb without reconstitution in the neck. Vertebral arteries: Patent with mild atherosclerotic plaque bilaterally not resulting in significant stenosis. Mild extrinsic compression of the right vertebral artery by osteophytes in the cervical spine. Skeleton: Advanced cervical spondylosis with bulky anterior spurring. Interbody ankylosis at C4-5. Other neck: No evidence of cervical lymphadenopathy or mass. Upper chest: Emphysema. Review of the MIP images confirms the above findings CTA HEAD FINDINGS Anterior circulation:  There is reconstitution of the left supraclinoid ICA by the posterior communicating artery. The intracranial right ICA is patent with diffuse atherosclerotic calcification not resulting in significant stenosis. ACAs and MCAs are patent without evidence of a proximal branch occlusion or significant proximal stenosis. No aneurysm is identified. Posterior circulation: The intracranial vertebral arteries are patent to the basilar with mild atherosclerotic irregularity but no flow limiting stenosis. Patent PICA and SCA origins are seen bilaterally. The basilar artery is widely patent. There are small left and diminutive or absent right posterior communicating arteries. Both PCAs are patent without evidence of a significant proximal stenosis. No aneurysm is identified. Venous sinuses: Patent. Anatomic variants: None. Review of the MIP images confirms the above findings These results were called by telephone at the time of interpretation on 05/19/2022 at 8:39 pm [facility] verbally [facility] these results. IMPRESSION: 1. 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 2. 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 3. No evidence of an acute large territory infarct or intracranial hemorrhage. 4. 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 5.  Aortic Atherosclerosis (ICD10-I70.0). Electronically Signed   By: ASeymour BarsD.  On: 05/19/2022 20:44       LOS: 0 days   [facility] Pager on [email or web address]  05/20/2022, 11:10 AM

## 2022-05-20 NOTE — Progress Notes (Addendum)
STROKE TEAM PROGRESS NOTE   INTERVAL HISTORY His son is at the bedside.  Patient is sitting in the chair. He states he had left eye vision loss that lasted for about 1 minute. He has had one other episode where he lost 1/2 of his vision in left eye and resolved in less than 1 minute. He also tells [place] he does not have a history of A fib.  He cannot figure out why it is documented in his past medical history.  States symptoms resolved and have not recurred.  CT angiogram and carotid ultrasound about confirmed chronic left ICA occlusion.  MRI brain is negative for acute stroke Recommend ASA/Plavix for 3 weeks and then Plavix alone.  Vitals:   05/19/22 2345 05/20/22 0100 05/20/22 0403 05/20/22 0600  BP: (!) 140/58 138/61 125/66 (!) 140/67  Pulse: 71 69 71 68  Resp: '17 18 18 16  '$ Temp: 98 F (36.7 C)  97.7 F (36.5 C)   TempSrc: Oral  Oral   SpO2: 98% 97% 100% 100%  Weight:      Height:       CBC:  Recent Labs  Lab 05/19/22 1731 05/20/22 0357  WBC 9.4 9.0  NEUTROABS 5.5 5.1  HGB 13.8  13.9 12.6*  HCT 41.4  41.0 39.2  MCV 96.5 96.6  PLT 279 [id number]   Basic Metabolic Panel:  Recent Labs  Lab 05/19/22 1731 05/20/22 0357  NA 139  141 136  K 3.9  3.9 3.9  CL 105  106 105  CO2 26 23  GLUCOSE 116*  112* 95  BUN '15  18 13  '$ CREATININE 1.41*  1.40* 1.36*  CALCIUM 8.6* 8.4*  MG 2.2 2.1   Lipid Panel:  Recent Labs  Lab 05/20/22 0357  CHOL 115  TRIG 96  HDL 36*  CHOLHDL 3.2  VLDL 19  LDLCALC 60   HgbA1c: No results for input(s): "HGBA1C" in the last 168 hours. Urine Drug Screen: No results for input(s): "LABOPIA", "COCAINSCRNUR", "LABBENZ", "AMPHETMU", "THCU", "LABBARB" in the last 168 hours.  Alcohol Level  Recent Labs  Lab 05/19/22 1731  ETH <10    IMAGING past 24 hours MR BRAIN WO CONTRAST  Result Date: 05/20/2022 CLINICAL DATA:  Initial evaluation for neuro deficit, stroke. EXAM: MRI HEAD WITHOUT CONTRAST TECHNIQUE: Multiplanar, multiecho pulse sequences of  the brain and surrounding structures were obtained without intravenous contrast. COMPARISON:  Prior Study from 05/19/2022. FINDINGS: Brain: Cerebral volume within normal limits for age. Patchy T2/FLAIR hyperintensity involving the periventricular deep white matter both cerebral hemispheres, consistent with chronic small vessel ischemic disease, mild in nature. Small remote cortical infarct noted at the high posterior left frontal lobe, precentral gyrus (series 11, image 24). No evidence for acute or subacute ischemia. Gray-white matter differentiation otherwise maintained. No acute or chronic intracranial blood products. No mass lesion, midline shift or mass effect no hydrocephalus or extra-axial fluid collection. Pituitary gland and suprasellar region within normal limits. Vascular: Loss of normal flow void within the left ICA to the terminus, consistent with previously identified occlusion. Major intracranial vascular flow voids are otherwise maintained. Skull and upper cervical spine: Craniocervical junction within normal limits. Bone marrow signal intensity normal. No scalp soft tissue abnormality. Sinuses/Orbits: Prior bilateral ocular lens replacement with scleral buckling on the right. Paranasal sinuses are clear. Small right mastoid effusion noted, of doubtful significance. Other: None. IMPRESSION: 1. No acute intracranial abnormality. 2. Mild age-related cerebral atrophy with chronic small vessel ischemic disease, with small remote cortical  infarct at the high posterior left frontal lobe. 3. Loss of normal flow void within the left ICA to the terminus, consistent with previously identified occlusion. Electronically Signed   By: Jeannine Boga M.D.   On: 05/20/2022 02:24   CT ANGIO HEAD NECK W WO CM  Result Date: 05/19/2022 CLINICAL DATA:  Neuro deficit, acute, stroke suspected. Left eye vision loss. EXAM: CT ANGIOGRAPHY HEAD AND NECK TECHNIQUE: Multidetector CT imaging of the head and neck was  performed using the standard protocol during bolus administration of intravenous contrast. Multiplanar CT image reconstructions and MIPs were obtained to evaluate the vascular anatomy. Carotid stenosis measurements (when applicable) are obtained utilizing NASCET criteria, using the distal internal carotid diameter as the denominator. RADIATION DOSE REDUCTION: This exam was performed according to the departmental dose-optimization program which includes automated exposure control, adjustment of the mA and/or kV according to patient size and/or use of iterative reconstruction technique. CONTRAST:  67m OMNIPAQUE IOHEXOL 350 MG/ML SOLN COMPARISON:  None Available. FINDINGS: CT HEAD FINDINGS Brain: There is no evidence of an acute large territory infarct, intracranial hemorrhage, mass, midline shift, or extra-axial fluid collection. A small chronic cortical infarct is noted in the high posterior left frontal lobe, and there is also a chronic lacunar infarct in the left caudate head. Hypodensities in the cerebral white matter bilaterally are nonspecific but compatible with mild chronic small vessel ischemic disease. Mild cerebral atrophy is within normal is for age. Vascular: Calcified atherosclerosis at the skull base. Skull: No acute fracture or suspicious osseous lesion. Sinuses/Orbits: Clear paranasal sinuses. Chronic appearing air cell opacification at the right mastoid tip. Bilateral cataract extraction. Right scleral buckle. Other: None. Review of the MIP images confirms the above findings CTA NECK FINDINGS Aortic arch: Standard 3 vessel aortic arch with a moderate amount of predominantly calcified plaque. 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 Right carotid system: Patent with scattered soft and calcified plaque in the common carotid artery, calcified plaque at the carotid bifurcation, and a small amount of calcified plaque at the distal aspect of the carotid bulb and in the distal  cervical ICA. No evidence of a significant stenosis or dissection. Left carotid system: Patent common carotid artery with atherosclerotic plaque at its origin resulting in less than 50% stenosis. Extensive calcified and soft plaque at the carotid bifurcation with ulcerated plaque in the carotid bulb. Occlusion of the ICA at the level of the bulb without reconstitution in the neck. Vertebral arteries: Patent with mild atherosclerotic plaque bilaterally not resulting in significant stenosis. Mild extrinsic compression of the right vertebral artery by osteophytes in the cervical spine. Skeleton: Advanced cervical spondylosis with bulky anterior spurring. Interbody ankylosis at C4-5. Other neck: No evidence of cervical lymphadenopathy or mass. Upper chest: Emphysema. Review of the MIP images confirms the above findings CTA HEAD FINDINGS Anterior circulation: There is reconstitution of the left supraclinoid ICA by the posterior communicating artery. The intracranial right ICA is patent with diffuse atherosclerotic calcification not resulting in significant stenosis. ACAs and MCAs are patent without evidence of a proximal branch occlusion or significant proximal stenosis. No aneurysm is identified. Posterior circulation: The intracranial vertebral arteries are patent to the basilar with mild atherosclerotic irregularity but no flow limiting stenosis. Patent PICA and SCA origins are seen bilaterally. The basilar artery is widely patent. There are small left and diminutive or absent right posterior communicating arteries. Both PCAs are patent without evidence of a significant proximal stenosis. No aneurysm is identified. Venous sinuses: Patent.  Anatomic variants: None. Review of the MIP images confirms the above findings These results were called by telephone at the time of interpretation on 05/19/2022 at 8:39 pm to provider [facility] , who verbally acknowledged these results. IMPRESSION: 1. Proximal occlusion of the  left ICA with distal intracranial reconstitution. 2. 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 3. No evidence of an acute large territory infarct or intracranial hemorrhage. 4. 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 5.  Aortic Atherosclerosis (ICD10-I70.0). Electronically Signed   By: Logan Bores M.D.   On: 05/19/2022 20:44    PHYSICAL EXAM  Temp:  [97.7 F (36.5 C)-98.1 F (36.7 C)] 97.7 F (36.5 C) (02/28 0403) Pulse Rate:  [30-96] 72 (02/28 1200) Resp:  [16-18] 18 (02/28 1000) BP: (109-147)/(58-73) 126/58 (02/28 1200) SpO2:  [87 %-100 %] 99 % (02/28 1200) Weight:  [83.9 kg] 83.9 kg (02/27 1955)  General - Well nourished, well developed, in no apparent distress. Cardiovascular - Regular rhythm and rate.  Mental Status -  Level of arousal and orientation to time, place, and person were intact. Language including expression, naming, repetition, comprehension was assessed and found intact. Attention span and concentration were normal. Recent and remote memory were intact. Fund of Knowledge was assessed and was intact.  Cranial Nerves II - XII - II - Visual field intact OU. III, IV, VI - Extraocular movements intact. V - Facial sensation intact bilaterally. VII - Facial movement intact bilaterally. VIII - Hearing & vestibular intact bilaterally. X - Palate elevates symmetrically. XI - Chin turning & shoulder shrug intact bilaterally. XII - Tongue protrusion intact.  Motor Strength - The patient's strength was normal in all extremities and pronator drift was absent.  Bulk was normal and fasciculations were absent.   Motor Tone - Muscle tone was assessed at the neck and appendages and was normal.  Sensory - Light touch, temperature/pinprick were assessed and were symmetrical.    Coordination - The patient had normal movements in the hands and feet with no ataxia or dysmetria.  Tremor was absent.  Gait and  Station - deferred.  ASSESSMENT/PLAN Mr. OMRAN NOHR is a 82 y.o. male with history of moderate aortic stenosis, chronic diastolic heart failure, CKD 3A associated baseline creatinine range 1.3-1.5, hyperlipidemia, who is admitted to [facility] on 05/19/2022 with strokelike symptoms after presenting from home to [facility] ED complaining of acute left eye vision loss.    Left eye amaurosis fugax secondary to chronic left ICA occlusion with failure of collaterals  CT head No acute abnormality.  CTA head & neck Proximal occlusion of the left ICA  MRI  no acute process  2D Echo pending  LDL 60 HgbA1c pending Patient should follow up with outpatient neurology in 8 weeks after discharge  VTE prophylaxis - SCD's    Diet   Diet regular Room service appropriate? Yes; Fluid consistency: Thin   aspirin 81 mg daily prior to admission, now on aspirin 81 mg daily and clopidogrel 75 mg daily. For 3 weeks and then Plavix  Therapy recommendations:  pending  Disposition:  pending  Hypertension Home meds:  none  Stable Permissive hypertension (OK if < 220/120) but gradually normalize in 5-7 days Long-term BP goal normotensive  Hyperlipidemia Home meds:  simvastatin '80mg'$ , resumed in hospital LDL 60, goal < 70 Continue statin at discharge  Chronic Left ICA occlusion  US carotid 05/2020: Left Carotid: Evidence consistent with a total occlusion  of the left ICA. Right Carotid: Velocities in the right ICA are consistent with a 1-39% stenosis.   Other Stroke Risk Factors Advanced Age >/= 39  Coronary artery disease Congestive heart failure   Hospital day # 0  Beulah Gandy DNP, ACNPC-AG  [facility]  I have personally obtained history,examined this patient, reviewed notes, independently viewed imaging studies, participated in medical decision making and plan of care.ROS completed by me personally and pertinent positives fully documented  I have made any additions or clarifications  directly to the above note. Agree with note above.  Patient presented with transient episode of left eye vision loss and he states that this has happened in a few times in the past but not to the same severity.  He has chronic left ICA occlusion known from previous ultrasound and this episode likely represent failure of collaterals.  Recommend aspirin and Plavix for 3 weeks followed by Plavix alone and aggressive risk factor modification.  Long discussion with patient and son at the bedside and answered questions.  Discussed with Dr. [place] Pink.  Greater than 50% time during this 50-minute visit was spent on counseling and coordination of care about his TIA and carotid occlusion and vision loss and answering questions.  Follow-up as an outpatient in the stroke clinic with nurse practitioner in 2 months  Antony Contras, [place] Pager: [phone] 05/20/2022 3:28 PM  To contact Stroke Continuity provider, please refer to [email or web address] After hours, contact General Neurology

## 2022-05-20 NOTE — Evaluation (Signed)
Occupational Therapy Evaluation Patient Details Name: Garrett Moore MRN: [id number] DOB: 05/15/1940 Today's Date: 05/20/2022   History of Present Illness 82 yo male with onset of L eye vision loss was admitted on 2/27 for testing.  Pt has no findings on MRI of brain, has new finding of L ICA occlusion.  PMHx:  CHF, HLD, PAF, smoker, CKD3, COPD, diverticulosis, MI, OA, PVD, DVT, glaucoma, heart murmur, kidney stones, skin CA, atherosclerosis, aneurysm of infrarenal abdominal aorta   Clinical Impression   PT admitted with L ICA occlusion with visual changes. Pt currently with functional limitiations due to the deficits listed below (see OT problem list). Pt at baseline drives and helps care for wife mod I. Pt during assessment noted to have some balance deficits and repetitive deficits with central vision during visual task. Pt demonstrates some problem solving with each visual task and starting to scan work for errors.  Pt will benefit from skilled OT to increase their independence and safety with adls and balance to allow discharge home with family to help with initial medication management.       Recommendations for follow up therapy are one component of a multi-disciplinary discharge planning process, led by the attending physician.  Recommendations may be updated based on patient status, additional functional criteria and insurance authorization.   Follow Up Recommendations  No OT follow up     Assistance Recommended at Discharge PRN  Patient can return home with the following      Functional Status Assessment  Patient has had a recent decline in their functional status and demonstrates the ability to make significant improvements in function in a reasonable and predictable amount of time.  Equipment Recommendations  None recommended by OT    Recommendations for Other Services       Precautions / Restrictions Precautions Precautions: Fall Precaution Comments: tends to lose  balance posteriorly or to L side Restrictions Weight Bearing Restrictions: No      Mobility Bed Mobility               General bed mobility comments: up in chair    Transfers Overall transfer level: Modified independent                 General transfer comment: use of hands required      Balance                                           ADL either performed or assessed with clinical judgement   ADL Overall ADL's : Modified independent                                       General ADL Comments: pt advised to have family (A) with medication for the first few times after this event to ensure his indep with task. pt advised to not drive in high traffic until trying environment with least distractions and open space for errors     Vision Baseline Vision/History: 1 Wears glasses Ability to See in Adequate Light: 1 Impaired Patient Visual Report: No change from baseline Vision Assessment?: Yes Eye Alignment: Within Functional Limits Alignment/Gaze Preference: Within Defined Limits Tracking/Visual Pursuits: Requires cues, head turns, or add eye shifts to track Additional Comments: pt with x3 visual assessments. All assessments demonstrate  a central vision error. pt consistently found to have same field of vision error field. Pt educated and advised on compensatory strategies     Perception     Praxis      Pertinent Vitals/Pain Pain Assessment Pain Assessment: No/denies pain     Hand Dominance Right   Extremity/Trunk Assessment Upper Extremity Assessment Upper Extremity Assessment: Overall WFL for tasks assessed   Lower Extremity Assessment Lower Extremity Assessment: Overall WFL for tasks assessed   Cervical / Trunk Assessment Cervical / Trunk Assessment: Kyphotic   Communication Communication Communication: No difficulties   Cognition Arousal/Alertness: Awake/alert Behavior During Therapy: WFL for tasks  assessed/performed Overall Cognitive Status: Within Functional Limits for tasks assessed                                       General Comments  Pt was seen for ck of balance and gait, noted imbalances with high level skills but per pt and family are quite definitely his PLOF.    Exercises     Shoulder Instructions      Home Living Family/patient expects to be discharged to:: Private residence Living Arrangements: Spouse/significant other Available Help at Discharge: Family;Available 24 hours/day Type of Home: House Home Access: Ramped entrance;Stairs to enter Entrance Stairs-Number of Steps: 2 at front/ ramp   Home Layout: One level     Bathroom Shower/Tub: Teacher, early years/pre: Standard Bathroom Accessibility: Yes   Home Equipment: None   Additional Comments: Pt is not using AD, has occas LOB that he can catch per his family      Prior Functioning/Environment Prior Level of Function : Independent/Modified Independent             Mobility Comments: no AD used but has moments of unsteadiness per family who are present 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        OT Problem List: Impaired balance (sitting and/or standing);Impaired vision/perception      OT Treatment/Interventions: Self-care/ADL training;Therapeutic exercise;Neuromuscular education;Therapeutic activities;Patient/family education;Balance training;Visual/perceptual remediation/compensation    OT Goals(Current goals can be found in the care plan section)    OT Frequency: Min 2X/week    Co-evaluation              AM-PAC OT "6 Clicks" Daily Activity     Outcome Measure Help from another person eating meals?: None Help from another person taking care of personal grooming?: None Help from another person toileting, which includes using toliet, bedpan, or urinal?: None Help from another person bathing (including washing, rinsing, drying)?: None Help from another person  to put on and taking off regular upper body clothing?: None Help from another person to put on and taking off regular lower body clothing?: None 6 Click Score: 24   End of Session Nurse Communication: Mobility status;Precautions  Activity Tolerance: Patient tolerated treatment well Patient left: in chair;with call bell/phone within reach;with family/visitor present  OT Visit Diagnosis: Unsteadiness on feet (R26.81);Low vision, both eyes (H54.2)                Time: [id number] OT Time Calculation (min): 21 min Charges:  OT General Charges $OT Visit: 1 Visit OT Evaluation $OT Eval Moderate Complexity: 1 Mod   Brynn, OTR/L  Acute Rehabilitation Services Office: [phone] .   Jeri Modena 05/20/2022, 1:53 PM

## 2022-05-20 NOTE — Discharge Summary (Signed)
[facility]  Physician Discharge Summary   Patient ID: Garrett Moore MRN: [id number] DOB/AGE: 06-27-1940 82 y.o.  Admit date: 05/19/2022 Discharge date: 05/20/2022    PCP: [name], MD  DISCHARGE DIAGNOSES:    Stage 3a chronic kidney disease (CKD) ([place])   COPD (chronic obstructive pulmonary disease) ([place])   Hyperlipidemia   Moderate aortic stenosis   Chronic diastolic CHF (congestive heart failure) (HCC)   Paroxysmal atrial fibrillation (H[facility])   Vision loss of left eye/TIA   PATIENT LEFT AGAINST MEDICAL ADVICE   INITIAL HISTORY:  82 y.o. male with medical history significant for moderate aortic stenosis, paroxysmal atrial fibrillation chronic chronic diastolic heart failure, CKD 3A associated baseline creatinine range 1.3-1.5, hyperlipidemia, who is admitted to [facility] on 05/19/2022 with strokelike symptoms after presenting from home to [facility] ED complaining of acute left eye vision loss.  Symptoms lasted just about a minute.  He is only on aspirin prior to admission.  Hospitalize for further management.    HOSPITAL COURSE:   Transient left visual loss/Left ICA occlusion Concerning for amaurosis fugax.  Hospitalized for further management.  Undergoing TIA workup. MRI brain without any acute findings. LDL 60.  Continue statin. Patient placed on aspirin and Plavix. HbA1c is 6.0. Symptoms appear to have resolved. There is mention of atrial fibrillation in his chart but this appears to be a very old history.  No atrial fibrillation has ever been documented on EKGs that are available in our system.  No mention of atrial fibrillation in cardiology notes. Echocardiogram is pending.  PT and OT eval was pending.  Carotid Doppler showed chronically occluded left ICA. Patient did not want to wait for echocardiogram.  He was told about the importance of the study in the setting of TIA.  He was adamant and then decided leave [place].   Questionable  history of atrial fibrillation See above.  EKG shows sinus rhythm.   Moderate aortic stenosis Followed by Dr. [name].   Chronic kidney disease stage IIIa Stable.   COPD Stable.   Chronic diastolic CHF Follow-up on echocardiogram.  Currently euvolemic.   Hyperlipidemia Continue statin.  LDL is 60.  PATIENT LEFT AGAINST MEDICAL ADVICE  PERTINENT LABS:  The results of significant diagnostics from this hospitalization (including imaging, microbiology, ancillary and laboratory) are listed below for reference.      Labs:   Basic Metabolic Panel: Recent Labs  Lab 05/19/22 1731 05/20/22 0357  NA 139  141 136  K 3.9  3.9 3.9  CL 105  106 105  CO2 26 23  GLUCOSE 116*  112* 95  BUN '15  18 13  '$ CREATININE 1.41*  1.40* 1.36*  CALCIUM 8.6* 8.4*  MG 2.2 2.1   Liver Function Tests: Recent Labs  Lab 05/19/22 1731 05/20/22 0357  AST 28 28  ALT 23 22  ALKPHOS 77 67  BILITOT 0.5 0.7  PROT 7.2 6.3*  ALBUMIN 3.4* 3.0*    CBC: Recent Labs  Lab 05/19/22 1731 05/20/22 0357  WBC 9.4 9.0  NEUTROABS 5.5 5.1  HGB 13.8  13.9 12.6*  HCT 41.4  41.0 39.2  MCV 96.5 96.6  PLT 279 262     CBG: Recent Labs  Lab 05/19/22 2057  GLUCAP 101*     IMAGING STUDIES VAS US CAROTID  Result Date: 05/20/2022 Carotid Arterial Duplex Study Patient Name:  Garrett Moore [facility]  Date of Exam:   05/20/2022 Medical Rec #: [id number]  Accession #:    [id number] Date of Birth: 01/20/1941        Patient Gender: M Patient Age:   82 years Exam Location:  [facility] Procedure:      VAS US CAROTID Referring Phys: Babs Bertin --------------------------------------------------------------------------------  Indications:       Carotid artery disease and TIA workup, concern for amaurosis                    fugax. Risk Factors:      Hypertension, hyperlipidemia, coronary artery disease, PAD. Other Factors:     Aortic stenosis, AAA. Comparison Study:  05/19/2022 - Proximal occlusion of  the left ICA with distal                    intracranial reconstitution.                    06/10/20 - Known occluded left ICA. Performing Technologist: Oda Cogan RDMS, RVT  Examination Guidelines: A complete evaluation includes B-mode imaging, spectral Doppler, color Doppler, and power Doppler as needed of all accessible portions of each vessel. Bilateral testing is considered an integral part of a complete examination. Limited examinations for reoccurring indications may be performed as noted.  Right Carotid Findings: +----------+--------+--------+--------+------------------+------------------+           PSV cm/sEDV cm/sStenosisPlaque DescriptionComments           +----------+--------+--------+--------+------------------+------------------+ CCA Prox  74      23                                                   +----------+--------+--------+--------+------------------+------------------+ CCA Mid                                             intimal thickening +----------+--------+--------+--------+------------------+------------------+ CCA Distal103     21                                intimal thickening +----------+--------+--------+--------+------------------+------------------+ ICA Prox  128     36      1-39%   irregular                            +----------+--------+--------+--------+------------------+------------------+ ICA Mid   148     40      1-39%                                        +----------+--------+--------+--------+------------------+------------------+ ICA Distal89      18                                                   +----------+--------+--------+--------+------------------+------------------+ ECA       251     24      >50%                                         +----------+--------+--------+--------+------------------+------------------+ +----------+--------+-------+----------------+-------------------+  PSV cm/sEDV  cmsDescribe        Arm Pressure (mmHG) +----------+--------+-------+----------------+-------------------+ Subclavian118            Multiphasic, WNL                    +----------+--------+-------+----------------+-------------------+ +---------+--------+--+--------+--+---------+ VertebralPSV cm/s49EDV cm/s13Antegrade +---------+--------+--+--------+--+---------+  Left Carotid Findings: +----------+--------+--------+--------+------------------+--------+           PSV cm/sEDV cm/sStenosisPlaque DescriptionComments +----------+--------+--------+--------+------------------+--------+ CCA Prox  64                                                 +----------+--------+--------+--------+------------------+--------+ CCA Distal62      14                                         +----------+--------+--------+--------+------------------+--------+ ICA Prox                  Occluded                           +----------+--------+--------+--------+------------------+--------+ ICA Mid                   Occluded                           +----------+--------+--------+--------+------------------+--------+ ICA Distal                Occluded                           +----------+--------+--------+--------+------------------+--------+ ECA       198     29                                         +----------+--------+--------+--------+------------------+--------+ +----------+--------+--------+----------------+-------------------+           PSV cm/sEDV cm/sDescribe        Arm Pressure (mmHG) +----------+--------+--------+----------------+-------------------+ [id number]             Multiphasic, WNL                    +----------+--------+--------+----------------+-------------------+ +---------+--------+--+--------+--+---------+ VertebralPSV cm/s62EDV cm/s17Antegrade +---------+--------+--+--------+--+---------+   Summary: Right Carotid: Velocities in the  right ICA are consistent with a 1-39% stenosis. Left Carotid: Evidence consistent with a total occlusion of the left ICA. Vertebrals: Bilateral vertebral arteries demonstrate antegrade flow. *See table(s) above for measurements and observations.  Electronically signed by Antony Contras MD on 05/20/2022 at 12:57:48 PM.    Final    MR BRAIN WO CONTRAST  Result Date: 05/20/2022 CLINICAL DATA:  Initial evaluation for neuro deficit, stroke. EXAM: MRI HEAD WITHOUT CONTRAST TECHNIQUE: Multiplanar, multiecho pulse sequences of the brain and surrounding structures were obtained without intravenous contrast. COMPARISON:  Prior Study from 05/19/2022. FINDINGS: Brain: Cerebral volume within normal limits for age. Patchy T2/FLAIR hyperintensity involving the periventricular deep white matter both cerebral hemispheres, consistent with chronic small vessel ischemic disease, mild in nature. Small remote cortical infarct noted at the high posterior left frontal lobe, precentral gyrus (series 11, image 24). No evidence for acute or subacute ischemia. Gray-white matter differentiation  otherwise maintained. No acute or chronic intracranial blood products. No mass lesion, midline shift or mass effect no hydrocephalus or extra-axial fluid collection. Pituitary gland and suprasellar region within normal limits. Vascular: Loss of normal flow void within the left ICA to the terminus, consistent with previously identified occlusion. Major intracranial vascular flow voids are otherwise maintained. Skull and upper cervical spine: Craniocervical junction within normal limits. Bone marrow signal intensity normal. No scalp soft tissue abnormality. Sinuses/Orbits: Prior bilateral ocular lens replacement with scleral buckling on the right. Paranasal sinuses are clear. Small right mastoid effusion noted, of doubtful significance. Other: None. IMPRESSION: 1. No acute intracranial abnormality. 2. Mild age-related cerebral atrophy with chronic small  vessel ischemic disease, with small remote cortical infarct at the high posterior left frontal lobe. 3. Loss of normal flow void within the left ICA to the terminus, consistent with previously identified occlusion. Electronically Signed   By: Jeannine Boga M.D.   On: 05/20/2022 02:24   CT ANGIO HEAD NECK W WO CM  Result Date: 05/19/2022 CLINICAL DATA:  Neuro deficit, acute, stroke suspected. Left eye vision loss. EXAM: CT ANGIOGRAPHY HEAD AND NECK TECHNIQUE: Multidetector CT imaging of the head and neck was performed using the standard protocol during bolus administration of intravenous contrast. Multiplanar CT image reconstructions and MIPs were obtained to evaluate the vascular anatomy. Carotid stenosis measurements (when applicable) are obtained utilizing NASCET criteria, using the distal internal carotid diameter as the denominator. RADIATION DOSE REDUCTION: This exam was performed according to the departmental dose-optimization program which includes automated exposure control, adjustment of the mA and/or kV according to patient size and/or use of iterative reconstruction technique. CONTRAST:  101m OMNIPAQUE IOHEXOL 350 MG/ML SOLN COMPARISON:  None Available. FINDINGS: CT HEAD FINDINGS Brain: There is no evidence of an acute large territory infarct, intracranial hemorrhage, mass, midline shift, or extra-axial fluid collection. A small chronic cortical infarct is noted in the high posterior left frontal lobe, and there is also a chronic lacunar infarct in the left caudate head. Hypodensities in the cerebral white matter bilaterally are nonspecific but compatible with mild chronic small vessel ischemic disease. Mild cerebral atrophy is within normal is for age. Vascular: Calcified atherosclerosis at the skull base. Skull: No acute fracture or suspicious osseous lesion. Sinuses/Orbits: Clear paranasal sinuses. Chronic appearing air cell opacification at the right mastoid tip. Bilateral cataract  extraction. Right scleral buckle. Other: None. Review of the MIP images confirms the above findings CTA NECK FINDINGS Aortic arch: Standard 3 vessel aortic arch with a moderate amount of predominantly calcified plaque. 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 Right carotid system: Patent with scattered soft and calcified plaque in the common carotid artery, calcified plaque at the carotid bifurcation, and a small amount of calcified plaque at the distal aspect of the carotid bulb and in the distal cervical ICA. No evidence of a significant stenosis or dissection. Left carotid system: Patent common carotid artery with atherosclerotic plaque at its origin resulting in less than 50% stenosis. Extensive calcified and soft plaque at the carotid bifurcation with ulcerated plaque in the carotid bulb. Occlusion of the ICA at the level of the bulb without reconstitution in the neck. Vertebral arteries: Patent with mild atherosclerotic plaque bilaterally not resulting in significant stenosis. Mild extrinsic compression of the right vertebral artery by osteophytes in the cervical spine. Skeleton: Advanced cervical spondylosis with bulky anterior spurring. Interbody ankylosis at C4-5. Other neck: No evidence of cervical lymphadenopathy or mass. Upper chest: Emphysema. Review of  the MIP images confirms the above findings CTA HEAD FINDINGS Anterior circulation: There is reconstitution of the left supraclinoid ICA by the posterior communicating artery. The intracranial right ICA is patent with diffuse atherosclerotic calcification not resulting in significant stenosis. ACAs and MCAs are patent without evidence of a proximal branch occlusion or significant proximal stenosis. No aneurysm is identified. Posterior circulation: The intracranial vertebral arteries are patent to the basilar with mild atherosclerotic irregularity but no flow limiting stenosis. Patent PICA and SCA origins are seen bilaterally.  The basilar artery is widely patent. There are small left and diminutive or absent right posterior communicating arteries. Both PCAs are patent without evidence of a significant proximal stenosis. No aneurysm is identified. Venous sinuses: Patent. Anatomic variants: None. Review of the MIP images confirms the above findings These results were called by telephone at the time of interpretation on 05/19/2022 at 8:39 pm to provider [facility] , who verbally acknowledged these results. IMPRESSION: 1. 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 2. 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 3. No evidence of an acute large territory infarct or intracranial hemorrhage. 4. 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 5.  Aortic Atherosclerosis (ICD10-I70.0). Electronically Signed   By: Logan Bores M.D.   On: 05/19/2022 20:44    PATIENT LEFT AGAINST MEDICAL ADVICE  Bonnielee Haff  [facility] Pager on [email or web address]  05/21/2022, 9:57 AM

## 2022-05-20 NOTE — Final Progress Note (Signed)
Pt argumentative when he arrived on unit states echo was in ED to perform echo mand he was brought to the unit anyway, echo called and they cannot see pt immediately Dr Vernell Barrier made aware that pt was going AMA .  AMA  signed with charge RN and pt left Dr Valora Corporal made aware

## 2022-05-21 LAB — HEMOGLOBIN A1C
Hgb A1c MFr Bld: 6 % — ABNORMAL HIGH (ref 4.8–5.6)
Mean Plasma Glucose: 126 mg/dL

## 2022-05-22 ENCOUNTER — Other Ambulatory Visit: Payer: Self-pay | Admitting: Neurology

## 2022-05-22 ENCOUNTER — Telehealth: Payer: Self-pay | Admitting: Cardiovascular Disease

## 2022-05-22 MED ORDER — CLOPIDOGREL BISULFATE 75 MG PO TABS: 75.0000 mg | ORAL_TABLET | Freq: Every day | ORAL | 0 refills | Status: DC

## 2022-05-22 NOTE — Telephone Encounter (Signed)
Pt called stating on 2/27 he was sent to [facility] by his eye doctor for further evaluations due to a brief episode of vision loss in his left eye. Pt stated he was never admitted and spent two days in the ER. He reported several test were performed, however, recommendations were to complete an ECHO and start Plavix in addition to ASA.  Pt stated he become upset due to waiting a long period of time to have ECHO completed and decided to leave AMA. He stated he felt as if he were going to have a heart attack if he stayed another night due to being upset. He report after going home and resting, he felt better.  Pt stated he is calling to seek advice from Dr. [name]. He questioning if Dr. [name] feels he should have an ECHO and also noted the prescription for plavix was never sent in. Pt stated he contact [facility] and the indicated they can't send prescription for Plavix due to leaving AMA.   Pt currently denies stroke like symptoms and stated he feels fine.   Nurse schedule pt an appointment with Christell Faith Pa, on 3/14.  Will forward to MD for recommendations in the meantime

## 2022-05-22 NOTE — Telephone Encounter (Signed)
   Pt is requesting to get a call back from Dr. [name] he said its about checking out [facility]

## 2022-05-25 ENCOUNTER — Other Ambulatory Visit: Payer: Self-pay

## 2022-05-25 ENCOUNTER — Telehealth: Payer: Self-pay

## 2022-05-25 ENCOUNTER — Ambulatory Visit: Payer: Medicare HMO | Attending: Cardiovascular Disease

## 2022-05-25 DIAGNOSIS — I480 Paroxysmal atrial fibrillation: Secondary | ICD-10-CM

## 2022-05-25 DIAGNOSIS — I350 Nonrheumatic aortic (valve) stenosis: Secondary | ICD-10-CM

## 2022-05-25 MED ORDER — CLOPIDOGREL BISULFATE 75 MG PO TABS: 75.0000 mg | ORAL_TABLET | Freq: Every day | ORAL | 3 refills | Status: DC

## 2022-05-25 NOTE — Telephone Encounter (Signed)
Spoke with patient and informed him of Dr. Donivan Scull recommendations as follows:   I have reviewed hospital notes. Would recommend a Zio monitor to rule out arrhythmia in the setting of TIA. We can order echocardiogram for TIA. Notes from neurology recommended: ASA/Plavix for 3 weeks and then Plavix alone. We can send in Plavix Thx TGollan    Patient understood with read back

## 2022-05-25 NOTE — Progress Notes (Signed)
Spoke with patient and informed him of Dr. Donivan Scull recommendations as follows:  I have reviewed hospital notes. Would recommend a Zio monitor to rule out arrhythmia in the setting of TIA. We can order echocardiogram for TIA. Notes from neurology recommended: ASA/Plavix for 3 weeks and then Plavix alone. We can send in Plavix Thx TGollan   Patient understood with read back

## 2022-05-25 NOTE — Telephone Encounter (Signed)
-----   Message from [name], MD sent at 05/22/2022  3:24 PM EST ----- Kindly inform the patient that I have prescribed Plavix 75 mg daily for 21 days which she can pick up [facility] in visit.  He also needs to take aspirin 81 mg daily lifelong.  Keep scheduled appointment for echocardiogram at his cardiology office and stroke follow-up appointment with my office ----- Message ----- From: Barbaraann Rondo Sent: 05/21/2022   8:35 AM EST To: [name], MD  Hi Dr Leonie Man,  We received a call from a patient that you saw in the hospital. We got him added to your schedule for a hospital follow up, but he advised that last night, he left AMA. He states he was supposed to have one final test performed before discharge and they wanted to keep him overnight again. I believe the test may have been the echo. He advised that he is scheduled with his cardiologist to have this performed. He wanted me to reach out because since he left the hospital AMA, no prescriptions were given/placed for him. He is asking if you would be able to send any [facility] may need [facility] [place] for him.  Please advise

## 2022-05-25 NOTE — Telephone Encounter (Signed)
Called and relayed message to patient. Pt verbalized understanding. Pt had no questions at this time but was encouraged to call back if questions arise.

## 2022-05-27 DIAGNOSIS — Z6825 Body mass index (BMI) 25.0-25.9, adult: Secondary | ICD-10-CM | POA: Diagnosis not present

## 2022-05-27 DIAGNOSIS — R899 Unspecified abnormal finding in specimens from other organs, systems and tissues: Secondary | ICD-10-CM | POA: Diagnosis not present

## 2022-05-27 DIAGNOSIS — I480 Paroxysmal atrial fibrillation: Secondary | ICD-10-CM

## 2022-05-27 DIAGNOSIS — I779 Disorder of arteries and arterioles, unspecified: Secondary | ICD-10-CM | POA: Diagnosis not present

## 2022-05-27 DIAGNOSIS — N1831 Chronic kidney disease, stage 3a: Secondary | ICD-10-CM | POA: Diagnosis not present

## 2022-05-27 DIAGNOSIS — H53122 Transient visual loss, left eye: Secondary | ICD-10-CM | POA: Diagnosis not present

## 2022-05-27 DIAGNOSIS — Z09 Encounter for follow-up examination after completed treatment for conditions other than malignant neoplasm: Secondary | ICD-10-CM | POA: Diagnosis not present

## 2022-05-28 NOTE — Telephone Encounter (Signed)
Pt is asking for RN to call him re: the Plavix, the message from Dr Leonie Man: sent at 05/22/2022  3:24 PM EST ----- Kindly inform the patient that I have prescribed Plavix 75 mg daily for 21 days which she can pick [facility] visit.  He also needs to take aspirin 81 mg daily lifelong.  Was repeated but pt states this is the opposite from what he was told in the hospital, please call pt to discuss.

## 2022-06-01 ENCOUNTER — Encounter (INDEPENDENT_AMBULATORY_CARE_PROVIDER_SITE_OTHER): Payer: Medicare HMO

## 2022-06-01 ENCOUNTER — Ambulatory Visit (INDEPENDENT_AMBULATORY_CARE_PROVIDER_SITE_OTHER): Payer: Medicare HMO | Admitting: Vascular Surgery

## 2022-06-03 NOTE — Progress Notes (Signed)
Cardiology Office Note:    Date:  06/04/2022   ID:  Garrett Moore, DOB 05-28-1940, MRN [id number]  PCP:  [name], [place] Prov[facility] Cardiologist:  Ida Rogue, MD     Referring MD: [name], MD   Chief Complaint  Patient presents with   Follow-up    12 month f/u, no new cardiac concerns    History of Present Illness:    Garrett Moore is a 81 y.o. male with a hx of AAA, s/p CVA, moderate AS, PAD, CAD (PTCA of RCA 1985), bilateral CAD, chronic diastolic HF, COPD, BPH, CKD 3a, HLD.   He most recently re-established care with Dr. [name] in 2022 for cardiac clearance to under go endovascular repair of his AAA. Prior to this, he had not been seen since 2014. Repeat echo was ordered and showed EF 60-65%, grade I DD, trivial MR, mod aortic valve stenosis, aortic dilatation of 38 mm.   Admitted on 2/27-2/28/24 for acute vision loss and suspected stroke, concerning for amaurosis fugax, MRI without any acute findings. He underwent carotid US which showed a chronically occluded left ICA. He ultimately left AMA prior to any other testing. There is mention of PAF in his PMHx, however there is no documented EKG to confirm this. Dr. [name] arranged for him to wear a monitor. Neurology had recommended ASA & Plavix x 3 weeks, then monotherapy with Plavix.   He presents today accompanied by his wife for follow up of possible AF. He states he is feeling "fine" and does not understand all of the work up that is going on since he feels so good. He recounts the events of his recent hospitalization and he was very displeased with his hospital stay. There is still some confusion surrounding if he is to take Plavix monotherapy or ASA monotherapy. He is adamant that he was told to take Plavix indefinitely and ASA for 21 days per neurology, and his attempts to clarify have been complicated by him leaving AMA. He is wearing a Zio monitor, the study will end on 06/09/22. He  is also adamant that he has never been diagnosed with atrial fibrillation in the past. He has no known EKGs confirming this. Upon review of his chart, it appears it was first mentioned on a progress note in 2022 from an outside office.  He denies further vision loss, HA, dizziness, or weakness. He denies chest pain, palpitations, dyspnea, pnd, orthopnea, n, v, dizziness, syncope, edema, weight gain, or early satiety.    Past Medical History:  Diagnosis Date   Aneurysm of infrarenal abdominal aorta ([place]) 02/08/2018   a.) TTE 02/08/2018: Ao root 38 mm, asc Ao 40 mm; b.) TTE 06/14/2020; asc Ao 28 mm; c.) CT hematuria 05/10/2020: infrarenal AAA measuring 5.9 cm; d.) s/p EVAR 07/03/2020; e.) CT A/P 10/14/2021: residual aneurysmal sac (s/p EVAR) measured 6.0 cm   Aortic atherosclerosis (HCC)    Aortic valvar stenosis 07/21/2012   a.) TTE 07/21/2012: EF 50-55%, mild AS (AVA = 1.67, MPG 9); b.) TTE 02/08/2018: EF 60-65%; mod AS (MPG 17); c.) TTE 06/14/2020: EF 60-65%, mod AS (MPG 26.8)   Atherosclerosis of native arteries of extremity with intermittent claudication (HCC)    Basal cell carcinoma, face    BPH (benign prostatic hyperplasia)    Carotid artery stenosis 05/21/2006   a.) carotid US 05/21/2006: 0-49% BICA; b.) carotid US 06/10/2020: 1-39% RICA, total occ LICA   CKD (chronic kidney disease), stage III ([place])  COPD (chronic obstructive pulmonary disease) (HCC)    Coronary artery disease 08/14/1983   a.) MI --> LHC 08/13/2016: CTO of the RCA --> PTCA performed resulting in 30% residual stenosis; aberrant takeoff of his RCA coming off somewhat high and posteriorly.   Diastolic dysfunction 0000000   a.) TTE 02/08/2018: EF 60-65%, mild MAC,  triv TR, mild-mod AS, G1DD; b.) TTE 06/14/2020: EF 60-65%, mod AS, triv TR, mild AR, G1DD   Diverticulosis    Family history of adverse reaction to anesthesia    a.) extended GA course for mother --> postoperaitve memory problems/dementia   GERD  (gastroesophageal reflux disease)    Glaucoma    Heart murmur    History of atrial fibrillation    a.) suspect in setting of admission for MI; recent ECGs reveal NSR   History of kidney stones    Hyperlipidemia    Iliac artery aneurysm, bilateral ([place]) 07/03/2020   a.) s/p insertion of BILATERAL iliac artery stents   Myocardial infarction ([place]) 08/13/2016   a.) MI --> LHC 08/13/2016: CTO of the RCA --> PTCA performed resulting in 30% residual stenosis. Procedure complicated by procedural nausea, Patient went into IVR and then AV disaccociated rhythm. SBP dropped (80s). TVP floated with capture, which stabilized patient. ICU admission overnight.   OA (osteoarthritis)    Peripheral vascular disease (HCC)    Postoperative deep vein thrombosis (DVT) ([place])    a.) postop age 63 following tonsillectomy (per patient report)    Past Surgical History:  Procedure Laterality Date   CATARACT EXTRACTION, BILATERAL  01/2019   CORONARY ANGIOPLASTY  08/14/1983   Procedure: CORONARY ANGIOPLASTY (PTCA pRCA); Location: [facility]; Surgeon: Al Little, MD   CYSTOSCOPY W/ RETROGRADES  08/12/2020   Procedure: CYSTOSCOPY WITH RETROGRADE PYELOGRAM;  Surgeon: Hollice Espy, MD;  Location: [facility];  Service: Urology;;   CYSTOSCOPY WITH INSERTION OF UROLIFT N/A 12/22/2021   Procedure: CYSTOSCOPY WITH INSERTION OF UROLIFT;  Surgeon: Hollice Espy, MD;  Location: [facility];  Service: Urology;  Laterality: N/A;   CYSTOSCOPY/URETEROSCOPY/HOLMIUM LASER/STENT PLACEMENT Left 08/12/2020   Procedure: CYSTOSCOPY/URETEROSCOPY/HOLMIUM LASER/STENT PLACEMENT;  Surgeon: Hollice Espy, MD;  Location: [facility];  Service: Urology;  Laterality: Left;   ENDOVASCULAR REPAIR/STENT GRAFT N/A 07/03/2020   Procedure: ENDOVASCULAR REPAIR/STENT GRAFT;  Surgeon: Katha Cabal, MD;  Location: [facility];  Service: Cardiovascular;  Laterality: N/A;   PELVIC ANGIOGRAPHY Right 10/14/2021   Procedure: PELVIC ANGIOGRAPHY;   Surgeon: Katha Cabal, MD;  Location: [facility];  Service: Cardiovascular;  Laterality: Right;   PERIPHERAL VASCULAR THROMBECTOMY Right 10/14/2021   Procedure: PERIPHERAL VASCULAR THROMBECTOMY;  Surgeon: Katha Cabal, MD;  Location: [facility];  Service: Cardiovascular;  Laterality: Right;   ROTATOR CUFF REPAIR Left    TEMPORARY PACEMAKER  08/14/1983   Procedure: TEMPORARY PACEMAKER PLACEMENT; Location: [facility]; Surgeon: Al Little, MD   TONSILLECTOMY     TOTAL HIP ARTHROPLASTY Left 02/21/2019   Procedure: TOTAL HIP ARTHROPLASTY ANTERIOR APPROACH;  Surgeon: Paralee Cancel, MD;  Location: [facility];  Service: Orthopedics;  Laterality: Left;  70 mins   VITRECTOMY Bilateral     Current Medications: Current Meds  Medication Sig   aspirin EC 81 MG tablet Take 81 mg by mouth at bedtime. Swallow whole.   clopidogrel (PLAVIX) 75 MG tablet Take 1 tablet (75 mg total) by mouth daily.   Multiple Vitamin (MULTIVITAMIN WITH MINERALS) TABS tablet Take 1 tablet by mouth daily.   nicotine polacrilex (COMMIT) 2 MG  lozenge Take 2 mg by mouth as needed for smoking cessation.   simvastatin (ZOCOR) 80 MG tablet Take 80 mg by mouth at bedtime.   Current Facility-Administered Medications for the 06/04/22 encounter (Office Visit) with Trudi Ida, NP  Medication   cefTRIAXone (ROCEPHIN) injection 1 g     Allergies:   Quinolones and Sulfa antibiotics   Social History   Socioeconomic History   Marital status: Married    Spouse name: [name]   Number of children: 2   Years of education: Not on file   Highest education level: Not on file  Occupational History   Occupation: Freight forwarder of [employer]    Comment: retired  Tobacco Use   Smoking status: Former    Packs/day: 2.00    Years: 50.00    Additional pack years: 0.00    Total pack years: 100.00    Types: Cigarettes    Quit date: 2004    Years since quitting: 20.2   Smokeless tobacco: Never   Tobacco  comments:    quit 17 years ago  Vaping Use   Vaping Use: Never used  Substance and Sexual Activity   Alcohol use: Not Currently   Drug use: Never   Sexual activity: Not on file  Other Topics Concern   Not on file  Social History Narrative   Patient lives with wife in his home.  He feels safe in his home.   No stairs.    Social Determinants of Health   Financial Resource Strain: Not on file  Food Insecurity: No Food Insecurity (05/20/2022)   Hunger Vital Sign    Worried About Running Out of Food in the Last Year: Never true    Ran Out of Food in the Last Year: Never true  Transportation Needs: No Transportation Needs (05/20/2022)   PRAPARE - Hydrologist (Medical): No    Lack of Transportation (Non-Medical): No  Physical Activity: Not on file  Stress: Not on file  Social Connections: Not on file     Family History: The patient's family history includes AAA (abdominal aortic aneurysm) in his mother; Heart Problems in his mother.  ROS:   Please see the history of present illness.    All other systems reviewed and are negative.  EKGs/Labs/Other Studies Reviewed:    The following studies were reviewed today:  05/20/22 carotid US -  Summary:  Right Carotid: Velocities in the right ICA are consistent with a 1-39%  stenosis.   Left Carotid: Evidence consistent with a total occlusion of the left ICA.   Vertebrals: Bilateral vertebral arteries demonstrate antegrade flow.    06/14/20 echo complete - EF 60-65%, grade I DD, trivial MR, moderate AS, aortic dilatation noted 72m   EKG:  EKG is ordered today.  The ekg ordered today demonstrates NSR HR 74 bpm.   Recent Labs: 05/20/2022: ALT 22; BUN 13; Creatinine, Ser 1.36; Hemoglobin 12.6; Magnesium 2.1; Platelets 262; Potassium 3.9; Sodium 136  Recent Lipid Panel    Component Value Date/Time   CHOL 115 05/20/2022 0357   TRIG 96 05/20/2022 0357   HDL 36 (L) 05/20/2022 0357   CHOLHDL 3.2  05/20/2022 0357   VLDL 19 05/20/2022 0357   LDLCALC 60 05/20/2022 0357     Risk Assessment/Calculations:                Physical Exam:    VS:  BP 128/66 (BP Location: Left Arm, Patient Position: Sitting, Cuff Size: Normal)  Pulse 72   Ht '5\' 11"'$  (1.803 m)   Wt 186 lb 9.6 oz (84.6 kg)   SpO2 97%   BMI 26.03 kg/m     Wt Readings from Last 3 Encounters:  06/04/22 186 lb 9.6 oz (84.6 kg)  05/19/22 185 lb (83.9 kg)  02/04/22 181 lb 9.6 oz (82.4 kg)     GEN:  Well nourished, well developed in no acute distress HEENT: Normal NECK: No JVD; No carotid bruits LYMPHATICS: No lymphadenopathy CARDIAC: RRR, 2/6 murmur, rubs, gallops RESPIRATORY:  Clear to auscultation without rales, wheezing or rhonchi  ABDOMEN: Soft, non-tender, non-distended MUSCULOSKELETAL:  No edema; No deformity  SKIN: Warm and dry NEUROLOGIC:  Alert and oriented x 3 PSYCHIATRIC:  Normal affect   ASSESSMENT:    1. Coronary artery disease involving native coronary artery of native heart with angina pectoris ([place])   2. Carotid artery disease, unspecified laterality, unspecified type ([place])   3. Moderate aortic stenosis   4. PAD (peripheral artery disease) ([place])   5. Mixed hyperlipidemia   6. Vision loss    PLAN:    In order of problems listed above:  CAD - s/p PTCA in 1985, Stable with no anginal symptoms. No indication for ischemic evaluation.  Continue ASA, simvastatin.   Questionable AF - concern for AF given transient vision loss. Reviewed all of his EKGs available in EPIC and none reveal AF. He is currently wearing a monitor, defer DOAC therapy at this time until definitive objective data is obtained.   Carotid artery disease - known total left ICA occlusion, mild ICA on right. Continue ASA indefinitely per neurology.   Vision loss - Suspected amaurosis fugax, he has had a few other transient episodes over the last few years. On Plavix and ASA currently per neurology. Has f/u with neurology in  April, strongly encouraged him to keep this appt.   Moderate AS - moderate on echo in 2022, repeat echo scheduled for 07/06/22. Denies HF s/s.   PAD - denies claudication, managed by [facility].   HLD - LDL on 05/20/22 60, well controlled, continue simvastatin, managed by his PCP.   Disposition - Return in 1 month.            Medication Adjustments/Labs and Tests Ordered: Current medicines are reviewed at length with the patient today.  Concerns regarding medicines are outlined above.  Orders Placed This Encounter  Procedures   EKG 12-Lead   No orders of the defined types were placed in this encounter.   Patient Instructions  Medication Instructions:  No changes at this time.   *If you need a refill on your cardiac medications before your next appointment, please call your pharmacy*   Lab Work: None  If you have labs (blood work) drawn today and your tests are completely normal, you will receive your results only by: [place] (if you have MyChart) OR A paper copy in the mail If you have any lab test that is abnormal or we need to change your treatment, we will call you to review the results.   Testing/Procedures: None   Follow-Up: At [facility], you and your health needs are our priority.  As part of our continuing mission to provide you with exceptional heart care, we have created designated Provider Care Teams.  These Care Teams include your primary Cardiologist (physician) and Advanced Practice Providers (APPs -  Physician Assistants and Nurse Practitioners) who all work together to provide you with the care you need, when you  need it.   Your next appointment:   1 month(s)  Provider:   Ida Rogue, MD or Christell Faith, PA-C    Signed, Trudi Ida, NP  06/04/2022 12:31 PM    [place]

## 2022-06-04 ENCOUNTER — Encounter: Payer: Self-pay | Admitting: Physician Assistant

## 2022-06-04 ENCOUNTER — Ambulatory Visit: Payer: Medicare HMO | Attending: Medical | Admitting: Cardiology

## 2022-06-04 VITALS — BP 128/66 | HR 72 | Ht 71.0 in | Wt 186.6 lb

## 2022-06-04 DIAGNOSIS — I779 Disorder of arteries and arterioles, unspecified: Secondary | ICD-10-CM

## 2022-06-04 DIAGNOSIS — I739 Peripheral vascular disease, unspecified: Secondary | ICD-10-CM | POA: Diagnosis not present

## 2022-06-04 DIAGNOSIS — I25119 Atherosclerotic heart disease of native coronary artery with unspecified angina pectoris: Secondary | ICD-10-CM

## 2022-06-04 DIAGNOSIS — H547 Unspecified visual loss: Secondary | ICD-10-CM

## 2022-06-04 DIAGNOSIS — I350 Nonrheumatic aortic (valve) stenosis: Secondary | ICD-10-CM

## 2022-06-04 DIAGNOSIS — E782 Mixed hyperlipidemia: Secondary | ICD-10-CM

## 2022-06-04 NOTE — Patient Instructions (Signed)
Medication Instructions:  No changes at this time.   *If you need a refill on your cardiac medications before your next appointment, please call your pharmacy*   Lab Work: None  If you have labs (blood work) drawn today and your tests are completely normal, you will receive your results only by: [place] (if you have MyChart) OR A paper copy in the mail If you have any lab test that is abnormal or we need to change your treatment, we will call you to review the results.   Testing/Procedures: None   Follow-Up: At [facility], you and your health needs are our priority.  As part of our continuing mission to provide you with exceptional heart care, we have created designated Provider Care Teams.  These Care Teams include your primary Cardiologist (physician) and Advanced Practice Providers (APPs -  Physician Assistants and Nurse Practitioners) who all work together to provide you with the care you need, when you need it.   Your next appointment:   1 month(s)  Provider:   Ida Rogue, MD or Christell Faith, PA-C

## 2022-06-05 ENCOUNTER — Other Ambulatory Visit: Payer: Self-pay | Admitting: Neurology

## 2022-06-07 ENCOUNTER — Telehealth: Payer: Self-pay | Admitting: Cardiology

## 2022-06-08 NOTE — Telephone Encounter (Signed)
The pt is returning Pamela's call regarding

## 2022-06-08 NOTE — Telephone Encounter (Signed)
Left voicemail message to call back for review of recommendations.     Trudi Ida, NP  Sent: Sun June 07, 2022  3:28 PM  To: [facility] Triage   Message  ----- Message from Trudi Ida, NP sent at 06/07/2022  3:28 PM EDT -----  Please let Mr. Zamorski know that we clarified with Dr. Leonie Man and this was his response:    My hospital note clearly mentions aspirin Plavix for 3 weeks and then Plavix alone.    Continue Plavix, can stop ASA after 21 days.

## 2022-06-08 NOTE — Telephone Encounter (Signed)
Reviewed provider recommendations and he verbalized understanding with no further questions at this time.  

## 2022-06-08 NOTE — Telephone Encounter (Signed)
Per chart, Someone from his Cardiologist has already reached out to pt in regards to plavix

## 2022-06-12 DIAGNOSIS — H524 Presbyopia: Secondary | ICD-10-CM | POA: Diagnosis not present

## 2022-06-17 DIAGNOSIS — I480 Paroxysmal atrial fibrillation: Secondary | ICD-10-CM | POA: Diagnosis not present

## 2022-06-23 ENCOUNTER — Ambulatory Visit: Payer: Medicare HMO | Admitting: Medical

## 2022-07-06 ENCOUNTER — Ambulatory Visit: Payer: Medicare HMO | Attending: Cardiovascular Disease

## 2022-07-06 DIAGNOSIS — I350 Nonrheumatic aortic (valve) stenosis: Secondary | ICD-10-CM | POA: Diagnosis not present

## 2022-07-06 LAB — ECHOCARDIOGRAM COMPLETE
AR max vel: 0.79 cm2
AV Area VTI: 0.82 cm2
AV Area mean vel: 0.75 cm2
AV Mean grad: 28.5 mmHg
AV Peak grad: 50.8 mmHg
Ao pk vel: 3.56 m/s
Area-P 1/2: 2.99 cm2
S' Lateral: 3.7 cm

## 2022-07-06 MED ORDER — PERFLUTREN LIPID MICROSPHERE
1.0000 mL | INTRAVENOUS | Status: AC | PRN
Start: 2022-07-06 — End: 2022-07-06
  Administered 2022-07-06: 2 mL via INTRAVENOUS

## 2022-07-07 ENCOUNTER — Encounter: Payer: Self-pay | Admitting: Physician Assistant

## 2022-07-07 ENCOUNTER — Ambulatory Visit: Payer: Medicare HMO | Attending: Physician Assistant | Admitting: Physician Assistant

## 2022-07-07 VITALS — BP 124/58 | HR 69 | Ht 71.0 in | Wt 182.0 lb

## 2022-07-07 DIAGNOSIS — I779 Disorder of arteries and arterioles, unspecified: Secondary | ICD-10-CM

## 2022-07-07 DIAGNOSIS — H547 Unspecified visual loss: Secondary | ICD-10-CM

## 2022-07-07 DIAGNOSIS — I2510 Atherosclerotic heart disease of native coronary artery without angina pectoris: Secondary | ICD-10-CM

## 2022-07-07 DIAGNOSIS — I714 Abdominal aortic aneurysm, without rupture, unspecified: Secondary | ICD-10-CM | POA: Diagnosis not present

## 2022-07-07 DIAGNOSIS — I471 Supraventricular tachycardia, unspecified: Secondary | ICD-10-CM | POA: Diagnosis not present

## 2022-07-07 DIAGNOSIS — E785 Hyperlipidemia, unspecified: Secondary | ICD-10-CM | POA: Diagnosis not present

## 2022-07-07 DIAGNOSIS — I350 Nonrheumatic aortic (valve) stenosis: Secondary | ICD-10-CM | POA: Diagnosis not present

## 2022-07-07 DIAGNOSIS — I739 Peripheral vascular disease, unspecified: Secondary | ICD-10-CM | POA: Diagnosis not present

## 2022-07-07 NOTE — Progress Notes (Signed)
Cardiology Office Note    Date:  07/07/2022   ID:  Garrett Moore, DOB Apr 07, 1940, MRN [id number]  PCP:  Daisy Floro, MD  Cardiologist:  Julien Nordmann, MD  Electrophysiologist:  None   Chief Complaint: Follow-up  History of Present Illness:   Garrett Moore is a 82 y.o. male with history of CAD with PTCA of the RCA in 1985, moderate to severe aortic stenosis, PSVT, AAA status EVAR in 06/2020 with most recent ultrasound from 01/2022 showing no endoleak with largest aortic measurement 5.6 cm (previously 5.7 cm in 07/2021), iliac artery aneurysm status postrepair in 05/2020, PAD, mild to moderate bilateral carotid artery disease with ultrasound from 04/2022 showing 1 to 39% right ICA stenosis with total occlusion of the left ICA, CKD stage III, COPD, and HLD who presents for follow-up of echo.  He re-established care with Dr. Mariah Milling in 2022 (prior to this had not been evaluated since 2014).  Echo from 05/2020 demonstrated an EF of 60 to 65%, no regional wall motion abnormalities, grade 1 diastolic dysfunction, normal RV systolic function and ventricular cavity size, trivial mitral regurgitation, aortic valve with an indeterminate number of cusps with mild insufficiency and moderate aortic stenosis with a mean gradient of 26.8 mmHg and a peak gradient of 50.4 mmHg with a valve area of 1.58 cm.  There was also borderline dilatation of the ascending aorta measuring 38 mm.  He was admitted in 04/2022 for acute vision loss lasting approximately 1 second.  MRI was without acute findings.  Carotid artery ultrasound showed a chronically occluded left ICA.  He ultimately left AMA.  There was question of possible A-fib diagnosis, objective evidence of this was unable to be identified.  Patient reported that he had never been diagnosed with A-fib.  He was seen in the office in 05/2022 and was without symptoms of angina or cardiac decompensation.  Zio patch in 05/2022 showed a predominant rhythm of sinus  with an average rate of 75 bpm (range 21 to 182 bpm), 1 run of NSVT lasting 7 beats, 24 episodes of SVT with the fastest and longest interval lasting 18 beats, second-degree AV block type I noted nocturnally, and rare PACs and PVCs.  Patient triggered events were associated with sinus rhythm.  Echo performed on 07/06/2022 showed an EF of 60 to 65%, no regional wall motion abnormalities, grade 2 diastolic dysfunction, normal RV systolic function and ventricular cavity size, moderate to severe aortic stenosis with a mean gradient of 30 mmHg, peak gradient 53 mmHg, and an estimated right atrial pressure of 8 mmHg.  He comes in doing well from a cardiac perspective and is without symptoms of angina or cardiac decompensation.  No dyspnea, palpitations, dizziness, presyncope, or syncope.  No further visual deficits.  No upper or lower extremity numbness or weakness.  No dysarthria or aphasia.  No lower lower extremity swelling or progressive orthopnea.  Reports being told about cardiac murmur years ago.  He does not have any acute cardiac concerns at this time.   Labs independently reviewed: 04/2022 - TC 115, TG 96, HDL 36, LDL 60, magnesium 2.1, potassium 3.9, BUN 13, serum creatinine 1.36, albumin 3.0, AST/ALT normal, A1c 6.0, Hgb 13.8, PLT 279  Past Medical History:  Diagnosis Date   Aneurysm of infrarenal abdominal aorta 02/08/2018   a.) TTE 02/08/2018: Ao root 38 mm, asc Ao 40 mm; b.) TTE 06/14/2020; asc Ao 28 mm; c.) CT hematuria 05/10/2020: infrarenal AAA measuring 5.9 cm; d.) s/p EVAR  07/03/2020; e.) CT A/P 10/14/2021: residual aneurysmal sac (s/p EVAR) measured 6.0 cm   Aortic atherosclerosis    Aortic valvar stenosis 07/21/2012   a.) TTE 07/21/2012: EF 50-55%, mild AS (AVA = 1.67, MPG 9); b.) TTE 02/08/2018: EF 60-65%; mod AS (MPG 17); c.) TTE 06/14/2020: EF 60-65%, mod AS (MPG 26.8)   Atherosclerosis of native arteries of extremity with intermittent claudication    Basal cell carcinoma, face     BPH (benign prostatic hyperplasia)    Carotid artery stenosis 05/21/2006   a.) carotid US 05/21/2006: 0-49% BICA; b.) carotid US 06/10/2020: 1-39% RICA, total occ LICA   CKD (chronic kidney disease), stage III    COPD (chronic obstructive pulmonary disease)    Coronary artery disease 08/14/1983   a.) MI --> LHC 08/13/2016: CTO of the RCA --> PTCA performed resulting in 30% residual stenosis; aberrant takeoff of his RCA coming off somewhat high and posteriorly.   Diastolic dysfunction 02/08/2018   a.) TTE 02/08/2018: EF 60-65%, mild MAC,  triv TR, mild-mod AS, G1DD; b.) TTE 06/14/2020: EF 60-65%, mod AS, triv TR, mild AR, G1DD   Diverticulosis    Family history of adverse reaction to anesthesia    a.) extended GA course for mother --> postoperaitve memory problems/dementia   GERD (gastroesophageal reflux disease)    Glaucoma    Heart murmur    History of kidney stones    Hyperlipidemia    Iliac artery aneurysm, bilateral 07/03/2020   a.) s/p insertion of BILATERAL iliac artery stents   Myocardial infarction 08/13/2016   a.) MI --> LHC 08/13/2016: CTO of the RCA --> PTCA performed resulting in 30% residual stenosis. Procedure complicated by procedural nausea, Patient went into IVR and then AV disaccociated rhythm. SBP dropped (80s). TVP floated with capture, which stabilized patient. ICU admission overnight.   OA (osteoarthritis)    Peripheral vascular disease    Postoperative deep vein thrombosis (DVT)    a.) postop age 42 following tonsillectomy (per patient report)    Past Surgical History:  Procedure Laterality Date   CATARACT EXTRACTION, BILATERAL  01/2019   CORONARY ANGIOPLASTY  08/14/1983   Procedure: CORONARY ANGIOPLASTY (PTCA pRCA); Location: [facility]; Surgeon: Al Little, MD   CYSTOSCOPY W/ RETROGRADES  08/12/2020   Procedure: CYSTOSCOPY WITH RETROGRADE PYELOGRAM;  Surgeon: Vanna Scotland, MD;  Location: [facility];  Service: Urology;;   CYSTOSCOPY WITH INSERTION OF UROLIFT  N/A 12/22/2021   Procedure: CYSTOSCOPY WITH INSERTION OF UROLIFT;  Surgeon: Vanna Scotland, MD;  Location: [facility];  Service: Urology;  Laterality: N/A;   CYSTOSCOPY/URETEROSCOPY/HOLMIUM LASER/STENT PLACEMENT Left 08/12/2020   Procedure: CYSTOSCOPY/URETEROSCOPY/HOLMIUM LASER/STENT PLACEMENT;  Surgeon: Vanna Scotland, MD;  Location: [facility];  Service: Urology;  Laterality: Left;   ENDOVASCULAR REPAIR/STENT GRAFT N/A 07/03/2020   Procedure: ENDOVASCULAR REPAIR/STENT GRAFT;  Surgeon: Renford Dills, MD;  Location: [facility];  Service: Cardiovascular;  Laterality: N/A;   PELVIC ANGIOGRAPHY Right 10/14/2021   Procedure: PELVIC ANGIOGRAPHY;  Surgeon: Renford Dills, MD;  Location: [facility];  Service: Cardiovascular;  Laterality: Right;   PERIPHERAL VASCULAR THROMBECTOMY Right 10/14/2021   Procedure: PERIPHERAL VASCULAR THROMBECTOMY;  Surgeon: Renford Dills, MD;  Location: [facility];  Service: Cardiovascular;  Laterality: Right;   ROTATOR CUFF REPAIR Left    TEMPORARY PACEMAKER  08/14/1983   Procedure: TEMPORARY PACEMAKER PLACEMENT; Location: [facility]; Surgeon: Al Little, MD   TONSILLECTOMY     TOTAL HIP ARTHROPLASTY Left 02/21/2019   Procedure: TOTAL HIP  ARTHROPLASTY ANTERIOR APPROACH;  Surgeon: Durene Romans, MD;  Location: [facility];  Service: Orthopedics;  Laterality: Left;  70 mins   VITRECTOMY Bilateral     Current Medications: Current Meds  Medication Sig   clopidogrel (PLAVIX) 75 MG tablet Take 1 tablet (75 mg total) by mouth daily.   Multiple Vitamin (MULTIVITAMIN WITH MINERALS) TABS tablet Take 1 tablet by mouth daily.   nicotine polacrilex (COMMIT) 2 MG lozenge Take 2 mg by mouth as needed for smoking cessation.   simvastatin (ZOCOR) 80 MG tablet Take 80 mg by mouth at bedtime.   Current Facility-Administered Medications for the 07/07/22 encounter (Office Visit) with Sondra Barges, PA-C  Medication   cefTRIAXone (ROCEPHIN) injection 1 g     Allergies:   Quinolones and Sulfa antibiotics   Social History   Socioeconomic History   Marital status: Married    Spouse name: Britta Mccreedy   Number of children: 2   Years of education: Not on file   Highest education level: Not on file  Occupational History   Occupation: Production designer, theatre/television/film of [employer]    Comment: retired  Tobacco Use   Smoking status: Former    Packs/day: 2.00    Years: 50.00    Additional pack years: 0.00    Total pack years: 100.00    Types: Cigarettes    Quit date: 2004    Years since quitting: 20.3   Smokeless tobacco: Never   Tobacco comments:    quit 17 years ago  Vaping Use   Vaping Use: Never used  Substance and Sexual Activity   Alcohol use: Not Currently   Drug use: Never   Sexual activity: Not on file  Other Topics Concern   Not on file  Social History Narrative   Patient lives with wife in his home.  He feels safe in his home.   No stairs.    Social Determinants of Health   Financial Resource Strain: Not on file  Food Insecurity: No Food Insecurity (05/20/2022)   Hunger Vital Sign    Worried About Running Out of Food in the Last Year: Never true    Ran Out of Food in the Last Year: Never true  Transportation Needs: No Transportation Needs (05/20/2022)   PRAPARE - Administrator, Civil Service (Medical): No    Lack of Transportation (Non-Medical): No  Physical Activity: Not on file  Stress: Not on file  Social Connections: Not on file     Family History:  The patient's family history includes AAA (abdominal aortic aneurysm) in his mother; Heart Problems in his mother.  ROS:   12-point review of systems is negative unless otherwise noted in the HPI.   EKGs/Labs/Other Studies Reviewed:    Studies reviewed were summarized above. The additional studies were reviewed today:  2D echo 07/06/2022: 1. Left ventricular ejection fraction, by estimation, is 60 to 65%. The  left ventricle has normal function. The left ventricle  has no regional  wall motion abnormalities. Left ventricular diastolic parameters are  consistent with Grade II diastolic  dysfunction (pseudonormalization).   2. Right ventricular systolic function is normal. The right ventricular  size is normal.   3. The mitral valve is normal in structure. No evidence of mitral valve  regurgitation.   4. Aortic valve peak gradient , mean gradient 30 mmHg, Vmax 3.7  m/s, DVI 0.26. The aortic valve is calcified. Aortic valve regurgitation  is mild. Moderate to severe aortic valve stenosis.   5. The  inferior vena cava is dilated in size with >50% respiratory  variability, suggesting right atrial pressure of 8 mmHg.   Comparison(s): Previous AV meas were reported as maxPG, .  __________  Luci Bank patch 05/2022: Normal sinus rhythm Patient had a min HR of 21 bpm, max HR of 182 bpm, and avg HR of 75 bpm.    1 run of Ventricular Tachycardia occurred lasting 7 beats with a max rate of 141 bpm (avg 120 bpm). (Not triggered)   24 Supraventricular Tachycardia runs occurred, the run with the fastest interval lasting 18 beats with a max rate of 182 bpm (avg 164 bpm); the run with the fastest interval was also the longest.    Second Degree AV Block-Mobitz I (Wenckebach) was present.    Isolated SVEs were rare (<1.0%), SVE Couplets were rare (<1.0%), and SVE Triplets were rare (<1.0%). Isolated VEs were rare (<1.0%), VE Couplets were rare (<1.0%), and no VE Triplets were present.    Patient triggered events (2)  associated with normal sinus rhythm __________  2D echo 02/08/2018: - Left ventricle: The cavity size was normal. Systolic function was    normal. The estimated ejection fraction was in the range of 60%    to 65%. Wall motion was normal; there were no regional wall    motion abnormalities. There was an increased relative    contribution of atrial contraction to ventricular filling.    Doppler parameters are consistent with abnormal  left ventricular    relaxation (grade 1 diastolic dysfunction).  - Aortic valve: There was mild to moderate stenosis. There was mild    regurgitation. Mean gradient (S): 17 mm Hg. Valve area (VTI):    1.74 cm^2. Valve area (Vmax): 1.71 cm^2. Valve area (Vmean): 1.56    cm^2.  - Aorta: Aortic root dimension: 38 mm (ED). Ascending aortic    diameter: 40 mm (S).  - Aortic root: The aortic root was mildly dilated.  - Ascending aorta: The ascending aorta was mildly dilated.  - Mitral valve: Calcified annulus.  - Atrial septum: There was increased thickness of the septum,    consistent with lipomatous hypertrophy.  - Tricuspid valve: There was trivial regurgitation.  __________  See CV studies for more remote studies   EKG:  EKG is ordered today.  The EKG ordered today demonstrates NSR, 69 bpm, nonspecific inferolateral ST-T changes consistent with prior tracing  Recent Labs: 05/20/2022: ALT 22; BUN 13; Creatinine, Ser 1.36; Hemoglobin 12.6; Magnesium 2.1; Platelets 262; Potassium 3.9; Sodium 136  Recent Lipid Panel    Component Value Date/Time   CHOL 115 05/20/2022 0357   TRIG 96 05/20/2022 0357   HDL 36 (L) 05/20/2022 0357   CHOLHDL 3.2 05/20/2022 0357   VLDL 19 05/20/2022 0357   LDLCALC 60 05/20/2022 0357    PHYSICAL EXAM:    VS:  BP (!) 124/58 (BP Location: Left Arm, Patient Position: Sitting, Cuff Size: Normal)   Pulse 69   Ht 5\' 11"  (1.803 m)   Wt 182 lb (82.6 kg)   SpO2 99%   BMI 25.38 kg/m   BMI: Body mass index is 25.38 kg/m.  Physical Exam Vitals reviewed.  Constitutional:      Appearance: He is well-developed.  HENT:     Head: Normocephalic and atraumatic.  Eyes:     General:        Right eye: No discharge.        Left eye: No discharge.  Neck:     Vascular:  No JVD.  Cardiovascular:     Rate and Rhythm: Normal rate and regular rhythm.     Pulses:          Posterior tibial pulses are 2+ on the right side and 2+ on the left side.     Heart sounds: S1  normal and S2 normal. Heart sounds not distant. No midsystolic click and no opening snap. Murmur heard.     Systolic murmur is present with a grade of 2/6 at the upper right sternal border.     No friction rub.  Pulmonary:     Effort: Pulmonary effort is normal. No respiratory distress.     Breath sounds: Normal breath sounds. No decreased breath sounds, wheezing or rales.  Chest:     Chest wall: No tenderness.  Abdominal:     General: There is no distension.  Musculoskeletal:     Cervical back: Normal range of motion.     Right lower leg: No edema.     Left lower leg: No edema.  Skin:    General: Skin is warm and dry.     Nails: There is no clubbing.  Neurological:     Mental Status: He is alert and oriented to person, place, and time.  Psychiatric:        Speech: Speech normal.        Behavior: Behavior normal.        Thought Content: Thought content normal.        Judgment: Judgment normal.     Wt Readings from Last 3 Encounters:  07/07/22 182 lb (82.6 kg)  06/04/22 186 lb 9.6 oz (84.6 kg)  05/19/22 185 lb (83.9 kg)     ASSESSMENT & PLAN:   CAD involving the native coronary arteries without angina: He is doing well and without symptoms concerning for angina.  Continue aggressive risk factor modification and secondary prevention including clopidogrel (in place of aspirin given neurology recommendations) along with simvastatin.  No indication for ischemic testing at this time.  Moderate to severe aortic stenosis: Asymptomatic.  Discussed monitoring with limited echo in 6 months versus referral to structural heart clinic.  Given he is completely asymptomatic, and in the context of physical exam showing stable murmur, we will pursue a limited echo in 6 months.  Should he develop symptoms or if there is progression in aortic valve stenosis would recommend referral to structural heart team.  PSVT: Quiescent.  Not currently requiring AV nodal blocking medication.  Carotid artery  disease: Known left occluded ICA with mild right ICA stenosis.  Remains on clopidogrel and simvastatin.  AAA/PAD: Stable on most recent ultrasound.  Followed by vascular surgery.  HLD: LDL 60 in 04/2022.  Remains on simvastatin.  Vision loss with suspected amaurosis fugax: On clopidogrel at recommendation of neurology.  Follow-up with neurology as directed.    Disposition: F/u with Dr. Mariah Milling or an APP in 6 months (after echo).   Medication Adjustments/Labs and Tests Ordered: Current medicines are reviewed at length with the patient today.  Concerns regarding medicines are outlined above. Medication changes, Labs and Tests ordered today are summarized above and listed in the Patient Instructions accessible in Encounters.   Signed, Eula Listen, PA-C 07/07/2022 9:23 AM     [facility] [place] [address] [place], [place] [address] [phone]

## 2022-07-07 NOTE — Patient Instructions (Signed)
Medication Instructions:  No changes at this time.   *If you need a refill on your cardiac medications before your next appointment, please call your pharmacy*   Lab Work: None  If you have labs (blood work) drawn today and your tests are completely normal, you will receive your results only by: MyChart Message (if you have MyChart) OR A paper copy in the mail If you have any lab test that is abnormal or we need to change your treatment, we will call you to review the results.   Testing/Procedures: Your physician has requested that you have an echocardiogram. Echocardiography is a painless test that uses sound waves to create images of your heart. It provides your doctor with information about the size and shape of your heart and how well your heart's chambers and valves are working. This procedure takes approximately one hour. There are no restrictions for this procedure. Please do NOT wear cologne, perfume, aftershave, or lotions (deodorant is allowed). Please arrive 15 minutes prior to your appointment time.    Follow-Up: At [facility], you and your health needs are our priority.  As part of our continuing mission to provide you with exceptional heart care, we have created designated Provider Care Teams.  These Care Teams include your primary Cardiologist (physician) and Advanced Practice Providers (APPs -  Physician Assistants and Nurse Practitioners) who all work together to provide you with the care you need, when you need it.  Your next appointment:   Follow up after echocardiogram.   Provider:   Julien Nordmann, MD or Eula Listen, PA-C

## 2022-07-20 ENCOUNTER — Ambulatory Visit: Payer: Medicare HMO | Admitting: Neurology

## 2022-07-20 ENCOUNTER — Encounter: Payer: Self-pay | Admitting: Neurology

## 2022-07-20 VITALS — BP 137/64 | HR 75 | Ht 71.0 in | Wt 180.0 lb

## 2022-07-20 DIAGNOSIS — G459 Transient cerebral ischemic attack, unspecified: Secondary | ICD-10-CM | POA: Diagnosis not present

## 2022-07-20 DIAGNOSIS — I6522 Occlusion and stenosis of left carotid artery: Secondary | ICD-10-CM

## 2022-07-20 DIAGNOSIS — G453 Amaurosis fugax: Secondary | ICD-10-CM | POA: Diagnosis not present

## 2022-07-20 NOTE — Progress Notes (Signed)
[facility] [address] [place]. [place] [address] [phone]       OFFICE FOLLOW-UP NOTE  Garrett Moore Date of Birth:  [birth year] Medical Record Number:  [id number]   HPI: Garrett Moore is a 82 year old pleasant Caucasian male seen today for initial office follow-up visit following hospital consultation for vision loss in February 2024.  He has past medical history of coronary artery disease, MI, peripheral vascular disease, benign prostatic hypertrophy who presented on 05/19/2022 with brief episode of sudden onset of painless vision loss in the left eye.  He noted that his left eye vision was completely dark after he closes his right eye.  Lasted about a minute and then resolved.  He has a history of retinal detachment in the right eye and sees an ophthalmologist for blurred vision in the right eye CT head was unremarkable.  MRI scan showed no acute infarct.  Carotid ultrasound showed chronic left carotid occlusion.  Echocardiogram in 07/06/2022 showed ejection fraction 60 to 65%.  LDL cholesterol was 60 mg percent.  Hemoglobin A1c was 6.0.  Patient also ordered external heart monitor from 05/25/2022 to 06/18/2022 which did not show any A-fib but showed some runs of SVT.  Patient had a diagnosis of A-fib listed in his chart but he vehimently denies this and states he was never told this or asked to take anticoagulation. He states is done well since then has not had any vision loss episodes.  He denies any prior history of known stroke/TIA.  He is tolerating Plavix well but does have bleeding bruising.  Tolerating Zocor well without muscle aches and pains.  His blood pressure is well-controlled today it is 137/64.  He has no new complaints. n. ROS:   14 system review of systems is positive for vision loss, bruising only all other systems negative  PMH:  Past Medical History:  Diagnosis Date   Aneurysm of infrarenal abdominal aorta (HCC) 02/08/2018   a.) TTE 02/08/2018: Ao  root 38 mm, asc Ao 40 mm; b.) TTE 06/14/2020; asc Ao 28 mm; c.) CT hematuria 05/10/2020: infrarenal AAA measuring 5.9 cm; d.) s/p EVAR 07/03/2020; e.) CT A/P 10/14/2021: residual aneurysmal sac (s/p EVAR) measured 6.0 cm   Aortic atherosclerosis (HCC)    Aortic valvar stenosis 07/21/2012   a.) TTE 07/21/2012: EF 50-55%, mild AS (AVA = 1.67, MPG 9); b.) TTE 02/08/2018: EF 60-65%; mod AS (MPG 17); c.) TTE 06/14/2020: EF 60-65%, mod AS (MPG 26.8)   Atherosclerosis of native arteries of extremity with intermittent claudication (HCC)    Basal cell carcinoma, face    BPH (benign prostatic hyperplasia)    Carotid artery stenosis 05/21/2006   a.) carotid US 05/21/2006: 0-49% BICA; b.) carotid US 06/10/2020: 1-39% RICA, total occ LICA   CKD (chronic kidney disease), stage III (HCC)    COPD (chronic obstructive pulmonary disease) (HCC)    Coronary artery disease 08/14/1983   a.) MI --> LHC 08/13/2016: CTO of the RCA --> PTCA performed resulting in 30% residual stenosis; aberrant takeoff of his RCA coming off somewhat high and posteriorly.   Diastolic dysfunction 02/08/2018   a.) TTE 02/08/2018: EF 60-65%, mild MAC,  triv TR, mild-mod AS, G1DD; b.) TTE 06/14/2020: EF 60-65%, mod AS, triv TR, mild AR, G1DD   Diverticulosis    Family history of adverse reaction to anesthesia    a.) extended GA course for mother --> postoperaitve memory problems/dementia   GERD (gastroesophageal reflux disease)    Glaucoma  Heart murmur    History of kidney stones    Hyperlipidemia    Iliac artery aneurysm, bilateral (HCC) 07/03/2020   a.) s/p insertion of BILATERAL iliac artery stents   Myocardial infarction (HCC) 08/13/2016   a.) MI --> LHC 08/13/2016: CTO of the RCA --> PTCA performed resulting in 30% residual stenosis. Procedure complicated by procedural nausea, Patient went into IVR and then AV disaccociated rhythm. SBP dropped (80s). TVP floated with capture, which stabilized patient. ICU admission overnight.    OA (osteoarthritis)    Peripheral vascular disease (HCC)    Postoperative deep vein thrombosis (DVT) (HCC)    a.) postop age 60 following tonsillectomy (per patient report)    Social History:  Social History   Socioeconomic History   Marital status: Married    Spouse name: Britta Mccreedy   Number of children: 2   Years of education: Not on file   Highest education level: Not on file  Occupational History   Occupation: Production designer, theatre/television/film of [employer]    Comment: retired  Tobacco Use   Smoking status: Former    Packs/day: 2.00    Years: 50.00    Additional pack years: 0.00    Total pack years: 100.00    Types: Cigarettes    Quit date: 2004    Years since quitting: 20.3   Smokeless tobacco: Never   Tobacco comments:    quit 17 years ago  Vaping Use   Vaping Use: Never used  Substance and Sexual Activity   Alcohol use: Not Currently   Drug use: Never   Sexual activity: Not on file  Other Topics Concern   Not on file  Social History Narrative   Patient lives with wife in his home.  He feels safe in his home.   No stairs.    Social Determinants of Health   Financial Resource Strain: Not on file  Food Insecurity: No Food Insecurity (05/20/2022)   Hunger Vital Sign    Worried About Running Out of Food in the Last Year: Never true    Ran Out of Food in the Last Year: Never true  Transportation Needs: No Transportation Needs (05/20/2022)   PRAPARE - Administrator, Civil Service (Medical): No    Lack of Transportation (Non-Medical): No  Physical Activity: Not on file  Stress: Not on file  Social Connections: Not on file  Intimate Partner Violence: Not At Risk (05/20/2022)   Humiliation, Afraid, Rape, and Kick questionnaire    Fear of Current or Ex-Partner: No    Emotionally Abused: No    Physically Abused: No    Sexually Abused: No    Medications:   Current Outpatient Medications on File Prior to Visit  Medication Sig Dispense Refill   clopidogrel (PLAVIX) 75 MG  tablet Take 1 tablet (75 mg total) by mouth daily. 90 tablet 3   Multiple Vitamin (MULTIVITAMIN WITH MINERALS) TABS tablet Take 1 tablet by mouth daily.     nicotine polacrilex (COMMIT) 2 MG lozenge Take 2 mg by mouth as needed for smoking cessation.     simvastatin (ZOCOR) 80 MG tablet Take 80 mg by mouth at bedtime.     No current facility-administered medications on file prior to visit.    Allergies:   Allergies  Allergen Reactions   Quinolones Other (See Comments)    Other reaction(s): Has aortic root dilatation   Sulfa Antibiotics Rash    Physical Exam General: Pleasant elderly Caucasian male well developed, well nourished, seated, in  no evident distress Head: head normocephalic and atraumatic.  Neck: supple with no carotid or supraclavicular bruits Cardiovascular: regular rate and rhythm, no murmurs Musculoskeletal: no deformity Skin:  no rash/petichiae Vascular:  Normal pulses all extremities Vitals:   07/20/22 0939  BP: 137/64  Pulse: 75   Neurologic Exam Mental Status: Awake and fully alert. Oriented to place and time. Recent and remote memory intact. Attention span, concentration and fund of knowledge appropriate. Mood and affect appropriate.  Cranial Nerves: Fundoscopic exam reveals sharp disc margins. Pupils equal, briskly reactive to light. Extraocular movements full without nystagmus. Visual fields full to confrontation. Hearing i slightly diminished bilaterally. Facial sensation intact. Face, tongue, palate moves normally and symmetrically.  Motor: Normal bulk and tone. Normal strength in all tested extremity muscles. Sensory.: intact to touch ,pinprick .position and vibratory sensation.  Coordination: Rapid alternating movements normal in all extremities. Finger-to-nose and heel-to-shin performed accurately bilaterally. Gait and Station: Arises from chair without difficulty. Stance is normal. Gait demonstrates normal stride length and balance . Able to heel, toe and  tandem walk with moderate difficulty.  Reflexes: 1+ and symmetric. Toes downgoing.   NIHSS  0 Modified Rankin  0   ASSESSMENT: 82 year old Caucasian male with 2 episodes of transient left eye vision loss likely amaurosis fugax with known chronic left carotid occlusion.  Vascular risk factors carotid disease, hyperlipidemia and age     PLAN:I had a long d/w patient about his recent episodes of transient vision loss in the left eye, chronic left carotid occlusion, risk for recurrent stroke/TIAs, personally independently reviewed imaging studies and stroke evaluation results and answered questions.Continue Plavix (clopidogrel) 75 mg daily  for secondary stroke prevention and maintain strict control of hypertension with blood pressure goal below 130/90, diabetes with hemoglobin A1c goal below 6.5% and lipids with LDL cholesterol goal below 70 mg/dL. I also advised the patient to eat a healthy diet with plenty of whole grains, cereals, fruits and vegetables, exercise regularly and maintain ideal body weight Followup in the future with me in 1 year or call earlier if necessary. Greater than 50% of time during this 35 minute visit was spent on counseling,explanation of diagnosis vision loss, carotid occlusion, planning of further management, discussion with patient and family and coordination of care Delia Heady, MD Note: This document was prepared with digital dictation and possible smart phrase technology. Any transcriptional errors that result from this process are unintentional

## 2022-07-20 NOTE — Patient Instructions (Signed)
I had a long d/w patient about his recent episodes of transient vision loss in the left eye, chronic left carotid occlusion, risk for recurrent stroke/TIAs, personally independently reviewed imaging studies and stroke evaluation results and answered questions.Continue Plavix (clopidogrel) 75 mg daily  for secondary stroke prevention and maintain strict control of hypertension with blood pressure goal below 130/90, diabetes with hemoglobin A1c goal below 6.5% and lipids with LDL cholesterol goal below 70 mg/dL. I also advised the patient to eat a healthy diet with plenty of whole grains, cereals, fruits and vegetables, exercise regularly and maintain ideal body weight Followup in the future with me in 1 year or call earlier if necessary.  Stroke Prevention Some medical conditions and behaviors can lead to a higher chance of having a stroke. You can help prevent a stroke by eating healthy, exercising, not smoking, and managing any medical conditions you have. Stroke is a leading cause of functional impairment. Primary prevention is particularly important because a majority of strokes are first-time events. Stroke changes the lives of not only those who experience a stroke but also their family and other caregivers. How can this condition affect me? A stroke is a medical emergency and should be treated right away. A stroke can lead to brain damage and can sometimes be life-threatening. If a person gets medical treatment right away, there is a better chance of surviving and recovering from a stroke. What can increase my risk? The following medical conditions may increase your risk of a stroke: Cardiovascular disease. High blood pressure (hypertension). Diabetes. High cholesterol. Sickle cell disease. Blood clotting disorders (hypercoagulable state). Obesity. Sleep disorders (obstructive sleep apnea). Other risk factors include: Being older than age 41. Having a history of blood clots, stroke, or  mini-stroke (transient ischemic attack, TIA). Genetic factors, such as race, ethnicity, or a family history of stroke. Smoking cigarettes or using other tobacco products. Taking birth control pills, especially if you also use tobacco. Heavy use of alcohol or drugs, especially cocaine and methamphetamine. Physical inactivity. What actions can I take to prevent this? Manage your health conditions High cholesterol levels. Eating a healthy diet is important for preventing high cholesterol. If cholesterol cannot be managed through diet alone, you may need to take medicines. Take any prescribed medicines to control your cholesterol as told by your health care provider. Hypertension. To reduce your risk of stroke, try to keep your blood pressure below 130/80. Eating a healthy diet and exercising regularly are important for controlling blood pressure. If these steps are not enough to manage your blood pressure, you may need to take medicines. Take any prescribed medicines to control hypertension as told by your health care provider. Ask your health care provider if you should monitor your blood pressure at home. Have your blood pressure checked every year, even if your blood pressure is normal. Blood pressure increases with age and some medical conditions. Diabetes. Eating a healthy diet and exercising regularly are important parts of managing your blood sugar (glucose). If your blood sugar cannot be managed through diet and exercise, you may need to take medicines. Take any prescribed medicines to control your diabetes as told by your health care provider. Get evaluated for obstructive sleep apnea. Talk to your health care provider about getting a sleep evaluation if you snore a lot or have excessive sleepiness. Make sure that any other medical conditions you have, such as atrial fibrillation or atherosclerosis, are managed. Nutrition Follow instructions from your health care provider about what to  eat or drink to help manage your health condition. These instructions may include: Reducing your daily calorie intake. Limiting how much salt (sodium) you use to 1,500 milligrams (mg) each day. Using only healthy fats for cooking, such as olive oil, canola oil, or sunflower oil. Eating healthy foods. You can do this by: Choosing foods that are high in fiber, such as whole grains, and fresh fruits and vegetables. Eating at least 5 servings of fruits and vegetables a day. Try to fill one-half of your plate with fruits and vegetables at each meal. Choosing lean protein foods, such as lean cuts of meat, poultry without skin, fish, tofu, beans, and nuts. Eating low-fat dairy products. Avoiding foods that are high in sodium. This can help lower blood pressure. Avoiding foods that have saturated fat, trans fat, and cholesterol. This can help prevent high cholesterol. Avoiding processed and prepared foods. Counting your daily carbohydrate intake.  Lifestyle If you drink alcohol: Limit how much you have to: 0-1 drink a day for women who are not pregnant. 0-2 drinks a day for men. Know how much alcohol is in your drink. In the [place] one drink equals one 12 oz bottle of beer ( ), one 5 oz glass of wine ( ), or one 1 oz glass of hard liquor (85mL). Do not use any products that contain nicotine or tobacco. These products include cigarettes, chewing tobacco, and vaping devices, such as e-cigarettes. If you need help quitting, ask your health care provider. Avoid secondhand smoke. Do not use drugs. Activity  Try to stay at a healthy weight. Get at least 30 minutes of exercise on most days, such as: Fast walking. Biking. Swimming. Medicines Take over-the-counter and prescription medicines only as told by your health care provider. Aspirin or blood thinners (antiplatelets or anticoagulants) may be recommended to reduce your risk of forming blood clots that can lead to stroke. Avoid taking  birth control pills. Talk to your health care provider about the risks of taking birth control pills if: You are over 50 years old. You smoke. You get very bad headaches. You have had a blood clot. Where to find more information American Stroke Association: www.strokeassociation.org Get help right away if: You or a loved one has any symptoms of a stroke. "BE FAST" is an easy way to remember the main warning signs of a stroke: B - Balance. Signs are dizziness, sudden trouble walking, or loss of balance. E - Eyes. Signs are trouble seeing or a sudden change in vision. F - Face. Signs are sudden weakness or numbness of the face, or the face or eyelid drooping on one side. A - Arms. Signs are weakness or numbness in an arm. This happens suddenly and usually on one side of the body. S - Speech. Signs are sudden trouble speaking, slurred speech, or trouble understanding what people say. T - Time. Time to call emergency services. Write down what time symptoms started. You or a loved one has other signs of a stroke, such as: A sudden, severe headache with no known cause. Nausea or vomiting. Seizure. These symptoms may represent a serious problem that is an emergency. Do not wait to see if the symptoms will go away. Get medical help right away. Call your local emergency services (911 in the [place] Do not drive yourself to the hospital. Summary You can help to prevent a stroke by eating healthy, exercising, not smoking, limiting alcohol intake, and managing any medical conditions you may have. Do not use any products that  contain nicotine or tobacco. These include cigarettes, chewing tobacco, and vaping devices, such as e-cigarettes. If you need help quitting, ask your health care provider. Remember "BE FAST" for warning signs of a stroke. Get help right away if you or a loved one has any of these signs. This information is not intended to replace advice given to you by your health care provider. Make  sure you discuss any questions you have with your health care provider. Document Revised: 09/21/2019 Document Reviewed: 10/09/2019 Elsevier Patient Education  2023 [employer].

## 2022-07-28 ENCOUNTER — Ambulatory Visit: Payer: Medicare HMO | Attending: Family Medicine | Admitting: Physical Therapy

## 2022-07-28 ENCOUNTER — Encounter: Payer: Self-pay | Admitting: Physical Therapy

## 2022-07-28 DIAGNOSIS — R2689 Other abnormalities of gait and mobility: Secondary | ICD-10-CM | POA: Diagnosis not present

## 2022-07-28 DIAGNOSIS — R269 Unspecified abnormalities of gait and mobility: Secondary | ICD-10-CM | POA: Diagnosis not present

## 2022-07-28 DIAGNOSIS — M6281 Muscle weakness (generalized): Secondary | ICD-10-CM | POA: Diagnosis not present

## 2022-07-28 DIAGNOSIS — R262 Difficulty in walking, not elsewhere classified: Secondary | ICD-10-CM | POA: Diagnosis not present

## 2022-07-28 NOTE — Therapy (Signed)
OUTPATIENT PHYSICAL THERAPY NEURO EVALUATION    Patient Name: Garrett Moore MRN: [id number] DOB:1940/07/21, 82 y.o., male Today's Date: 07/28/2022   PCP: Daisy Floro, MD  REFERRING PROVIDER: Daisy Floro, MD   END OF SESSION:  PT End of Session - 07/28/22 1319     Visit Number 1    Number of Visits 16    Date for PT Re-Evaluation 09/22/22    Progress Note Due on Visit 10    PT Start Time 1017    PT Stop Time 1059    PT Time Calculation (min) 42 min    Equipment Utilized During Treatment Gait belt    Activity Tolerance Patient tolerated treatment well    Behavior During Therapy [facility] for tasks assessed/performed             Past Medical History:  Diagnosis Date   Aneurysm of infrarenal abdominal aorta (HCC) 02/08/2018   a.) TTE 02/08/2018: Ao root 38 mm, asc Ao 40 mm; b.) TTE 06/14/2020; asc Ao 28 mm; c.) CT hematuria 05/10/2020: infrarenal AAA measuring 5.9 cm; d.) s/p EVAR 07/03/2020; e.) CT A/P 10/14/2021: residual aneurysmal sac (s/p EVAR) measured 6.0 cm   Aortic atherosclerosis (HCC)    Aortic valvar stenosis 07/21/2012   a.) TTE 07/21/2012: EF 50-55%, mild AS (AVA = 1.67, MPG 9); b.) TTE 02/08/2018: EF 60-65%; mod AS (MPG 17); c.) TTE 06/14/2020: EF 60-65%, mod AS (MPG 26.8)   Atherosclerosis of native arteries of extremity with intermittent claudication (HCC)    Basal cell carcinoma, face    BPH (benign prostatic hyperplasia)    Carotid artery stenosis 05/21/2006   a.) carotid US 05/21/2006: 0-49% BICA; b.) carotid US 06/10/2020: 1-39% RICA, total occ LICA   CKD (chronic kidney disease), stage III (HCC)    COPD (chronic obstructive pulmonary disease) (HCC)    Coronary artery disease 08/14/1983   a.) MI --> LHC 08/13/2016: CTO of the RCA --> PTCA performed resulting in 30% residual stenosis; aberrant takeoff of his RCA coming off somewhat high and posteriorly.   Diastolic dysfunction 02/08/2018   a.) TTE 02/08/2018: EF 60-65%, mild MAC,  triv TR,  mild-mod AS, G1DD; b.) TTE 06/14/2020: EF 60-65%, mod AS, triv TR, mild AR, G1DD   Diverticulosis    Family history of adverse reaction to anesthesia    a.) extended GA course for mother --> postoperaitve memory problems/dementia   GERD (gastroesophageal reflux disease)    Glaucoma    Heart murmur    History of kidney stones    Hyperlipidemia    Iliac artery aneurysm, bilateral (HCC) 07/03/2020   a.) s/p insertion of BILATERAL iliac artery stents   Myocardial infarction (HCC) 08/13/2016   a.) MI --> LHC 08/13/2016: CTO of the RCA --> PTCA performed resulting in 30% residual stenosis. Procedure complicated by procedural nausea, Patient went into IVR and then AV disaccociated rhythm. SBP dropped (80s). TVP floated with capture, which stabilized patient. ICU admission overnight.   OA (osteoarthritis)    Peripheral vascular disease (HCC)    Postoperative deep vein thrombosis (DVT) (HCC)    a.) postop age 44 following tonsillectomy (per patient report)   Past Surgical History:  Procedure Laterality Date   CATARACT EXTRACTION, BILATERAL  01/2019   CORONARY ANGIOPLASTY  08/14/1983   Procedure: CORONARY ANGIOPLASTY (PTCA pRCA); Location: [facility]; Surgeon: Al Little, MD   CYSTOSCOPY W/ RETROGRADES  08/12/2020   Procedure: CYSTOSCOPY WITH RETROGRADE PYELOGRAM;  Surgeon: Vanna Scotland, MD;  Location: [facility];  Service: Urology;;   CYSTOSCOPY WITH INSERTION OF UROLIFT N/A 12/22/2021   Procedure: CYSTOSCOPY WITH INSERTION OF UROLIFT;  Surgeon: Vanna Scotland, MD;  Location: [facility];  Service: Urology;  Laterality: N/A;   CYSTOSCOPY/URETEROSCOPY/HOLMIUM LASER/STENT PLACEMENT Left 08/12/2020   Procedure: CYSTOSCOPY/URETEROSCOPY/HOLMIUM LASER/STENT PLACEMENT;  Surgeon: Vanna Scotland, MD;  Location: [facility];  Service: Urology;  Laterality: Left;   ENDOVASCULAR REPAIR/STENT GRAFT N/A 07/03/2020   Procedure: ENDOVASCULAR REPAIR/STENT GRAFT;  Surgeon: Renford Dills, MD;  Location: [facility]  INVASIVE CV LAB;  Service: Cardiovascular;  Laterality: N/A;   PELVIC ANGIOGRAPHY Right 10/14/2021   Procedure: PELVIC ANGIOGRAPHY;  Surgeon: Renford Dills, MD;  Location: [facility] INVASIVE CV LAB;  Service: Cardiovascular;  Laterality: Right;   PERIPHERAL VASCULAR THROMBECTOMY Right 10/14/2021   Procedure: PERIPHERAL VASCULAR THROMBECTOMY;  Surgeon: Renford Dills, MD;  Location: [facility] INVASIVE CV LAB;  Service: Cardiovascular;  Laterality: Right;   ROTATOR CUFF REPAIR Left    TEMPORARY PACEMAKER  08/14/1983   Procedure: TEMPORARY PACEMAKER PLACEMENT; Location: [facility]; Surgeon: Al Little, MD   TONSILLECTOMY     TOTAL HIP ARTHROPLASTY Left 02/21/2019   Procedure: TOTAL HIP ARTHROPLASTY ANTERIOR APPROACH;  Surgeon: Durene Romans, MD;  Location: [facility];  Service: Orthopedics;  Laterality: Left;  70 mins   VITRECTOMY Bilateral    Patient Active Problem List   Diagnosis Date Noted   Moderate aortic stenosis 05/20/2022   Chronic diastolic CHF (congestive heart failure) (HCC) 05/20/2022   Paroxysmal atrial fibrillation (HCC) 05/20/2022   Vision loss of left eye 05/20/2022   Acute ischemic stroke (HCC) 05/19/2022   Atherosclerosis of native arteries of extremity with intermittent claudication (HCC) 09/28/2021   Aortic valve disease 08/05/2020   Atherosclerotic heart disease of native coronary artery without angina pectoris 08/05/2020   Basal cell carcinoma of skin 08/05/2020   Benign prostatic hyperplasia 08/05/2020   Stage 3a chronic kidney disease (CKD) (HCC) 08/05/2020   COPD (chronic obstructive pulmonary disease) (HCC) 08/05/2020   Frequent fecal incontinence 08/05/2020   Glaucoma 08/05/2020   Hyperlipidemia 08/05/2020   Other long term (current) drug therapy 08/05/2020   AAA (abdominal aortic aneurysm) (HCC) 07/03/2020   AAA (abdominal aortic aneurysm) without rupture (HCC) 05/26/2020   S/P left THA, AA 02/21/2019   Status post total hip replacement, left 02/21/2019     ONSET DATE: 08/21/21  REFERRING DIAG: R26.89 (ICD-10-CM) - Balance problem   THERAPY DIAG:  Abnormality of gait and mobility  Difficulty in walking, not elsewhere classified  Muscle weakness (generalized)  Other abnormalities of gait and mobility  Rationale for Evaluation and Treatment: Rehabilitation  SUBJECTIVE:  SUBJECTIVE STATEMENT: Pt reports he noticed he has trouble when he first gets up and he tends to stumble, he recalls no dizziness when he gets up or lightheadedness. He recalls no falls in the last 6 months but has had many stumbles where he has caught himself. Pt went to ER following 1 minute of visual changes where he could not see out of his left eye, no changes in his visual field when he was just looking out of his right eye. No significant findings of stroke per ER report and notes and per pt report. Eye doctor also does not know why the acute visual change occurred.  Pt main goal for PT is to improve his balance. Pt reports he favors his left leg per report of observers.  Pt accompanied by: self  PERTINENT HISTORY: Pt reports he noticed he has trouble when he first gets up and he tends to stumble, he recalls no dizziness when he gets up or lightheadedness. He recalls no falls in the last 6 months but has had many stumbles where he has caught himself. Pt went to ER following 1 minute of visual changes where he could not see out of his left eye, no changes in his visual field when he was just looking out of his right eye. No significant findings of stroke per ER report and notes and per pt report. Eye doctor also does not know why the acute visual change occurred.  Pt main goal for PT is to improve his balance. Pt reports he favors his left leg per report of observers.   PAIN:  Are you  having pain? No  PRECAUTIONS: None  WEIGHT BEARING RESTRICTIONS: No  FALLS: Has patient fallen in last 6 months? No  LIVING ENVIRONMENT: Lives with: lives with their family and lives with their spouse Lives in: House/apartment Stairs: No Has following equipment at home: Single point cane and only uses cane if his legs are stiff on a given day.   PLOF: Independent  PATIENT GOALS: Improve his balance  OBJECTIVE: (objective measures completed at initial evaluation unless otherwise dated)   DIAGNOSTIC FINDINGS: N/A, no acute findings for CVA or other abnormalities at ER in February   COGNITION: Overall cognitive status: Within functional limits for tasks assessed   SENSATION: Not tested  COORDINATION: WNL   POSTURE: No Significant postural limitations   LOWER EXTREMITY MMT:    MMT Right Eval Left Eval  Hip flexion 5 5  Hip extension    Hip abduction 5 5  Hip adduction 5 5  Hip internal rotation 5 5  Hip external rotation 5 5  Knee flexion 5 5  Knee extension 4+ 5  Ankle dorsiflexion 5 5  Ankle plantarflexion 4+ 4+  Ankle inversion    Ankle eversion    (Blank rows = not tested) All tested in seated position   STAIRS: Level of Assistance: Complete Independence Stair Negotiation Technique: Alternating Pattern  with No Rails Number of Stairs: 4  Height of Stairs: 6 in  Comments: some hesitancy with descending steps   GAIT: Gait pattern:  With quick turnaround and pivot turn patient demonstrates poor single-leg stance stability and also utilizes several narrow base of support steps to recover balance from this acute loss of balance.  and step through pattern Distance walked: 30 ft Assistive device utilized: None Level of assistance: Complete Independence Comments: Difficulty with more dynamic balance tasks with simple ambulation no significant errors or limitations.  FUNCTIONAL TESTS:  5 times sit to  stand: 15.77 sec  MiniBEST   [facility] PT Assessment -  07/28/22 0001       Standardized Balance Assessment   Standardized Balance Assessment Mini-BESTest      Mini-BESTest   Sit To Stand Normal: Comes to stand without use of hands and stabilizes independently.    Rise to Toes Moderate: Heels up, but not full range (smaller than when holding hands), OR noticeable instability for 3 s.    Stand on one leg (left) Moderate: < 20 s    Stand on one leg (right) Moderate: < 20 s    Stand on one leg - lowest score 1    Compensatory Stepping Correction - Forward Normal: Recovers independently with a single, large step (second realignement is allowed).    Compensatory Stepping Correction - Backward Moderate: More than one step is required to recover equilibrium    Compensatory Stepping Correction - Left Lateral Normal: Recovers independently with 1 step (crossover or lateral OK)    Compensatory Stepping Correction - Right Lateral Normal: Recovers independently with 1 step (crossover or lateral OK)    Stepping Corredtion Lateral - lowest score 2    Stance - Feet together, eyes open, firm surface  Moderate: < 30s    Stance - Feet together, eyes closed, foam surface  Moderate: < 30s    Incline - Eyes Closed Moderate: Stands independently < 30s OR aligns with surface    Change in Gait Speed Normal: Significantly changes walkling speed without imbalance    Walk with head turns - Horizontal Moderate: performs head turns with reduction in gait speed.    Walk with pivot turns Severe: Cannot turn with feet close at any speed without imbalance.    Step over obstacles Moderate: Steps over box but touches box OR displays cautious behavior by slowing gait.    Timed UP & GO with Dual Task Moderate: Dual Task affects either counting OR walking (>10%) when compared to the TUG without Dual Task.   10.76, difficulty with turn (legs tangled but corrected), 13.47 dual task   Mini-BEST total score 17              PATIENT SURVEYS:  FOTO 63, risk adjusted goal of 64 (  will set goal higher to ensure legitimate change but will be lofty goal due to high initial score)   TODAY'S TREATMENT:                                                                                                                              DATE: EVAL ONLY    PATIENT EDUCATION: Education details: POC Person educated: Patient Education method: Explanation Education comprehension: verbalized understanding  HOME EXERCISE PROGRAM: Establish visit 2   GOALS: Goals reviewed with patient? Yes  SHORT TERM GOALS: Target date: 08/25/2022      Patient will be independent in home exercise program to improve strength/mobility for better functional independence with ADLs. Baseline: No HEP currently  Goal status: INITIAL     LONG TERM GOALS: Target date: 10/20/2022   1.  Patient (> 59 years old) will complete five times sit to stand test in < 13 seconds indicating an increased LE strength and improved balance. Baseline: 15.77 sec Goal status: INITIAL  2.  Patient will increase FOTO score to equal to or greater than  68   to demonstrate statistically significant improvement in mobility and quality of life.  Baseline: 63 Goal status: INITIAL   3.  Patient will increase mini BEST Balance score by > 4 points to demonstrate decreased fall risk during functional activities. Baseline: 17 Goal status: INITIAL      ASSESSMENT:  CLINICAL IMPRESSION: Patient is a 82 y.o. M who was seen today for physical therapy evaluation and treatment for concerns with impaired balance that he has noticed over the last year or so.  Patient presents with many deficits with many best balance test showing deficits with single-leg stance, as well as static balance when performing various challenging activities such as standing on compliant surface, limiting visual input, or standing on uneven surface such as an incline.  Patient also shows difficulty with quick reactions when ambulating as evidenced by near  loss of balance when performing a pivot and change of direction with ambulation.  Patient does show good step reactions and performing righting strategies following a loss of balance which is likely why patient has not fallen to this point patient's ankle and hip strategies are not yet adequate for printing center of masturbating base of support causing frequent loss of balance.  Will work on initially higher challenging narrow base of support or limiting base of support exercises to work on keeping center of mass within base of support for improved balance.  Patient will benefit risk of physical therapy to address the above impairments, and improve his quality of life.  OBJECTIVE IMPAIRMENTS: Abnormal gait and decreased balance.   ACTIVITY LIMITATIONS: standing, squatting, and stairs  PARTICIPATION LIMITATIONS: community activity and yard work  PERSONAL FACTORS: Age, Time since onset of injury/illness/exacerbation, and 3+ comorbidities: THA, CKD, HLD, Atherosclerosis  are also affecting patient's functional outcome.   REHAB POTENTIAL: Excellent  CLINICAL DECISION MAKING: Stable/uncomplicated  EVALUATION COMPLEXITY: Low  PLAN:  PT FREQUENCY: 2x/week  PT DURATION: 12 weeks  PLANNED INTERVENTIONS: Therapeutic exercises, Therapeutic activity, Neuromuscular re-education, Balance training, Gait training, Patient/Family education, Self Care, Joint mobilization, Manual therapy, and Re-evaluation  PLAN FOR NEXT SESSION: Establish HEP, high level static balance activities and progression to dynamic as appropriate.    Norman Herrlich, PT 07/28/2022, 1:20 PM

## 2022-07-29 ENCOUNTER — Ambulatory Visit: Payer: Medicare HMO | Admitting: Physical Therapy

## 2022-07-29 DIAGNOSIS — R262 Difficulty in walking, not elsewhere classified: Secondary | ICD-10-CM | POA: Diagnosis not present

## 2022-07-29 DIAGNOSIS — M6281 Muscle weakness (generalized): Secondary | ICD-10-CM

## 2022-07-29 DIAGNOSIS — R269 Unspecified abnormalities of gait and mobility: Secondary | ICD-10-CM

## 2022-07-29 DIAGNOSIS — R2689 Other abnormalities of gait and mobility: Secondary | ICD-10-CM

## 2022-07-29 NOTE — Therapy (Signed)
OUTPATIENT PHYSICAL THERAPY NEURO treatment   Patient Name: Garrett Moore MRN: [id number] DOB:Oct 27, 1940, 82 y.o., male Today's Date: 07/29/2022   PCP: Daisy Floro, MD  REFERRING PROVIDER: Daisy Floro, MD   END OF SESSION:  PT End of Session - 07/29/22 1057     Visit Number 2    Number of Visits 16    Date for PT Re-Evaluation 09/22/22    Progress Note Due on Visit 10    PT Start Time 1101    PT Stop Time 1143    PT Time Calculation (min) 42 min    Equipment Utilized During Treatment Gait belt    Activity Tolerance Patient tolerated treatment well    Behavior During Therapy [facility] for tasks assessed/performed              Past Medical History:  Diagnosis Date   Aneurysm of infrarenal abdominal aorta (HCC) 02/08/2018   a.) TTE 02/08/2018: Ao root 38 mm, asc Ao 40 mm; b.) TTE 06/14/2020; asc Ao 28 mm; c.) CT hematuria 05/10/2020: infrarenal AAA measuring 5.9 cm; d.) s/p EVAR 07/03/2020; e.) CT A/P 10/14/2021: residual aneurysmal sac (s/p EVAR) measured 6.0 cm   Aortic atherosclerosis (HCC)    Aortic valvar stenosis 07/21/2012   a.) TTE 07/21/2012: EF 50-55%, mild AS (AVA = 1.67, MPG 9); b.) TTE 02/08/2018: EF 60-65%; mod AS (MPG 17); c.) TTE 06/14/2020: EF 60-65%, mod AS (MPG 26.8)   Atherosclerosis of native arteries of extremity with intermittent claudication (HCC)    Basal cell carcinoma, face    BPH (benign prostatic hyperplasia)    Carotid artery stenosis 05/21/2006   a.) carotid US 05/21/2006: 0-49% BICA; b.) carotid US 06/10/2020: 1-39% RICA, total occ LICA   CKD (chronic kidney disease), stage III (HCC)    COPD (chronic obstructive pulmonary disease) (HCC)    Coronary artery disease 08/14/1983   a.) MI --> LHC 08/13/2016: CTO of the RCA --> PTCA performed resulting in 30% residual stenosis; aberrant takeoff of his RCA coming off somewhat high and posteriorly.   Diastolic dysfunction 02/08/2018   a.) TTE 02/08/2018: EF 60-65%, mild MAC,  triv TR,  mild-mod AS, G1DD; b.) TTE 06/14/2020: EF 60-65%, mod AS, triv TR, mild AR, G1DD   Diverticulosis    Family history of adverse reaction to anesthesia    a.) extended GA course for mother --> postoperaitve memory problems/dementia   GERD (gastroesophageal reflux disease)    Glaucoma    Heart murmur    History of kidney stones    Hyperlipidemia    Iliac artery aneurysm, bilateral (HCC) 07/03/2020   a.) s/p insertion of BILATERAL iliac artery stents   Myocardial infarction (HCC) 08/13/2016   a.) MI --> LHC 08/13/2016: CTO of the RCA --> PTCA performed resulting in 30% residual stenosis. Procedure complicated by procedural nausea, Patient went into IVR and then AV disaccociated rhythm. SBP dropped (80s). TVP floated with capture, which stabilized patient. ICU admission overnight.   OA (osteoarthritis)    Peripheral vascular disease (HCC)    Postoperative deep vein thrombosis (DVT) (HCC)    a.) postop age 57 following tonsillectomy (per patient report)   Past Surgical History:  Procedure Laterality Date   CATARACT EXTRACTION, BILATERAL  01/2019   CORONARY ANGIOPLASTY  08/14/1983   Procedure: CORONARY ANGIOPLASTY (PTCA pRCA); Location: [facility]; Surgeon: Al Little, MD   CYSTOSCOPY W/ RETROGRADES  08/12/2020   Procedure: CYSTOSCOPY WITH RETROGRADE PYELOGRAM;  Surgeon: Vanna Scotland, MD;  Location: [facility];  Service: Urology;;   CYSTOSCOPY WITH INSERTION OF UROLIFT N/A 12/22/2021   Procedure: CYSTOSCOPY WITH INSERTION OF UROLIFT;  Surgeon: Vanna Scotland, MD;  Location: [facility];  Service: Urology;  Laterality: N/A;   CYSTOSCOPY/URETEROSCOPY/HOLMIUM LASER/STENT PLACEMENT Left 08/12/2020   Procedure: CYSTOSCOPY/URETEROSCOPY/HOLMIUM LASER/STENT PLACEMENT;  Surgeon: Vanna Scotland, MD;  Location: [facility];  Service: Urology;  Laterality: Left;   ENDOVASCULAR REPAIR/STENT GRAFT N/A 07/03/2020   Procedure: ENDOVASCULAR REPAIR/STENT GRAFT;  Surgeon: Renford Dills, MD;  Location: [facility]  INVASIVE CV LAB;  Service: Cardiovascular;  Laterality: N/A;   PELVIC ANGIOGRAPHY Right 10/14/2021   Procedure: PELVIC ANGIOGRAPHY;  Surgeon: Renford Dills, MD;  Location: [facility] INVASIVE CV LAB;  Service: Cardiovascular;  Laterality: Right;   PERIPHERAL VASCULAR THROMBECTOMY Right 10/14/2021   Procedure: PERIPHERAL VASCULAR THROMBECTOMY;  Surgeon: Renford Dills, MD;  Location: [facility] INVASIVE CV LAB;  Service: Cardiovascular;  Laterality: Right;   ROTATOR CUFF REPAIR Left    TEMPORARY PACEMAKER  08/14/1983   Procedure: TEMPORARY PACEMAKER PLACEMENT; Location: [facility]; Surgeon: Al Little, MD   TONSILLECTOMY     TOTAL HIP ARTHROPLASTY Left 02/21/2019   Procedure: TOTAL HIP ARTHROPLASTY ANTERIOR APPROACH;  Surgeon: Durene Romans, MD;  Location: [facility];  Service: Orthopedics;  Laterality: Left;  70 mins   VITRECTOMY Bilateral    Patient Active Problem List   Diagnosis Date Noted   Moderate aortic stenosis 05/20/2022   Chronic diastolic CHF (congestive heart failure) (HCC) 05/20/2022   Paroxysmal atrial fibrillation (HCC) 05/20/2022   Vision loss of left eye 05/20/2022   Acute ischemic stroke (HCC) 05/19/2022   Atherosclerosis of native arteries of extremity with intermittent claudication (HCC) 09/28/2021   Aortic valve disease 08/05/2020   Atherosclerotic heart disease of native coronary artery without angina pectoris 08/05/2020   Basal cell carcinoma of skin 08/05/2020   Benign prostatic hyperplasia 08/05/2020   Stage 3a chronic kidney disease (CKD) (HCC) 08/05/2020   COPD (chronic obstructive pulmonary disease) (HCC) 08/05/2020   Frequent fecal incontinence 08/05/2020   Glaucoma 08/05/2020   Hyperlipidemia 08/05/2020   Other long term (current) drug therapy 08/05/2020   AAA (abdominal aortic aneurysm) (HCC) 07/03/2020   AAA (abdominal aortic aneurysm) without rupture (HCC) 05/26/2020   S/P left THA, AA 02/21/2019   Status post total hip replacement, left 02/21/2019     ONSET DATE: 08/21/21  REFERRING DIAG: R26.89 (ICD-10-CM) - Balance problem   THERAPY DIAG:  Abnormality of gait and mobility  Difficulty in walking, not elsewhere classified  Muscle weakness (generalized)  Other abnormalities of gait and mobility  Rationale for Evaluation and Treatment: Rehabilitation  SUBJECTIVE:  SUBJECTIVE STATEMENT:  Pt reports he has already walked 7500 steps today.  Reports no changes since initial evaluation  Pt accompanied by: self  PERTINENT HISTORY: Pt reports he noticed he has trouble when he first gets up and he tends to stumble, he recalls no dizziness when he gets up or lightheadedness. He recalls no falls in the last 6 months but has had many stumbles where he has caught himself. Pt went to ER following 1 minute of visual changes where he could not see out of his left eye, no changes in his visual field when he was just looking out of his right eye. No significant findings of stroke per ER report and notes and per pt report. Eye doctor also does not know why the acute visual change occurred.  Pt main goal for PT is to improve his balance. Pt reports he favors his left leg per report of observers.   PAIN:  Are you having pain? No  PRECAUTIONS: None  WEIGHT BEARING RESTRICTIONS: No  FALLS: Has patient fallen in last 6 months? No  LIVING ENVIRONMENT: Lives with: lives with their family and lives with their spouse Lives in: House/apartment Stairs: No Has following equipment at home: Single point cane and only uses cane if his legs are stiff on a given day.   PLOF: Independent  PATIENT GOALS: Improve his balance  OBJECTIVE: (objective measures completed at initial evaluation unless otherwise dated)   DIAGNOSTIC FINDINGS: N/A, no acute findings for CVA or  other abnormalities at ER in February   COGNITION: Overall cognitive status: Within functional limits for tasks assessed   SENSATION: Not tested  COORDINATION: WNL   POSTURE: No Significant postural limitations   LOWER EXTREMITY MMT:    MMT Right Eval Left Eval  Hip flexion 5 5  Hip extension    Hip abduction 5 5  Hip adduction 5 5  Hip internal rotation 5 5  Hip external rotation 5 5  Knee flexion 5 5  Knee extension 4+ 5  Ankle dorsiflexion 5 5  Ankle plantarflexion 4+ 4+  Ankle inversion    Ankle eversion    (Blank rows = not tested) All tested in seated position   STAIRS: Level of Assistance: Complete Independence Stair Negotiation Technique: Alternating Pattern  with No Rails Number of Stairs: 4  Height of Stairs: 6 in  Comments: some hesitancy with descending steps   GAIT: Gait pattern:  With quick turnaround and pivot turn patient demonstrates poor single-leg stance stability and also utilizes several narrow base of support steps to recover balance from this acute loss of balance.  and step through pattern Distance walked: 30 ft Assistive device utilized: None Level of assistance: Complete Independence Comments: Difficulty with more dynamic balance tasks with simple ambulation no significant errors or limitations.  FUNCTIONAL TESTS:  5 times sit to stand: 15.77 sec  MiniBEST      PATIENT SURVEYS:  FOTO 63, risk adjusted goal of 64 ( will set goal higher to ensure legitimate change but will be lofty goal due to high initial score)   TODAY'S TREATMENT:  DATE:   Exercise/Activity Sets/ Reps/Time/ Resistance Assistance Charge type Comments  Marching 10 x each  Neuro re-ed   3/4 romberg  stance 3 x 45 sec  Neuro re-ed   Airex step up 10 x ea   Neuro re-ed   SLS with UE support  3 x 30 sec ea   Neuro re-ed   NBOS with head  turns    NMR    Airex then airex beam then airex  X 10 times through   NMR 1 LOB corrected with UE assists.   Sidestepping on airex balance beam  X 10 laps   NMR Pt has many post lOb corrected with min/ mod A from PT.   1 LE airex, 1 LE 6 inch step  3 x 30 sec   NMR   Rocker board  X 20 rocks  2 x 30 sec working on holding position   [employer] for maintaining hip to neutral position and trying utilize ankle strategies for recovery of balance on the balance board  Treatment provided this session   Pt educated throughout session about proper posture and technique with exercises. Improved exercise technique, movement at target joints, use of target muscles after min to mod verbal, visual, tactile cues. Note: Portions of this document were prepared using Dragon voice recognition software and although reviewed may contain unintentional dictation errors in syntax, grammar, or spelling.     PATIENT EDUCATION: Education details: POC Person educated: Patient Education method: Explanation Education comprehension: verbalized understanding  HOME EXERCISE PROGRAM: Access Code: [email or web address] URL: [email or web address] Date: 07/29/2022 Prepared by: Thresa Ross  Exercises - Standing Marching  - 1 x daily - 5 x weekly - 1 sets - 10 reps - 3 second hold - Standing Romberg to 3/4 Tandem Stance  - 1 x daily - 7 x weekly - 2 sets - 45 sec hold - Standing Single Leg Stance with Counter Support  - 1 x daily - 7 x weekly - 3 sets - 30 sec  hold - standing in split stance with vertical head nods   - 1 x daily - 7 x weekly - 3 sets - 10 reps - romberg stance with head turns laterally   - 1 x daily - 7 x weekly - 2 sets - 10 reps  GOALS: Goals reviewed with patient? Yes  SHORT TERM GOALS: Target date: 08/25/2022      Patient will be independent in home exercise program to improve strength/mobility for better functional independence with ADLs. Baseline: No HEP currently  Goal status:  INITIAL     LONG TERM GOALS: Target date: 10/20/2022   1.  Patient (> 76 years old) will complete five times sit to stand test in < 13 seconds indicating an increased LE strength and improved balance. Baseline: 15.77 sec Goal status: INITIAL  2.  Patient will increase FOTO score to equal to or greater than  68   to demonstrate statistically significant improvement in mobility and quality of life.  Baseline: 63 Goal status: INITIAL   3.  Patient will increase mini BEST Balance score by > 4 points to demonstrate decreased fall risk during functional activities. Baseline: 17 Goal status: INITIAL      ASSESSMENT:  CLINICAL IMPRESSION:  Continued with current plan of care as laid out in evaluation and recent prior sessions. Pt remains motivated to advance progress toward goals in order to maximize independence and safety at home. Pt requires high level assistance and cuing for completion of exercises  in order to provide adequate level of stimulation and perturbation. Author allows pt as much opportunity as possible to perform independent righting strategies, only stepping in when pt is unable to prevent falling to floor.  Patient given initial home exercise program to work on at home and verbalizes and demonstrates understanding of this.  She does have difficulty utilizing ankle strategies for recovery of balance and further activities incorporating this will be beneficial.  Pt closely monitored throughout session for safe vitals response and to maximize patient safety during interventions. Pt continues to demonstrate progress toward goals AEB progression of some interventions this date either in volume or intensity.   OBJECTIVE IMPAIRMENTS: Abnormal gait and decreased balance.   ACTIVITY LIMITATIONS: standing, squatting, and stairs  PARTICIPATION LIMITATIONS: community activity and yard work  PERSONAL FACTORS: Age, Time since onset of injury/illness/exacerbation, and 3+ comorbidities:  THA, CKD, HLD, Atherosclerosis  are also affecting patient's functional outcome.   REHAB POTENTIAL: Excellent  CLINICAL DECISION MAKING: Stable/uncomplicated  EVALUATION COMPLEXITY: Low  PLAN:  PT FREQUENCY: 2x/week  PT DURATION: 12 weeks  PLANNED INTERVENTIONS: Therapeutic exercises, Therapeutic activity, Neuromuscular re-education, Balance training, Gait training, Patient/Family education, Self Care, Joint mobilization, Manual therapy, and Re-evaluation  PLAN FOR NEXT SESSION: high level static balance activities and progression to dynamic as appropriate.    Norman Herrlich, PT 07/29/2022, 10:58 AM

## 2022-08-01 IMAGING — CT CT ABD-PEL WO/W CM
2 of 6 series · 13 of 32 positions shown, 18 images · IV contrast (APPLIED)
Comparison: None.

CLINICAL DATA: Hematuria.

EXAM:
CT ABDOMEN AND PELVIS WITHOUT AND WITH CONTRAST
TECHNIQUE: Multidetector CT imaging of the abdomen and pelvis was performed
following the standard protocol before and following the bolus
administration of intravenous contrast.
CONTRAST:  125mL OMNIPAQUE IOHEXOL 300 MG/ML  SOLN

[Series 2: axial pre · axial · non-contrast · 0.92mm/px · z∈[-1114,-754]mm · 6 of 102 slices shown]
[im 15/102  soft-tissue]
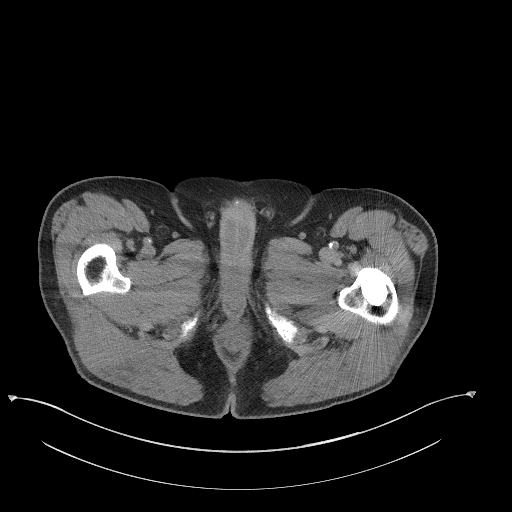
[im 29/102  soft-tissue]
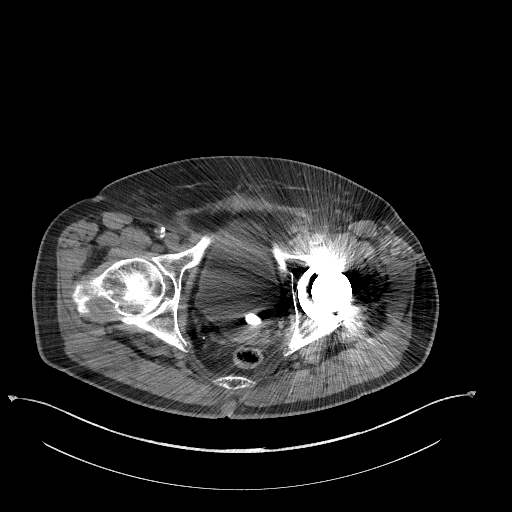
[im 44/102  soft-tissue]
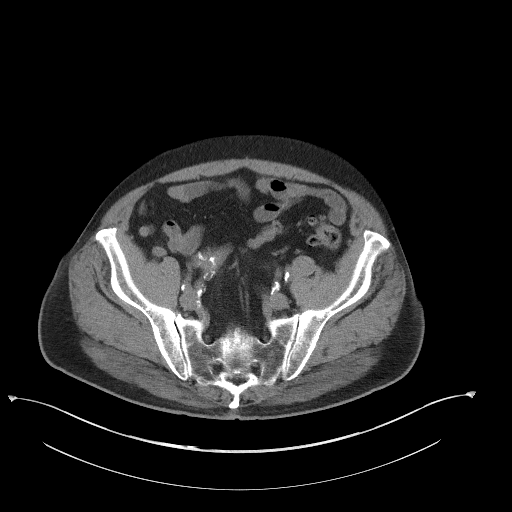
[im 58/102  soft-tissue]
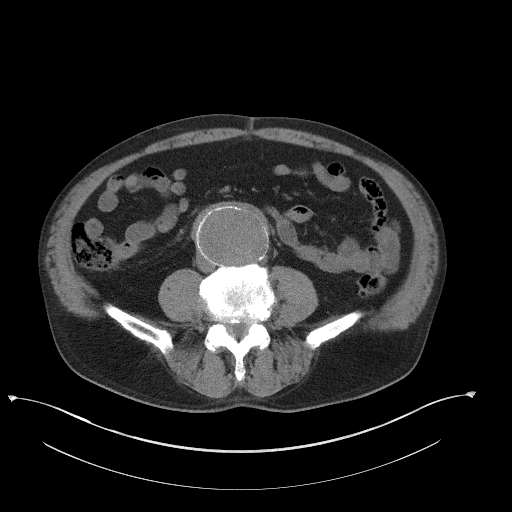
[im 73/102  soft-tissue]
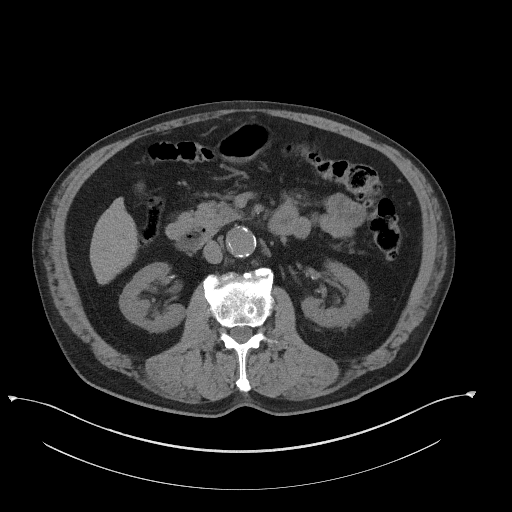
[im 87/102  soft-tissue]
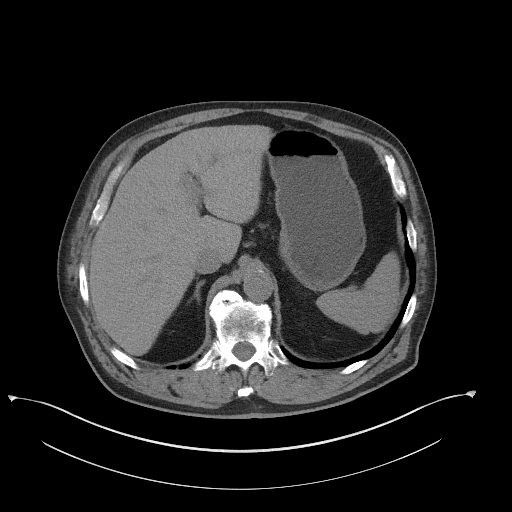

[Series 13: axial delay · axial · delayed · 0.82mm/px · z∈[-1034,-639]mm · 7 of 107 slices shown, 12 images]
[im 14/107  soft-tissue]
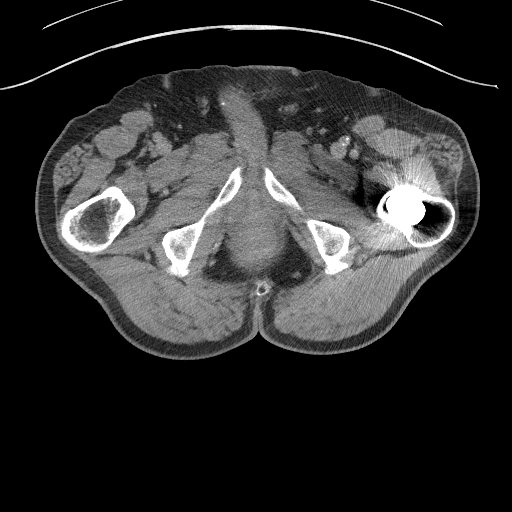
[im 14/107  bone]
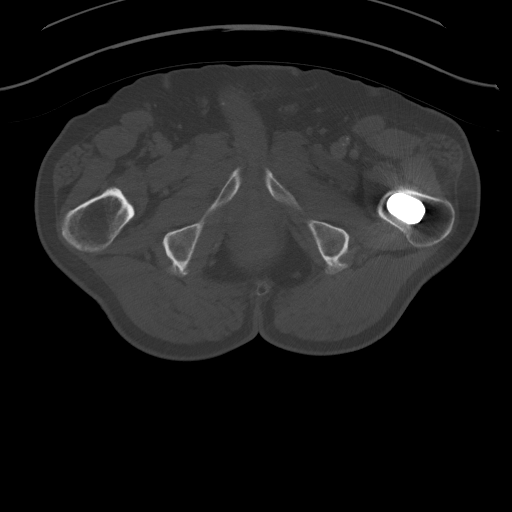
[im 27/107  soft-tissue]
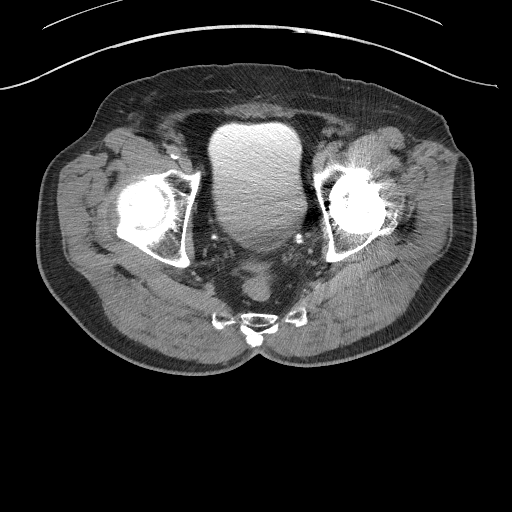
[im 40/107  soft-tissue]
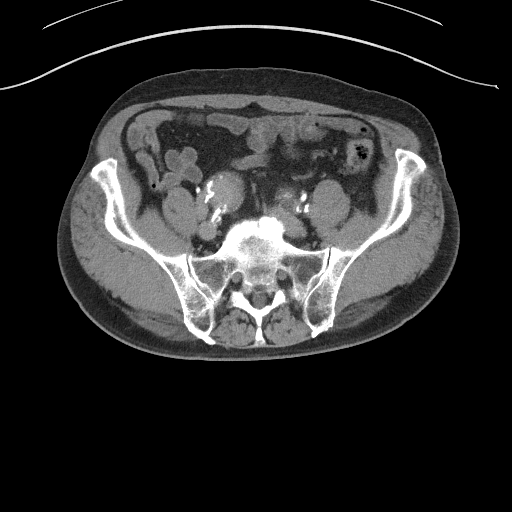
[im 54/107  soft-tissue]
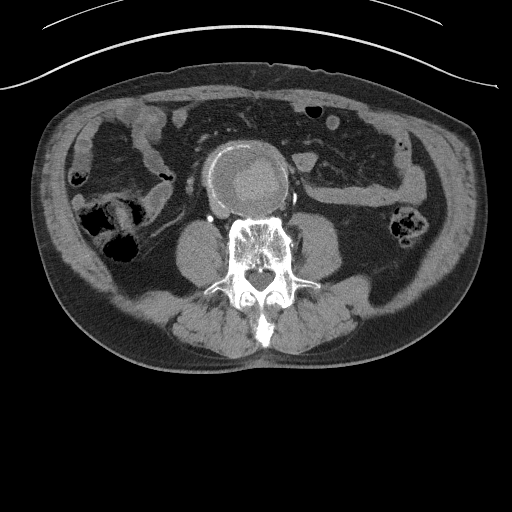
[im 54/107  lung]
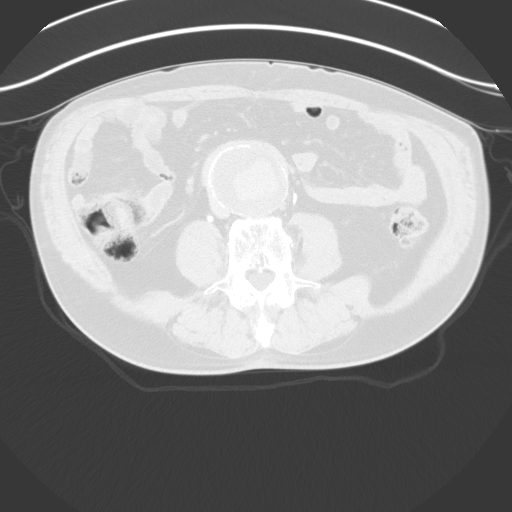
[im 67/107  soft-tissue]
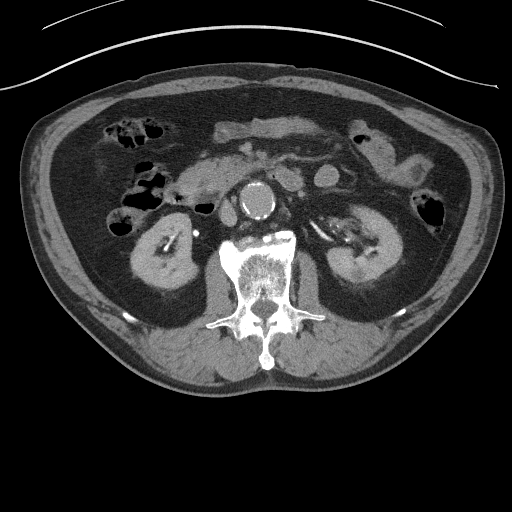
[im 67/107  lung]
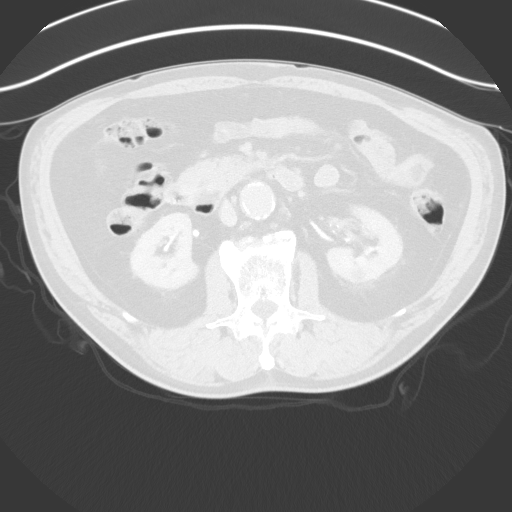
[im 80/107  soft-tissue]
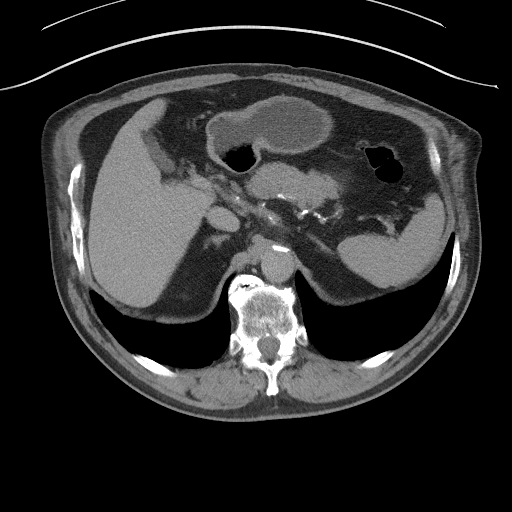
[im 80/107  lung]
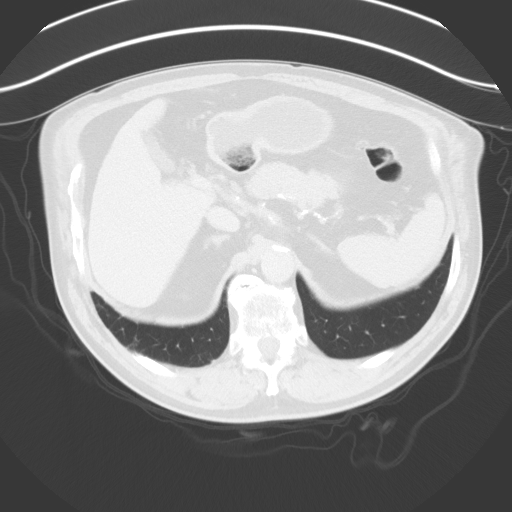
[im 93/107  soft-tissue]
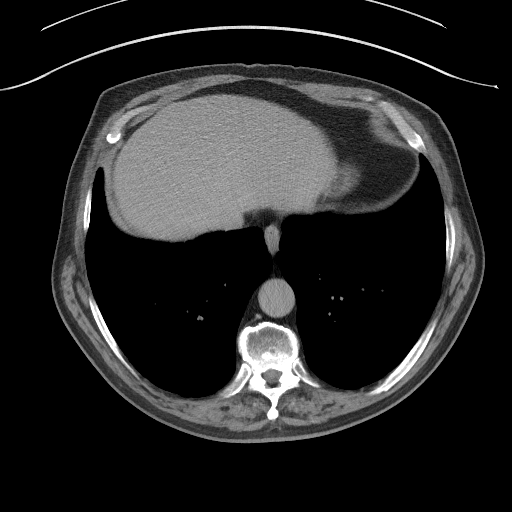
[im 93/107  lung]
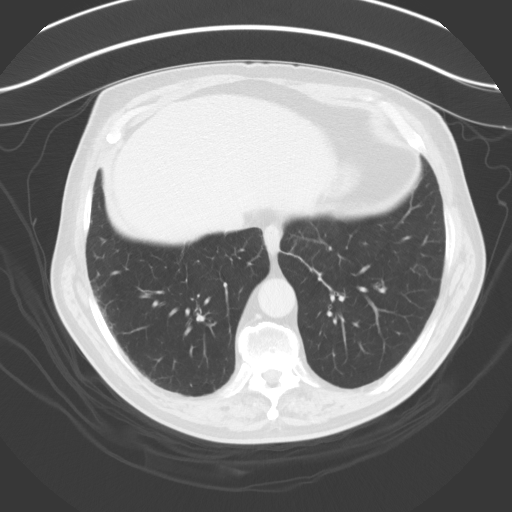

[13 of 32 positions shown; findings below may reference images not displayed]

FINDINGS: Lower chest: Emphysematous changes noted within the lung bases. No
acute abnormality.

Hepatobiliary: No focal liver abnormality is seen. No gallstones,
gallbladder wall thickening, no focal liver abnormality. Tiny
calcified gallstone noted within the fundus. No gallbladder wall
inflammation or pericholecystic fluid. No biliary ductal dilatation.

Pancreas: Unremarkable. No pancreatic ductal dilatation or
surrounding inflammatory changes.

Spleen: Normal in size without focal abnormality.

Adrenals/Urinary Tract: Normal appearance of the adrenal glands.
Bilateral renal vascular calcifications are identified. No kidney
stones identified. No hydronephrosis identified bilaterally. Mild
distal left hydroureter. Calcification at the left UVJ measures
cm, image 73/7. Two small left kidney cysts are identified. The
largest is in the anterior cortex of the left mid kidney measuring
1.5 cm.

Duplicated left renal collecting system which fuses just before the
urinary bladder, image 82/13.

Stomach/Bowel: Stomach is nondistended. Duodenal diverticulum is
identified arising off the medial wall of the descending duodenum
measuring 3.8 x 2.6 cm. The appendix is visualized and appears
normal. There is no bowel wall thickening, inflammation, or
distension. Extensive distal colonic diverticulosis identified
without acute inflammation.

Vascular/Lymphatic: Infrarenal abdominal aortic aneurysm measures
5.9 cm in maximum AP dimension. Aneurysmal dilatation of bilateral
common iliac arteries. The left common iliac artery measures 3.4 cm.
The right common iliac artery measures 4 cm. No abdominopelvic
adenopathy.

Reproductive: Mild prostate gland enlargement has mass effect upon
the bladder base.

Other: No ascites or focal fluid collections.

Musculoskeletal: Previous left hip arthroplasty. Multilevel
degenerative disc disease noted within the thoracolumbar spine.
IMPRESSION: 1. Urinary tract calculi at the left UVJ measures 1.6 cm and results
in mild distal left hydroureter.
2. Duplicated left renal collecting system which fuses just before
the urinary bladder.
3. Infrarenal abdominal aortic aneurysm measures 5.9 cm in maximum
AP dimension. Recommend referral to a vascular specialist. This
recommendation follows ACR consensus guidelines: White Paper of the
ACR Incidental Findings Committee II on Vascular Findings. [HOSPITAL] 3755; [DATE].
4. Gallstone.
5. Aortic Atherosclerosis ([id number]-WJP.P) and Emphysema ([id number]-[id number].5).

These results will be called to the ordering clinician or
representative by the Radiologist Assistant, and communication
documented in the PACS or [REDACTED].

## 2022-08-03 ENCOUNTER — Ambulatory Visit: Payer: Medicare HMO | Admitting: Physical Therapy

## 2022-08-03 DIAGNOSIS — R262 Difficulty in walking, not elsewhere classified: Secondary | ICD-10-CM | POA: Diagnosis not present

## 2022-08-03 DIAGNOSIS — R2689 Other abnormalities of gait and mobility: Secondary | ICD-10-CM

## 2022-08-03 DIAGNOSIS — M6281 Muscle weakness (generalized): Secondary | ICD-10-CM | POA: Diagnosis not present

## 2022-08-03 DIAGNOSIS — R269 Unspecified abnormalities of gait and mobility: Secondary | ICD-10-CM | POA: Diagnosis not present

## 2022-08-03 NOTE — Therapy (Signed)
OUTPATIENT PHYSICAL THERAPY NEURO treatment   Patient Name: Garrett Moore MRN: [id number] DOB:05/05/1940, 82 y.o., male Today's Date: 08/03/2022   PCP: Daisy Floro, MD  REFERRING PROVIDER: Daisy Floro, MD   END OF SESSION:  PT End of Session - 08/03/22 1106     Visit Number 3    Number of Visits 16    Date for PT Re-Evaluation 09/22/22    Progress Note Due on Visit 10    PT Start Time 1106    PT Stop Time 1146    PT Time Calculation (min) 40 min    Equipment Utilized During Treatment Gait belt    Activity Tolerance Patient tolerated treatment well    Behavior During Therapy [facility] for tasks assessed/performed              Past Medical History:  Diagnosis Date   Aneurysm of infrarenal abdominal aorta (HCC) 02/08/2018   a.) TTE 02/08/2018: Ao root 38 mm, asc Ao 40 mm; b.) TTE 06/14/2020; asc Ao 28 mm; c.) CT hematuria 05/10/2020: infrarenal AAA measuring 5.9 cm; d.) s/p EVAR 07/03/2020; e.) CT A/P 10/14/2021: residual aneurysmal sac (s/p EVAR) measured 6.0 cm   Aortic atherosclerosis (HCC)    Aortic valvar stenosis 07/21/2012   a.) TTE 07/21/2012: EF 50-55%, mild AS (AVA = 1.67, MPG 9); b.) TTE 02/08/2018: EF 60-65%; mod AS (MPG 17); c.) TTE 06/14/2020: EF 60-65%, mod AS (MPG 26.8)   Atherosclerosis of native arteries of extremity with intermittent claudication (HCC)    Basal cell carcinoma, face    BPH (benign prostatic hyperplasia)    Carotid artery stenosis 05/21/2006   a.) carotid US 05/21/2006: 0-49% BICA; b.) carotid US 06/10/2020: 1-39% RICA, total occ LICA   CKD (chronic kidney disease), stage III (HCC)    COPD (chronic obstructive pulmonary disease) (HCC)    Coronary artery disease 08/14/1983   a.) MI --> LHC 08/13/2016: CTO of the RCA --> PTCA performed resulting in 30% residual stenosis; aberrant takeoff of his RCA coming off somewhat high and posteriorly.   Diastolic dysfunction 02/08/2018   a.) TTE 02/08/2018: EF 60-65%, mild MAC,  triv TR,  mild-mod AS, G1DD; b.) TTE 06/14/2020: EF 60-65%, mod AS, triv TR, mild AR, G1DD   Diverticulosis    Family history of adverse reaction to anesthesia    a.) extended GA course for mother --> postoperaitve memory problems/dementia   GERD (gastroesophageal reflux disease)    Glaucoma    Heart murmur    History of kidney stones    Hyperlipidemia    Iliac artery aneurysm, bilateral (HCC) 07/03/2020   a.) s/p insertion of BILATERAL iliac artery stents   Myocardial infarction (HCC) 08/13/2016   a.) MI --> LHC 08/13/2016: CTO of the RCA --> PTCA performed resulting in 30% residual stenosis. Procedure complicated by procedural nausea, Patient went into IVR and then AV disaccociated rhythm. SBP dropped (80s). TVP floated with capture, which stabilized patient. ICU admission overnight.   OA (osteoarthritis)    Peripheral vascular disease (HCC)    Postoperative deep vein thrombosis (DVT) (HCC)    a.) postop age 65 following tonsillectomy (per patient report)   Past Surgical History:  Procedure Laterality Date   CATARACT EXTRACTION, BILATERAL  01/2019   CORONARY ANGIOPLASTY  08/14/1983   Procedure: CORONARY ANGIOPLASTY (PTCA pRCA); Location: [facility]; Surgeon: Al Little, MD   CYSTOSCOPY W/ RETROGRADES  08/12/2020   Procedure: CYSTOSCOPY WITH RETROGRADE PYELOGRAM;  Surgeon: Vanna Scotland, MD;  Location: [facility];  Service: Urology;;   CYSTOSCOPY WITH INSERTION OF UROLIFT N/A 12/22/2021   Procedure: CYSTOSCOPY WITH INSERTION OF UROLIFT;  Surgeon: Vanna Scotland, MD;  Location: [facility];  Service: Urology;  Laterality: N/A;   CYSTOSCOPY/URETEROSCOPY/HOLMIUM LASER/STENT PLACEMENT Left 08/12/2020   Procedure: CYSTOSCOPY/URETEROSCOPY/HOLMIUM LASER/STENT PLACEMENT;  Surgeon: Vanna Scotland, MD;  Location: [facility];  Service: Urology;  Laterality: Left;   ENDOVASCULAR REPAIR/STENT GRAFT N/A 07/03/2020   Procedure: ENDOVASCULAR REPAIR/STENT GRAFT;  Surgeon: Renford Dills, MD;  Location: [facility]  INVASIVE CV LAB;  Service: Cardiovascular;  Laterality: N/A;   PELVIC ANGIOGRAPHY Right 10/14/2021   Procedure: PELVIC ANGIOGRAPHY;  Surgeon: Renford Dills, MD;  Location: [facility] INVASIVE CV [facility];  Service: Cardiovascular;  Laterality: Right;   PERIPHERAL VASCULAR THROMBECTOMY Right 10/14/2021   Procedure: PERIPHERAL VASCULAR THROMBECTOMY;  Surgeon: Renford Dills, MD;  Location: [facility] INVASIVE CV [facility];  Service: Cardiovascular;  Laterality: Right;   ROTATOR CUFF REPAIR Left    TEMPORARY PACEMAKER  08/14/1983   Procedure: TEMPORARY PACEMAKER PLACEMENT; Location: [facility]; Surgeon: Al Little, MD   TONSILLECTOMY     TOTAL HIP ARTHROPLASTY Left 02/21/2019   Procedure: TOTAL HIP ARTHROPLASTY ANTERIOR APPROACH;  Surgeon: Durene Romans, MD;  Location: [facility];  Service: Orthopedics;  Laterality: Left;  70 mins   VITRECTOMY Bilateral    Patient Active Problem List   Diagnosis Date Noted   Moderate aortic stenosis 05/20/2022   Chronic diastolic CHF (congestive heart failure) (HCC) 05/20/2022   Paroxysmal atrial fibrillation (HCC) 05/20/2022   Vision loss of left eye 05/20/2022   Acute ischemic stroke (HCC) 05/19/2022   Atherosclerosis of native arteries of extremity with intermittent claudication (HCC) 09/28/2021   Aortic valve disease 08/05/2020   Atherosclerotic heart disease of native coronary artery without angina pectoris 08/05/2020   Basal cell carcinoma of skin 08/05/2020   Benign prostatic hyperplasia 08/05/2020   Stage 3a chronic kidney disease (CKD) (HCC) 08/05/2020   COPD (chronic obstructive pulmonary disease) (HCC) 08/05/2020   Frequent fecal incontinence 08/05/2020   Glaucoma 08/05/2020   Hyperlipidemia 08/05/2020   Other long term (current) drug therapy 08/05/2020   AAA (abdominal aortic aneurysm) (HCC) 07/03/2020   AAA (abdominal aortic aneurysm) without rupture (HCC) 05/26/2020   S/P left THA, AA 02/21/2019   Status post total hip replacement, left 02/21/2019     ONSET DATE: 08/21/21  REFERRING DIAG: R26.89 (ICD-10-CM) - Balance problem   THERAPY DIAG:  Abnormality of gait and mobility  Other abnormalities of gait and mobility  Difficulty in walking, not elsewhere classified  Muscle weakness (generalized)  Rationale for Evaluation and Treatment: Rehabilitation  SUBJECTIVE:  SUBJECTIVE STATEMENT:  Pt reports no pain on this day. Mild fatigue from mother's day activities on 08/02/22. Able to complete HEP with continued difficulty with SLS task   Pt accompanied by: self  PERTINENT HISTORY: Pt reports he noticed he has trouble when he first gets up and he tends to stumble, he recalls no dizziness when he gets up or lightheadedness. He recalls no falls in the last 6 months but has had many stumbles where he has caught himself. Pt went to ER following 1 minute of visual changes where he could not see out of his left eye, no changes in his visual field when he was just looking out of his right eye. No significant findings of stroke per ER report and notes and per pt report. Eye doctor also does not know why the acute visual change occurred.  Pt main goal for PT is to improve his balance. Pt reports he favors his left leg per report of observers.   PAIN:  Are you having pain? No  PRECAUTIONS: None  WEIGHT BEARING RESTRICTIONS: No  FALLS: Has patient fallen in last 6 months? No  LIVING ENVIRONMENT: Lives with: lives with their family and lives with their spouse Lives in: House/apartment Stairs: No Has following equipment at home: Single point cane and only uses cane if his legs are stiff on a given day.   PLOF: Independent  PATIENT GOALS: Improve his balance  OBJECTIVE: (objective measures completed at initial evaluation unless otherwise  dated)   DIAGNOSTIC FINDINGS: N/A, no acute findings for CVA or other abnormalities at ER in February   COGNITION: Overall cognitive status: Within functional limits for tasks assessed   SENSATION: Not tested  COORDINATION: WNL   POSTURE: No Significant postural limitations   LOWER EXTREMITY MMT:    MMT Right Eval Left Eval  Hip flexion 5 5  Hip extension    Hip abduction 5 5  Hip adduction 5 5  Hip internal rotation 5 5  Hip external rotation 5 5  Knee flexion 5 5  Knee extension 4+ 5  Ankle dorsiflexion 5 5  Ankle plantarflexion 4+ 4+  Ankle inversion    Ankle eversion    (Blank rows = not tested) All tested in seated position   STAIRS: Level of Assistance: Complete Independence Stair Negotiation Technique: Alternating Pattern  with No Rails Number of Stairs: 4  Height of Stairs: 6 in  Comments: some hesitancy with descending steps   GAIT: Gait pattern:  With quick turnaround and pivot turn patient demonstrates poor single-leg stance stability and also utilizes several narrow base of support steps to recover balance from this acute loss of balance.  and step through pattern Distance walked: 30 ft Assistive device utilized: None Level of assistance: Complete Independence Comments: Difficulty with more dynamic balance tasks with simple ambulation no significant errors or limitations.  FUNCTIONAL TESTS:  5 times sit to stand: 15.77 sec  MiniBEST      PATIENT SURVEYS:  FOTO 63, risk adjusted goal of 64 ( will set goal higher to ensure legitimate change but will be lofty goal due to high initial score)   TODAY'S TREATMENT:  DATE:   Gait without UE support x 174ft mild veering R and L but no overt LOB. Pt able correct all LOB without additional assist  PT applied 2.5 # ankle weights then completed gait x 342ft with supervision  assist. Mild foot drag on the R side with fatigue for last 66ft.   Dynamic gait trainig in parallel bars Side stepping 45ft x 5 bil Forward reverse 42ft x 5 each.   Static standing:  1 foot on 6inch step 2 x 30 sec bil.  On airex pad normal BOS/narrow BOS 2 x 30 each Alternating foot tap on 6inch step while staning on airex pad.  Forward step over 5inch hurdle x 10 bil  Lateral step over 5inch hurdle x 10 bil.  Min assist from PT with SLS task to improved weight shift R and L as well as cues for decreased force of heel contact to improve glute med engagement    Leg press 70# 2 x 8 with min assist from PT on first bout to improve control of eccentric motion.      PATIENT EDUCATION: Education details: POC Person educated: Patient Education method: Explanation Education comprehension: verbalized understanding  HOME EXERCISE PROGRAM: Access Code: [email or web address] URL: [email or web address] Date: 07/29/2022 Prepared by: Thresa Ross  Exercises - Standing Marching  - 1 x daily - 5 x weekly - 1 sets - 10 reps - 3 second hold - Standing Romberg to 3/4 Tandem Stance  - 1 x daily - 7 x weekly - 2 sets - 45 sec hold - Standing Single Leg Stance with Counter Support  - 1 x daily - 7 x weekly - 3 sets - 30 sec  hold - standing in split stance with vertical head nods   - 1 x daily - 7 x weekly - 3 sets - 10 reps - romberg stance with head turns laterally   - 1 x daily - 7 x weekly - 2 sets - 10 reps  GOALS: Goals reviewed with patient? Yes  SHORT TERM GOALS: Target date: 08/25/2022      Patient will be independent in home exercise program to improve strength/mobility for better functional independence with ADLs. Baseline: No HEP currently  Goal status: INITIAL     LONG TERM GOALS: Target date: 10/20/2022   1.  Patient (> 60 years old) will complete five times sit to stand test in < 13 seconds indicating an increased LE strength and improved balance. Baseline: 15.77  sec Goal status: INITIAL  2.  Patient will increase FOTO score to equal to or greater than  68   to demonstrate statistically significant improvement in mobility and quality of life.  Baseline: 63 Goal status: INITIAL   3.  Patient will increase mini BEST Balance score by > 4 points to demonstrate decreased fall risk during functional activities. Baseline: 17 Goal status: INITIAL      ASSESSMENT:  CLINICAL IMPRESSION:  Continued with current plan of care as laid out in evaluation and recent prior sessions. Pt remains motivated to advance progress toward goals in order to maximize independence and safety at home.  Pt instructed in dynamic balance and gait training to improve balance strategies and hip/core stability. Noted to have increased difficulty with SLS task and required assist from PT to prevent Lateral LOB and improve weight shift R and L to reciprocal movement.  Pt would continue to benefit from skilled PT to improve balance, safety, reduce fall risk, improve QoL.   OBJECTIVE IMPAIRMENTS: Abnormal gait and  decreased balance.   ACTIVITY LIMITATIONS: standing, squatting, and stairs  PARTICIPATION LIMITATIONS: community activity and yard work  PERSONAL FACTORS: Age, Time since onset of injury/illness/exacerbation, and 3+ comorbidities: THA, CKD, HLD, Atherosclerosis  are also affecting patient's functional outcome.   REHAB POTENTIAL: Excellent  CLINICAL DECISION MAKING: Stable/uncomplicated  EVALUATION COMPLEXITY: Low  PLAN:  PT FREQUENCY: 2x/week  PT DURATION: 12 weeks  PLANNED INTERVENTIONS: Therapeutic exercises, Therapeutic activity, Neuromuscular re-education, Balance training, Gait training, Patient/Family education, Self Care, Joint mobilization, Manual therapy, and Re-evaluation  PLAN FOR NEXT SESSION:   high level static balance activities and progression to dynamic as appropriate.    Grier Rocher PT, DPT  Physical Therapist - [facility]  11:48 AM 08/03/22

## 2022-08-05 ENCOUNTER — Ambulatory Visit: Payer: Medicare HMO | Admitting: Physical Therapy

## 2022-08-05 DIAGNOSIS — R269 Unspecified abnormalities of gait and mobility: Secondary | ICD-10-CM

## 2022-08-05 DIAGNOSIS — M6281 Muscle weakness (generalized): Secondary | ICD-10-CM

## 2022-08-05 DIAGNOSIS — R262 Difficulty in walking, not elsewhere classified: Secondary | ICD-10-CM | POA: Diagnosis not present

## 2022-08-05 DIAGNOSIS — R2689 Other abnormalities of gait and mobility: Secondary | ICD-10-CM

## 2022-08-05 NOTE — Therapy (Signed)
OUTPATIENT PHYSICAL THERAPY NEURO treatment   Patient Name: Garrett Moore MRN: [id number] DOB:Jun 26, 1940, 82 y.o., male Today's Date: 08/05/2022   PCP: Daisy Floro, MD  REFERRING PROVIDER: Daisy Floro, MD   END OF SESSION:  PT End of Session - 08/05/22 1051     Visit Number 4    Number of Visits 16    Date for PT Re-Evaluation 09/22/22    Progress Note Due on Visit 10    PT Start Time 1053    PT Stop Time 1136    PT Time Calculation (min) 43 min    Equipment Utilized During Treatment Gait belt    Activity Tolerance Patient tolerated treatment well    Behavior During Therapy [facility] for tasks assessed/performed              Past Medical History:  Diagnosis Date   Aneurysm of infrarenal abdominal aorta (HCC) 02/08/2018   a.) TTE 02/08/2018: Ao root 38 mm, asc Ao 40 mm; b.) TTE 06/14/2020; asc Ao 28 mm; c.) CT hematuria 05/10/2020: infrarenal AAA measuring 5.9 cm; d.) s/p EVAR 07/03/2020; e.) CT A/P 10/14/2021: residual aneurysmal sac (s/p EVAR) measured 6.0 cm   Aortic atherosclerosis (HCC)    Aortic valvar stenosis 07/21/2012   a.) TTE 07/21/2012: EF 50-55%, mild AS (AVA = 1.67, MPG 9); b.) TTE 02/08/2018: EF 60-65%; mod AS (MPG 17); c.) TTE 06/14/2020: EF 60-65%, mod AS (MPG 26.8)   Atherosclerosis of native arteries of extremity with intermittent claudication (HCC)    Basal cell carcinoma, face    BPH (benign prostatic hyperplasia)    Carotid artery stenosis 05/21/2006   a.) carotid US 05/21/2006: 0-49% BICA; b.) carotid US 06/10/2020: 1-39% RICA, total occ LICA   CKD (chronic kidney disease), stage III (HCC)    COPD (chronic obstructive pulmonary disease) (HCC)    Coronary artery disease 08/14/1983   a.) MI --> LHC 08/13/2016: CTO of the RCA --> PTCA performed resulting in 30% residual stenosis; aberrant takeoff of his RCA coming off somewhat high and posteriorly.   Diastolic dysfunction 02/08/2018   a.) TTE 02/08/2018: EF 60-65%, mild MAC,  triv TR,  mild-mod AS, G1DD; b.) TTE 06/14/2020: EF 60-65%, mod AS, triv TR, mild AR, G1DD   Diverticulosis    Family history of adverse reaction to anesthesia    a.) extended GA course for mother --> postoperaitve memory problems/dementia   GERD (gastroesophageal reflux disease)    Glaucoma    Heart murmur    History of kidney stones    Hyperlipidemia    Iliac artery aneurysm, bilateral (HCC) 07/03/2020   a.) s/p insertion of BILATERAL iliac artery stents   Myocardial infarction (HCC) 08/13/2016   a.) MI --> LHC 08/13/2016: CTO of the RCA --> PTCA performed resulting in 30% residual stenosis. Procedure complicated by procedural nausea, Patient went into IVR and then AV disaccociated rhythm. SBP dropped (80s). TVP floated with capture, which stabilized patient. ICU admission overnight.   OA (osteoarthritis)    Peripheral vascular disease (HCC)    Postoperative deep vein thrombosis (DVT) (HCC)    a.) postop age 36 following tonsillectomy (per patient report)   Past Surgical History:  Procedure Laterality Date   CATARACT EXTRACTION, BILATERAL  01/2019   CORONARY ANGIOPLASTY  08/14/1983   Procedure: CORONARY ANGIOPLASTY (PTCA pRCA); Location: [facility]; Surgeon: Al Little, MD   CYSTOSCOPY W/ RETROGRADES  08/12/2020   Procedure: CYSTOSCOPY WITH RETROGRADE PYELOGRAM;  Surgeon: Vanna Scotland, MD;  Location: [facility];  Service: Urology;;   CYSTOSCOPY WITH INSERTION OF UROLIFT N/A 12/22/2021   Procedure: CYSTOSCOPY WITH INSERTION OF UROLIFT;  Surgeon: Vanna Scotland, MD;  Location: [facility];  Service: Urology;  Laterality: N/A;   CYSTOSCOPY/URETEROSCOPY/HOLMIUM LASER/STENT PLACEMENT Left 08/12/2020   Procedure: CYSTOSCOPY/URETEROSCOPY/HOLMIUM LASER/STENT PLACEMENT;  Surgeon: Vanna Scotland, MD;  Location: [facility];  Service: Urology;  Laterality: Left;   ENDOVASCULAR REPAIR/STENT GRAFT N/A 07/03/2020   Procedure: ENDOVASCULAR REPAIR/STENT GRAFT;  Surgeon: Renford Dills, MD;  Location: [facility]  INVASIVE CV LAB;  Service: Cardiovascular;  Laterality: N/A;   PELVIC ANGIOGRAPHY Right 10/14/2021   Procedure: PELVIC ANGIOGRAPHY;  Surgeon: Renford Dills, MD;  Location: [facility] INVASIVE CV [facility];  Service: Cardiovascular;  Laterality: Right;   PERIPHERAL VASCULAR THROMBECTOMY Right 10/14/2021   Procedure: PERIPHERAL VASCULAR THROMBECTOMY;  Surgeon: Renford Dills, MD;  Location: [facility] INVASIVE CV LAB;  Service: Cardiovascular;  Laterality: Right;   ROTATOR CUFF REPAIR Left    TEMPORARY PACEMAKER  08/14/1983   Procedure: TEMPORARY PACEMAKER PLACEMENT; Location: [facility]; Surgeon: Al Little, MD   TONSILLECTOMY     TOTAL HIP ARTHROPLASTY Left 02/21/2019   Procedure: TOTAL HIP ARTHROPLASTY ANTERIOR APPROACH;  Surgeon: Durene Romans, MD;  Location: [facility];  Service: Orthopedics;  Laterality: Left;  70 mins   VITRECTOMY Bilateral    Patient Active Problem List   Diagnosis Date Noted   Moderate aortic stenosis 05/20/2022   Chronic diastolic CHF (congestive heart failure) (HCC) 05/20/2022   Paroxysmal atrial fibrillation (HCC) 05/20/2022   Vision loss of left eye 05/20/2022   Acute ischemic stroke (HCC) 05/19/2022   Atherosclerosis of native arteries of extremity with intermittent claudication (HCC) 09/28/2021   Aortic valve disease 08/05/2020   Atherosclerotic heart disease of native coronary artery without angina pectoris 08/05/2020   Basal cell carcinoma of skin 08/05/2020   Benign prostatic hyperplasia 08/05/2020   Stage 3a chronic kidney disease (CKD) (HCC) 08/05/2020   COPD (chronic obstructive pulmonary disease) (HCC) 08/05/2020   Frequent fecal incontinence 08/05/2020   Glaucoma 08/05/2020   Hyperlipidemia 08/05/2020   Other long term (current) drug therapy 08/05/2020   AAA (abdominal aortic aneurysm) (HCC) 07/03/2020   AAA (abdominal aortic aneurysm) without rupture (HCC) 05/26/2020   S/P left THA, AA 02/21/2019   Status post total hip replacement, left 02/21/2019     ONSET DATE: 08/21/21  REFERRING DIAG: R26.89 (ICD-10-CM) - Balance problem   THERAPY DIAG:  Abnormality of gait and mobility  Other abnormalities of gait and mobility  Difficulty in walking, not elsewhere classified  Muscle weakness (generalized)  Rationale for Evaluation and Treatment: Rehabilitation  SUBJECTIVE:  SUBJECTIVE STATEMENT:  Pt reports no pain on this day. Mild fatigue from mother's day activities on 08/02/22. Able to complete HEP with continued difficulty with SLS task   Pt accompanied by: self  PERTINENT HISTORY: Pt reports he noticed he has trouble when he first gets up and he tends to stumble, he recalls no dizziness when he gets up or lightheadedness. He recalls no falls in the last 6 months but has had many stumbles where he has caught himself. Pt went to ER following 1 minute of visual changes where he could not see out of his left eye, no changes in his visual field when he was just looking out of his right eye. No significant findings of stroke per ER report and notes and per pt report. Eye doctor also does not know why the acute visual change occurred.  Pt main goal for PT is to improve his balance. Pt reports he favors his left leg per report of observers.   PAIN:  Are you having pain? No  PRECAUTIONS: None  WEIGHT BEARING RESTRICTIONS: No  FALLS: Has patient fallen in last 6 months? No  LIVING ENVIRONMENT: Lives with: lives with their family and lives with their spouse Lives in: House/apartment Stairs: No Has following equipment at home: Single point cane and only uses cane if his legs are stiff on a given day.   PLOF: Independent  PATIENT GOALS: Improve his balance  OBJECTIVE: (objective measures completed at initial evaluation unless otherwise  dated)   DIAGNOSTIC FINDINGS: N/A, no acute findings for CVA or other abnormalities at ER in February   COGNITION: Overall cognitive status: Within functional limits for tasks assessed   SENSATION: Not tested  COORDINATION: WNL   POSTURE: No Significant postural limitations   LOWER EXTREMITY MMT:    MMT Right Eval Left Eval  Hip flexion 5 5  Hip extension    Hip abduction 5 5  Hip adduction 5 5  Hip internal rotation 5 5  Hip external rotation 5 5  Knee flexion 5 5  Knee extension 4+ 5  Ankle dorsiflexion 5 5  Ankle plantarflexion 4+ 4+  Ankle inversion    Ankle eversion    (Blank rows = not tested) All tested in seated position   STAIRS: Level of Assistance: Complete Independence Stair Negotiation Technique: Alternating Pattern  with No Rails Number of Stairs: 4  Height of Stairs: 6 in  Comments: some hesitancy with descending steps   GAIT: Gait pattern:  With quick turnaround and pivot turn patient demonstrates poor single-leg stance stability and also utilizes several narrow base of support steps to recover balance from this acute loss of balance.  and step through pattern Distance walked: 30 ft Assistive device utilized: None Level of assistance: Complete Independence Comments: Difficulty with more dynamic balance tasks with simple ambulation no significant errors or limitations.  FUNCTIONAL TESTS:  5 times sit to stand: 15.77 sec  MiniBEST      PATIENT SURVEYS:  FOTO 63, risk adjusted goal of 64 ( will set goal higher to ensure legitimate change but will be lofty goal due to high initial score)   TODAY'S TREATMENT:  DATE:   Pt performed step up to 6inch step no UE support x 10 bil.   Blaze pods on 6inch step   Random  with 3 pods, 1 min performed x 2 Bil with therapeutic rest break between bouts Hits: Round 1 R: 37 L: 31   Round 2 R:35 Focus: pods on 6inch step  1 round for each LE R 33: L31  Min assist when standing on RLE to improve weight shift over the RLE prevent LOB in all directions.   Standing 3 way hip 2x 10 each with RTB cues for 2 sec hold ar end range and decreased speed of eccentric movement.   Gait with 3# ankle weights x 129ft with supervision assist and cues for safety in turns due to mild veer R and L      PATIENT EDUCATION: Education details: POC Person educated: Patient Education method: Explanation Education comprehension: verbalized understanding  HOME EXERCISE PROGRAM: Access Code: [email or web address] URL: [email or web address] Date: 07/29/2022 Prepared by: Thresa Ross  Exercises - Standing Marching  - 1 x daily - 5 x weekly - 1 sets - 10 reps - 3 second hold - Standing Romberg to 3/4 Tandem Stance  - 1 x daily - 7 x weekly - 2 sets - 45 sec hold - Standing Single Leg Stance with Counter Support  - 1 x daily - 7 x weekly - 3 sets - 30 sec  hold - standing in split stance with vertical head nods   - 1 x daily - 7 x weekly - 3 sets - 10 reps - romberg stance with head turns laterally   - 1 x daily - 7 x weekly - 2 sets - 10 reps  GOALS: Goals reviewed with patient? Yes  SHORT TERM GOALS: Target date: 08/25/2022      Patient will be independent in home exercise program to improve strength/mobility for better functional independence with ADLs. Baseline: No HEP currently  Goal status: INITIAL     LONG TERM GOALS: Target date: 10/20/2022   1.  Patient (> 31 years old) will complete five times sit to stand test in < 13 seconds indicating an increased LE strength and improved balance. Baseline: 15.77 sec Goal status: INITIAL  2.  Patient will increase FOTO score to equal to or greater than  68   to demonstrate statistically significant improvement in mobility and quality of life.  Baseline: 63 Goal status: INITIAL   3.  Patient will increase mini BEST  Balance score by > 4 points to demonstrate decreased fall risk during functional activities. Baseline: 17 Goal status: INITIAL      ASSESSMENT:  CLINICAL IMPRESSION:  Continued with current plan of care as laid out in evaluation and recent prior sessions. Pt demonstrated increased difficulty with SLS task when standing on the RLE today requiring assist for adeqate weight shift to prevent LOB in all directions. Tolerated advancement in general hip strengthening on due to instruction for decreased compensation from trunk. Pt would continue to benefit from skilled PT to improve balance, safety, reduce fall risk, improve QoL.   OBJECTIVE IMPAIRMENTS: Abnormal gait and decreased balance.   ACTIVITY LIMITATIONS: standing, squatting, and stairs  PARTICIPATION LIMITATIONS: community activity and yard work  PERSONAL FACTORS: Age, Time since onset of injury/illness/exacerbation, and 3+ comorbidities: THA, CKD, HLD, Atherosclerosis  are also affecting patient's functional outcome.   REHAB POTENTIAL: Excellent  CLINICAL DECISION MAKING: Stable/uncomplicated  EVALUATION COMPLEXITY: Low  PLAN:  PT FREQUENCY: 2x/week  PT DURATION: 12 weeks  PLANNED INTERVENTIONS: Therapeutic exercises, Therapeutic activity, Neuromuscular re-education, Balance training, Gait training, Patient/Family education, Self Care, Joint mobilization, Manual therapy, and Re-evaluation  PLAN FOR NEXT SESSION:   high level static balance activities and progression to dynamic as appropriate. BLE hip strengthening.   Grier Rocher PT, DPT  Physical Therapist - [facility]  11:38 AM 08/05/22

## 2022-08-10 ENCOUNTER — Ambulatory Visit: Payer: Medicare HMO | Admitting: Physical Therapy

## 2022-08-10 DIAGNOSIS — R2689 Other abnormalities of gait and mobility: Secondary | ICD-10-CM | POA: Diagnosis not present

## 2022-08-10 DIAGNOSIS — R262 Difficulty in walking, not elsewhere classified: Secondary | ICD-10-CM

## 2022-08-10 DIAGNOSIS — M6281 Muscle weakness (generalized): Secondary | ICD-10-CM

## 2022-08-10 DIAGNOSIS — R269 Unspecified abnormalities of gait and mobility: Secondary | ICD-10-CM | POA: Diagnosis not present

## 2022-08-10 NOTE — Therapy (Signed)
OUTPATIENT PHYSICAL THERAPY NEURO treatment   Patient Name: Garrett Moore MRN: [id number] DOB:05/21/1940, 82 y.o., male Today's Date: 08/10/2022   PCP: Daisy Floro, MD  REFERRING PROVIDER: Daisy Floro, MD   END OF SESSION:  PT End of Session - 08/10/22 1103     Visit Number 5    Number of Visits 16    Date for PT Re-Evaluation 09/22/22    Progress Note Due on Visit 10    PT Start Time 1105    PT Stop Time 1145    PT Time Calculation (min) 40 min    Equipment Utilized During Treatment Gait belt    Activity Tolerance Patient tolerated treatment well    Behavior During Therapy [facility] for tasks assessed/performed              Past Medical History:  Diagnosis Date   Aneurysm of infrarenal abdominal aorta (HCC) 02/08/2018   a.) TTE 02/08/2018: Ao root 38 mm, asc Ao 40 mm; b.) TTE 06/14/2020; asc Ao 28 mm; c.) CT hematuria 05/10/2020: infrarenal AAA measuring 5.9 cm; d.) s/p EVAR 07/03/2020; e.) CT A/P 10/14/2021: residual aneurysmal sac (s/p EVAR) measured 6.0 cm   Aortic atherosclerosis (HCC)    Aortic valvar stenosis 07/21/2012   a.) TTE 07/21/2012: EF 50-55%, mild AS (AVA = 1.67, MPG 9); b.) TTE 02/08/2018: EF 60-65%; mod AS (MPG 17); c.) TTE 06/14/2020: EF 60-65%, mod AS (MPG 26.8)   Atherosclerosis of native arteries of extremity with intermittent claudication (HCC)    Basal cell carcinoma, face    BPH (benign prostatic hyperplasia)    Carotid artery stenosis 05/21/2006   a.) carotid US 05/21/2006: 0-49% BICA; b.) carotid US 06/10/2020: 1-39% RICA, total occ LICA   CKD (chronic kidney disease), stage III (HCC)    COPD (chronic obstructive pulmonary disease) (HCC)    Coronary artery disease 08/14/1983   a.) MI --> LHC 08/13/2016: CTO of the RCA --> PTCA performed resulting in 30% residual stenosis; aberrant takeoff of his RCA coming off somewhat high and posteriorly.   Diastolic dysfunction 02/08/2018   a.) TTE 02/08/2018: EF 60-65%, mild MAC,  triv TR,  mild-mod AS, G1DD; b.) TTE 06/14/2020: EF 60-65%, mod AS, triv TR, mild AR, G1DD   Diverticulosis    Family history of adverse reaction to anesthesia    a.) extended GA course for mother --> postoperaitve memory problems/dementia   GERD (gastroesophageal reflux disease)    Glaucoma    Heart murmur    History of kidney stones    Hyperlipidemia    Iliac artery aneurysm, bilateral (HCC) 07/03/2020   a.) s/p insertion of BILATERAL iliac artery stents   Myocardial infarction (HCC) 08/13/2016   a.) MI --> LHC 08/13/2016: CTO of the RCA --> PTCA performed resulting in 30% residual stenosis. Procedure complicated by procedural nausea, Patient went into IVR and then AV disaccociated rhythm. SBP dropped (80s). TVP floated with capture, which stabilized patient. ICU admission overnight.   OA (osteoarthritis)    Peripheral vascular disease (HCC)    Postoperative deep vein thrombosis (DVT) (HCC)    a.) postop age 80 following tonsillectomy (per patient report)   Past Surgical History:  Procedure Laterality Date   CATARACT EXTRACTION, BILATERAL  01/2019   CORONARY ANGIOPLASTY  08/14/1983   Procedure: CORONARY ANGIOPLASTY (PTCA pRCA); Location: [facility]; Surgeon: Al Little, MD   CYSTOSCOPY W/ RETROGRADES  08/12/2020   Procedure: CYSTOSCOPY WITH RETROGRADE PYELOGRAM;  Surgeon: Vanna Scotland, MD;  Location: [facility];  Service: Urology;;   CYSTOSCOPY WITH INSERTION OF UROLIFT N/A 12/22/2021   Procedure: CYSTOSCOPY WITH INSERTION OF UROLIFT;  Surgeon: Vanna Scotland, MD;  Location: [facility];  Service: Urology;  Laterality: N/A;   CYSTOSCOPY/URETEROSCOPY/HOLMIUM LASER/STENT PLACEMENT Left 08/12/2020   Procedure: CYSTOSCOPY/URETEROSCOPY/HOLMIUM LASER/STENT PLACEMENT;  Surgeon: Vanna Scotland, MD;  Location: [facility];  Service: Urology;  Laterality: Left;   ENDOVASCULAR REPAIR/STENT GRAFT N/A 07/03/2020   Procedure: ENDOVASCULAR REPAIR/STENT GRAFT;  Surgeon: Renford Dills, MD;  Location: [facility]  INVASIVE CV LAB;  Service: Cardiovascular;  Laterality: N/A;   PELVIC ANGIOGRAPHY Right 10/14/2021   Procedure: PELVIC ANGIOGRAPHY;  Surgeon: Renford Dills, MD;  Location: [facility] INVASIVE CV [facility];  Service: Cardiovascular;  Laterality: Right;   PERIPHERAL VASCULAR THROMBECTOMY Right 10/14/2021   Procedure: PERIPHERAL VASCULAR THROMBECTOMY;  Surgeon: Renford Dills, MD;  Location: [facility] INVASIVE CV LAB;  Service: Cardiovascular;  Laterality: Right;   ROTATOR CUFF REPAIR Left    TEMPORARY PACEMAKER  08/14/1983   Procedure: TEMPORARY PACEMAKER PLACEMENT; Location: [facility]; Surgeon: Al Little, MD   TONSILLECTOMY     TOTAL HIP ARTHROPLASTY Left 02/21/2019   Procedure: TOTAL HIP ARTHROPLASTY ANTERIOR APPROACH;  Surgeon: Durene Romans, MD;  Location: [facility];  Service: Orthopedics;  Laterality: Left;  70 mins   VITRECTOMY Bilateral    Patient Active Problem List   Diagnosis Date Noted   Moderate aortic stenosis 05/20/2022   Chronic diastolic CHF (congestive heart failure) (HCC) 05/20/2022   Paroxysmal atrial fibrillation (HCC) 05/20/2022   Vision loss of left eye 05/20/2022   Acute ischemic stroke (HCC) 05/19/2022   Atherosclerosis of native arteries of extremity with intermittent claudication (HCC) 09/28/2021   Aortic valve disease 08/05/2020   Atherosclerotic heart disease of native coronary artery without angina pectoris 08/05/2020   Basal cell carcinoma of skin 08/05/2020   Benign prostatic hyperplasia 08/05/2020   Stage 3a chronic kidney disease (CKD) (HCC) 08/05/2020   COPD (chronic obstructive pulmonary disease) (HCC) 08/05/2020   Frequent fecal incontinence 08/05/2020   Glaucoma 08/05/2020   Hyperlipidemia 08/05/2020   Other long term (current) drug therapy 08/05/2020   AAA (abdominal aortic aneurysm) (HCC) 07/03/2020   AAA (abdominal aortic aneurysm) without rupture (HCC) 05/26/2020   S/P left THA, AA 02/21/2019   Status post total hip replacement, left 02/21/2019     ONSET DATE: 08/21/21  REFERRING DIAG: R26.89 (ICD-10-CM) - Balance problem   THERAPY DIAG:  Abnormality of gait and mobility  Other abnormalities of gait and mobility  Difficulty in walking, not elsewhere classified  Muscle weakness (generalized)  Rationale for Evaluation and Treatment: Rehabilitation  SUBJECTIVE:  SUBJECTIVE STATEMENT:  Pt reports no pain on this day. Mild fatigue from mother's day activities on 08/02/22. Able to complete HEP with continued difficulty with SLS task   Pt accompanied by: self  PERTINENT HISTORY: Pt reports he noticed he has trouble when he first gets up and he tends to stumble, he recalls no dizziness when he gets up or lightheadedness. He recalls no falls in the last 6 months but has had many stumbles where he has caught himself. Pt went to ER following 1 minute of visual changes where he could not see out of his left eye, no changes in his visual field when he was just looking out of his right eye. No significant findings of stroke per ER report and notes and per pt report. Eye doctor also does not know why the acute visual change occurred.  Pt main goal for PT is to improve his balance. Pt reports he favors his left leg per report of observers.   PAIN:  Are you having pain? No  PRECAUTIONS: None  WEIGHT BEARING RESTRICTIONS: No  FALLS: Has patient fallen in last 6 months? No  LIVING ENVIRONMENT: Lives with: lives with their family and lives with their spouse Lives in: House/apartment Stairs: No Has following equipment at home: Single point cane and only uses cane if his legs are stiff on a given day.   PLOF: Independent  PATIENT GOALS: Improve his balance  OBJECTIVE: (objective measures completed at initial evaluation unless otherwise  dated)   DIAGNOSTIC FINDINGS: N/A, no acute findings for CVA or other abnormalities at ER in February   COGNITION: Overall cognitive status: Within functional limits for tasks assessed   SENSATION: Not tested  COORDINATION: WNL   POSTURE: No Significant postural limitations   LOWER EXTREMITY MMT:    MMT Right Eval Left Eval  Hip flexion 5 5  Hip extension    Hip abduction 5 5  Hip adduction 5 5  Hip internal rotation 5 5  Hip external rotation 5 5  Knee flexion 5 5  Knee extension 4+ 5  Ankle dorsiflexion 5 5  Ankle plantarflexion 4+ 4+  Ankle inversion    Ankle eversion    (Blank rows = not tested) All tested in seated position   STAIRS: Level of Assistance: Complete Independence Stair Negotiation Technique: Alternating Pattern  with No Rails Number of Stairs: 4  Height of Stairs: 6 in  Comments: some hesitancy with descending steps   GAIT: Gait pattern:  With quick turnaround and pivot turn patient demonstrates poor single-leg stance stability and also utilizes several narrow base of support steps to recover balance from this acute loss of balance.  and step through pattern Distance walked: 30 ft Assistive device utilized: None Level of assistance: Complete Independence Comments: Difficulty with more dynamic balance tasks with simple ambulation no significant errors or limitations.  FUNCTIONAL TESTS:  5 times sit to stand: 15.77 sec  MiniBEST      PATIENT SURVEYS:  FOTO 63, risk adjusted goal of 64 ( will set goal higher to ensure legitimate change but will be lofty goal due to high initial score)   TODAY'S TREATMENT:  DATE:    Gait through hall with head turns to identify objects on wall  Semitandem on step 2 x 30 sec  SLS 2 x 15 sec  Tandem stance 2 x 15 sec  Dynamic gait training for obstacle navigation stepping over 2  canes and half bolster performed x 6  Addition dynamic gait training over cane, bolster, airex beam, hedge hog weave and up/down aerobic step x 3 with cues for attention to obstacles and ankle strategy to correct lateral OB.   Tandem stance on airex beam 3 x 30 sec with intermittent UE support  Static standing on wedge 2 x 30 sec.  Calf raise from wedge 2 x 10 with UE support   Min-CGA from PT for safety throughout session unless otherwise notes. Verbal and tactile instruction for use of ankle strategy    PATIENT EDUCATION: Education details: POC Person educated: Patient Education method: Explanation Education comprehension: verbalized understanding  HOME EXERCISE PROGRAM: Access Code: [email or web address] URL: [email or web address] Date: 07/29/2022 Prepared by: Thresa Ross  Exercises - Standing Marching  - 1 x daily - 5 x weekly - 1 sets - 10 reps - 3 second hold - Standing Romberg to 3/4 Tandem Stance  - 1 x daily - 7 x weekly - 2 sets - 45 sec hold - Standing Single Leg Stance with Counter Support  - 1 x daily - 7 x weekly - 3 sets - 30 sec  hold - standing in split stance with vertical head nods   - 1 x daily - 7 x weekly - 3 sets - 10 reps - romberg stance with head turns laterally   - 1 x daily - 7 x weekly - 2 sets - 10 reps  GOALS: Goals reviewed with patient? Yes  SHORT TERM GOALS: Target date: 08/25/2022      Patient will be independent in home exercise program to improve strength/mobility for better functional independence with ADLs. Baseline: No HEP currently  Goal status: INITIAL     LONG TERM GOALS: Target date: 10/20/2022   1.  Patient (> 11 years old) will complete five times sit to stand test in < 13 seconds indicating an increased LE strength and improved balance. Baseline: 15.77 sec Goal status: INITIAL  2.  Patient will increase FOTO score to equal to or greater than  68   to demonstrate statistically significant improvement in mobility and  quality of life.  Baseline: 63 Goal status: INITIAL   3.  Patient will increase mini BEST Balance score by > 4 points to demonstrate decreased fall risk during functional activities. Baseline: 17 Goal status: INITIAL      ASSESSMENT:  CLINICAL IMPRESSION:  Continued with current plan of care as laid out in evaluation and recent prior sessions. Pt put forth good effort through treatment to address strength and balance deficits. Pt continues to demonstrate decreased balance strategies for SLS and tandem positions. Pt continues to have mild impulsivity with dynamic gait and balance tasks requiring min assist to correct LOB twice. But denies need for assist with LOB. Pt would continue to benefit from skilled PT to improve balance, safety, reduce fall risk, improve QoL.   OBJECTIVE IMPAIRMENTS: Abnormal gait and decreased balance.   ACTIVITY LIMITATIONS: standing, squatting, and stairs  PARTICIPATION LIMITATIONS: community activity and yard work  PERSONAL FACTORS: Age, Time since onset of injury/illness/exacerbation, and 3+ comorbidities: THA, CKD, HLD, Atherosclerosis  are also affecting patient's functional outcome.   REHAB POTENTIAL: Excellent  CLINICAL DECISION MAKING: Stable/uncomplicated  EVALUATION COMPLEXITY: Low  PLAN:  PT FREQUENCY: 2x/week  PT DURATION: 12 weeks  PLANNED INTERVENTIONS: Therapeutic exercises, Therapeutic activity, Neuromuscular re-education, Balance training, Gait training, Patient/Family education, Self Care, Joint mobilization, Manual therapy, and Re-evaluation  PLAN FOR NEXT SESSION:   high level static balance activities and progression to dynamic as appropriate. BLE hip strengthening.   Grier Rocher PT, DPT  Physical Therapist - [facility]  4:49 PM 08/10/22

## 2022-08-12 ENCOUNTER — Ambulatory Visit: Payer: Medicare HMO | Admitting: Physical Therapy

## 2022-08-12 DIAGNOSIS — R2689 Other abnormalities of gait and mobility: Secondary | ICD-10-CM | POA: Diagnosis not present

## 2022-08-12 DIAGNOSIS — R269 Unspecified abnormalities of gait and mobility: Secondary | ICD-10-CM

## 2022-08-12 DIAGNOSIS — M6281 Muscle weakness (generalized): Secondary | ICD-10-CM

## 2022-08-12 DIAGNOSIS — R262 Difficulty in walking, not elsewhere classified: Secondary | ICD-10-CM | POA: Diagnosis not present

## 2022-08-12 NOTE — Therapy (Signed)
OUTPATIENT PHYSICAL THERAPY NEURO treatment   Patient Name: Garrett Moore MRN: [id number] DOB:07/20/1940, 82 y.o., male Today's Date: 08/12/2022   PCP: Daisy Floro, MD  REFERRING PROVIDER: Daisy Floro, MD   END OF SESSION:  PT End of Session - 08/12/22 1115     Visit Number 6    Number of Visits 16    Date for PT Re-Evaluation 09/22/22    Progress Note Due on Visit 10    PT Start Time 1117    PT Stop Time 1145    PT Time Calculation (min) 28 min    Equipment Utilized During Treatment Gait belt    Activity Tolerance Patient tolerated treatment well    Behavior During Therapy [facility] for tasks assessed/performed              Past Medical History:  Diagnosis Date   Aneurysm of infrarenal abdominal aorta (HCC) 02/08/2018   a.) TTE 02/08/2018: Ao root 38 mm, asc Ao 40 mm; b.) TTE 06/14/2020; asc Ao 28 mm; c.) CT hematuria 05/10/2020: infrarenal AAA measuring 5.9 cm; d.) s/p EVAR 07/03/2020; e.) CT A/P 10/14/2021: residual aneurysmal sac (s/p EVAR) measured 6.0 cm   Aortic atherosclerosis (HCC)    Aortic valvar stenosis 07/21/2012   a.) TTE 07/21/2012: EF 50-55%, mild AS (AVA = 1.67, MPG 9); b.) TTE 02/08/2018: EF 60-65%; mod AS (MPG 17); c.) TTE 06/14/2020: EF 60-65%, mod AS (MPG 26.8)   Atherosclerosis of native arteries of extremity with intermittent claudication (HCC)    Basal cell carcinoma, face    BPH (benign prostatic hyperplasia)    Carotid artery stenosis 05/21/2006   a.) carotid US 05/21/2006: 0-49% BICA; b.) carotid US 06/10/2020: 1-39% RICA, total occ LICA   CKD (chronic kidney disease), stage III (HCC)    COPD (chronic obstructive pulmonary disease) (HCC)    Coronary artery disease 08/14/1983   a.) MI --> LHC 08/13/2016: CTO of the RCA --> PTCA performed resulting in 30% residual stenosis; aberrant takeoff of his RCA coming off somewhat high and posteriorly.   Diastolic dysfunction 02/08/2018   a.) TTE 02/08/2018: EF 60-65%, mild MAC,  triv TR,  mild-mod AS, G1DD; b.) TTE 06/14/2020: EF 60-65%, mod AS, triv TR, mild AR, G1DD   Diverticulosis    Family history of adverse reaction to anesthesia    a.) extended GA course for mother --> postoperaitve memory problems/dementia   GERD (gastroesophageal reflux disease)    Glaucoma    Heart murmur    History of kidney stones    Hyperlipidemia    Iliac artery aneurysm, bilateral (HCC) 07/03/2020   a.) s/p insertion of BILATERAL iliac artery stents   Myocardial infarction (HCC) 08/13/2016   a.) MI --> LHC 08/13/2016: CTO of the RCA --> PTCA performed resulting in 30% residual stenosis. Procedure complicated by procedural nausea, Patient went into IVR and then AV disaccociated rhythm. SBP dropped (80s). TVP floated with capture, which stabilized patient. ICU admission overnight.   OA (osteoarthritis)    Peripheral vascular disease (HCC)    Postoperative deep vein thrombosis (DVT) (HCC)    a.) postop age 34 following tonsillectomy (per patient report)   Past Surgical History:  Procedure Laterality Date   CATARACT EXTRACTION, BILATERAL  01/2019   CORONARY ANGIOPLASTY  08/14/1983   Procedure: CORONARY ANGIOPLASTY (PTCA pRCA); Location: [facility]; Surgeon: Al Little, MD   CYSTOSCOPY W/ RETROGRADES  08/12/2020   Procedure: CYSTOSCOPY WITH RETROGRADE PYELOGRAM;  Surgeon: Vanna Scotland, MD;  Location: [facility];  Service: Urology;;   CYSTOSCOPY WITH INSERTION OF UROLIFT N/A 12/22/2021   Procedure: CYSTOSCOPY WITH INSERTION OF UROLIFT;  Surgeon: Vanna Scotland, MD;  Location: [facility];  Service: Urology;  Laterality: N/A;   CYSTOSCOPY/URETEROSCOPY/HOLMIUM LASER/STENT PLACEMENT Left 08/12/2020   Procedure: CYSTOSCOPY/URETEROSCOPY/HOLMIUM LASER/STENT PLACEMENT;  Surgeon: Vanna Scotland, MD;  Location: [facility];  Service: Urology;  Laterality: Left;   ENDOVASCULAR REPAIR/STENT GRAFT N/A 07/03/2020   Procedure: ENDOVASCULAR REPAIR/STENT GRAFT;  Surgeon: Renford Dills, MD;  Location: [facility]  INVASIVE CV LAB;  Service: Cardiovascular;  Laterality: N/A;   PELVIC ANGIOGRAPHY Right 10/14/2021   Procedure: PELVIC ANGIOGRAPHY;  Surgeon: Renford Dills, MD;  Location: [facility] INVASIVE CV [facility];  Service: Cardiovascular;  Laterality: Right;   PERIPHERAL VASCULAR THROMBECTOMY Right 10/14/2021   Procedure: PERIPHERAL VASCULAR THROMBECTOMY;  Surgeon: Renford Dills, MD;  Location: [facility] INVASIVE CV LAB;  Service: Cardiovascular;  Laterality: Right;   ROTATOR CUFF REPAIR Left    TEMPORARY PACEMAKER  08/14/1983   Procedure: TEMPORARY PACEMAKER PLACEMENT; Location: [facility]; Surgeon: Al Little, MD   TONSILLECTOMY     TOTAL HIP ARTHROPLASTY Left 02/21/2019   Procedure: TOTAL HIP ARTHROPLASTY ANTERIOR APPROACH;  Surgeon: Durene Romans, MD;  Location: [facility];  Service: Orthopedics;  Laterality: Left;  70 mins   VITRECTOMY Bilateral    Patient Active Problem List   Diagnosis Date Noted   Moderate aortic stenosis 05/20/2022   Chronic diastolic CHF (congestive heart failure) (HCC) 05/20/2022   Paroxysmal atrial fibrillation (HCC) 05/20/2022   Vision loss of left eye 05/20/2022   Acute ischemic stroke (HCC) 05/19/2022   Atherosclerosis of native arteries of extremity with intermittent claudication (HCC) 09/28/2021   Aortic valve disease 08/05/2020   Atherosclerotic heart disease of native coronary artery without angina pectoris 08/05/2020   Basal cell carcinoma of skin 08/05/2020   Benign prostatic hyperplasia 08/05/2020   Stage 3a chronic kidney disease (CKD) (HCC) 08/05/2020   COPD (chronic obstructive pulmonary disease) (HCC) 08/05/2020   Frequent fecal incontinence 08/05/2020   Glaucoma 08/05/2020   Hyperlipidemia 08/05/2020   Other long term (current) drug therapy 08/05/2020   AAA (abdominal aortic aneurysm) (HCC) 07/03/2020   AAA (abdominal aortic aneurysm) without rupture (HCC) 05/26/2020   S/P left THA, AA 02/21/2019   Status post total hip replacement, left 02/21/2019     ONSET DATE: 08/21/21  REFERRING DIAG: R26.89 (ICD-10-CM) - Balance problem   THERAPY DIAG:  Abnormality of gait and mobility  Other abnormalities of gait and mobility  Muscle weakness (generalized)  Difficulty in walking, not elsewhere classified  Rationale for Evaluation and Treatment: Rehabilitation  SUBJECTIVE:  SUBJECTIVE STATEMENT:  PT called pt for missed start of therapy session. Pt was unaware of scheduled PT session, but was on [facility] campus, taking wife to Cardiac rehab. Agreeable to abbreviated PT treatment on this day. Once pt reports to therapy department, No pain reported on this day    Pt accompanied by: self  PERTINENT HISTORY: Pt reports he noticed he has trouble when he first gets up and he tends to stumble, he recalls no dizziness when he gets up or lightheadedness. He recalls no falls in the last 6 months but has had many stumbles where he has caught himself. Pt went to ER following 1 minute of visual changes where he could not see out of his left eye, no changes in his visual field when he was just looking out of his right eye. No significant findings of stroke per ER report and notes and per pt report. Eye doctor also does not know why the acute visual change occurred.  Pt main goal for PT is to improve his balance. Pt reports he favors his left leg per report of observers.   PAIN:  Are you having pain? No  PRECAUTIONS: None  WEIGHT BEARING RESTRICTIONS: No  FALLS: Has patient fallen in last 6 months? No  LIVING ENVIRONMENT: Lives with: lives with their family and lives with their spouse Lives in: House/apartment Stairs: No Has following equipment at home: Single point cane and only uses cane if his legs are stiff on a given day.   PLOF: Independent  PATIENT GOALS:  Improve his balance  OBJECTIVE: (objective measures completed at initial evaluation unless otherwise dated)   DIAGNOSTIC FINDINGS: N/A, no acute findings for CVA or other abnormalities at ER in February   COGNITION: Overall cognitive status: Within functional limits for tasks assessed   SENSATION: Not tested  COORDINATION: WNL   POSTURE: No Significant postural limitations   LOWER EXTREMITY MMT:    MMT Right Eval Left Eval  Hip flexion 5 5  Hip extension    Hip abduction 5 5  Hip adduction 5 5  Hip internal rotation 5 5  Hip external rotation 5 5  Knee flexion 5 5  Knee extension 4+ 5  Ankle dorsiflexion 5 5  Ankle plantarflexion 4+ 4+  Ankle inversion    Ankle eversion    (Blank rows = not tested) All tested in seated position   STAIRS: Level of Assistance: Complete Independence Stair Negotiation Technique: Alternating Pattern  with No Rails Number of Stairs: 4  Height of Stairs: 6 in  Comments: some hesitancy with descending steps   GAIT: Gait pattern:  With quick turnaround and pivot turn patient demonstrates poor single-leg stance stability and also utilizes several narrow base of support steps to recover balance from this acute loss of balance.  and step through pattern Distance walked: 30 ft Assistive device utilized: None Level of assistance: Complete Independence Comments: Difficulty with more dynamic balance tasks with simple ambulation no significant errors or limitations.  FUNCTIONAL TESTS:  5 times sit to stand: 15.77 sec  MiniBEST      PATIENT SURVEYS:  FOTO 63, risk adjusted goal of 64 ( will set goal higher to ensure legitimate change but will be lofty goal due to high initial score)   TODAY'S TREATMENT:  DATE:   Blazepods  Activity Description: The Blaze Pod Sequence setting was selected to enhance cognitive  function and motor skills, providing a structured environment to follow a specific pattern and improve memory, coordination, and movement sequences.  Activity Setting:  sequence on 5inch step  Number of Pods:  3  Cycles/Sets:  4 (x 2 BLE)  Duration (Time or Hit Count):   Pt performed with no UE support and min assist from PT. Instructed to return foot to floor after each cycle, but performed standing on 1 LE throughout entire minute for each bout. Increased assist from PT for SLS on LLE compare to RLE.  Modified semitandem: Static stand 1 LE on ariex pad with 1 foot on air disk on 5inch step 2 x 20 sec bil   1 foot on airex, 1 foot on 5inch step performed sorting task or colored letters on white board 2x 15 Bil then 2x 10 Bil.   Ball toss while standing on airex pad. Forward x 15, lateral x 10 bil. CGA-min assist for safety and improved awareness of posterior or lateral LOB and cues for adequate force of toss to reduce fall risk and minimizing reach outside BOS.  Min assist for safety for all tasks performed on airex pad or in SLS with moderate multimodal cues for awareness of LOB and use of ankle strategy to prevent lateral LOB. Intermittent UE support on rail to correct lateral LOB in modified tandem.     PATIENT EDUCATION: Education details: POC Person educated: Patient Education method: Explanation Education comprehension: verbalized understanding  HOME EXERCISE PROGRAM: Access Code: [email or web address] URL: [email or web address] Date: 07/29/2022 Prepared by: Thresa Ross  Exercises - Standing Marching  - 1 x daily - 5 x weekly - 1 sets - 10 reps - 3 second hold - Standing Romberg to 3/4 Tandem Stance  - 1 x daily - 7 x weekly - 2 sets - 45 sec hold - Standing Single Leg Stance with Counter Support  - 1 x daily - 7 x weekly - 3 sets - 30 sec  hold - standing in split stance with vertical head nods   - 1 x daily - 7 x weekly - 3 sets - 10 reps - romberg stance with  head turns laterally   - 1 x daily - 7 x weekly - 2 sets - 10 reps  GOALS: Goals reviewed with patient? Yes  SHORT TERM GOALS: Target date: 08/25/2022      Patient will be independent in home exercise program to improve strength/mobility for better functional independence with ADLs. Baseline: No HEP currently  Goal status: INITIAL     LONG TERM GOALS: Target date: 10/20/2022   1.  Patient (> 54 years old) will complete five times sit to stand test in < 13 seconds indicating an increased LE strength and improved balance. Baseline: 15.77 sec Goal status: INITIAL  2.  Patient will increase FOTO score to equal to or greater than  68   to demonstrate statistically significant improvement in mobility and quality of life.  Baseline: 63 Goal status: INITIAL   3.  Patient will increase mini BEST Balance score by > 4 points to demonstrate decreased fall risk during functional activities. Baseline: 17 Goal status: INITIAL      ASSESSMENT:  CLINICAL IMPRESSION:  Continued with current plan of care as laid out in evaluation and recent prior sessions.session limited by pt arriving late to scheduled therapy. Pt put forth good effort through treatment to address strength  and balance deficits. Pt continues to demonstrate decreased balance strategies for SLS and tandem positions. Pt continues to have mild impulsivity with dynamic gait and balance tasks requiring min assist to correct LOB twice. But denies need for assist with LOB. Pt would continue to benefit from skilled PT to improve balance, safety, reduce fall risk, improve QoL.   OBJECTIVE IMPAIRMENTS: Abnormal gait and decreased balance.   ACTIVITY LIMITATIONS: standing, squatting, and stairs  PARTICIPATION LIMITATIONS: community activity and yard work  PERSONAL FACTORS: Age, Time since onset of injury/illness/exacerbation, and 3+ comorbidities: THA, CKD, HLD, Atherosclerosis  are also affecting patient's functional outcome.   REHAB  POTENTIAL: Excellent  CLINICAL DECISION MAKING: Stable/uncomplicated  EVALUATION COMPLEXITY: Low  PLAN:  PT FREQUENCY: 2x/week  PT DURATION: 12 weeks  PLANNED INTERVENTIONS: Therapeutic exercises, Therapeutic activity, Neuromuscular re-education, Balance training, Gait training, Patient/Family education, Self Care, Joint mobilization, Manual therapy, and Re-evaluation  PLAN FOR NEXT SESSION:   high level static balance activities and progression to dynamic as appropriate. BLE hip strengthening.   Grier Rocher PT, DPT  Physical Therapist - [place]  [facility]  1:20 PM 08/12/22

## 2022-08-13 ENCOUNTER — Ambulatory Visit: Payer: Medicare HMO | Admitting: Physical Therapy

## 2022-08-18 ENCOUNTER — Ambulatory Visit: Payer: Medicare HMO | Admitting: Physical Therapy

## 2022-08-18 DIAGNOSIS — R2689 Other abnormalities of gait and mobility: Secondary | ICD-10-CM

## 2022-08-18 DIAGNOSIS — R262 Difficulty in walking, not elsewhere classified: Secondary | ICD-10-CM | POA: Diagnosis not present

## 2022-08-18 DIAGNOSIS — M6281 Muscle weakness (generalized): Secondary | ICD-10-CM | POA: Diagnosis not present

## 2022-08-18 DIAGNOSIS — R269 Unspecified abnormalities of gait and mobility: Secondary | ICD-10-CM

## 2022-08-18 NOTE — Therapy (Signed)
OUTPATIENT PHYSICAL THERAPY NEURO treatment   Patient Name: Garrett Moore MRN: [id number] DOB:Dec 20, 1940, 82 y.o., male Today's Date: 08/18/2022   PCP: Daisy Floro, MD  REFERRING PROVIDER: Daisy Floro, MD   END OF SESSION:  PT End of Session - 08/18/22 1107     Visit Number 7    Number of Visits 16    Date for PT Re-Evaluation 09/22/22    Progress Note Due on Visit 10    PT Start Time 1106    PT Stop Time 1145    PT Time Calculation (min) 39 min    Equipment Utilized During Treatment Gait belt    Activity Tolerance Patient tolerated treatment well    Behavior During Therapy [facility] for tasks assessed/performed              Past Medical History:  Diagnosis Date   Aneurysm of infrarenal abdominal aorta (HCC) 02/08/2018   a.) TTE 02/08/2018: Ao root 38 mm, asc Ao 40 mm; b.) TTE 06/14/2020; asc Ao 28 mm; c.) CT hematuria 05/10/2020: infrarenal AAA measuring 5.9 cm; d.) s/p EVAR 07/03/2020; e.) CT A/P 10/14/2021: residual aneurysmal sac (s/p EVAR) measured 6.0 cm   Aortic atherosclerosis (HCC)    Aortic valvar stenosis 07/21/2012   a.) TTE 07/21/2012: EF 50-55%, mild AS (AVA = 1.67, MPG 9); b.) TTE 02/08/2018: EF 60-65%; mod AS (MPG 17); c.) TTE 06/14/2020: EF 60-65%, mod AS (MPG 26.8)   Atherosclerosis of native arteries of extremity with intermittent claudication (HCC)    Basal cell carcinoma, face    BPH (benign prostatic hyperplasia)    Carotid artery stenosis 05/21/2006   a.) carotid US 05/21/2006: 0-49% BICA; b.) carotid US 06/10/2020: 1-39% RICA, total occ LICA   CKD (chronic kidney disease), stage III (HCC)    COPD (chronic obstructive pulmonary disease) (HCC)    Coronary artery disease 08/14/1983   a.) MI --> LHC 08/13/2016: CTO of the RCA --> PTCA performed resulting in 30% residual stenosis; aberrant takeoff of his RCA coming off somewhat high and posteriorly.   Diastolic dysfunction 02/08/2018   a.) TTE 02/08/2018: EF 60-65%, mild MAC,  triv TR,  mild-mod AS, G1DD; b.) TTE 06/14/2020: EF 60-65%, mod AS, triv TR, mild AR, G1DD   Diverticulosis    Family history of adverse reaction to anesthesia    a.) extended GA course for mother --> postoperaitve memory problems/dementia   GERD (gastroesophageal reflux disease)    Glaucoma    Heart murmur    History of kidney stones    Hyperlipidemia    Iliac artery aneurysm, bilateral (HCC) 07/03/2020   a.) s/p insertion of BILATERAL iliac artery stents   Myocardial infarction (HCC) 08/13/2016   a.) MI --> LHC 08/13/2016: CTO of the RCA --> PTCA performed resulting in 30% residual stenosis. Procedure complicated by procedural nausea, Patient went into IVR and then AV disaccociated rhythm. SBP dropped (80s). TVP floated with capture, which stabilized patient. ICU admission overnight.   OA (osteoarthritis)    Peripheral vascular disease (HCC)    Postoperative deep vein thrombosis (DVT) (HCC)    a.) postop age 57 following tonsillectomy (per patient report)   Past Surgical History:  Procedure Laterality Date   CATARACT EXTRACTION, BILATERAL  01/2019   CORONARY ANGIOPLASTY  08/14/1983   Procedure: CORONARY ANGIOPLASTY (PTCA pRCA); Location: [facility]; Surgeon: Al Little, MD   CYSTOSCOPY W/ RETROGRADES  08/12/2020   Procedure: CYSTOSCOPY WITH RETROGRADE PYELOGRAM;  Surgeon: Vanna Scotland, MD;  Location: [facility];  Service: Urology;;   CYSTOSCOPY WITH INSERTION OF UROLIFT N/A 12/22/2021   Procedure: CYSTOSCOPY WITH INSERTION OF UROLIFT;  Surgeon: Vanna Scotland, MD;  Location: [facility];  Service: Urology;  Laterality: N/A;   CYSTOSCOPY/URETEROSCOPY/HOLMIUM LASER/STENT PLACEMENT Left 08/12/2020   Procedure: CYSTOSCOPY/URETEROSCOPY/HOLMIUM LASER/STENT PLACEMENT;  Surgeon: Vanna Scotland, MD;  Location: [facility];  Service: Urology;  Laterality: Left;   ENDOVASCULAR REPAIR/STENT GRAFT N/A 07/03/2020   Procedure: ENDOVASCULAR REPAIR/STENT GRAFT;  Surgeon: Renford Dills, MD;  Location: [facility]  INVASIVE CV LAB;  Service: Cardiovascular;  Laterality: N/A;   PELVIC ANGIOGRAPHY Right 10/14/2021   Procedure: PELVIC ANGIOGRAPHY;  Surgeon: Renford Dills, MD;  Location: [facility] INVASIVE CV [facility];  Service: Cardiovascular;  Laterality: Right;   PERIPHERAL VASCULAR THROMBECTOMY Right 10/14/2021   Procedure: PERIPHERAL VASCULAR THROMBECTOMY;  Surgeon: Renford Dills, MD;  Location: [facility] INVASIVE CV LAB;  Service: Cardiovascular;  Laterality: Right;   ROTATOR CUFF REPAIR Left    TEMPORARY PACEMAKER  08/14/1983   Procedure: TEMPORARY PACEMAKER PLACEMENT; Location: [facility]; Surgeon: Al Little, MD   TONSILLECTOMY     TOTAL HIP ARTHROPLASTY Left 02/21/2019   Procedure: TOTAL HIP ARTHROPLASTY ANTERIOR APPROACH;  Surgeon: Durene Romans, MD;  Location: [facility];  Service: Orthopedics;  Laterality: Left;  70 mins   VITRECTOMY Bilateral    Patient Active Problem List   Diagnosis Date Noted   Moderate aortic stenosis 05/20/2022   Chronic diastolic CHF (congestive heart failure) (HCC) 05/20/2022   Paroxysmal atrial fibrillation (HCC) 05/20/2022   Vision loss of left eye 05/20/2022   Acute ischemic stroke (HCC) 05/19/2022   Atherosclerosis of native arteries of extremity with intermittent claudication (HCC) 09/28/2021   Aortic valve disease 08/05/2020   Atherosclerotic heart disease of native coronary artery without angina pectoris 08/05/2020   Basal cell carcinoma of skin 08/05/2020   Benign prostatic hyperplasia 08/05/2020   Stage 3a chronic kidney disease (CKD) (HCC) 08/05/2020   COPD (chronic obstructive pulmonary disease) (HCC) 08/05/2020   Frequent fecal incontinence 08/05/2020   Glaucoma 08/05/2020   Hyperlipidemia 08/05/2020   Other long term (current) drug therapy 08/05/2020   AAA (abdominal aortic aneurysm) (HCC) 07/03/2020   AAA (abdominal aortic aneurysm) without rupture (HCC) 05/26/2020   S/P left THA, AA 02/21/2019   Status post total hip replacement, left 02/21/2019     ONSET DATE: 08/21/21  REFERRING DIAG: R26.89 (ICD-10-CM) - Balance problem   THERAPY DIAG:  Abnormality of gait and mobility  Other abnormalities of gait and mobility  Muscle weakness (generalized)  Difficulty in walking, not elsewhere classified  Rationale for Evaluation and Treatment: Rehabilitation  SUBJECTIVE:  SUBJECTIVE STATEMENT:  No pain reported on this day. Pt reports slipping into pond trying to save a turtle. Noted mild abrasion on the R arm from fall into pond. No other pain or issue reported, pt did not seek medical treatment.    Pt accompanied by: self  PERTINENT HISTORY: Pt reports he noticed he has trouble when he first gets up and he tends to stumble, he recalls no dizziness when he gets up or lightheadedness. He recalls no falls in the last 6 months but has had many stumbles where he has caught himself. Pt went to ER following 1 minute of visual changes where he could not see out of his left eye, no changes in his visual field when he was just looking out of his right eye. No significant findings of stroke per ER report and notes and per pt report. Eye doctor also does not know why the acute visual change occurred.  Pt main goal for PT is to improve his balance. Pt reports he favors his left leg per report of observers.   PAIN:  Are you having pain? No  PRECAUTIONS: None  WEIGHT BEARING RESTRICTIONS: No  FALLS: Has patient fallen in last 6 months? No  LIVING ENVIRONMENT: Lives with: lives with their family and lives with their spouse Lives in: House/apartment Stairs: No Has following equipment at home: Single point cane and only uses cane if his legs are stiff on a given day.   PLOF: Independent  PATIENT GOALS: Improve his balance  OBJECTIVE: (objective measures  completed at initial evaluation unless otherwise dated)   DIAGNOSTIC FINDINGS: N/A, no acute findings for CVA or other abnormalities at ER in February   COGNITION: Overall cognitive status: Within functional limits for tasks assessed   SENSATION: Not tested  COORDINATION: WNL   POSTURE: No Significant postural limitations   LOWER EXTREMITY MMT:    MMT Right Eval Left Eval  Hip flexion 5 5  Hip extension    Hip abduction 5 5  Hip adduction 5 5  Hip internal rotation 5 5  Hip external rotation 5 5  Knee flexion 5 5  Knee extension 4+ 5  Ankle dorsiflexion 5 5  Ankle plantarflexion 4+ 4+  Ankle inversion    Ankle eversion    (Blank rows = not tested) All tested in seated position   STAIRS: Level of Assistance: Complete Independence Stair Negotiation Technique: Alternating Pattern  with No Rails Number of Stairs: 4  Height of Stairs: 6 in  Comments: some hesitancy with descending steps   GAIT: Gait pattern:  With quick turnaround and pivot turn patient demonstrates poor single-leg stance stability and also utilizes several narrow base of support steps to recover balance from this acute loss of balance.  and step through pattern Distance walked: 30 ft Assistive device utilized: None Level of assistance: Complete Independence Comments: Difficulty with more dynamic balance tasks with simple ambulation no significant errors or limitations.  FUNCTIONAL TESTS:  5 times sit to stand: 15.77 sec  MiniBEST      PATIENT SURVEYS:  FOTO 63, risk adjusted goal of 64 ( will set goal higher to ensure legitimate change but will be lofty goal due to high initial score)   TODAY'S TREATMENT:  DATE:     CGA provided by PT throughout session unless otherwise stated.   1 foot on 4inhc step:  Lateral Ball tap against wall to ipsilateral side x 10 bil.   Forward ball toss off board x 15 bil   Vertical ball toss x 12 bil  Cues for hip strategy with foot in elevated position to prevent lateral LOB  Reciprocal foot tap on 4inch step CGA from PT for safety and awareness of LOB with cues for decreased pain .   Standing on airex pad:  Ball toss off wall 2 x 30 sec  Normal BOS eyes open 30 sec /eyes closed 10 sec no UE support x  4 bouts  Narrow BOS eyes open 30 sec/eyes closed. 10 sec x 4 bouts SLS with Bil UE support x 15sec bil, 1 UE support 2x 15 sec bil  Cues for ankle strategy to reduce lateral and AP LOB.   Obstacle course management (side stepping on airex beam with light UE support, up/down 4inch step, over 4inch hurdle) performed x 6 with cues for ankle strategy over stepping strategy with LOB in obstacle coursed.    Resisted gait from Matrix cable machine 7.5 lbs, forward/reverse x 2 and lateral x 1 each with min assist for safety to improve eccentric control.        PATIENT EDUCATION: Education details: POC Person educated: Patient Education method: Explanation Education comprehension: verbalized understanding  HOME EXERCISE PROGRAM: Access Code: [email or web address] URL: [email or web address] Date: 07/29/2022 Prepared by: Thresa Ross  Exercises - Standing Marching  - 1 x daily - 5 x weekly - 1 sets - 10 reps - 3 second hold - Standing Romberg to 3/4 Tandem Stance  - 1 x daily - 7 x weekly - 2 sets - 45 sec hold - Standing Single Leg Stance with Counter Support  - 1 x daily - 7 x weekly - 3 sets - 30 sec  hold - standing in split stance with vertical head nods   - 1 x daily - 7 x weekly - 3 sets - 10 reps - romberg stance with head turns laterally   - 1 x daily - 7 x weekly - 2 sets - 10 reps  GOALS: Goals reviewed with patient? Yes  SHORT TERM GOALS: Target date: 08/25/2022      Patient will be independent in home exercise program to improve strength/mobility for better functional independence with  ADLs. Baseline: No HEP currently  Goal status: INITIAL     LONG TERM GOALS: Target date: 10/20/2022   1.  Patient (> 21 years old) will complete five times sit to stand test in < 13 seconds indicating an increased LE strength and improved balance. Baseline: 15.77 sec Goal status: INITIAL  2.  Patient will increase FOTO score to equal to or greater than  68   to demonstrate statistically significant improvement in mobility and quality of life.  Baseline: 63 Goal status: INITIAL   3.  Patient will increase mini BEST Balance score by > 4 points to demonstrate decreased fall risk during functional activities. Baseline: 17 Goal status: INITIAL      ASSESSMENT:  CLINICAL IMPRESSION:  Continued with current plan of care as laid out in evaluation and recent prior sessions. Pt put forth good effort through treatment to address strength and balance deficits. PT encouraging pt to utilize hip and ankle strategies to correct LOB. Mild improvement in use of ankle strategy on airex pad, but limited ability to utilize hip strategy  in modified semitandem. And decreased control in eccentric pattern in resisted gait.   Pt would continue to benefit from skilled PT to improve balance, safety, reduce fall risk, improve QoL.   OBJECTIVE IMPAIRMENTS: Abnormal gait and decreased balance.   ACTIVITY LIMITATIONS: standing, squatting, and stairs  PARTICIPATION LIMITATIONS: community activity and yard work  PERSONAL FACTORS: Age, Time since onset of injury/illness/exacerbation, and 3+ comorbidities: THA, CKD, HLD, Atherosclerosis  are also affecting patient's functional outcome.   REHAB POTENTIAL: Excellent  CLINICAL DECISION MAKING: Stable/uncomplicated  EVALUATION COMPLEXITY: Low  PLAN:  PT FREQUENCY: 2x/week  PT DURATION: 12 weeks  PLANNED INTERVENTIONS: Therapeutic exercises, Therapeutic activity, Neuromuscular re-education, Balance training, Gait training, Patient/Family education, Self  Care, Joint mobilization, Manual therapy, and Re-evaluation  PLAN FOR NEXT SESSION:   high level static balance activities and progression to dynamic as appropriate.  Agility ladder, resisted gait,  BLE hip strengthening.     Grier Rocher PT, DPT  Physical Therapist - [facility]  12:02 PM 08/18/22

## 2022-08-24 ENCOUNTER — Ambulatory Visit: Payer: Medicare HMO | Attending: Family Medicine | Admitting: Physical Therapy

## 2022-08-24 ENCOUNTER — Encounter: Payer: Self-pay | Admitting: Physical Therapy

## 2022-08-24 DIAGNOSIS — R2689 Other abnormalities of gait and mobility: Secondary | ICD-10-CM | POA: Diagnosis not present

## 2022-08-24 DIAGNOSIS — R2681 Unsteadiness on feet: Secondary | ICD-10-CM | POA: Diagnosis not present

## 2022-08-24 DIAGNOSIS — M6281 Muscle weakness (generalized): Secondary | ICD-10-CM | POA: Insufficient documentation

## 2022-08-24 DIAGNOSIS — R269 Unspecified abnormalities of gait and mobility: Secondary | ICD-10-CM | POA: Diagnosis not present

## 2022-08-24 DIAGNOSIS — R262 Difficulty in walking, not elsewhere classified: Secondary | ICD-10-CM | POA: Insufficient documentation

## 2022-08-24 NOTE — Therapy (Signed)
OUTPATIENT PHYSICAL THERAPY NEURO treatment   Patient Name: Garrett Moore MRN: [id number] DOB:Aug 21, 1940, 82 y.o., male Today's Date: 08/24/2022   PCP: Daisy Floro, MD  REFERRING PROVIDER: Daisy Floro, MD   END OF SESSION:  PT End of Session - 08/24/22 1104     Visit Number 8    Number of Visits 16    Date for PT Re-Evaluation 09/22/22    Progress Note Due on Visit 10    PT Start Time 1104    PT Stop Time 1146    PT Time Calculation (min) 42 min    Equipment Utilized During Treatment Gait belt    Activity Tolerance Patient tolerated treatment well    Behavior During Therapy [facility] for tasks assessed/performed              Past Medical History:  Diagnosis Date   Aneurysm of infrarenal abdominal aorta (HCC) 02/08/2018   a.) TTE 02/08/2018: Ao root 38 mm, asc Ao 40 mm; b.) TTE 06/14/2020; asc Ao 28 mm; c.) CT hematuria 05/10/2020: infrarenal AAA measuring 5.9 cm; d.) s/p EVAR 07/03/2020; e.) CT A/P 10/14/2021: residual aneurysmal sac (s/p EVAR) measured 6.0 cm   Aortic atherosclerosis (HCC)    Aortic valvar stenosis 07/21/2012   a.) TTE 07/21/2012: EF 50-55%, mild AS (AVA = 1.67, MPG 9); b.) TTE 02/08/2018: EF 60-65%; mod AS (MPG 17); c.) TTE 06/14/2020: EF 60-65%, mod AS (MPG 26.8)   Atherosclerosis of native arteries of extremity with intermittent claudication (HCC)    Basal cell carcinoma, face    BPH (benign prostatic hyperplasia)    Carotid artery stenosis 05/21/2006   a.) carotid US 05/21/2006: 0-49% BICA; b.) carotid US 06/10/2020: 1-39% RICA, total occ LICA   CKD (chronic kidney disease), stage III (HCC)    COPD (chronic obstructive pulmonary disease) (HCC)    Coronary artery disease 08/14/1983   a.) MI --> LHC 08/13/2016: CTO of the RCA --> PTCA performed resulting in 30% residual stenosis; aberrant takeoff of his RCA coming off somewhat high and posteriorly.   Diastolic dysfunction 02/08/2018   a.) TTE 02/08/2018: EF 60-65%, mild MAC,  triv TR,  mild-mod AS, G1DD; b.) TTE 06/14/2020: EF 60-65%, mod AS, triv TR, mild AR, G1DD   Diverticulosis    Family history of adverse reaction to anesthesia    a.) extended GA course for mother --> postoperaitve memory problems/dementia   GERD (gastroesophageal reflux disease)    Glaucoma    Heart murmur    History of kidney stones    Hyperlipidemia    Iliac artery aneurysm, bilateral (HCC) 07/03/2020   a.) s/p insertion of BILATERAL iliac artery stents   Myocardial infarction (HCC) 08/13/2016   a.) MI --> LHC 08/13/2016: CTO of the RCA --> PTCA performed resulting in 30% residual stenosis. Procedure complicated by procedural nausea, Patient went into IVR and then AV disaccociated rhythm. SBP dropped (80s). TVP floated with capture, which stabilized patient. ICU admission overnight.   OA (osteoarthritis)    Peripheral vascular disease (HCC)    Postoperative deep vein thrombosis (DVT) (HCC)    a.) postop age 29 following tonsillectomy (per patient report)   Past Surgical History:  Procedure Laterality Date   CATARACT EXTRACTION, BILATERAL  01/2019   CORONARY ANGIOPLASTY  08/14/1983   Procedure: CORONARY ANGIOPLASTY (PTCA pRCA); Location: [facility]; Surgeon: Al Little, MD   CYSTOSCOPY W/ RETROGRADES  08/12/2020   Procedure: CYSTOSCOPY WITH RETROGRADE PYELOGRAM;  Surgeon: Vanna Scotland, MD;  Location: [facility];  Service: Urology;;   CYSTOSCOPY WITH INSERTION OF UROLIFT N/A 12/22/2021   Procedure: CYSTOSCOPY WITH INSERTION OF UROLIFT;  Surgeon: Vanna Scotland, MD;  Location: [facility];  Service: Urology;  Laterality: N/A;   CYSTOSCOPY/URETEROSCOPY/HOLMIUM LASER/STENT PLACEMENT Left 08/12/2020   Procedure: CYSTOSCOPY/URETEROSCOPY/HOLMIUM LASER/STENT PLACEMENT;  Surgeon: Vanna Scotland, MD;  Location: [facility];  Service: Urology;  Laterality: Left;   ENDOVASCULAR REPAIR/STENT GRAFT N/A 07/03/2020   Procedure: ENDOVASCULAR REPAIR/STENT GRAFT;  Surgeon: Renford Dills, MD;  Location: [facility]  INVASIVE CV LAB;  Service: Cardiovascular;  Laterality: N/A;   PELVIC ANGIOGRAPHY Right 10/14/2021   Procedure: PELVIC ANGIOGRAPHY;  Surgeon: Renford Dills, MD;  Location: [facility] INVASIVE CV LAB;  Service: Cardiovascular;  Laterality: Right;   PERIPHERAL VASCULAR THROMBECTOMY Right 10/14/2021   Procedure: PERIPHERAL VASCULAR THROMBECTOMY;  Surgeon: Renford Dills, MD;  Location: [facility] INVASIVE CV LAB;  Service: Cardiovascular;  Laterality: Right;   ROTATOR CUFF REPAIR Left    TEMPORARY PACEMAKER  08/14/1983   Procedure: TEMPORARY PACEMAKER PLACEMENT; Location: [facility]; Surgeon: Al Little, MD   TONSILLECTOMY     TOTAL HIP ARTHROPLASTY Left 02/21/2019   Procedure: TOTAL HIP ARTHROPLASTY ANTERIOR APPROACH;  Surgeon: Durene Romans, MD;  Location: [facility];  Service: Orthopedics;  Laterality: Left;  70 mins   VITRECTOMY Bilateral    Patient Active Problem List   Diagnosis Date Noted   Moderate aortic stenosis 05/20/2022   Chronic diastolic CHF (congestive heart failure) (HCC) 05/20/2022   Paroxysmal atrial fibrillation (HCC) 05/20/2022   Vision loss of left eye 05/20/2022   Acute ischemic stroke (HCC) 05/19/2022   Atherosclerosis of native arteries of extremity with intermittent claudication (HCC) 09/28/2021   Aortic valve disease 08/05/2020   Atherosclerotic heart disease of native coronary artery without angina pectoris 08/05/2020   Basal cell carcinoma of skin 08/05/2020   Benign prostatic hyperplasia 08/05/2020   Stage 3a chronic kidney disease (CKD) (HCC) 08/05/2020   COPD (chronic obstructive pulmonary disease) (HCC) 08/05/2020   Frequent fecal incontinence 08/05/2020   Glaucoma 08/05/2020   Hyperlipidemia 08/05/2020   Other long term (current) drug therapy 08/05/2020   AAA (abdominal aortic aneurysm) (HCC) 07/03/2020   AAA (abdominal aortic aneurysm) without rupture (HCC) 05/26/2020   S/P left THA, AA 02/21/2019   Status post total hip replacement, left 02/21/2019     ONSET DATE: 08/21/21  REFERRING DIAG: R26.89 (ICD-10-CM) - Balance problem   THERAPY DIAG:  Abnormality of gait and mobility  Other abnormalities of gait and mobility  Muscle weakness (generalized)  Difficulty in walking, not elsewhere classified  Rationale for Evaluation and Treatment: Rehabilitation  SUBJECTIVE:  SUBJECTIVE STATEMENT:  Patient reports that he is doing well.  States that he feels like he may be improving his balance slightly, but consistently states that he will "never have the balance he had in his 20s".   Pt accompanied by: self  PERTINENT HISTORY: Pt reports he noticed he has trouble when he first gets up and he tends to stumble, he recalls no dizziness when he gets up or lightheadedness. He recalls no falls in the last 6 months but has had many stumbles where he has caught himself. Pt went to ER following 1 minute of visual changes where he could not see out of his left eye, no changes in his visual field when he was just looking out of his right eye. No significant findings of stroke per ER report and notes and per pt report. Eye doctor also does not know why the acute visual change occurred.  Pt main goal for PT is to improve his balance. Pt reports he favors his left leg per report of observers.   PAIN:  Are you having pain? No  PRECAUTIONS: None  WEIGHT BEARING RESTRICTIONS: No  FALLS: Has patient fallen in last 6 months? No  LIVING ENVIRONMENT: Lives with: lives with their family and lives with their spouse Lives in: House/apartment Stairs: No Has following equipment at home: Single point cane and only uses cane if his legs are stiff on a given day.   PLOF: Independent  PATIENT GOALS: Improve his balance  OBJECTIVE: (objective measures completed at initial  evaluation unless otherwise dated)   DIAGNOSTIC FINDINGS: N/A, no acute findings for CVA or other abnormalities at ER in February   COGNITION: Overall cognitive status: Within functional limits for tasks assessed   SENSATION: Not tested  COORDINATION: WNL   POSTURE: No Significant postural limitations   LOWER EXTREMITY MMT:    MMT Right Eval Left Eval  Hip flexion 5 5  Hip extension    Hip abduction 5 5  Hip adduction 5 5  Hip internal rotation 5 5  Hip external rotation 5 5  Knee flexion 5 5  Knee extension 4+ 5  Ankle dorsiflexion 5 5  Ankle plantarflexion 4+ 4+  Ankle inversion    Ankle eversion    (Blank rows = not tested) All tested in seated position   STAIRS: Level of Assistance: Complete Independence Stair Negotiation Technique: Alternating Pattern  with No Rails Number of Stairs: 4  Height of Stairs: 6 in  Comments: some hesitancy with descending steps   GAIT: Gait pattern:  With quick turnaround and pivot turn patient demonstrates poor single-leg stance stability and also utilizes several narrow base of support steps to recover balance from this acute loss of balance.  and step through pattern Distance walked: 30 ft Assistive device utilized: None Level of assistance: Complete Independence Comments: Difficulty with more dynamic balance tasks with simple ambulation no significant errors or limitations.  FUNCTIONAL TESTS:  5 times sit to stand: 15.77 sec  MiniBEST      PATIENT SURVEYS:  FOTO 63, risk adjusted goal of 64 ( will set goal higher to ensure legitimate change but will be lofty goal due to high initial score)   TODAY'S TREATMENT:  DATE:     Nustep BLE/BLE  Aiex beam  Tandem stance 3 x 10 seconds bilateral Tandem gait 5 feet x 8 Side stepping 5 feet x 4 bilateral Intermittent use of upper extremity support  to prevent loss of balance with cues from PT to improve use of hip strategy to prevent loss of balance as appropriate  Foot tap on 1 cone, x 10 bilateral Foot tap on 2 cones x 10 bilateral Contact-guard assist from PT cues for reduced upper extremity support and use of hip strategy to prevent loss of balance  Agility ladder  1 foot in each space x 6 Sidestepping right and left x 3 bilaterally Single-leg stance with 3-second hold x 4 Contact-guard min assist provided from PT to reduce loss of balance and improve awareness of lateral hip instability.     PATIENT EDUCATION: Education details: POC; Pt educated throughout session about proper posture and technique with exercises. Improved exercise technique, movement at target joints, use of target muscles after min to mod verbal, visual, tactile cues.  Person educated: Patient Education method: Explanation Education comprehension: verbalized understanding  HOME EXERCISE PROGRAM: Access Code: [email or web address] URL: [email or web address] Date: 07/29/2022 Prepared by: Thresa Ross  Exercises - Standing Marching  - 1 x daily - 5 x weekly - 1 sets - 10 reps - 3 second hold - Standing Romberg to 3/4 Tandem Stance  - 1 x daily - 7 x weekly - 2 sets - 45 sec hold - Standing Single Leg Stance with Counter Support  - 1 x daily - 7 x weekly - 3 sets - 30 sec  hold - standing in split stance with vertical head nods   - 1 x daily - 7 x weekly - 3 sets - 10 reps - romberg stance with head turns laterally   - 1 x daily - 7 x weekly - 2 sets - 10 reps  GOALS: Goals reviewed with patient? Yes  SHORT TERM GOALS: Target date: 08/25/2022      Patient will be independent in home exercise program to improve strength/mobility for better functional independence with ADLs. Baseline: No HEP currently  Goal status: INITIAL     LONG TERM GOALS: Target date: 10/20/2022   1.  Patient (> 66 years old) will complete five times sit to stand test  in < 13 seconds indicating an increased LE strength and improved balance. Baseline: 15.77 sec Goal status: INITIAL  2.  Patient will increase FOTO score to equal to or greater than  68   to demonstrate statistically significant improvement in mobility and quality of life.  Baseline: 63 Goal status: INITIAL   3.  Patient will increase mini BEST Balance score by > 4 points to demonstrate decreased fall risk during functional activities. Baseline: 17 Goal status: INITIAL      ASSESSMENT:  CLINICAL IMPRESSION:  Continued with current plan of care as laid out in evaluation and recent prior sessions. Pt put forth good effort through treatment to address strength and balance deficits. PT instructed patient in dynamic balance training with emphasis on improved use of hip strategy to prevent to lateral loss of balance.  Patient continues to state that he will "never have the balance he had in his 17s".  Education provided from PT on benefits of reduce fall risk, and increase safety with community access from improved use of dynamic balance strategies.  Pt would continue to benefit from skilled PT to improve balance, safety, reduce fall risk, improve QoL.   OBJECTIVE  IMPAIRMENTS: Abnormal gait and decreased balance.   ACTIVITY LIMITATIONS: standing, squatting, and stairs  PARTICIPATION LIMITATIONS: community activity and yard work  PERSONAL FACTORS: Age, Time since onset of injury/illness/exacerbation, and 3+ comorbidities: THA, CKD, HLD, Atherosclerosis  are also affecting patient's functional outcome.   REHAB POTENTIAL: Excellent  CLINICAL DECISION MAKING: Stable/uncomplicated  EVALUATION COMPLEXITY: Low  PLAN:  PT FREQUENCY: 2x/week  PT DURATION: 12 weeks  PLANNED INTERVENTIONS: Therapeutic exercises, Therapeutic activity, Neuromuscular re-education, Balance training, Gait training, Patient/Family education, Self Care, Joint mobilization, Manual therapy, and Re-evaluation  PLAN  FOR NEXT SESSION:   high level static balance activities and progression to dynamic as appropriate.   resisted gait,  BLE hip strengthening.     Grier Rocher PT, DPT  Physical Therapist - [place]  [facility]  11:49 AM 08/24/22   Note: Portions of this document were prepared using Dragon voice recognition software and although reviewed may contain unintentional dictation errors in syntax, grammar, or spelling.

## 2022-08-26 ENCOUNTER — Ambulatory Visit: Payer: Medicare HMO

## 2022-08-26 DIAGNOSIS — R269 Unspecified abnormalities of gait and mobility: Secondary | ICD-10-CM | POA: Diagnosis not present

## 2022-08-26 DIAGNOSIS — R262 Difficulty in walking, not elsewhere classified: Secondary | ICD-10-CM | POA: Diagnosis not present

## 2022-08-26 DIAGNOSIS — M6281 Muscle weakness (generalized): Secondary | ICD-10-CM | POA: Diagnosis not present

## 2022-08-26 DIAGNOSIS — R2689 Other abnormalities of gait and mobility: Secondary | ICD-10-CM | POA: Diagnosis not present

## 2022-08-26 DIAGNOSIS — R2681 Unsteadiness on feet: Secondary | ICD-10-CM | POA: Diagnosis not present

## 2022-08-26 NOTE — Therapy (Signed)
OUTPATIENT PHYSICAL THERAPY NEURO treatment   Patient Name: Garrett Moore MRN: [id number] DOB:[birth year], 82 y.o., male Today's Date: 08/26/2022   PCP: Daisy Floro, MD  REFERRING PROVIDER: Daisy Floro, MD   END OF SESSION:  PT End of Session - 08/26/22 1058     Visit Number 9    Number of Visits 16    Date for PT Re-Evaluation 09/22/22    Progress Note Due on Visit 10    PT Start Time 1100    PT Stop Time 1142    PT Time Calculation (min) 42 min    Equipment Utilized During Treatment Gait belt    Activity Tolerance Patient tolerated treatment well    Behavior During Therapy [facility] for tasks assessed/performed              Past Medical History:  Diagnosis Date   Aneurysm of infrarenal abdominal aorta (HCC) 02/08/2018   a.) TTE 02/08/2018: Ao root 38 mm, asc Ao 40 mm; b.) TTE 06/14/2020; asc Ao 28 mm; c.) CT hematuria 05/10/2020: infrarenal AAA measuring 5.9 cm; d.) s/p EVAR 07/03/2020; e.) CT A/P 10/14/2021: residual aneurysmal sac (s/p EVAR) measured 6.0 cm   Aortic atherosclerosis (HCC)    Aortic valvar stenosis 07/21/2012   a.) TTE 07/21/2012: EF 50-55%, mild AS (AVA = 1.67, MPG 9); b.) TTE 02/08/2018: EF 60-65%; mod AS (MPG 17); c.) TTE 06/14/2020: EF 60-65%, mod AS (MPG 26.8)   Atherosclerosis of native arteries of extremity with intermittent claudication (HCC)    Basal cell carcinoma, face    BPH (benign prostatic hyperplasia)    Carotid artery stenosis 05/21/2006   a.) carotid US 05/21/2006: 0-49% BICA; b.) carotid US 06/10/2020: 1-39% RICA, total occ LICA   CKD (chronic kidney disease), stage III (HCC)    COPD (chronic obstructive pulmonary disease) (HCC)    Coronary artery disease 08/14/1983   a.) MI --> LHC 08/13/2016: CTO of the RCA --> PTCA performed resulting in 30% residual stenosis; aberrant takeoff of his RCA coming off somewhat high and posteriorly.   Diastolic dysfunction 02/08/2018   a.) TTE 02/08/2018: EF 60-65%, mild MAC,  triv TR,  mild-mod AS, G1DD; b.) TTE 06/14/2020: EF 60-65%, mod AS, triv TR, mild AR, G1DD   Diverticulosis    Family history of adverse reaction to anesthesia    a.) extended GA course for mother --> postoperaitve memory problems/dementia   GERD (gastroesophageal reflux disease)    Glaucoma    Heart murmur    History of kidney stones    Hyperlipidemia    Iliac artery aneurysm, bilateral (HCC) 07/03/2020   a.) s/p insertion of BILATERAL iliac artery stents   Myocardial infarction (HCC) 08/13/2016   a.) MI --> LHC 08/13/2016: CTO of the RCA --> PTCA performed resulting in 30% residual stenosis. Procedure complicated by procedural nausea, Patient went into IVR and then AV disaccociated rhythm. SBP dropped (80s). TVP floated with capture, which stabilized patient. ICU admission overnight.   OA (osteoarthritis)    Peripheral vascular disease (HCC)    Postoperative deep vein thrombosis (DVT) (HCC)    a.) postop age 16 following tonsillectomy (per patient report)   Past Surgical History:  Procedure Laterality Date   CATARACT EXTRACTION, BILATERAL  01/2019   CORONARY ANGIOPLASTY  08/14/1983   Procedure: CORONARY ANGIOPLASTY (PTCA pRCA); Location: [facility]; Surgeon: Al Little, MD   CYSTOSCOPY W/ RETROGRADES  08/12/2020   Procedure: CYSTOSCOPY WITH RETROGRADE PYELOGRAM;  Surgeon: Vanna Scotland, MD;  Location: [facility];  Service: Urology;;   CYSTOSCOPY WITH INSERTION OF UROLIFT N/A 12/22/2021   Procedure: CYSTOSCOPY WITH INSERTION OF UROLIFT;  Surgeon: Vanna Scotland, MD;  Location: [facility];  Service: Urology;  Laterality: N/A;   CYSTOSCOPY/URETEROSCOPY/HOLMIUM LASER/STENT PLACEMENT Left 08/12/2020   Procedure: CYSTOSCOPY/URETEROSCOPY/HOLMIUM LASER/STENT PLACEMENT;  Surgeon: Vanna Scotland, MD;  Location: [facility];  Service: Urology;  Laterality: Left;   ENDOVASCULAR REPAIR/STENT GRAFT N/A 07/03/2020   Procedure: ENDOVASCULAR REPAIR/STENT GRAFT;  Surgeon: Renford Dills, MD;  Location: [facility]  INVASIVE CV LAB;  Service: Cardiovascular;  Laterality: N/A;   PELVIC ANGIOGRAPHY Right 10/14/2021   Procedure: PELVIC ANGIOGRAPHY;  Surgeon: Renford Dills, MD;  Location: [facility] INVASIVE CV LAB;  Service: Cardiovascular;  Laterality: Right;   PERIPHERAL VASCULAR THROMBECTOMY Right 10/14/2021   Procedure: PERIPHERAL VASCULAR THROMBECTOMY;  Surgeon: Renford Dills, MD;  Location: [facility] INVASIVE CV LAB;  Service: Cardiovascular;  Laterality: Right;   ROTATOR CUFF REPAIR Left    TEMPORARY PACEMAKER  08/14/1983   Procedure: TEMPORARY PACEMAKER PLACEMENT; Location: [facility]; Surgeon: Al Little, MD   TONSILLECTOMY     TOTAL HIP ARTHROPLASTY Left 02/21/2019   Procedure: TOTAL HIP ARTHROPLASTY ANTERIOR APPROACH;  Surgeon: Durene Romans, MD;  Location: [facility];  Service: Orthopedics;  Laterality: Left;  70 mins   VITRECTOMY Bilateral    Patient Active Problem List   Diagnosis Date Noted   Moderate aortic stenosis 05/20/2022   Chronic diastolic CHF (congestive heart failure) (HCC) 05/20/2022   Paroxysmal atrial fibrillation (HCC) 05/20/2022   Vision loss of left eye 05/20/2022   Acute ischemic stroke (HCC) 05/19/2022   Atherosclerosis of native arteries of extremity with intermittent claudication (HCC) 09/28/2021   Aortic valve disease 08/05/2020   Atherosclerotic heart disease of native coronary artery without angina pectoris 08/05/2020   Basal cell carcinoma of skin 08/05/2020   Benign prostatic hyperplasia 08/05/2020   Stage 3a chronic kidney disease (CKD) (HCC) 08/05/2020   COPD (chronic obstructive pulmonary disease) (HCC) 08/05/2020   Frequent fecal incontinence 08/05/2020   Glaucoma 08/05/2020   Hyperlipidemia 08/05/2020   Other long term (current) drug therapy 08/05/2020   AAA (abdominal aortic aneurysm) (HCC) 07/03/2020   AAA (abdominal aortic aneurysm) without rupture (HCC) 05/26/2020   S/P left THA, AA 02/21/2019   Status post total hip replacement, left 02/21/2019     ONSET DATE: 08/21/21  REFERRING DIAG: R26.89 (ICD-10-CM) - Balance problem   THERAPY DIAG:  Unsteadiness on feet  Rationale for Evaluation and Treatment: Rehabilitation  SUBJECTIVE:  SUBJECTIVE STATEMENT:  Pt reports he "weebles and wobbles" but "I don't fall down." Reports issues mostly when he is standing up. Pt reports no aches/pains and no medication changes.  Pt accompanied by: self  PERTINENT HISTORY: Pt reports he noticed he has trouble when he first gets up and he tends to stumble, he recalls no dizziness when he gets up or lightheadedness. He recalls no falls in the last 6 months but has had many stumbles where he has caught himself. Pt went to ER following 1 minute of visual changes where he could not see out of his left eye, no changes in his visual field when he was just looking out of his right eye. No significant findings of stroke per ER report and notes and per pt report. Eye doctor also does not know why the acute visual change occurred.  Pt main goal for PT is to improve his balance. Pt reports he favors his left leg per report of observers.   PAIN:  Are you having pain? No  PRECAUTIONS: None  WEIGHT BEARING RESTRICTIONS: No  FALLS: Has patient fallen in last 6 months? No  LIVING ENVIRONMENT: Lives with: lives with their family and lives with their spouse Lives in: House/apartment Stairs: No Has following equipment at home: Single point cane and only uses cane if his legs are stiff on a given day.   PLOF: Independent  PATIENT GOALS: Improve his balance  OBJECTIVE: (objective measures completed at initial evaluation unless otherwise dated)   DIAGNOSTIC FINDINGS: N/A, no acute findings for CVA or other abnormalities at ER in February   COGNITION: Overall cognitive  status: Within functional limits for tasks assessed   SENSATION: Not tested  COORDINATION: WNL   POSTURE: No Significant postural limitations   LOWER EXTREMITY MMT:    MMT Right Eval Left Eval  Hip flexion 5 5  Hip extension    Hip abduction 5 5  Hip adduction 5 5  Hip internal rotation 5 5  Hip external rotation 5 5  Knee flexion 5 5  Knee extension 4+ 5  Ankle dorsiflexion 5 5  Ankle plantarflexion 4+ 4+  Ankle inversion    Ankle eversion    (Blank rows = not tested) All tested in seated position   STAIRS: Level of Assistance: Complete Independence Stair Negotiation Technique: Alternating Pattern  with No Rails Number of Stairs: 4  Height of Stairs: 6 in  Comments: some hesitancy with descending steps   GAIT: Gait pattern:  With quick turnaround and pivot turn patient demonstrates poor single-leg stance stability and also utilizes several narrow base of support steps to recover balance from this acute loss of balance.  and step through pattern Distance walked: 30 ft Assistive device utilized: None Level of assistance: Complete Independence Comments: Difficulty with more dynamic balance tasks with simple ambulation no significant errors or limitations.  FUNCTIONAL TESTS:  5 times sit to stand: 15.77 sec  MiniBEST      PATIENT SURVEYS:  FOTO 63, risk adjusted goal of 64 ( will set goal higher to ensure legitimate change but will be lofty goal due to high initial score)   TODAY'S TREATMENT:  DATE: 08/26/22  NMR: Airex pad: WBOS 30 sec NBOS 30 sec NBOS EC 2x30 sec   In // bars: Airex beam tandem stance 2x30 sec each LE Tandem gait x 8x length of beam - intermittent UE support  Cone taps (FWD same side) 2 cones 1x20 Cone taps (crossover step) 2 cones 1x20 Comments: decreased eccentric control and intermittent UE support  SLB  progression soccer ball circles CW/CC x multiple reps of each   One foot on airex pad and foot on 6" step 2x30 sec each LE, then repeated with dual cog  task 1x30 sec each LE, then with EC 2x30 sec each LE. Challenging  Airex beam side stepping x multiple reps each way Airex beam cross-over stepping with UUE to 3 finger support on the bar 6x  Agility ladder  1 foot in each space x 8x --then completed with dual cog task 4x --then completed without dual cog task but with high knee march 8x Comments: intermittent UE support throughout   PATIENT EDUCATION: Education details: POC; Pt educated throughout session about proper posture and technique with exercises. Improved exercise technique, movement at target joints, use of target muscles after min to mod verbal, visual, tactile cues.  Person educated: Patient Education method: Explanation Education comprehension: verbalized understanding  HOME EXERCISE PROGRAM: Access Code: [email or web address] URL: [email or web address] Date: 07/29/2022 Prepared by: Thresa Ross  Exercises - Standing Marching  - 1 x daily - 5 x weekly - 1 sets - 10 reps - 3 second hold - Standing Romberg to 3/4 Tandem Stance  - 1 x daily - 7 x weekly - 2 sets - 45 sec hold - Standing Single Leg Stance with Counter Support  - 1 x daily - 7 x weekly - 3 sets - 30 sec  hold - standing in split stance with vertical head nods   - 1 x daily - 7 x weekly - 3 sets - 10 reps - romberg stance with head turns laterally   - 1 x daily - 7 x weekly - 2 sets - 10 reps  GOALS: Goals reviewed with patient? Yes  SHORT TERM GOALS: Target date: 08/25/2022      Patient will be independent in home exercise program to improve strength/mobility for better functional independence with ADLs. Baseline: No HEP currently  Goal status: INITIAL     LONG TERM GOALS: Target date: 10/20/2022   1.  Patient (> 43 years old) will complete five times sit to stand test in < 13 seconds  indicating an increased LE strength and improved balance. Baseline: 15.77 sec Goal status: INITIAL  2.  Patient will increase FOTO score to equal to or greater than  68   to demonstrate statistically significant improvement in mobility and quality of life.  Baseline: 63 Goal status: INITIAL   3.  Patient will increase mini BEST Balance score by > 4 points to demonstrate decreased fall risk during functional activities. Baseline: 17 Goal status: INITIAL      ASSESSMENT:  CLINICAL IMPRESSION: Pt with excellent motivation to participate in session. PT continued plan as laid out in eval and recent sessions. Pt required intermittent UE support with multiple interventions generally had difficulty with SLB-related activities. The pt would continue to benefit from skilled PT to improve balance, safety, reduce fall risk, improve QoL.   OBJECTIVE IMPAIRMENTS: Abnormal gait and decreased balance.   ACTIVITY LIMITATIONS: standing, squatting, and stairs  PARTICIPATION LIMITATIONS: community activity and yard work  PERSONAL FACTORS: Age, Time since onset of  injury/illness/exacerbation, and 3+ comorbidities: THA, CKD, HLD, Atherosclerosis  are also affecting patient's functional outcome.   REHAB POTENTIAL: Excellent  CLINICAL DECISION MAKING: Stable/uncomplicated  EVALUATION COMPLEXITY: Low  PLAN:  PT FREQUENCY: 2x/week  PT DURATION: 12 weeks  PLANNED INTERVENTIONS: Therapeutic exercises, Therapeutic activity, Neuromuscular re-education, Balance training, Gait training, Patient/Family education, Self Care, Joint mobilization, Manual therapy, and Re-evaluation  PLAN FOR NEXT SESSION:   high level static balance activities and progression to dynamic as appropriate.   resisted gait,  BLE hip strengthening. Continue plan    Temple Pacini PT, DPT  Physical Therapist - [facility]  [facility]  5:46 PM 08/26/22

## 2022-09-02 ENCOUNTER — Ambulatory Visit: Payer: Medicare HMO

## 2022-09-02 DIAGNOSIS — R269 Unspecified abnormalities of gait and mobility: Secondary | ICD-10-CM | POA: Diagnosis not present

## 2022-09-02 DIAGNOSIS — M6281 Muscle weakness (generalized): Secondary | ICD-10-CM | POA: Diagnosis not present

## 2022-09-02 DIAGNOSIS — R262 Difficulty in walking, not elsewhere classified: Secondary | ICD-10-CM | POA: Diagnosis not present

## 2022-09-02 DIAGNOSIS — R2689 Other abnormalities of gait and mobility: Secondary | ICD-10-CM | POA: Diagnosis not present

## 2022-09-02 DIAGNOSIS — R2681 Unsteadiness on feet: Secondary | ICD-10-CM

## 2022-09-02 NOTE — Therapy (Signed)
OUTPATIENT PHYSICAL THERAPY NEURO treatment/Physical Therapy Progress Note   Dates of reporting period  07/28/2022   to   09/02/2022    Patient Name: Garrett Moore MRN: [id number] DOB:07/01/1940, 82 y.o., male Today's Date: 09/02/2022   PCP: Daisy Floro, MD  REFERRING PROVIDER: Daisy Floro, MD   END OF SESSION:  PT End of Session - 09/02/22 1151     Visit Number 10    Number of Visits 16    Date for PT Re-Evaluation 09/22/22    Progress Note Due on Visit 10    PT Start Time 1101    PT Stop Time 1144    PT Time Calculation (min) 43 min    Equipment Utilized During Treatment Gait belt    Activity Tolerance Patient tolerated treatment well    Behavior During Therapy [facility] for tasks assessed/performed               Past Medical History:  Diagnosis Date   Aneurysm of infrarenal abdominal aorta (HCC) 02/08/2018   a.) TTE 02/08/2018: Ao root 38 mm, asc Ao 40 mm; b.) TTE 06/14/2020; asc Ao 28 mm; c.) CT hematuria 05/10/2020: infrarenal AAA measuring 5.9 cm; d.) s/p EVAR 07/03/2020; e.) CT A/P 10/14/2021: residual aneurysmal sac (s/p EVAR) measured 6.0 cm   Aortic atherosclerosis (HCC)    Aortic valvar stenosis 07/21/2012   a.) TTE 07/21/2012: EF 50-55%, mild AS (AVA = 1.67, MPG 9); b.) TTE 02/08/2018: EF 60-65%; mod AS (MPG 17); c.) TTE 06/14/2020: EF 60-65%, mod AS (MPG 26.8)   Atherosclerosis of native arteries of extremity with intermittent claudication (HCC)    Basal cell carcinoma, face    BPH (benign prostatic hyperplasia)    Carotid artery stenosis 05/21/2006   a.) carotid US 05/21/2006: 0-49% BICA; b.) carotid US 06/10/2020: 1-39% RICA, total occ LICA   CKD (chronic kidney disease), stage III (HCC)    COPD (chronic obstructive pulmonary disease) (HCC)    Coronary artery disease 08/14/1983   a.) MI --> LHC 08/13/2016: CTO of the RCA --> PTCA performed resulting in 30% residual stenosis; aberrant takeoff of his RCA coming off somewhat high and  posteriorly.   Diastolic dysfunction 02/08/2018   a.) TTE 02/08/2018: EF 60-65%, mild MAC,  triv TR, mild-mod AS, G1DD; b.) TTE 06/14/2020: EF 60-65%, mod AS, triv TR, mild AR, G1DD   Diverticulosis    Family history of adverse reaction to anesthesia    a.) extended GA course for mother --> postoperaitve memory problems/dementia   GERD (gastroesophageal reflux disease)    Glaucoma    Heart murmur    History of kidney stones    Hyperlipidemia    Iliac artery aneurysm, bilateral (HCC) 07/03/2020   a.) s/p insertion of BILATERAL iliac artery stents   Myocardial infarction (HCC) 08/13/2016   a.) MI --> LHC 08/13/2016: CTO of the RCA --> PTCA performed resulting in 30% residual stenosis. Procedure complicated by procedural nausea, Patient went into IVR and then AV disaccociated rhythm. SBP dropped (80s). TVP floated with capture, which stabilized patient. ICU admission overnight.   OA (osteoarthritis)    Peripheral vascular disease (HCC)    Postoperative deep vein thrombosis (DVT) (HCC)    a.) postop age 9 following tonsillectomy (per patient report)   Past Surgical History:  Procedure Laterality Date   CATARACT EXTRACTION, BILATERAL  01/2019   CORONARY ANGIOPLASTY  08/14/1983   Procedure: CORONARY ANGIOPLASTY (PTCA pRCA); Location: [facility]; Surgeon: Al Little, MD   CYSTOSCOPY W/ RETROGRADES  08/12/2020   Procedure: CYSTOSCOPY WITH RETROGRADE PYELOGRAM;  Surgeon: Vanna Scotland, MD;  Location: [facility];  Service: Urology;;   CYSTOSCOPY WITH INSERTION OF UROLIFT N/A 12/22/2021   Procedure: CYSTOSCOPY WITH INSERTION OF UROLIFT;  Surgeon: Vanna Scotland, MD;  Location: [facility];  Service: Urology;  Laterality: N/A;   CYSTOSCOPY/URETEROSCOPY/HOLMIUM LASER/STENT PLACEMENT Left 08/12/2020   Procedure: CYSTOSCOPY/URETEROSCOPY/HOLMIUM LASER/STENT PLACEMENT;  Surgeon: Vanna Scotland, MD;  Location: [facility];  Service: Urology;  Laterality: Left;   ENDOVASCULAR REPAIR/STENT GRAFT N/A 07/03/2020    Procedure: ENDOVASCULAR REPAIR/STENT GRAFT;  Surgeon: Renford Dills, MD;  Location: [facility];  Service: Cardiovascular;  Laterality: N/A;   PELVIC ANGIOGRAPHY Right 10/14/2021   Procedure: PELVIC ANGIOGRAPHY;  Surgeon: Renford Dills, MD;  Location: [facility];  Service: Cardiovascular;  Laterality: Right;   PERIPHERAL VASCULAR THROMBECTOMY Right 10/14/2021   Procedure: PERIPHERAL VASCULAR THROMBECTOMY;  Surgeon: Renford Dills, MD;  Location: [facility];  Service: Cardiovascular;  Laterality: Right;   ROTATOR CUFF REPAIR Left    TEMPORARY PACEMAKER  08/14/1983   Procedure: TEMPORARY PACEMAKER PLACEMENT; Location: [facility]; Surgeon: Al Little, MD   TONSILLECTOMY     TOTAL HIP ARTHROPLASTY Left 02/21/2019   Procedure: TOTAL HIP ARTHROPLASTY ANTERIOR APPROACH;  Surgeon: Durene Romans, MD;  Location: [facility];  Service: Orthopedics;  Laterality: Left;  70 mins   VITRECTOMY Bilateral    Patient Active Problem List   Diagnosis Date Noted   Moderate aortic stenosis 05/20/2022   Chronic diastolic CHF (congestive heart failure) (HCC) 05/20/2022   Paroxysmal atrial fibrillation (HCC) 05/20/2022   Vision loss of left eye 05/20/2022   Acute ischemic stroke (HCC) 05/19/2022   Atherosclerosis of native arteries of extremity with intermittent claudication (HCC) 09/28/2021   Aortic valve disease 08/05/2020   Atherosclerotic heart disease of native coronary artery without angina pectoris 08/05/2020   Basal cell carcinoma of skin 08/05/2020   Benign prostatic hyperplasia 08/05/2020   Stage 3a chronic kidney disease (CKD) (HCC) 08/05/2020   COPD (chronic obstructive pulmonary disease) (HCC) 08/05/2020   Frequent fecal incontinence 08/05/2020   Glaucoma 08/05/2020   Hyperlipidemia 08/05/2020   Other long term (current) drug therapy 08/05/2020   AAA (abdominal aortic aneurysm) (HCC) 07/03/2020   AAA (abdominal aortic aneurysm) without rupture (HCC)  05/26/2020   S/P left THA, AA 02/21/2019   Status post total hip replacement, left 02/21/2019    ONSET DATE: 08/21/21  REFERRING DIAG: R26.89 (ICD-10-CM) - Balance problem   THERAPY DIAG:  Unsteadiness on feet  Difficulty in walking, not elsewhere classified  Rationale for Evaluation and Treatment: Rehabilitation  SUBJECTIVE:  SUBJECTIVE STATEMENT:  Pt reports some R LE pain. Pt reports remaining issues are when he first gets up in the morning or has been sitting for a while.   Pt accompanied by: self  PERTINENT HISTORY: Pt reports he noticed he has trouble when he first gets up and he tends to stumble, he recalls no dizziness when he gets up or lightheadedness. He recalls no falls in the last 6 months but has had many stumbles where he has caught himself. Pt went to ER following 1 minute of visual changes where he could not see out of his left eye, no changes in his visual field when he was just looking out of his right eye. No significant findings of stroke per ER report and notes and per pt report. Eye doctor also does not know why the acute visual change occurred.  Pt main goal for PT is to improve his balance. Pt reports he favors his left leg per report of observers.   PAIN:  Are you having pain? No  PRECAUTIONS: None  WEIGHT BEARING RESTRICTIONS: No  FALLS: Has patient fallen in last 6 months? No  LIVING ENVIRONMENT: Lives with: lives with their family and lives with their spouse Lives in: House/apartment Stairs: No Has following equipment at home: Single point cane and only uses cane if his legs are stiff on a given day.   PLOF: Independent  PATIENT GOALS: Improve his balance  OBJECTIVE: (objective measures completed at initial evaluation unless otherwise dated)   DIAGNOSTIC  FINDINGS: N/A, no acute findings for CVA or other abnormalities at ER in February   COGNITION: Overall cognitive status: Within functional limits for tasks assessed   SENSATION: Not tested  COORDINATION: WNL   POSTURE: No Significant postural limitations   LOWER EXTREMITY MMT:    MMT Right Eval Left Eval  Hip flexion 5 5  Hip extension    Hip abduction 5 5  Hip adduction 5 5  Hip internal rotation 5 5  Hip external rotation 5 5  Knee flexion 5 5  Knee extension 4+ 5  Ankle dorsiflexion 5 5  Ankle plantarflexion 4+ 4+  Ankle inversion    Ankle eversion    (Blank rows = not tested) All tested in seated position   STAIRS: Level of Assistance: Complete Independence Stair Negotiation Technique: Alternating Pattern  with No Rails Number of Stairs: 4  Height of Stairs: 6 in  Comments: some hesitancy with descending steps   GAIT: Gait pattern:  With quick turnaround and pivot turn patient demonstrates poor single-leg stance stability and also utilizes several narrow base of support steps to recover balance from this acute loss of balance.  and step through pattern Distance walked: 30 ft Assistive device utilized: None Level of assistance: Complete Independence Comments: Difficulty with more dynamic balance tasks with simple ambulation no significant errors or limitations.  FUNCTIONAL TESTS:  5 times sit to stand: 15.77 sec  MiniBEST   [facility] PT Assessment - 09/02/22 0001       Mini-BESTest   Sit To Stand Normal: Comes to stand without use of hands and stabilizes independently.    Rise to Toes Moderate: Heels up, but not full range (smaller than when holding hands), OR noticeable instability for 3 s.    Stand on one leg (left) Moderate: < 20 s    Stand on one leg (right) Moderate: < 20 s    Stand on one leg - lowest score 1    Compensatory Stepping Correction -  Forward Moderate: More than one step is required to recover equilibrium    Compensatory Stepping  Correction - Backward Normal: Recovers independently with a single, large step    Compensatory Stepping Correction - Left Lateral Moderate: Several steps to recover equilibrium    Compensatory Stepping Correction - Right Lateral Moderate: Several steps to recover equilibrium    Stepping Corredtion Lateral - lowest score 1    Stance - Feet together, eyes open, firm surface  Normal: 30s    Stance - Feet together, eyes closed, foam surface  Normal: 30s    Incline - Eyes Closed Moderate: Stands independently < 30s OR aligns with surface    Change in Gait Speed Normal: Significantly changes walkling speed without imbalance    Walk with head turns - Horizontal Normal: performs head turns with no change in gait speed and good balance    Walk with pivot turns Normal: Turns with feet close FAST (< 3 steps) with good balance.    Step over obstacles Normal: Able to step over box with minimal change of gait speed and with good balance.    Timed UP & GO with Dual Task Moderate: Dual Task affects either counting OR walking (>10%) when compared to the TUG without Dual Task.    Mini-BEST total score 22               PATIENT SURVEYS:  FOTO 63, risk adjusted goal of 64 ( will set goal higher to ensure legitimate change but will be lofty goal due to high initial score)   TODAY'S TREATMENT:                                                                                                                              DATE: 09/02/22  TA: Goal testing completed on this date. PT instructs pt in indications of performance and plan throughout. Please refer to goal section below for details.  BP assessment- Seated: 122/66 mmHg HR 69 bpm Standing: 140/62 mmHg HR 78 bpm No symptoms  NMR: One foot on floor, one on 6" step 2x30 sec for each LE position Alt LE march 2x20 cuing for slow speed, to promote SLB - no UE support   PATIENT EDUCATION: Education details: POC; Pt educated throughout session about  proper posture and technique with exercises. Improved exercise technique, movement at target joints, use of target muscles after min to mod verbal, visual, tactile cues.  Person educated: Patient Education method: Explanation Education comprehension: verbalized understanding  HOME EXERCISE PROGRAM: Access Code: [email or web address] URL: [email or web address] Date: 07/29/2022 Prepared by: Thresa Ross  Exercises - Standing Marching  - 1 x daily - 5 x weekly - 1 sets - 10 reps - 3 second hold - Standing Romberg to 3/4 Tandem Stance  - 1 x daily - 7 x weekly - 2 sets - 45 sec hold - Standing Single Leg Stance with Counter Support  - 1 x daily - 7 x weekly -  3 sets - 30 sec  hold - standing in split stance with vertical head nods   - 1 x daily - 7 x weekly - 3 sets - 10 reps - romberg stance with head turns laterally   - 1 x daily - 7 x weekly - 2 sets - 10 reps  GOALS: Goals reviewed with patient? Yes  SHORT TERM GOALS: Target date: 08/25/2022      Patient will be independent in home exercise program to improve strength/mobility for better functional independence with ADLs. Baseline: No HEP currently 6/12: Completing HEP 1-2x/day, pt is independent Goal status: MET     LONG TERM GOALS: Target date: 10/20/2022   1.  Patient (> 78 years old) will complete five times sit to stand test in < 13 seconds indicating an increased LE strength and improved balance. Baseline: 15.77 sec; 09/02/22: 10 seconds  Goal status: MET  2.  Patient will increase FOTO score to equal to or greater than  68   to demonstrate statistically significant improvement in mobility and quality of life.  Baseline: 63; 09/02/22 63 Goal status: ONGOING   3.  Patient will increase mini BEST Balance score by > 4 points to demonstrate decreased fall risk during functional activities. Baseline: 17; 09/02/2022: 22  Goal status: MET  4.  Patient will improve SLB to at least 5 seconds each LE in order to decrease  fall risk with gait and obstacle negotiation. Baseline: 09/02/2022: <3 sec each LE Goal status: NEW      ASSESSMENT:  CLINICAL IMPRESSION: Goal reassessment completed on this date. Pt making gains AEB improving mini Best score and meeting 5xSTS goal, both indicate improvements in balance and 5xSTS performance indicates decreased fall risk. New goal to address SLB deficit added. While pt making gains, FOTO score the same today, indicating pt without change in perception of mobility and QOL.The pt would continue to benefit from skilled PT to improve balance, safety, reduce fall risk, improve QoL.   OBJECTIVE IMPAIRMENTS: Abnormal gait and decreased balance.   ACTIVITY LIMITATIONS: standing, squatting, and stairs  PARTICIPATION LIMITATIONS: community activity and yard work  PERSONAL FACTORS: Age, Time since onset of injury/illness/exacerbation, and 3+ comorbidities: THA, CKD, HLD, Atherosclerosis  are also affecting patient's functional outcome.   REHAB POTENTIAL: Excellent  CLINICAL DECISION MAKING: Stable/uncomplicated  EVALUATION COMPLEXITY: Low  PLAN:  PT FREQUENCY: 2x/week  PT DURATION: 12 weeks  PLANNED INTERVENTIONS: Therapeutic exercises, Therapeutic activity, Neuromuscular re-education, Balance training, Gait training, Patient/Family education, Self Care, Joint mobilization, Manual therapy, and Re-evaluation  PLAN FOR NEXT SESSION:   high level static balance activities and progression to dynamic as appropriate.   resisted gait,  BLE hip strengthening. Continue plan    Temple Pacini PT, DPT  Physical Therapist - [facility]  11:57 AM 09/02/22

## 2022-09-07 ENCOUNTER — Ambulatory Visit: Payer: Medicare HMO | Admitting: Physical Therapy

## 2022-09-16 ENCOUNTER — Encounter: Payer: Self-pay | Admitting: Physical Therapy

## 2022-09-16 ENCOUNTER — Ambulatory Visit: Payer: Medicare HMO | Admitting: Physical Therapy

## 2022-09-16 DIAGNOSIS — R262 Difficulty in walking, not elsewhere classified: Secondary | ICD-10-CM | POA: Diagnosis not present

## 2022-09-16 DIAGNOSIS — R269 Unspecified abnormalities of gait and mobility: Secondary | ICD-10-CM

## 2022-09-16 DIAGNOSIS — R2681 Unsteadiness on feet: Secondary | ICD-10-CM

## 2022-09-16 DIAGNOSIS — R2689 Other abnormalities of gait and mobility: Secondary | ICD-10-CM

## 2022-09-16 DIAGNOSIS — M6281 Muscle weakness (generalized): Secondary | ICD-10-CM | POA: Diagnosis not present

## 2022-09-16 NOTE — Therapy (Signed)
OUTPATIENT PHYSICAL THERAPY NEURO TREATMENT NOTE    Patient Name: Garrett Moore MRN: [id number] DOB:08-18-1940, 82 y.o., male Today's Date: 09/16/2022   PCP: Daisy Floro, MD  REFERRING PROVIDER: Daisy Floro, MD   END OF SESSION:  PT End of Session - 09/16/22 1058     Visit Number 11    Number of Visits 16    Date for PT Re-Evaluation 09/22/22    Progress Note Due on Visit 10    PT Start Time 1102    PT Stop Time 1143    PT Time Calculation (min) 41 min    Equipment Utilized During Treatment Gait belt    Activity Tolerance Patient tolerated treatment well    Behavior During Therapy [facility] for tasks assessed/performed                Past Medical History:  Diagnosis Date   Aneurysm of infrarenal abdominal aorta (HCC) 02/08/2018   a.) TTE 02/08/2018: Ao root 38 mm, asc Ao 40 mm; b.) TTE 06/14/2020; asc Ao 28 mm; c.) CT hematuria 05/10/2020: infrarenal AAA measuring 5.9 cm; d.) s/p EVAR 07/03/2020; e.) CT A/P 10/14/2021: residual aneurysmal sac (s/p EVAR) measured 6.0 cm   Aortic atherosclerosis (HCC)    Aortic valvar stenosis 07/21/2012   a.) TTE 07/21/2012: EF 50-55%, mild AS (AVA = 1.67, MPG 9); b.) TTE 02/08/2018: EF 60-65%; mod AS (MPG 17); c.) TTE 06/14/2020: EF 60-65%, mod AS (MPG 26.8)   Atherosclerosis of native arteries of extremity with intermittent claudication (HCC)    Basal cell carcinoma, face    BPH (benign prostatic hyperplasia)    Carotid artery stenosis 05/21/2006   a.) carotid US 05/21/2006: 0-49% BICA; b.) carotid US 06/10/2020: 1-39% RICA, total occ LICA   CKD (chronic kidney disease), stage III (HCC)    COPD (chronic obstructive pulmonary disease) (HCC)    Coronary artery disease 08/14/1983   a.) MI --> LHC 08/13/2016: CTO of the RCA --> PTCA performed resulting in 30% residual stenosis; aberrant takeoff of his RCA coming off somewhat high and posteriorly.   Diastolic dysfunction 02/08/2018   a.) TTE 02/08/2018: EF 60-65%, mild MAC,   triv TR, mild-mod AS, G1DD; b.) TTE 06/14/2020: EF 60-65%, mod AS, triv TR, mild AR, G1DD   Diverticulosis    Family history of adverse reaction to anesthesia    a.) extended GA course for mother --> postoperaitve memory problems/dementia   GERD (gastroesophageal reflux disease)    Glaucoma    Heart murmur    History of kidney stones    Hyperlipidemia    Iliac artery aneurysm, bilateral (HCC) 07/03/2020   a.) s/p insertion of BILATERAL iliac artery stents   Myocardial infarction (HCC) 08/13/2016   a.) MI --> LHC 08/13/2016: CTO of the RCA --> PTCA performed resulting in 30% residual stenosis. Procedure complicated by procedural nausea, Patient went into IVR and then AV disaccociated rhythm. SBP dropped (80s). TVP floated with capture, which stabilized patient. ICU admission overnight.   OA (osteoarthritis)    Peripheral vascular disease (HCC)    Postoperative deep vein thrombosis (DVT) (HCC)    a.) postop age 26 following tonsillectomy (per patient report)   Past Surgical History:  Procedure Laterality Date   CATARACT EXTRACTION, BILATERAL  01/2019   CORONARY ANGIOPLASTY  08/14/1983   Procedure: CORONARY ANGIOPLASTY (PTCA pRCA); Location: [facility]; Surgeon: Al Little, MD   CYSTOSCOPY W/ RETROGRADES  08/12/2020   Procedure: CYSTOSCOPY WITH RETROGRADE PYELOGRAM;  Surgeon: Vanna [place], MD;  Location: [facility];  Service: Urology;;   CYSTOSCOPY WITH INSERTION OF UROLIFT N/A 12/22/2021   Procedure: CYSTOSCOPY WITH INSERTION OF UROLIFT;  Surgeon: Vanna [place], MD;  Location: [facility];  Service: Urology;  Laterality: N/A;   CYSTOSCOPY/URETEROSCOPY/HOLMIUM LASER/STENT PLACEMENT Left 08/12/2020   Procedure: CYSTOSCOPY/URETEROSCOPY/HOLMIUM LASER/STENT PLACEMENT;  Surgeon: Vanna [place], MD;  Location: [facility];  Service: Urology;  Laterality: Left;   ENDOVASCULAR REPAIR/STENT GRAFT N/A 07/03/2020   Procedure: ENDOVASCULAR REPAIR/STENT GRAFT;  Surgeon: Renford Dills, MD;  Location:  [facility];  Service: Cardiovascular;  Laterality: N/A;   PELVIC ANGIOGRAPHY Right 10/14/2021   Procedure: PELVIC ANGIOGRAPHY;  Surgeon: Renford Dills, MD;  Location: [facility];  Service: Cardiovascular;  Laterality: Right;   PERIPHERAL VASCULAR THROMBECTOMY Right 10/14/2021   Procedure: PERIPHERAL VASCULAR THROMBECTOMY;  Surgeon: Renford Dills, MD;  Location: [facility];  Service: Cardiovascular;  Laterality: Right;   ROTATOR CUFF REPAIR Left    TEMPORARY PACEMAKER  08/14/1983   Procedure: TEMPORARY PACEMAKER PLACEMENT; Location: [facility]; Surgeon: Al Little, MD   TONSILLECTOMY     TOTAL HIP ARTHROPLASTY Left 02/21/2019   Procedure: TOTAL HIP ARTHROPLASTY ANTERIOR APPROACH;  Surgeon: Durene Romans, MD;  Location: [facility];  Service: Orthopedics;  Laterality: Left;  70 mins   VITRECTOMY Bilateral    Patient Active Problem List   Diagnosis Date Noted   Moderate aortic stenosis 05/20/2022   Chronic diastolic CHF (congestive heart failure) (HCC) 05/20/2022   Paroxysmal atrial fibrillation (HCC) 05/20/2022   Vision loss of left eye 05/20/2022   Acute ischemic stroke (HCC) 05/19/2022   Atherosclerosis of native arteries of extremity with intermittent claudication (HCC) 09/28/2021   Aortic valve disease 08/05/2020   Atherosclerotic heart disease of native coronary artery without angina pectoris 08/05/2020   Basal cell carcinoma of skin 08/05/2020   Benign prostatic hyperplasia 08/05/2020   Stage 3a chronic kidney disease (CKD) (HCC) 08/05/2020   COPD (chronic obstructive pulmonary disease) (HCC) 08/05/2020   Frequent fecal incontinence 08/05/2020   Glaucoma 08/05/2020   Hyperlipidemia 08/05/2020   Other long term (current) drug therapy 08/05/2020   AAA (abdominal aortic aneurysm) (HCC) 07/03/2020   AAA (abdominal aortic aneurysm) without rupture (HCC) 05/26/2020   S/P left THA, AA 02/21/2019   Status post total hip replacement, left 02/21/2019     ONSET DATE: 08/21/21  REFERRING DIAG: R26.89 (ICD-10-CM) - Balance problem   THERAPY DIAG:  Unsteadiness on feet  Difficulty in walking, not elsewhere classified  Abnormality of gait and mobility  Other abnormalities of gait and mobility  Muscle weakness (generalized)  Rationale for Evaluation and Treatment: Rehabilitation  SUBJECTIVE:  SUBJECTIVE STATEMENT: Pt reports doing well today, notes he doesn't feel that he's improving much anymore according to himself and thoughts on previous progress note session. Discussed with SPT and supervising PT about the possibility of 'finishing up' with therapy next session.   Pt accompanied by: self  PERTINENT HISTORY: Pt reports he noticed he has trouble when he first gets up and he tends to stumble, he recalls no dizziness when he gets up or lightheadedness. He recalls no falls in the last 6 months but has had many stumbles where he has caught himself. Pt went to ER following 1 minute of visual changes where he could not see out of his left eye, no changes in his visual field when he was just looking out of his right eye. No significant findings of stroke per ER report and notes and per pt report. Eye doctor also does not know why the acute visual change occurred.  Pt main goal for PT is to improve his balance. Pt reports he favors his left leg per report of observers.   PAIN:  Are you having pain? No  PRECAUTIONS: None  WEIGHT BEARING RESTRICTIONS: No  FALLS: Has patient fallen in last 6 months? No  LIVING ENVIRONMENT: Lives with: lives with their family and lives with their spouse Lives in: House/apartment Stairs: No Has following equipment at home: Single point cane and only uses cane if his legs are stiff on a given day.   PLOF:  Independent  PATIENT GOALS: Improve his balance  OBJECTIVE: (objective measures completed at initial evaluation unless otherwise dated)   DIAGNOSTIC FINDINGS: N/A, no acute findings for CVA or other abnormalities at ER in February   COGNITION: Overall cognitive status: Within functional limits for tasks assessed   SENSATION: Not tested  COORDINATION: WNL   POSTURE: No Significant postural limitations   LOWER EXTREMITY MMT:    MMT Right Eval Left Eval  Hip flexion 5 5  Hip extension    Hip abduction 5 5  Hip adduction 5 5  Hip internal rotation 5 5  Hip external rotation 5 5  Knee flexion 5 5  Knee extension 4+ 5  Ankle dorsiflexion 5 5  Ankle plantarflexion 4+ 4+  Ankle inversion    Ankle eversion    (Blank rows = not tested) All tested in seated position   STAIRS: Level of Assistance: Complete Independence Stair Negotiation Technique: Alternating Pattern  with No Rails Number of Stairs: 4  Height of Stairs: 6 in  Comments: some hesitancy with descending steps   GAIT: Gait pattern:  With quick turnaround and pivot turn patient demonstrates poor single-leg stance stability and also utilizes several narrow base of support steps to recover balance from this acute loss of balance.  and step through pattern Distance walked: 30 ft Assistive device utilized: None Level of assistance: Complete Independence Comments: Difficulty with more dynamic balance tasks with simple ambulation no significant errors or limitations.  FUNCTIONAL TESTS:  5 times sit to stand: 15.77 sec  MiniBEST       PATIENT SURVEYS:  FOTO 63, risk adjusted goal of 64 ( will set goal higher to ensure legitimate change but will be lofty goal due to high initial score)   TODAY'S TREATMENT:  DATE: 09/16/22  Throughout session, pt requiring Mod I to Min A assist level  to recover from various LOB episodes. Multiple instances throughout session Min a req. due to pt having slowed reaction time and poor awareness of LOB.  NMR: On Airex pad: WBOS 30 sec NBOS 30 sec NBOS horizontal head turns 30 sec Tandem stance 2 x 30 sec each foot  Blaze pod activity: ----Activity Description: 2 pods on 6 in step, on pod laterally on ipsilateral side. For first set, then for sets 2 and 3: added 1 pod posterio-laterally for increased challenge. Activity Setting: The Blaze Pod Random setting was chosen to enhance cognitive processing and agility, providing an unpredictable environment to simulate real-world scenarios, and fostering quick reactions and adaptability.  Number of Pods:  3 for one set, then 4 for 2 sets Cycles/Sets:  3 Duration (Time or Hit Count):  25 hits each side  ----Activity Description: Stepping to tap from airex pad onto 2 pods on 6 inch step. Activity Setting: The Blaze Pod Random setting was chosen to enhance cognitive processing and agility, providing an unpredictable environment to simulate real-world scenarios, and fostering quick reactions and adaptability.   Number of Pods:  2 Cycles/Sets:  2 Duration (Time or Hit Count):  20 hits  -Step up and retro step onto airex pad x 10 leading with L foot, then x 10 leading with R foot.  -Working on [employer], working on 2 x 30 sec durations with intermittent UE support, pt consistently having slow response to catching self.      PATIENT EDUCATION: Education details: POC; Pt educated throughout session about proper posture and technique with exercises. Improved exercise technique, movement at target joints, use of target muscles after min to mod verbal, visual, tactile cues.  Person educated: Patient Education method: Explanation Education comprehension: verbalized understanding  HOME EXERCISE PROGRAM: Access Code: [email or web address] URL: [email or web address] Date: 07/29/2022 Prepared  by: Thresa Ross  Exercises - Standing Marching  - 1 x daily - 5 x weekly - 1 sets - 10 reps - 3 second hold - Standing Romberg to 3/4 Tandem Stance  - 1 x daily - 7 x weekly - 2 sets - 45 sec hold - Standing Single Leg Stance with Counter Support  - 1 x daily - 7 x weekly - 3 sets - 30 sec  hold - standing in split stance with vertical head nods   - 1 x daily - 7 x weekly - 3 sets - 10 reps - romberg stance with head turns laterally   - 1 x daily - 7 x weekly - 2 sets - 10 reps  GOALS: Goals reviewed with patient? Yes  SHORT TERM GOALS: Target date: 08/25/2022      Patient will be independent in home exercise program to improve strength/mobility for better functional independence with ADLs. Baseline: No HEP currently 6/12: Completing HEP 1-2x/day, pt is independent Goal status: MET     LONG TERM GOALS: Target date: 10/20/2022   1.  Patient (> 62 years old) will complete five times sit to stand test in < 13 seconds indicating an increased LE strength and improved balance. Baseline: 15.77 sec; 09/02/22: 10 seconds  Goal status: MET  2.  Patient will increase FOTO score to equal to or greater than  68   to demonstrate statistically significant improvement in mobility and quality of life.  Baseline: 63; 09/02/22 63 Goal status: ONGOING   3.  Patient will increase mini BEST Balance score by >  4 points to demonstrate decreased fall risk during functional activities. Baseline: 17; 09/02/2022: 22  Goal status: MET  4.  Patient will improve SLB to at least 5 seconds each LE in order to decrease fall risk with gait and obstacle negotiation. Baseline: 09/02/2022: <3 sec each LE Goal status: NEW      ASSESSMENT:  CLINICAL IMPRESSION: Patient appeared motivated and ready for treatment on this day. Continuing high level static and dynamic balance exercises, focusing on incorporation of blaze pods to work on multi height and multi directional movements working towards the goal of  increasing SLS stability and endurance, per patients concern of being able to stand on one leg.  Pt currently having multiple LOB instances during session, unable to independently correct without min assist from therapist. For now Pt will continue to benefit from skilled physical therapy intervention to address impairments, improve QOL, and attain therapy goals. Will assess need for continuation of therapy next session per pt's subjective report above.   OBJECTIVE IMPAIRMENTS: Abnormal gait and decreased balance.   ACTIVITY LIMITATIONS: standing, squatting, and stairs  PARTICIPATION LIMITATIONS: community activity and yard work  PERSONAL FACTORS: Age, Time since onset of injury/illness/exacerbation, and 3+ comorbidities: THA, CKD, HLD, Atherosclerosis  are also affecting patient's functional outcome.   REHAB POTENTIAL: Excellent  CLINICAL DECISION MAKING: Stable/uncomplicated  EVALUATION COMPLEXITY: Low  PLAN:  PT FREQUENCY: 2x/week  PT DURATION: 12 weeks  PLANNED INTERVENTIONS: Therapeutic exercises, Therapeutic activity, Neuromuscular re-education, Balance training, Gait training, Patient/Family education, Self Care, Joint mobilization, Manual therapy, and Re-evaluation  PLAN FOR NEXT SESSION:  high level static balance activities and progression to dynamic as appropriate. resisted gait,BLE hip strengthening.     Cecile Sheerer, SPT  Physical Therapist - [place]  [facility] [address]  4:23 PM 09/16/22    I have read and reviewed the attached note and am in agreement with the documentation provided.     This licensed clinician was present and actively directing care throughout the session at all times.  Garrett Moore PT, DPT  Physical Therapist -   [facility]  4:26 PM 09/16/22

## 2022-09-21 ENCOUNTER — Ambulatory Visit: Payer: Medicare HMO | Attending: Family Medicine | Admitting: Physical Therapy

## 2022-09-21 DIAGNOSIS — R269 Unspecified abnormalities of gait and mobility: Secondary | ICD-10-CM | POA: Diagnosis not present

## 2022-09-21 DIAGNOSIS — M6281 Muscle weakness (generalized): Secondary | ICD-10-CM | POA: Insufficient documentation

## 2022-09-21 DIAGNOSIS — R262 Difficulty in walking, not elsewhere classified: Secondary | ICD-10-CM | POA: Insufficient documentation

## 2022-09-21 DIAGNOSIS — R2681 Unsteadiness on feet: Secondary | ICD-10-CM | POA: Insufficient documentation

## 2022-09-21 DIAGNOSIS — R2689 Other abnormalities of gait and mobility: Secondary | ICD-10-CM | POA: Diagnosis not present

## 2022-09-21 NOTE — Therapy (Signed)
OUTPATIENT PHYSICAL THERAPY NEURO TREATMENT NOTE/ Discharge Summary     Patient Name: Garrett Moore MRN: [id number] DOB:1940-12-19, 82 y.o., male Today's Date: 09/21/2022   PCP: Daisy Floro, MD  REFERRING PROVIDER: Daisy Floro, MD   END OF SESSION:  PT End of Session - 09/21/22 1106     Visit Number 12    Number of Visits 16    Date for PT Re-Evaluation 09/22/22    Progress Note Due on Visit 10    PT Start Time 1104    PT Stop Time 1145    PT Time Calculation (min) 41 min    Equipment Utilized During Treatment Gait belt    Activity Tolerance Patient tolerated treatment well    Behavior During Therapy [facility] for tasks assessed/performed                 Past Medical History:  Diagnosis Date   Aneurysm of infrarenal abdominal aorta (HCC) 02/08/2018   a.) TTE 02/08/2018: Ao root 38 mm, asc Ao 40 mm; b.) TTE 06/14/2020; asc Ao 28 mm; c.) CT hematuria 05/10/2020: infrarenal AAA measuring 5.9 cm; d.) s/p EVAR 07/03/2020; e.) CT A/P 10/14/2021: residual aneurysmal sac (s/p EVAR) measured 6.0 cm   Aortic atherosclerosis (HCC)    Aortic valvar stenosis 07/21/2012   a.) TTE 07/21/2012: EF 50-55%, mild AS (AVA = 1.67, MPG 9); b.) TTE 02/08/2018: EF 60-65%; mod AS (MPG 17); c.) TTE 06/14/2020: EF 60-65%, mod AS (MPG 26.8)   Atherosclerosis of native arteries of extremity with intermittent claudication (HCC)    Basal cell carcinoma, face    BPH (benign prostatic hyperplasia)    Carotid artery stenosis 05/21/2006   a.) carotid US 05/21/2006: 0-49% BICA; b.) carotid US 06/10/2020: 1-39% RICA, total occ LICA   CKD (chronic kidney disease), stage III (HCC)    COPD (chronic obstructive pulmonary disease) (HCC)    Coronary artery disease 08/14/1983   a.) MI --> LHC 08/13/2016: CTO of the RCA --> PTCA performed resulting in 30% residual stenosis; aberrant takeoff of his RCA coming off somewhat high and posteriorly.   Diastolic dysfunction 02/08/2018   a.) TTE  02/08/2018: EF 60-65%, mild MAC,  triv TR, mild-mod AS, G1DD; b.) TTE 06/14/2020: EF 60-65%, mod AS, triv TR, mild AR, G1DD   Diverticulosis    Family history of adverse reaction to anesthesia    a.) extended GA course for mother --> postoperaitve memory problems/dementia   GERD (gastroesophageal reflux disease)    Glaucoma    Heart murmur    History of kidney stones    Hyperlipidemia    Iliac artery aneurysm, bilateral (HCC) 07/03/2020   a.) s/p insertion of BILATERAL iliac artery stents   Myocardial infarction (HCC) 08/13/2016   a.) MI --> LHC 08/13/2016: CTO of the RCA --> PTCA performed resulting in 30% residual stenosis. Procedure complicated by procedural nausea, Patient went into IVR and then AV disaccociated rhythm. SBP dropped (80s). TVP floated with capture, which stabilized patient. ICU admission overnight.   OA (osteoarthritis)    Peripheral vascular disease (HCC)    Postoperative deep vein thrombosis (DVT) (HCC)    a.) postop age 42 following tonsillectomy (per patient report)   Past Surgical History:  Procedure Laterality Date   CATARACT EXTRACTION, BILATERAL  01/2019   CORONARY ANGIOPLASTY  08/14/1983   Procedure: CORONARY ANGIOPLASTY (PTCA pRCA); Location: [facility]; Surgeon: Al Little, MD   CYSTOSCOPY W/ RETROGRADES  08/12/2020   Procedure: CYSTOSCOPY WITH RETROGRADE PYELOGRAM;  Surgeon:  Vanna Scotland, MD;  Location: [facility];  Service: Urology;;   CYSTOSCOPY WITH INSERTION OF UROLIFT N/A 12/22/2021   Procedure: CYSTOSCOPY WITH INSERTION OF UROLIFT;  Surgeon: Vanna Scotland, MD;  Location: [facility];  Service: Urology;  Laterality: N/A;   CYSTOSCOPY/URETEROSCOPY/HOLMIUM LASER/STENT PLACEMENT Left 08/12/2020   Procedure: CYSTOSCOPY/URETEROSCOPY/HOLMIUM LASER/STENT PLACEMENT;  Surgeon: Vanna Scotland, MD;  Location: [facility];  Service: Urology;  Laterality: Left;   ENDOVASCULAR REPAIR/STENT GRAFT N/A 07/03/2020   Procedure: ENDOVASCULAR REPAIR/STENT GRAFT;  Surgeon:  Renford Dills, MD;  Location: [facility];  Service: Cardiovascular;  Laterality: N/A;   PELVIC ANGIOGRAPHY Right 10/14/2021   Procedure: PELVIC ANGIOGRAPHY;  Surgeon: Renford Dills, MD;  Location: [facility];  Service: Cardiovascular;  Laterality: Right;   PERIPHERAL VASCULAR THROMBECTOMY Right 10/14/2021   Procedure: PERIPHERAL VASCULAR THROMBECTOMY;  Surgeon: Renford Dills, MD;  Location: [facility];  Service: Cardiovascular;  Laterality: Right;   ROTATOR CUFF REPAIR Left    TEMPORARY PACEMAKER  08/14/1983   Procedure: TEMPORARY PACEMAKER PLACEMENT; Location: [facility]; Surgeon: Al Little, MD   TONSILLECTOMY     TOTAL HIP ARTHROPLASTY Left 02/21/2019   Procedure: TOTAL HIP ARTHROPLASTY ANTERIOR APPROACH;  Surgeon: Durene Romans, MD;  Location: [facility];  Service: Orthopedics;  Laterality: Left;  70 mins   VITRECTOMY Bilateral    Patient Active Problem List   Diagnosis Date Noted   Moderate aortic stenosis 05/20/2022   Chronic diastolic CHF (congestive heart failure) (HCC) 05/20/2022   Paroxysmal atrial fibrillation (HCC) 05/20/2022   Vision loss of left eye 05/20/2022   Acute ischemic stroke (HCC) 05/19/2022   Atherosclerosis of native arteries of extremity with intermittent claudication (HCC) 09/28/2021   Aortic valve disease 08/05/2020   Atherosclerotic heart disease of native coronary artery without angina pectoris 08/05/2020   Basal cell carcinoma of skin 08/05/2020   Benign prostatic hyperplasia 08/05/2020   Stage 3a chronic kidney disease (CKD) (HCC) 08/05/2020   COPD (chronic obstructive pulmonary disease) (HCC) 08/05/2020   Frequent fecal incontinence 08/05/2020   Glaucoma 08/05/2020   Hyperlipidemia 08/05/2020   Other long term (current) drug therapy 08/05/2020   AAA (abdominal aortic aneurysm) (HCC) 07/03/2020   AAA (abdominal aortic aneurysm) without rupture (HCC) 05/26/2020   S/P left THA, AA 02/21/2019   Status post total  hip replacement, left 02/21/2019    ONSET DATE: 08/21/21  REFERRING DIAG: R26.89 (ICD-10-CM) - Balance problem   THERAPY DIAG:  Unsteadiness on feet  Difficulty in walking, not elsewhere classified  Abnormality of gait and mobility  Other abnormalities of gait and mobility  Muscle weakness (generalized)  Rationale for Evaluation and Treatment: Rehabilitation  SUBJECTIVE:  SUBJECTIVE STATEMENT: Pt reports doing well today, ready to d/c from PT services at this time.   Pt accompanied by: self  PERTINENT HISTORY: Pt reports he noticed he has trouble when he first gets up and he tends to stumble, he recalls no dizziness when he gets up or lightheadedness. He recalls no falls in the last 6 months but has had many stumbles where he has caught himself. Pt went to ER following 1 minute of visual changes where he could not see out of his left eye, no changes in his visual field when he was just looking out of his right eye. No significant findings of stroke per ER report and notes and per pt report. Eye doctor also does not know why the acute visual change occurred.  Pt main goal for PT is to improve his balance. Pt reports he favors his left leg per report of observers.   PAIN:  Are you having pain? No  PRECAUTIONS: None  WEIGHT BEARING RESTRICTIONS: No  FALLS: Has patient fallen in last 6 months? No  LIVING ENVIRONMENT: Lives with: lives with their family and lives with their spouse Lives in: House/apartment Stairs: No Has following equipment at home: Single point cane and only uses cane if his legs are stiff on a given day.   PLOF: Independent  PATIENT GOALS: Improve his balance  OBJECTIVE: (objective measures completed at initial evaluation unless otherwise dated)   DIAGNOSTIC FINDINGS:  N/A, no acute findings for CVA or other abnormalities at ER in February   COGNITION: Overall cognitive status: Within functional limits for tasks assessed   SENSATION: Not tested  COORDINATION: WNL   POSTURE: No Significant postural limitations   LOWER EXTREMITY MMT:    MMT Right Eval Left Eval  Hip flexion 5 5  Hip extension    Hip abduction 5 5  Hip adduction 5 5  Hip internal rotation 5 5  Hip external rotation 5 5  Knee flexion 5 5  Knee extension 5 5  Ankle dorsiflexion 5 5  Ankle plantarflexion 4+ 4+  Ankle inversion    Ankle eversion    (Blank rows = not tested) All tested in seated position   STAIRS: Level of Assistance: Complete Independence Stair Negotiation Technique: Alternating Pattern  with No Rails Number of Stairs: 4  Height of Stairs: 6 in  Comments: some hesitancy with descending steps   GAIT: Gait pattern:  With quick turnaround and pivot turn patient demonstrates poor single-leg stance stability and also utilizes several narrow base of support steps to recover balance from this acute loss of balance.  and step through pattern Distance walked: 30 ft Assistive device utilized: None Level of assistance: Complete Independence Comments: Difficulty with more dynamic balance tasks with simple ambulation no significant errors or limitations.  FUNCTIONAL TESTS:  5 times sit to stand: 9.42sec  MiniBEST 23/30       PATIENT SURVEYS:  FOTO 77  TODAY'S TREATMENT:  DATE: 09/21/22  PT instructed pt in Discharge assessment to measure progress toward goals. See flowsheet and below for details.  Performed 5xSTS 9.4 sec.  MiniBest: 23/30  Tug: 10.11 sec  cognitive tug 11.17 sec   PT introduced to [facility] with encouragement to utilize community gym resources to maintain strength and balance improvements that have been see over  the last 2 months     PATIENT EDUCATION: Education details: POC; Pt educated throughout session about proper posture and technique with exercises. Improved exercise technique, movement at target joints, use of target muscles after min to mod verbal, visual, tactile cues.  Person educated: Patient Education method: Explanation Education comprehension: verbalized understanding  HOME EXERCISE PROGRAM: Access Code: [email or web address] URL: [email or web address] Date: 07/29/2022 Prepared by: Thresa Ross  Exercises - Standing Marching  - 1 x daily - 5 x weekly - 1 sets - 10 reps - 3 second hold - Standing Romberg to 3/4 Tandem Stance  - 1 x daily - 7 x weekly - 2 sets - 45 sec hold - Standing Single Leg Stance with Counter Support  - 1 x daily - 7 x weekly - 3 sets - 30 sec  hold - standing in split stance with vertical head nods   - 1 x daily - 7 x weekly - 3 sets - 10 reps - romberg stance with head turns laterally   - 1 x daily - 7 x weekly - 2 sets - 10 reps  GOALS: Goals reviewed with patient? Yes  SHORT TERM GOALS: Target date: 08/25/2022      Patient will be independent in home exercise program to improve strength/mobility for better functional independence with ADLs. Baseline: No HEP currently 6/12: Completing HEP 1-2x/day, pt is independent Goal status: MET     LONG TERM GOALS: Target date: 10/20/2022   1.  Patient (> 65 years old) will complete five times sit to stand test in < 13 seconds indicating an increased LE strength and improved balance. Baseline: 15.77 sec; 09/02/22: 10 seconds 09/21/2022: 9.42sec no UE support  Goal status: MET  2.  Patient will increase FOTO score to equal to or greater than  68   to demonstrate statistically significant improvement in mobility and quality of life.  Baseline: 63; 09/02/22 63  09/21/2022: 77  Goal status: MET   3.  Patient will increase mini BEST Balance score by > 4 points to demonstrate decreased fall risk during  functional activities.  Baseline: 17; 09/02/2022: 22 09/21/2022: 23 Goal status: MET   4.  Patient will improve SLB to at least 5 seconds each LE in order to decrease fall risk with gait and obstacle negotiation. Baseline: 09/02/2022: <3 sec each LE  7/1: 5.5 R, 6 L  Goal status: MET       ASSESSMENT:  CLINICAL IMPRESSION: Patient appeared motivated and ready for treatment on this day. Put forth excellent effort throughout sessio for re-assessment of long term goals. Pt has met 4 of 4 long term goals demonstrating improved balance with increase in MiniBest by 6 points , 5x STS by 6 sec, SLS to >5 sec bil without UE support , and increase in Foto to 77 from 63. Pt in agreement to d/c PT services at this time. Encouraged by PT to utilize [facility] or [facility] community gym to maintain progress in strength  and balance within the community . Pt verbalized understanding of benefits of use of community resources.    OBJECTIVE IMPAIRMENTS: Abnormal gait and decreased balance.  ACTIVITY LIMITATIONS: standing, squatting, and stairs  PARTICIPATION LIMITATIONS: community activity and yard work  PERSONAL FACTORS: Age, Time since onset of injury/illness/exacerbation, and 3+ comorbidities: THA, CKD, HLD, Atherosclerosis  are also affecting patient's functional outcome.   REHAB POTENTIAL: Excellent  CLINICAL DECISION MAKING: Stable/uncomplicated  EVALUATION COMPLEXITY: Low  PLAN:  PT FREQUENCY: 2x/week  PT DURATION: 12 weeks  PLANNED INTERVENTIONS: Therapeutic exercises, Therapeutic activity, Neuromuscular re-education, Balance training, Gait training, Patient/Family education, Self Care, Joint mobilization, Manual therapy, and Re-evaluation  PLAN FOR NEXT SESSION:   D/C from PT services    Grier Rocher PT, DPT  Physical Therapist - [facility]  1:07 PM 09/21/22

## 2022-09-28 ENCOUNTER — Ambulatory Visit: Payer: Medicare HMO | Admitting: Physical Therapy

## 2022-10-02 ENCOUNTER — Ambulatory Visit: Payer: Medicare HMO | Admitting: Physical Therapy

## 2022-10-05 ENCOUNTER — Ambulatory Visit: Payer: Medicare HMO | Admitting: Physical Therapy

## 2022-10-07 ENCOUNTER — Ambulatory Visit: Payer: Medicare HMO | Admitting: Physical Therapy

## 2022-10-12 ENCOUNTER — Ambulatory Visit: Payer: Medicare HMO | Admitting: Physical Therapy

## 2022-10-14 ENCOUNTER — Ambulatory Visit: Payer: Medicare HMO | Admitting: Physical Therapy

## 2022-10-18 ENCOUNTER — Ambulatory Visit
Admission: EM | Admit: 2022-10-18 | Discharge: 2022-10-18 | Disposition: A | Discharge status: Home / Self Care | Payer: Medicare HMO | Attending: Emergency Medicine | Admitting: Emergency Medicine

## 2022-10-18 DIAGNOSIS — J449 Chronic obstructive pulmonary disease, unspecified: Secondary | ICD-10-CM | POA: Insufficient documentation

## 2022-10-18 DIAGNOSIS — U071 COVID-19: Secondary | ICD-10-CM | POA: Insufficient documentation

## 2022-10-18 DIAGNOSIS — N1831 Chronic kidney disease, stage 3a: Secondary | ICD-10-CM | POA: Diagnosis not present

## 2022-10-18 DIAGNOSIS — I252 Old myocardial infarction: Secondary | ICD-10-CM | POA: Diagnosis not present

## 2022-10-18 DIAGNOSIS — Z8679 Personal history of other diseases of the circulatory system: Secondary | ICD-10-CM | POA: Diagnosis not present

## 2022-10-18 DIAGNOSIS — I714 Abdominal aortic aneurysm, without rupture, unspecified: Secondary | ICD-10-CM | POA: Insufficient documentation

## 2022-10-18 DIAGNOSIS — B349 Viral infection, unspecified: Secondary | ICD-10-CM | POA: Insufficient documentation

## 2022-10-18 DIAGNOSIS — Z7902 Long term (current) use of antithrombotics/antiplatelets: Secondary | ICD-10-CM | POA: Diagnosis not present

## 2022-10-18 DIAGNOSIS — I5032 Chronic diastolic (congestive) heart failure: Secondary | ICD-10-CM | POA: Insufficient documentation

## 2022-10-18 MED ORDER — BENZONATATE 100 MG PO CAPS: 100.0000 mg | ORAL_CAPSULE | Freq: Three times a day (TID) | ORAL | 0 refills | Status: DC | PRN

## 2022-10-18 NOTE — ED Triage Notes (Signed)
Patient to Urgent Care with complaints of fatigue, describes feeling "worn out", dry cough, nasal congestion. Denies any known fevers.   Reports symptoms started four days ago. Has been taking cough medications twice daily.

## 2022-10-18 NOTE — ED Provider Notes (Signed)
Garrett Moore    CSN: [id number] Arrival date & time: 10/18/22  1246      History   Chief Complaint Chief Complaint  Patient presents with   Nasal Congestion    HPI Garrett Moore is a 82 y.o. male.  Accompanied by his wife, patient presents with fatigue, congestion, nonproductive cough x 3 days.  Treatment with OTC cough medication.  He denies fever, chest pain, shortness of breath, or other symptoms.  His medical history includes COPD, heart failure, MI, AAA, CKD 3.  He is on Plavix.  The history is provided by the patient, the spouse and medical records.    Past Medical History:  Diagnosis Date   Aneurysm of infrarenal abdominal aorta (HCC) 02/08/2018   a.) TTE 02/08/2018: Ao root 38 mm, asc Ao 40 mm; b.) TTE 06/14/2020; asc Ao 28 mm; c.) CT hematuria 05/10/2020: infrarenal AAA measuring 5.9 cm; d.) s/p EVAR 07/03/2020; e.) CT A/P 10/14/2021: residual aneurysmal sac (s/p EVAR) measured 6.0 cm   Aortic atherosclerosis (HCC)    Aortic valvar stenosis 07/21/2012   a.) TTE 07/21/2012: EF 50-55%, mild AS (AVA = 1.67, MPG 9); b.) TTE 02/08/2018: EF 60-65%; mod AS (MPG 17); c.) TTE 06/14/2020: EF 60-65%, mod AS (MPG 26.8)   Atherosclerosis of native arteries of extremity with intermittent claudication (HCC)    Basal cell carcinoma, face    BPH (benign prostatic hyperplasia)    Carotid artery stenosis 05/21/2006   a.) carotid US 05/21/2006: 0-49% BICA; b.) carotid US 06/10/2020: 1-39% RICA, total occ LICA   CKD (chronic kidney disease), stage III (HCC)    COPD (chronic obstructive pulmonary disease) (HCC)    Coronary artery disease 08/14/1983   a.) MI --> LHC 08/13/2016: CTO of the RCA --> PTCA performed resulting in 30% residual stenosis; aberrant takeoff of his RCA coming off somewhat high and posteriorly.   Diastolic dysfunction 02/08/2018   a.) TTE 02/08/2018: EF 60-65%, mild MAC,  triv TR, mild-mod AS, G1DD; b.) TTE 06/14/2020: EF 60-65%, mod AS, triv TR, mild AR, G1DD    Diverticulosis    Family history of adverse reaction to anesthesia    a.) extended GA course for mother --> postoperaitve memory problems/dementia   GERD (gastroesophageal reflux disease)    Glaucoma    Heart murmur    History of kidney stones    Hyperlipidemia    Iliac artery aneurysm, bilateral (HCC) 07/03/2020   a.) s/p insertion of BILATERAL iliac artery stents   Myocardial infarction (HCC) 08/13/2016   a.) MI --> LHC 08/13/2016: CTO of the RCA --> PTCA performed resulting in 30% residual stenosis. Procedure complicated by procedural nausea, Patient went into IVR and then AV disaccociated rhythm. SBP dropped (80s). TVP floated with capture, which stabilized patient. ICU admission overnight.   OA (osteoarthritis)    Peripheral vascular disease (HCC)    Postoperative deep vein thrombosis (DVT) (HCC)    a.) postop age 61 following tonsillectomy (per patient report)    Patient Active Problem List   Diagnosis Date Noted   Moderate aortic stenosis 05/20/2022   Chronic diastolic CHF (congestive heart failure) (HCC) 05/20/2022   Paroxysmal atrial fibrillation (HCC) 05/20/2022   Vision loss of left eye 05/20/2022   Acute ischemic stroke (HCC) 05/19/2022   Atherosclerosis of native arteries of extremity with intermittent claudication (HCC) 09/28/2021   Aortic valve disease 08/05/2020   Atherosclerotic heart disease of native coronary artery without angina pectoris 08/05/2020   Basal cell carcinoma of skin 08/05/2020  Benign prostatic hyperplasia 08/05/2020   Stage 3a chronic kidney disease (CKD) (HCC) 08/05/2020   COPD (chronic obstructive pulmonary disease) (HCC) 08/05/2020   Frequent fecal incontinence 08/05/2020   Glaucoma 08/05/2020   Hyperlipidemia 08/05/2020   Other long term (current) drug therapy 08/05/2020   AAA (abdominal aortic aneurysm) (HCC) 07/03/2020   AAA (abdominal aortic aneurysm) without rupture (HCC) 05/26/2020   S/P left THA, AA 02/21/2019   Status post total  hip replacement, left 02/21/2019    Past Surgical History:  Procedure Laterality Date   CATARACT EXTRACTION, BILATERAL  01/2019   CORONARY ANGIOPLASTY  08/14/1983   Procedure: CORONARY ANGIOPLASTY (PTCA pRCA); Location: [facility]; Surgeon: Al Little, MD   CYSTOSCOPY W/ RETROGRADES  08/12/2020   Procedure: CYSTOSCOPY WITH RETROGRADE PYELOGRAM;  Surgeon: Vanna Scotland, MD;  Location: [facility];  Service: Urology;;   CYSTOSCOPY WITH INSERTION OF UROLIFT N/A 12/22/2021   Procedure: CYSTOSCOPY WITH INSERTION OF UROLIFT;  Surgeon: Vanna Scotland, MD;  Location: [facility];  Service: Urology;  Laterality: N/A;   CYSTOSCOPY/URETEROSCOPY/HOLMIUM LASER/STENT PLACEMENT Left 08/12/2020   Procedure: CYSTOSCOPY/URETEROSCOPY/HOLMIUM LASER/STENT PLACEMENT;  Surgeon: Vanna Scotland, MD;  Location: [facility];  Service: Urology;  Laterality: Left;   ENDOVASCULAR REPAIR/STENT GRAFT N/A 07/03/2020   Procedure: ENDOVASCULAR REPAIR/STENT GRAFT;  Surgeon: Renford Dills, MD;  Location: [facility];  Service: Cardiovascular;  Laterality: N/A;   PELVIC ANGIOGRAPHY Right 10/14/2021   Procedure: PELVIC ANGIOGRAPHY;  Surgeon: Renford Dills, MD;  Location: [facility];  Service: Cardiovascular;  Laterality: Right;   PERIPHERAL VASCULAR THROMBECTOMY Right 10/14/2021   Procedure: PERIPHERAL VASCULAR THROMBECTOMY;  Surgeon: Renford Dills, MD;  Location: [facility];  Service: Cardiovascular;  Laterality: Right;   ROTATOR CUFF REPAIR Left    TEMPORARY PACEMAKER  08/14/1983   Procedure: TEMPORARY PACEMAKER PLACEMENT; Location: [facility]; Surgeon: Al Little, MD   TONSILLECTOMY     TOTAL HIP ARTHROPLASTY Left 02/21/2019   Procedure: TOTAL HIP ARTHROPLASTY ANTERIOR APPROACH;  Surgeon: Durene Romans, MD;  Location: [facility];  Service: Orthopedics;  Laterality: Left;  70 mins   VITRECTOMY Bilateral        Home Medications    Prior to Admission medications   Medication Sig Start  Date End Date Taking? Authorizing Provider  benzonatate (TESSALON) 100 MG capsule Take 1 capsule (100 mg total) by mouth 3 (three) times daily as needed for cough. 10/18/22  Yes Mickie Bail, NP  clopidogrel (PLAVIX) 75 MG tablet Take 1 tablet (75 mg total) by mouth daily. 05/25/22   Antonieta Iba, MD  Multiple Vitamin (MULTIVITAMIN WITH MINERALS) TABS tablet Take 1 tablet by mouth daily.    [provider]  nicotine polacrilex (COMMIT) 2 MG lozenge Take 2 mg by mouth as needed for smoking cessation.    [provider]  simvastatin (ZOCOR) 80 MG tablet Take 80 mg by mouth at bedtime.    [provider]    Family History Family History  Problem Relation Age of Onset   Heart Problems Mother    AAA (abdominal aortic aneurysm) Mother     Social History Social History   Tobacco Use   Smoking status: Former    Current packs/day: 0.00    Average packs/day: 2.0 packs/day for 50.0 years (100.0 ttl pk-yrs)    Types: Cigarettes    Start date: 58    Quit date: 2004    Years since quitting: 20.5   Smokeless tobacco: Never   Tobacco comments:  quit 17 years ago  Vaping Use   Vaping status: Never Used  Substance Use Topics   Alcohol use: Not Currently   Drug use: Never     Allergies   Quinolones and Sulfa antibiotics   Review of Systems Review of Systems  Constitutional:  Positive for fatigue. Negative for chills and fever.  HENT:  Positive for congestion. Negative for ear pain and sore throat.   Respiratory:  Positive for cough. Negative for shortness of breath.   Cardiovascular:  Negative for chest pain and palpitations.     Physical Exam Triage Vital Signs ED Triage Vitals  Encounter Vitals Group     BP 10/18/22 1331 115/67     Systolic BP Percentile --      Diastolic BP Percentile --      Pulse Rate 10/18/22 1314 92     Resp 10/18/22 1314 18     Temp 10/18/22 1314 98.7 F (37.1 C)     Temp src --      SpO2 10/18/22 1314 93 %      Weight --      Height --      Head Circumference --      Peak Flow --      Pain Score 10/18/22 1328 0     Pain Loc --      Pain Education --      Exclude from Growth Chart --    No data found.  Updated Vital Signs BP 115/67   Pulse 92   Temp 98.7 F (37.1 C)   Resp 18   SpO2 96%   Visual Acuity Right Eye Distance:   Left Eye Distance:   Bilateral Distance:    Right Eye Near:   Left Eye Near:    Bilateral Near:     Physical Exam Constitutional:      General: He is not in acute distress. HENT:     Right Ear: Tympanic membrane normal.     Left Ear: Tympanic membrane normal.     Nose: Nose normal.     Mouth/Throat:     Mouth: Mucous membranes are moist.     Pharynx: Oropharynx is clear.  Cardiovascular:     Rate and Rhythm: Normal rate and regular rhythm.     Heart sounds: Normal heart sounds.  Pulmonary:     Effort: Pulmonary effort is normal. No respiratory distress.     Breath sounds: Normal breath sounds.  Skin:    General: Skin is warm and dry.  Neurological:     Mental Status: He is alert.      UC Treatments / Results  Labs (all labs ordered are listed, but only abnormal results are displayed) Labs Reviewed  SARS CORONAVIRUS 2 (TAT 6-24 HRS)    EKG   Radiology No results found.  Procedures Procedures (including critical care time)  Medications Ordered in UC Medications - No data to display  Initial Impression / Assessment and Plan / UC Course  I have reviewed the triage vital signs and the nursing notes.  Pertinent labs & imaging results that were available during my care of the patient were reviewed by me and considered in my medical decision making (see chart for details).   Viral illness.  Afebrile and vital signs are stable.  O2 sat 96% on room air and lungs are clear at this time.  COVID test pending.  If positive for COVID, recommend treatment with Paxlovid due to his medical history.  Pause statin  while on Paxlovid.  GFR 52 on  05/20/2022.  Instructed patient to follow-up with his PCP tomorrow.  ED precautions given.  Education provided on viral illness.  He agrees to plan of care.  Final Clinical Impressions(s) / UC Diagnoses   Final diagnoses:  Viral illness     Discharge Instructions      Your COVID test is pending.    Take the Tessalon as directed for cough.  Take Tylenol as needed for fever or discomfort.  Rest and keep yourself hydrated.    Follow-up with your primary care provider.         ED Prescriptions     Medication Sig Dispense Auth. Provider   benzonatate (TESSALON) 100 MG capsule Take 1 capsule (100 mg total) by mouth 3 (three) times daily as needed for cough. 21 capsule Mickie Bail, NP      PDMP not reviewed this encounter.   Mickie Bail, NP 10/18/22 1355

## 2022-10-18 NOTE — Discharge Instructions (Addendum)
Your COVID test is pending.    Take the Tessalon as directed for cough.  Take Tylenol as needed for fever or discomfort.  Rest and keep yourself hydrated.    Follow-up with your primary care provider.

## 2022-10-19 ENCOUNTER — Ambulatory Visit: Payer: Medicare HMO

## 2022-10-20 ENCOUNTER — Telehealth (HOSPITAL_COMMUNITY): Payer: Self-pay | Admitting: Emergency Medicine

## 2022-10-20 DIAGNOSIS — U071 COVID-19: Secondary | ICD-10-CM | POA: Diagnosis not present

## 2022-10-20 MED ORDER — PAXLOVID (150/100) 10 X 150 MG & 10 X 100MG PO TBPK: 2.0000 | ORAL_TABLET | Freq: Two times a day (BID) | ORAL | 0 refills | Status: AC

## 2022-10-22 ENCOUNTER — Ambulatory Visit: Payer: Medicare HMO

## 2022-10-26 DIAGNOSIS — R059 Cough, unspecified: Secondary | ICD-10-CM | POA: Diagnosis not present

## 2022-10-26 DIAGNOSIS — Z6824 Body mass index (BMI) 24.0-24.9, adult: Secondary | ICD-10-CM | POA: Diagnosis not present

## 2022-10-27 ENCOUNTER — Ambulatory Visit: Payer: Medicare HMO

## 2022-11-02 ENCOUNTER — Ambulatory Visit: Payer: Medicare HMO | Admitting: Physical Therapy

## 2022-11-04 ENCOUNTER — Ambulatory Visit: Payer: Medicare HMO | Admitting: Physical Therapy

## 2022-11-09 ENCOUNTER — Ambulatory Visit: Payer: Medicare HMO

## 2022-11-17 ENCOUNTER — Ambulatory Visit: Payer: Medicare HMO

## 2022-11-19 ENCOUNTER — Ambulatory Visit: Payer: Medicare HMO | Admitting: Physical Therapy

## 2022-11-20 DIAGNOSIS — H40023 Open angle with borderline findings, high risk, bilateral: Secondary | ICD-10-CM | POA: Diagnosis not present

## 2022-11-20 DIAGNOSIS — G459 Transient cerebral ischemic attack, unspecified: Secondary | ICD-10-CM | POA: Diagnosis not present

## 2022-11-20 DIAGNOSIS — H35033 Hypertensive retinopathy, bilateral: Secondary | ICD-10-CM | POA: Diagnosis not present

## 2022-11-26 ENCOUNTER — Ambulatory Visit: Payer: Medicare HMO

## 2022-11-30 ENCOUNTER — Ambulatory Visit: Payer: Medicare HMO | Admitting: Physical Therapy

## 2022-12-03 ENCOUNTER — Ambulatory Visit: Payer: Medicare HMO

## 2022-12-07 ENCOUNTER — Ambulatory Visit: Payer: Medicare HMO | Admitting: Physical Therapy

## 2022-12-10 ENCOUNTER — Ambulatory Visit: Payer: Medicare HMO | Admitting: Physical Therapy

## 2022-12-14 ENCOUNTER — Ambulatory Visit: Payer: Medicare HMO | Admitting: Physical Therapy

## 2022-12-17 ENCOUNTER — Ambulatory Visit: Payer: Medicare HMO | Admitting: Physical Therapy

## 2022-12-18 ENCOUNTER — Other Ambulatory Visit (INDEPENDENT_AMBULATORY_CARE_PROVIDER_SITE_OTHER): Payer: Self-pay | Admitting: Nurse Practitioner

## 2022-12-18 DIAGNOSIS — I6523 Occlusion and stenosis of bilateral carotid arteries: Secondary | ICD-10-CM

## 2022-12-18 DIAGNOSIS — I714 Abdominal aortic aneurysm, without rupture, unspecified: Secondary | ICD-10-CM

## 2022-12-18 DIAGNOSIS — I739 Peripheral vascular disease, unspecified: Secondary | ICD-10-CM

## 2022-12-21 ENCOUNTER — Ambulatory Visit: Payer: Medicare HMO | Admitting: Physical Therapy

## 2022-12-23 DIAGNOSIS — H40023 Open angle with borderline findings, high risk, bilateral: Secondary | ICD-10-CM | POA: Diagnosis not present

## 2022-12-24 ENCOUNTER — Ambulatory Visit (INDEPENDENT_AMBULATORY_CARE_PROVIDER_SITE_OTHER): Payer: Medicare HMO

## 2022-12-24 ENCOUNTER — Ambulatory Visit: Payer: Medicare HMO | Admitting: Physical Therapy

## 2022-12-24 DIAGNOSIS — I6523 Occlusion and stenosis of bilateral carotid arteries: Secondary | ICD-10-CM | POA: Diagnosis not present

## 2022-12-24 DIAGNOSIS — I739 Peripheral vascular disease, unspecified: Secondary | ICD-10-CM

## 2022-12-24 DIAGNOSIS — I714 Abdominal aortic aneurysm, without rupture, unspecified: Secondary | ICD-10-CM | POA: Diagnosis not present

## 2022-12-28 ENCOUNTER — Ambulatory Visit: Payer: Medicare HMO | Admitting: Physical Therapy

## 2022-12-29 NOTE — Progress Notes (Unsigned)
Referring Physician:  No referring provider defined for this encounter.  Primary Physician:  Daisy Floro, MD  History of Present Illness: 12/30/2022 [name] has a history of COPD, heart failure, MI, AAA, CKD 3, afib, glaucoma, hyperlipidemia.   He had left THA on 02/21/19.   He is taking PLAVIX.   He has no LBP. He has 3-4 week history intermittent right leg pain- he has pain in groin when he tries to put his socks on. He has intermittent pain in the entire leg to his foot, but he feels like most of his pain is in the right hip. Pain is worse with bending and walking. He has numbness and tingling in his leg. Some improvement with heat. He has intermittent cramps/spasms in his leg, these can wake him up at night. Some pain with getting in/out of a car.   He was walking 7-8 miles a day but has cut down. Did PT earlier this year for his balance- not sure it helped.   Bowel/Bladder Dysfunction: none  Conservative measures:  Physical therapy: 12 visits of PT from 07/28/22-09/21/22 for his balance Multimodal medical therapy including regular antiinflammatories: tylenol   Injections: has not received epidural steroid injections  Past Surgery: no previous spinal surgeries  Garrett Moore has no symptoms of cervical myelopathy.  The symptoms are causing a significant impact on the patient's life.   Review of Systems:  A 10 point review of systems is negative, except for the pertinent positives and negatives detailed in the HPI.  Past Medical History: Past Medical History:  Diagnosis Date   Aneurysm of infrarenal abdominal aorta (HCC) 02/08/2018   a.) TTE 02/08/2018: Ao root 38 mm, asc Ao 40 mm; b.) TTE 06/14/2020; asc Ao 28 mm; c.) CT hematuria 05/10/2020: infrarenal AAA measuring 5.9 cm; d.) s/p EVAR 07/03/2020; e.) CT A/P 10/14/2021: residual aneurysmal sac (s/p EVAR) measured 6.0 cm   Aortic atherosclerosis (HCC)    Aortic valvar stenosis 07/21/2012   a.) TTE  07/21/2012: EF 50-55%, mild AS (AVA = 1.67, MPG 9); b.) TTE 02/08/2018: EF 60-65%; mod AS (MPG 17); c.) TTE 06/14/2020: EF 60-65%, mod AS (MPG 26.8)   Atherosclerosis of native arteries of extremity with intermittent claudication (HCC)    Basal cell carcinoma, face    BPH (benign prostatic hyperplasia)    Carotid artery stenosis 05/21/2006   a.) carotid US 05/21/2006: 0-49% BICA; b.) carotid US 06/10/2020: 1-39% RICA, total occ LICA   CKD (chronic kidney disease), stage III (HCC)    COPD (chronic obstructive pulmonary disease) (HCC)    Coronary artery disease 08/14/1983   a.) MI --> LHC 08/13/2016: CTO of the RCA --> PTCA performed resulting in 30% residual stenosis; aberrant takeoff of his RCA coming off somewhat high and posteriorly.   Diastolic dysfunction 02/08/2018   a.) TTE 02/08/2018: EF 60-65%, mild MAC,  triv TR, mild-mod AS, G1DD; b.) TTE 06/14/2020: EF 60-65%, mod AS, triv TR, mild AR, G1DD   Diverticulosis    Family history of adverse reaction to anesthesia    a.) extended GA course for mother --> postoperaitve memory problems/dementia   GERD (gastroesophageal reflux disease)    Glaucoma    Heart murmur    History of kidney stones    Hyperlipidemia    Iliac artery aneurysm, bilateral (HCC) 07/03/2020   a.) s/p insertion of BILATERAL iliac artery stents   Myocardial infarction (HCC) 08/13/2016   a.) MI --> LHC 08/13/2016: CTO of the RCA --> PTCA  performed resulting in 30% residual stenosis. Procedure complicated by procedural nausea, Patient went into IVR and then AV disaccociated rhythm. SBP dropped (80s). TVP floated with capture, which stabilized patient. ICU admission overnight.   OA (osteoarthritis)    Peripheral vascular disease (HCC)    Postoperative deep vein thrombosis (DVT) (HCC)    a.) postop age 66 following tonsillectomy (per patient report)    Past Surgical History: Past Surgical History:  Procedure Laterality Date   CATARACT EXTRACTION, BILATERAL  01/2019    CORONARY ANGIOPLASTY  08/14/1983   Procedure: CORONARY ANGIOPLASTY (PTCA pRCA); Location: [facility]; Surgeon: Al Little, MD   CYSTOSCOPY W/ RETROGRADES  08/12/2020   Procedure: CYSTOSCOPY WITH RETROGRADE PYELOGRAM;  Surgeon: Vanna Scotland, MD;  Location: [facility];  Service: Urology;;   CYSTOSCOPY WITH INSERTION OF UROLIFT N/A 12/22/2021   Procedure: CYSTOSCOPY WITH INSERTION OF UROLIFT;  Surgeon: Vanna Scotland, MD;  Location: [facility];  Service: Urology;  Laterality: N/A;   CYSTOSCOPY/URETEROSCOPY/HOLMIUM LASER/STENT PLACEMENT Left 08/12/2020   Procedure: CYSTOSCOPY/URETEROSCOPY/HOLMIUM LASER/STENT PLACEMENT;  Surgeon: Vanna Scotland, MD;  Location: [facility];  Service: Urology;  Laterality: Left;   ENDOVASCULAR REPAIR/STENT GRAFT N/A 07/03/2020   Procedure: ENDOVASCULAR REPAIR/STENT GRAFT;  Surgeon: Renford Dills, MD;  Location: [facility];  Service: Cardiovascular;  Laterality: N/A;   PELVIC ANGIOGRAPHY Right 10/14/2021   Procedure: PELVIC ANGIOGRAPHY;  Surgeon: Renford Dills, MD;  Location: [facility];  Service: Cardiovascular;  Laterality: Right;   PERIPHERAL VASCULAR THROMBECTOMY Right 10/14/2021   Procedure: PERIPHERAL VASCULAR THROMBECTOMY;  Surgeon: Renford Dills, MD;  Location: [facility];  Service: Cardiovascular;  Laterality: Right;   ROTATOR CUFF REPAIR Left    TEMPORARY PACEMAKER  08/14/1983   Procedure: TEMPORARY PACEMAKER PLACEMENT; Location: [facility]; Surgeon: Al Little, MD   TONSILLECTOMY     TOTAL HIP ARTHROPLASTY Left 02/21/2019   Procedure: TOTAL HIP ARTHROPLASTY ANTERIOR APPROACH;  Surgeon: Durene Romans, MD;  Location: [facility];  Service: Orthopedics;  Laterality: Left;  70 mins   VITRECTOMY Bilateral     Allergies: Allergies as of 12/30/2022 - Review Complete 12/30/2022  Allergen Reaction Noted   Quinolones Other (See Comments) 07/04/2020   Sulfa antibiotics Rash 11/03/2021    Medications: Outpatient Encounter  Medications as of 12/30/2022  Medication Sig   clopidogrel (PLAVIX) 75 MG tablet Take 1 tablet (75 mg total) by mouth daily.   Multiple Vitamin (MULTIVITAMIN WITH MINERALS) TABS tablet Take 1 tablet by mouth daily.   nicotine polacrilex (COMMIT) 2 MG lozenge Take 2 mg by mouth as needed for smoking cessation.   simvastatin (ZOCOR) 80 MG tablet Take 80 mg by mouth at bedtime.   [DISCONTINUED] benzonatate (TESSALON) 100 MG capsule Take 1 capsule (100 mg total) by mouth 3 (three) times daily as needed for cough.   No facility-administered encounter medications on file as of 12/30/2022.    Social History: Social History   Tobacco Use   Smoking status: Former    Current packs/day: 0.00    Average packs/day: 2.0 packs/day for 50.0 years (100.0 ttl pk-yrs)    Types: Cigarettes    Start date: 89    Quit date: 2004    Years since quitting: 20.7   Smokeless tobacco: Never   Tobacco comments:    quit 17 years ago  Vaping Use   Vaping status: Never Used  Substance Use Topics   Alcohol use: Not Currently   Drug use: Never    Family Medical History: Family History  Problem Relation Age of Onset   Heart Problems Mother    AAA (abdominal aortic aneurysm) Mother     Physical Examination: Vitals:   12/30/22 1001  BP: 128/70    General: Patient is well developed, well nourished, calm, collected, and in no apparent distress. Attention to examination is appropriate.  Respiratory: Patient is breathing without any difficulty.   NEUROLOGICAL:     Awake, alert, oriented to person, place, and time.  Speech is clear and fluent. Fund of knowledge is appropriate.   Cranial Nerves: Pupils equal round and reactive to light.  Facial tone is symmetric.    He has pain with IR/ER of his right hip. He has limited ROM of right hip with pain.   No abnormal lesions on exposed skin.   Strength: Side Biceps Triceps Deltoid Interossei Grip Wrist Ext. Wrist Flex.  R 5 5 5 5 5 5 5   L 5 5 5 5 5 5 5     Side Iliopsoas Quads Hamstring PF DF EHL  R 5 5 5 5 5 5   L 5 5 5 5 5 5    Reflexes are 2+ and symmetric at the biceps, brachioradialis, patella and achilles.   Hoffman's is absent.  Clonus is not present.   Bilateral upper and lower extremity sensation is intact to light touch.     Gait is normal.    Medical Decision Making  Imaging: No lumbar imaging.   Assessment and Plan: Garrett Moore is a pleasant 82 y.o. male who has a 3-4 week history of intermittent right leg pain- he has pain in groin when he tries to put his socks on. He has intermittent pain in the entire leg to his foot, but he feels like most of his pain is in the right hip. No LBP.   On exam, right groin pain is recreated with IR/ER of his right hip. He has limited/painful ROM of the right hip. His right groin pain appears hip mediated.   Treatment options discussed with patient and following plan made:   - He has follow up with Dr. Charlann Boxer for his right hip. I agree with this.  - I am happy to see him back as needed.   I spent a total of 20 minutes in face-to-face and non-face-to-face activities related to this patient's care today including review of outside records, review of imaging, review of symptoms, physical exam, discussion of differential diagnosis, discussion of treatment options, and documentation.   Thank you for involving me in the care of this patient.   Drake Leach PA-C Dept. of Neurosurgery

## 2022-12-30 ENCOUNTER — Encounter: Payer: Self-pay | Admitting: Orthopedic Surgery

## 2022-12-30 ENCOUNTER — Ambulatory Visit: Payer: Medicare HMO | Admitting: Orthopedic Surgery

## 2022-12-30 VITALS — BP 128/70 | Ht 71.0 in | Wt 176.4 lb

## 2022-12-30 DIAGNOSIS — M25551 Pain in right hip: Secondary | ICD-10-CM

## 2022-12-31 ENCOUNTER — Ambulatory Visit: Payer: Medicare HMO | Admitting: Physical Therapy

## 2023-01-04 ENCOUNTER — Ambulatory Visit: Payer: Medicare HMO | Admitting: Physical Therapy

## 2023-01-06 ENCOUNTER — Ambulatory Visit: Payer: Medicare HMO | Attending: Physician Assistant

## 2023-01-06 DIAGNOSIS — I350 Nonrheumatic aortic (valve) stenosis: Secondary | ICD-10-CM | POA: Diagnosis not present

## 2023-01-06 LAB — ECHOCARDIOGRAM LIMITED
AR max vel: 0.85 cm2
AV Area VTI: 0.9 cm2
AV Area mean vel: 0.84 cm2
AV Mean grad: 24 mm[Hg]
AV Peak grad: 43.4 mm[Hg]
Ao pk vel: 3.29 m/s
S' Lateral: 3.4 cm

## 2023-01-06 MED ORDER — PERFLUTREN LIPID MICROSPHERE
1.0000 mL | INTRAVENOUS | Status: AC | PRN
  Administered 2023-01-06: 2 mL via INTRAVENOUS

## 2023-01-07 ENCOUNTER — Ambulatory Visit: Payer: Medicare HMO | Admitting: Physical Therapy

## 2023-01-07 LAB — VAS US ABI WITH/WO TBI
Left ABI: 1.04
Right ABI: 0.71

## 2023-01-11 ENCOUNTER — Ambulatory Visit (INDEPENDENT_AMBULATORY_CARE_PROVIDER_SITE_OTHER): Payer: Medicare HMO | Admitting: Vascular Surgery

## 2023-01-11 ENCOUNTER — Encounter (INDEPENDENT_AMBULATORY_CARE_PROVIDER_SITE_OTHER): Payer: Self-pay | Admitting: Vascular Surgery

## 2023-01-11 ENCOUNTER — Ambulatory Visit: Payer: Medicare HMO | Admitting: Physical Therapy

## 2023-01-11 VITALS — BP 128/66 | HR 84 | Resp 18 | Ht 71.0 in | Wt 179.2 lb

## 2023-01-11 DIAGNOSIS — I7143 Infrarenal abdominal aortic aneurysm, without rupture: Secondary | ICD-10-CM

## 2023-01-11 DIAGNOSIS — J449 Chronic obstructive pulmonary disease, unspecified: Secondary | ICD-10-CM | POA: Diagnosis not present

## 2023-01-11 DIAGNOSIS — I70211 Atherosclerosis of native arteries of extremities with intermittent claudication, right leg: Secondary | ICD-10-CM | POA: Diagnosis not present

## 2023-01-11 DIAGNOSIS — I480 Paroxysmal atrial fibrillation: Secondary | ICD-10-CM

## 2023-01-11 DIAGNOSIS — I6523 Occlusion and stenosis of bilateral carotid arteries: Secondary | ICD-10-CM | POA: Diagnosis not present

## 2023-01-11 DIAGNOSIS — I2510 Atherosclerotic heart disease of native coronary artery without angina pectoris: Secondary | ICD-10-CM | POA: Diagnosis not present

## 2023-01-11 NOTE — Progress Notes (Signed)
MRN : [id number]  Garrett Moore is a 82 y.o. ([birth year]) male who presents with chief complaint of check circulation.  History of Present Illness:  The patient returns to the office for followup and review of the noninvasive studies.    Previous angiogram 10/14/2021: The flow divider within the main body of the EVAR device is widely patent.  The left limb and left external iliac arteries are widely patent.  On the right the proximal portion of the iliac branch device is patent as is the stent leading into the internal iliac.  The internal iliac distally has extensive collaterals and is widely patent.  There is a flush occlusion of the external iliac stent.  Distally the external iliac artery is patent but moderate to severely diseased.   He is also followed for atherosclerotic occlusive disease of the lower extremities with claudication symptoms.  There have been no interval changes in lower extremity symptoms. He is now walking several miles a day and the pain that he was having is gone.  No  development of rest pain symptoms. No new ulcers or wounds have occurred since the last visit.   The patient is also followed for carotid stenosis. The carotid stenosis followed by ultrasound.   The patient denies amaurosis fugax. There is no recent history of TIA symptoms or focal motor deficits. There is no prior documented CVA.  The patient is taking enteric-coated aspirin 81 mg daily.  The patient denies amaurosis fugax or recent TIA symptoms. There are no documented recent neurological changes noted.  There have been no significant changes to the patient's overall health care.  There is no history of DVT, PE or superficial thrombophlebitis. The patient denies recent episodes of angina or shortness of breath.    Duplex ultrasound of the abdominal aorta and bilateral iliac arteries demonstrates the EVAR is patent on the left  the known occlusion of the right common iliac is again noted.  The maximal aneurysm sac diameter is 5.7 cm no change compared to last study.  No endoleak is noted within endovascular graft.  Carotid Duplex done today shows the RICA 40-59% and LICA occlusion (known).  No change compared to last study.  ABI's Rt=0.71 and Lt=1.04   Current Meds  Medication Sig   clopidogrel (PLAVIX) 75 MG tablet Take 1 tablet (75 mg total) by mouth daily.   Multiple Vitamin (MULTIVITAMIN WITH MINERALS) TABS tablet Take 1 tablet by mouth daily.   nicotine polacrilex (COMMIT) 2 MG lozenge Take 2 mg by mouth as needed for smoking cessation.   simvastatin (ZOCOR) 80 MG tablet Take 80 mg by mouth at bedtime.    Past Medical History:  Diagnosis Date   Aneurysm of infrarenal abdominal aorta (HCC) 02/08/2018   a.) TTE 02/08/2018: Ao root 38 mm, asc Ao 40 mm; b.) TTE 06/14/2020; asc Ao 28 mm; c.) CT hematuria 05/10/2020: infrarenal AAA measuring 5.9 cm; d.) s/p EVAR 07/03/2020; e.) CT A/P 10/14/2021: residual aneurysmal sac (s/p EVAR) measured 6.0 cm   Aortic atherosclerosis (HCC)    Aortic valvar stenosis 07/21/2012   a.) TTE 07/21/2012:  EF 50-55%, mild AS (AVA = 1.67, MPG 9); b.) TTE 02/08/2018: EF 60-65%; mod AS (MPG 17); c.) TTE 06/14/2020: EF 60-65%, mod AS (MPG 26.8)   Atherosclerosis of native arteries of extremity with intermittent claudication (HCC)    Basal cell carcinoma, face    BPH (benign prostatic hyperplasia)    Carotid artery stenosis 05/21/2006   a.) carotid US 05/21/2006: 0-49% BICA; b.) carotid US 06/10/2020: 1-39% RICA, total occ LICA   CKD (chronic kidney disease), stage III (HCC)    COPD (chronic obstructive pulmonary disease) (HCC)    Coronary artery disease 08/14/1983   a.) MI --> LHC 08/13/2016: CTO of the RCA --> PTCA performed resulting in 30% residual stenosis; aberrant takeoff of his RCA coming off somewhat high and posteriorly.   Diastolic dysfunction 02/08/2018   a.) TTE 02/08/2018:  EF 60-65%, mild MAC,  triv TR, mild-mod AS, G1DD; b.) TTE 06/14/2020: EF 60-65%, mod AS, triv TR, mild AR, G1DD   Diverticulosis    Family history of adverse reaction to anesthesia    a.) extended GA course for mother --> postoperaitve memory problems/dementia   GERD (gastroesophageal reflux disease)    Glaucoma    Heart murmur    History of kidney stones    Hyperlipidemia    Iliac artery aneurysm, bilateral (HCC) 07/03/2020   a.) s/p insertion of BILATERAL iliac artery stents   Myocardial infarction (HCC) 08/13/2016   a.) MI --> LHC 08/13/2016: CTO of the RCA --> PTCA performed resulting in 30% residual stenosis. Procedure complicated by procedural nausea, Patient went into IVR and then AV disaccociated rhythm. SBP dropped (80s). TVP floated with capture, which stabilized patient. ICU admission overnight.   OA (osteoarthritis)    Peripheral vascular disease (HCC)    Postoperative deep vein thrombosis (DVT) (HCC)    a.) postop age 24 following tonsillectomy (per patient report)    Past Surgical History:  Procedure Laterality Date   CATARACT EXTRACTION, BILATERAL  01/2019   CORONARY ANGIOPLASTY  08/14/1983   Procedure: CORONARY ANGIOPLASTY (PTCA pRCA); Location: [facility]; Surgeon: Al Little, MD   CYSTOSCOPY W/ RETROGRADES  08/12/2020   Procedure: CYSTOSCOPY WITH RETROGRADE PYELOGRAM;  Surgeon: Vanna Scotland, MD;  Location: [facility];  Service: Urology;;   CYSTOSCOPY WITH INSERTION OF UROLIFT N/A 12/22/2021   Procedure: CYSTOSCOPY WITH INSERTION OF UROLIFT;  Surgeon: Vanna Scotland, MD;  Location: [facility];  Service: Urology;  Laterality: N/A;   CYSTOSCOPY/URETEROSCOPY/HOLMIUM LASER/STENT PLACEMENT Left 08/12/2020   Procedure: CYSTOSCOPY/URETEROSCOPY/HOLMIUM LASER/STENT PLACEMENT;  Surgeon: Vanna Scotland, MD;  Location: [facility];  Service: Urology;  Laterality: Left;   ENDOVASCULAR REPAIR/STENT GRAFT N/A 07/03/2020   Procedure: ENDOVASCULAR REPAIR/STENT GRAFT;  Surgeon: Renford Dills, MD;  Location: [facility];  Service: Cardiovascular;  Laterality: N/A;   PELVIC ANGIOGRAPHY Right 10/14/2021   Procedure: PELVIC ANGIOGRAPHY;  Surgeon: Renford Dills, MD;  Location: [facility];  Service: Cardiovascular;  Laterality: Right;   PERIPHERAL VASCULAR THROMBECTOMY Right 10/14/2021   Procedure: PERIPHERAL VASCULAR THROMBECTOMY;  Surgeon: Renford Dills, MD;  Location: [facility];  Service: Cardiovascular;  Laterality: Right;   ROTATOR CUFF REPAIR Left    TEMPORARY PACEMAKER  08/14/1983   Procedure: TEMPORARY PACEMAKER PLACEMENT; Location: [facility]; Surgeon: Al Little, MD   TONSILLECTOMY     TOTAL HIP ARTHROPLASTY Left 02/21/2019   Procedure: TOTAL HIP ARTHROPLASTY ANTERIOR APPROACH;  Surgeon: Durene Romans, MD;  Location: [facility];  Service: Orthopedics;  Laterality: Left;  70 mins  VITRECTOMY Bilateral     Social History Social History   Tobacco Use   Smoking status: Former    Current packs/day: 0.00    Average packs/day: 2.0 packs/day for 50.0 years (100.0 ttl pk-yrs)    Types: Cigarettes    Start date: 65    Quit date: 2004    Years since quitting: 20.8   Smokeless tobacco: Never   Tobacco comments:    quit 17 years ago  Vaping Use   Vaping status: Never Used  Substance Use Topics   Alcohol use: Not Currently   Drug use: Never    Family History Family History  Problem Relation Age of Onset   Heart Problems Mother    AAA (abdominal aortic aneurysm) Mother     Allergies  Allergen Reactions   Quinolones Other (See Comments)    Other reaction(s): Has aortic root dilatation   Sulfa Antibiotics Rash     REVIEW OF SYSTEMS (Negative unless checked)  Constitutional: [] Weight loss  [] Fever  [] Chills Cardiac: [] Chest pain   [] Chest pressure   [] Palpitations   [] Shortness of breath when laying flat   [] Shortness of breath with exertion. Vascular:  [x] Pain in legs with walking   [] Pain in legs at rest  [] History  of DVT   [] Phlebitis   [] Swelling in legs   [] Varicose veins   [] Non-healing ulcers Pulmonary:   [] Uses home oxygen   [] Productive cough   [] Hemoptysis   [] Wheeze  [] COPD   [] Asthma Neurologic:  [] Dizziness   [] Seizures   [] History of stroke   [] History of TIA  [] Aphasia   [] Vissual changes   [] Weakness or numbness in arm   [] Weakness or numbness in leg Musculoskeletal:   [] Joint swelling   [] Joint pain   [] Low back pain Hematologic:  [] Easy bruising  [] Easy bleeding   [] Hypercoagulable state   [] Anemic Gastrointestinal:  [] Diarrhea   [] Vomiting  [] Gastroesophageal reflux/heartburn   [] Difficulty swallowing. Genitourinary:  [] Chronic kidney disease   [] Difficult urination  [] Frequent urination   [] Blood in urine Skin:  [] Rashes   [] Ulcers  Psychological:  [] History of anxiety   []  History of major depression.  Physical Examination  Vitals:   01/11/23 1119  BP: 128/66  Pulse: 84  Resp: 18  Weight: 179 lb 3.2 oz (81.3 kg)  Height: 5\' 11"  (1.803 m)   Body mass index is 24.99 kg/m. Gen: WD/WN, NAD Head: [place]/AT, No temporalis wasting.  Ear/Nose/Throat: Hearing grossly intact, nares w/o erythema or drainage Eyes: PER, EOMI, sclera nonicteric.  Neck: Supple, no masses.  No bruit or JVD.  Pulmonary:  Good air movement, no audible wheezing, no use of accessory muscles.  Cardiac: RRR, normal S1, S2, no Murmurs. Vascular:  mild trophic changes, no open wounds Vessel Right Left  Radial Palpable Palpable  PT Not Palpable Not Palpable  DP Not Palpable Not Palpable  Gastrointestinal: soft, non-distended. No guarding/no peritoneal signs.  Musculoskeletal: M/S 5/5 throughout.  No visible deformity.  Neurologic: CN 2-12 intact. Pain and light touch intact in extremities.  Symmetrical.  Speech is fluent. Motor exam as listed above. Psychiatric: Judgment intact, Mood & affect appropriate for pt's clinical situation. Dermatologic: No rashes or ulcers noted.  No changes consistent with  cellulitis.   CBC Lab Results  Component Value Date   WBC 9.0 05/20/2022   HGB 12.6 (L) 05/20/2022   HCT 39.2 05/20/2022   MCV 96.6 05/20/2022   PLT 262 05/20/2022    BMET    Component Value Date/Time  NA 136 05/20/2022 0357   K 3.9 05/20/2022 0357   CL 105 05/20/2022 0357   CO2 23 05/20/2022 0357   GLUCOSE 95 05/20/2022 0357   BUN 13 05/20/2022 0357   CREATININE 1.36 (H) 05/20/2022 0357   CALCIUM 8.4 (L) 05/20/2022 0357   GFRNONAA 52 (L) 05/20/2022 0357   GFRAA >60 02/22/2019 0236   CrCl cannot be calculated (Patient's most recent lab result is older than the maximum 21 days allowed.).  COAG Lab Results  Component Value Date   INR 1.1 05/19/2022   INR 1.1 06/20/2020    Radiology VAS [place] ABI WITH/WO TBI  Result Date: 01/07/2023  LOWER EXTREMITY DOPPLER STUDY Patient Name:  Garrett Moore  Date of Exam:   12/24/2022 Medical Rec #: [id number]        Accession #:    [id number] Date of Birth: July 14, 1940        Patient Gender: M Patient Age:   70 years Exam Location:  [facility] Procedure:      VAS [place] ABI WITH/WO TBI Referring Phys: Levora Dredge --------------------------------------------------------------------------------  Indications: Claudication, peripheral artery disease, and both hips rt > Lt.  Vascular Interventions: 07/03/20: EVAR with bifurcated right EIA segement & left                         EIA extender;                         Bilat CIA aneurysms with old occluded right CIA stent.                          10/14/2021: Abdominal Aortogram Left Brachial Approach.                         Selective injection of the Right and Left Lower                         Extremity third order catheter placement Left Brachial                         Approach. Right EIA Angigram Right CFA approach. Open                         Left Brachial Artery cutdown. Ultrasound-guided access                         to the Right CFA. Mynx Right CFA. Comparison Study: 12/08/2021  Performing Technologist: Debbe Bales RVS  Examination Guidelines: A complete evaluation includes at minimum, Doppler waveform signals and systolic blood pressure reading at the level of bilateral brachial, anterior tibial, and posterior tibial arteries, when vessel segments are accessible. Bilateral testing is considered an integral part of a complete examination. Photoelectric Plethysmograph (PPG) waveforms and toe systolic pressure readings are included as required and additional duplex testing as needed. Limited examinations for reoccurring indications may be performed as noted.  ABI Findings: +---------+------------------+-----+----------+--------+ Right    Rt Pressure (mmHg)IndexWaveform  Comment  +---------+------------------+-----+----------+--------+ Brachial 139                                       +---------+------------------+-----+----------+--------+ ATA  93                0.67 biphasic           +---------+------------------+-----+----------+--------+ PTA      98                0.71 monophasic         +---------+------------------+-----+----------+--------+ Great Toe81                0.58 Normal             +---------+------------------+-----+----------+--------+ +---------+------------------+-----+---------+-------+ Left     Lt Pressure (mmHg)IndexWaveform Comment +---------+------------------+-----+---------+-------+ Brachial 138                                     +---------+------------------+-----+---------+-------+ ATA      132               0.95 biphasic         +---------+------------------+-----+---------+-------+ PTA      145               1.04 triphasic        +---------+------------------+-----+---------+-------+ [place] Amy               0.81 Normal           +---------+------------------+-----+---------+-------+ +-------+-----------+-----------+------------+------------+ ABI/TBIToday's ABIToday's TBIPrevious  ABIPrevious TBI +-------+-----------+-----------+------------+------------+ Right  .71        .58        .68         .38          +-------+-----------+-----------+------------+------------+ Left   1.04       .81        1.02        .83          +-------+-----------+-----------+------------+------------+ Bilateral ABIs appear essentially unchanged compared to prior study on 12/08/2021. Left TBIs appear essentially unchanged compared to prior study on 12/08/2021. Right TBIs appear to be increased compared to prior study on 12/08/2021.  Summary: Right: Resting right ankle-brachial index indicates moderate right lower extremity arterial disease. The right toe-brachial index is abnormal. Left: Resting left ankle-brachial index is within normal range. The left toe-brachial index is normal. *See table(s) above for measurements and observations.  Electronically signed by Levora Dredge MD on 01/07/2023 at 5:52:15 PM.    Final    VAS [place] EVAR DUPLEX  Result Date: 01/07/2023 Endovascular Aortic Repair Study (EVAR) Patient Name:  Garrett Moore [facility]  Date of Exam:   12/24/2022 Medical Rec #: [id number]        Accession #:    [id number] Date of Birth: December 12, 1940        Patient Gender: M Patient Age:   89 years Exam Location:  [facility] Procedure:      VAS [place] EVAR DUPLEX Referring Phys: Sheppard Plumber --------------------------------------------------------------------------------  Indications: Follow up exam for EVAR. Vascular Interventions: 07/03/20: EVAR with bifurcated right EIA segement & left                         EIA extender;                         Bilat CIA aneurysms with old occluded right CIA stent.  Comparison Study: 02/04/2022 Performing Technologist: Debbe Bales RVS  Examination Guidelines: A complete evaluation includes B-mode imaging, spectral Doppler, color Doppler, and power  Doppler as needed of all accessible portions of each vessel. Bilateral testing is considered an integral part  of a complete examination. Limited examinations for reoccurring indications may be performed as noted.  Abdominal Aorta Findings: +--------+-------+----------+----------+----------+--------+--------+ LocationAP (cm)Trans (cm)PSV (cm/s)Waveform  ThrombusComments +--------+-------+----------+----------+----------+--------+--------+ Proximal2.53   2.52      30        monophasic                 +--------+-------+----------+----------+----------+--------+--------+ Mid     2.22   2.28      46        monophasic                 +--------+-------+----------+----------+----------+--------+--------+ Endovascular Aortic Repair (EVAR): +----------+----------------+-------------------+-------------------+           Diameter AP (cm)Diameter Trans (cm)Velocities (cm/sec) +----------+----------------+-------------------+-------------------+ Aorta     5.03            5.70               36                  +----------+----------------+-------------------+-------------------+ Right Limb1.25            1.39               32                  +----------+----------------+-------------------+-------------------+ Left Limb 1.26            1.50               39                  +----------+----------------+-------------------+-------------------+  Summary: Abdominal Aorta: Patent endovascular aneurysm repair with no evidence of endoleak. The EVAR appears to be essentially unchanged compared to prior study on 12/08/2021.  *See table(s) above for measurements and observations.  Electronically signed by Levora Dredge MD on 01/07/2023 at 5:52:08 PM.    Final    VAS US CAROTID  Result Date: 01/07/2023 Carotid Arterial Duplex Study Patient Name:  XZANDER GILHAM [facility]  Date of Exam:   12/24/2022 Medical Rec #: [id number]        Accession #:    [id number] Date of Birth: 04/11/1940        Patient Gender: M Patient Age:   66 years Exam Location:  [facility] Procedure:      VAS US CAROTID  Referring Phys: Sheppard Plumber --------------------------------------------------------------------------------  Indications:       Carotid artery disease. Comparison Study:  05/20/2022: Lt ICA Occlusion Performing Technologist: Debbe Bales RVS  Examination Guidelines: A complete evaluation includes B-mode imaging, spectral Doppler, color Doppler, and power Doppler as needed of all accessible portions of each vessel. Bilateral testing is considered an integral part of a complete examination. Limited examinations for reoccurring indications may be performed as noted.  Right Carotid Findings: +---------+--------+-------+--------+---------------------------------+--------+          PSV cm/sEDV    StenosisPlaque Description               Comments                  cm/s                                                     +---------+--------+-------+--------+---------------------------------+--------+ CCA  Prox 100     26                                                       +---------+--------+-------+--------+---------------------------------+--------+ CCA Mid  99      22             heterogenous, irregular and                                               calcific                                  +---------+--------+-------+--------+---------------------------------+--------+ CCA      114     20             heterogenous, irregular and               Distal                          calcific                                  +---------+--------+-------+--------+---------------------------------+--------+ ICA Prox 130     32             calcific                                  +---------+--------+-------+--------+---------------------------------+--------+ ICA Mid  141     39                                                       +---------+--------+-------+--------+---------------------------------+--------+ ICA      120     30                                                        Distal                                                                    +---------+--------+-------+--------+---------------------------------+--------+ ECA      178     0                                                        +---------+--------+-------+--------+---------------------------------+--------+ +----------+--------+-------+--------+-------------------+  PSV cm/sEDV cmsDescribeArm Pressure (mmHG) +----------+--------+-------+--------+-------------------+ Subclavian89      0                                  +----------+--------+-------+--------+-------------------+ +---------+--------+--+--------+--+ VertebralPSV cm/s53EDV cm/s11 +---------+--------+--+--------+--+  Left Carotid Findings: +----------+--------+--------+--------+------------------+--------+           PSV cm/sEDV cm/sStenosisPlaque DescriptionComments +----------+--------+--------+--------+------------------+--------+ CCA Prox  74      0                                          +----------+--------+--------+--------+------------------+--------+ CCA Mid   40      0                                          +----------+--------+--------+--------+------------------+--------+ CCA Distal35      0                                          +----------+--------+--------+--------+------------------+--------+ ICA Prox  0       0       Occluded                           +----------+--------+--------+--------+------------------+--------+ ICA Mid   0       0       Occluded                           +----------+--------+--------+--------+------------------+--------+ ICA Distal0       0       Occluded                           +----------+--------+--------+--------+------------------+--------+ ECA       134     0                                          +----------+--------+--------+--------+------------------+--------+  +----------+--------+--------+--------+-------------------+           PSV cm/sEDV cm/sDescribeArm Pressure (mmHG) +----------+--------+--------+--------+-------------------+ Subclavian185     0                                   +----------+--------+--------+--------+-------------------+ +---------+--------+--+--------+--+ VertebralPSV cm/s82EDV cm/s10 +---------+--------+--+--------+--+   Summary: Right Carotid: Velocities in the right ICA are consistent with a 40-59%                stenosis. Left Carotid: Evidence consistent with a total occlusion of the left ICA. Vertebrals:  Bilateral vertebral arteries demonstrate antegrade flow. Subclavians: Normal flow hemodynamics were seen in bilateral subclavian              arteries. *See table(s) above for measurements and observations.  Electronically signed by Levora Dredge MD on 01/07/2023 at 5:52:02 PM.    Final    ECHOCARDIOGRAM LIMITED  Result Date: 01/06/2023    ECHOCARDIOGRAM LIMITED REPORT   Patient Name:   Garrett Moore  [facility] Date of Exam: 01/06/2023 Medical Rec #:  [id number]       Height:       71.0 in Accession #:    [id number]      Weight:       176.4 lb Date of Birth:  04/24/1940       BSA:          1.999 m Patient Age:    82 years        BP:           132/74 mmHg Patient Gender: M               HR:           73 bpm. Exam Location:  [facility] Procedure: Limited Echo, Limited Color Doppler, Cardiac Doppler and Intracardiac            Opacification Agent Indications:    I35.0 Nonrheumatic aortic (valve) stenosis  History:        Patient has prior history of Echocardiogram examinations, most                 recent 07/06/2022. CHF, TIA and COPD, Aortic Valve Disease,                 Arrythmias:Atrial Fibrillation, Signs/Symptoms:Murmur; Risk                 Factors:Family History of Coronary Artery Disease, Dyslipidemia                 and Former Smoker.  Sonographer:    Quentin Ore RDMS, RVT, RDCS Sonographer#2:  Ilda Mori MHA, BS,  RDCS Referring Phys: [id number] [facility]  Sonographer Comments: Limited TTE to reassess AS. LVEF also obtained IMPRESSIONS  1. Left ventricular ejection fraction, by estimation, is 60 to 65%. The left ventricle has normal function. The left ventricle has no regional wall motion abnormalities.  2. Right ventricular systolic function is normal. The right ventricular size is normal.  3. The mitral valve is normal in structure. Mild mitral valve regurgitation. No evidence of mitral stenosis.  4. The aortic valve is normal in structure. There is severe calcifcation of the aortic valve. Aortic valve regurgitation is mild. Moderate to severe aortic valve stenosis. Aortic valve area, by VTI measures 0.90 cm. Aortic valve mean gradient measures 24.0 mmHg. Aortic valve Vmax measures 3.29 m/s.  5. The inferior vena cava is normal in size with greater than 50% respiratory variability, suggesting right atrial pressure of 3 mmHg. Comparison(s): Previous AV meas: max, 29 mean. FINDINGS  Left Ventricle: Left ventricular ejection fraction, by estimation, is 60 to 65%. The left ventricle has normal function. The left ventricle has no regional wall motion abnormalities. Definity contrast agent was given IV to delineate the left ventricular  endocardial borders. The left ventricular internal cavity size was normal in size. There is no left ventricular hypertrophy. Right Ventricle: The right ventricular size is normal. No increase in right ventricular wall thickness. Right ventricular systolic function is normal. Left Atrium: Left atrial size was normal in size. Right Atrium: Right atrial size was normal in size. Pericardium: There is no evidence of pericardial effusion. Mitral Valve: The mitral valve is normal in structure. Mild mitral valve regurgitation. No evidence of mitral valve stenosis. Tricuspid Valve: The tricuspid valve is normal in structure. Tricuspid valve regurgitation is not demonstrated. No evidence of tricuspid  stenosis. Aortic Valve: The aortic valve is normal in structure. There is severe calcifcation of  the aortic valve. Aortic valve regurgitation is mild. Moderate to severe aortic stenosis is present. Aortic valve mean gradient measures 24.0 mmHg. Aortic valve peak gradient measures 43.4 mmHg. Aortic valve area, by VTI measures 0.90 cm. Pulmonic Valve: The pulmonic valve was normal in structure. Pulmonic valve regurgitation is not visualized. No evidence of pulmonic stenosis. Aorta: The aortic root is normal in size and structure. Venous: The inferior vena cava is normal in size with greater than 50% respiratory variability, suggesting right atrial pressure of 3 mmHg. IAS/Shunts: No atrial level shunt detected by color flow Doppler. LEFT VENTRICLE PLAX 2D LVIDd:         4.80 cm LVIDs:         3.40 cm LV PW:         0.90 cm LV IVS:        0.90 cm LVOT diam:     2.10 cm LV SV:         58 LV SV Index:   29 LVOT Area:     3.46 cm  AORTIC VALVE AV Area (Vmax):    0.85 cm AV Area (Vmean):   0.84 cm AV Area (VTI):     0.90 cm AV Vmax:           329.33 cm/s AV Vmean:          213.800 cm/s AV VTI:            0.646 m AV Peak Grad:      43.4 mmHg AV Mean Grad:      24.0 mmHg LVOT Vmax:         81.10 cm/s LVOT Vmean:        51.800 cm/s LVOT VTI:          0.167 m LVOT/AV VTI ratio: 0.26  SHUNTS Systemic VTI:  0.17 m Systemic Diam: 2.10 cm Garrett Nordmann MD Electronically signed by Garrett Nordmann MD Signature Date/Time: 01/06/2023/6:35:36 PM    Final      Assessment/Plan 1. Infrarenal abdominal aortic aneurysm (AAA) without rupture (HCC) Recommend:  Patient is status post successful endovascular repair of the AAA.    No further intervention is required at this time.   No endoleak is detected and the aneurysm sac is stable.   The patient will continue antiplatelet therapy as prescribed as well as aggressive management of hyperlipidemia. Exercise is again strongly encouraged.    However, endografts require continued  surveillance with ultrasound or CT scan. This is mandatory to detect any changes that allow repressurization of the aneurysm sac.  The patient is informed that this would be asymptomatic.   The patient is reminded that lifelong routine surveillance is a necessity with an endograft. Patient will continue to follow-up at the specified interval with ultrasound of the aorta.  - VAS US AORTA/IVC/ILIACS; Future  2. Bilateral carotid artery stenosis Recommend:  Given the patient's asymptomatic subcritical stenosis no further invasive testing or surgery at this time.  Duplex ultrasound shows RICA 40-59% and LICA occlusion (known) stenosis bilaterally.  Continue antiplatelet therapy as prescribed Continue management of CAD, HTN and Hyperlipidemia Healthy heart diet,  encouraged exercise at least 4 times per week  Follow up in 12 months with duplex ultrasound and physical exam   3. Atherosclerosis of native artery of right lower extremity with intermittent claudication (HCC)  Recommend:   The patient has evidence of atherosclerosis of the lower extremities with claudication.  The patient does not voice lifestyle limiting changes at this point in  time.  In fact he has had significant improvement in his right leg symptoms   Noninvasive studies do not suggest clinically significant change.   No invasive studies, angiography or surgery at this time The patient should continue walking and begin a more formal exercise program.  The patient should continue antiplatelet therapy and aggressive treatment of the lipid abnormalities   No changes in the patient's medications at this time   Continued surveillance is indicated as atherosclerosis is likely to progress with time.     The patient will continue follow up with noninvasive studies as ordered.    4. Atherosclerosis of native coronary artery of native heart without angina pectoris Continue cardiac and antihypertensive medications as already  ordered and reviewed, no changes at this time.  Continue statin as ordered and reviewed, no changes at this time  Nitrates PRN for chest pain  5. Paroxysmal atrial fibrillation (HCC) Continue antiarrhythmia medications as already ordered, these medications have been reviewed and there are no changes at this time.  Continue anticoagulation as ordered by Cardiology Service  6. Chronic obstructive pulmonary disease, unspecified COPD type (HCC) Continue pulmonary medications and aerosols as already ordered, these medications have been reviewed and there are no changes at this time.     Levora Dredge, MD  01/11/2023 11:48 AM

## 2023-01-14 ENCOUNTER — Ambulatory Visit: Payer: Medicare HMO | Admitting: Physical Therapy

## 2023-01-15 DIAGNOSIS — M25551 Pain in right hip: Secondary | ICD-10-CM | POA: Diagnosis not present

## 2023-01-15 DIAGNOSIS — Z96642 Presence of left artificial hip joint: Secondary | ICD-10-CM | POA: Diagnosis not present

## 2023-01-15 DIAGNOSIS — M1611 Unilateral primary osteoarthritis, right hip: Secondary | ICD-10-CM | POA: Diagnosis not present

## 2023-01-22 ENCOUNTER — Ambulatory Visit: Payer: Medicare HMO | Admitting: Physician Assistant

## 2023-01-22 ENCOUNTER — Encounter: Payer: Self-pay | Admitting: Physician Assistant

## 2023-01-22 VITALS — BP 113/68 | HR 87

## 2023-01-22 DIAGNOSIS — N400 Enlarged prostate without lower urinary tract symptoms: Secondary | ICD-10-CM | POA: Diagnosis not present

## 2023-01-22 LAB — BLADDER SCAN AMB NON-IMAGING: Scan Result: 24

## 2023-01-22 NOTE — Progress Notes (Signed)
01/22/2023 3:23 PM   Garrett Moore [birth year] [id number]  CC: Chief Complaint  Patient presents with   Benign Prostatic Hypertrophy   HPI: Garrett Moore is a 82 y.o. male with PMH nephrolithiasis and BPH s/p UroLift in 2023 who presents today for annual follow-up.   Today he reports continues to do extremely well from a urinary standpoint with no acute concerns.  He feels the symptoms are rather stable and is very pleased.  IPSS 4/delighted as below.  PVR 24 mL.   IPSS     Row Name 01/22/23 1500         International Prostate Symptom Score   How often have you had the sensation of not emptying your bladder? Less than 1 in 5     How often have you had to urinate less than every two hours? Less than 1 in 5 times     How often have you found you stopped and started again several times when you urinated? Not at All     How often have you found it difficult to postpone urination? Less than 1 in 5 times     How often have you had a weak urinary stream? Not at All     How often have you had to strain to start urination? Not at All     How many times did you typically get up at night to urinate? 1 Time     Total IPSS Score 4       Quality of Life due to urinary symptoms   If you were to spend the rest of your life with your urinary condition just the way it is now how would you feel about that? Delighted               PMH: Past Medical History:  Diagnosis Date   Aneurysm of infrarenal abdominal aorta (HCC) 02/08/2018   a.) TTE 02/08/2018: Ao root 38 mm, asc Ao 40 mm; b.) TTE 06/14/2020; asc Ao 28 mm; c.) CT hematuria 05/10/2020: infrarenal AAA measuring 5.9 cm; d.) s/p EVAR 07/03/2020; e.) CT A/P 10/14/2021: residual aneurysmal sac (s/p EVAR) measured 6.0 cm   Aortic atherosclerosis (HCC)    Aortic valvar stenosis 07/21/2012   a.) TTE 07/21/2012: EF 50-55%, mild AS (AVA = 1.67, MPG 9); b.) TTE 02/08/2018: EF 60-65%; mod AS (MPG 17); c.) TTE 06/14/2020: EF 60-65%, mod  AS (MPG 26.8)   Atherosclerosis of native arteries of extremity with intermittent claudication (HCC)    Basal cell carcinoma, face    BPH (benign prostatic hyperplasia)    Carotid artery stenosis 05/21/2006   a.) carotid US 05/21/2006: 0-49% BICA; b.) carotid US 06/10/2020: 1-39% RICA, total occ LICA   CKD (chronic kidney disease), stage III (HCC)    COPD (chronic obstructive pulmonary disease) (HCC)    Coronary artery disease 08/14/1983   a.) MI --> LHC 08/13/2016: CTO of the RCA --> PTCA performed resulting in 30% residual stenosis; aberrant takeoff of his RCA coming off somewhat high and posteriorly.   Diastolic dysfunction 02/08/2018   a.) TTE 02/08/2018: EF 60-65%, mild MAC,  triv TR, mild-mod AS, G1DD; b.) TTE 06/14/2020: EF 60-65%, mod AS, triv TR, mild AR, G1DD   Diverticulosis    Family history of adverse reaction to anesthesia    a.) extended GA course for mother --> postoperaitve memory problems/dementia   GERD (gastroesophageal reflux disease)    Glaucoma    Heart murmur    History of kidney  stones    Hyperlipidemia    Iliac artery aneurysm, bilateral (HCC) 07/03/2020   a.) s/p insertion of BILATERAL iliac artery stents   Myocardial infarction (HCC) 08/13/2016   a.) MI --> LHC 08/13/2016: CTO of the RCA --> PTCA performed resulting in 30% residual stenosis. Procedure complicated by procedural nausea, Patient went into IVR and then AV disaccociated rhythm. SBP dropped (80s). TVP floated with capture, which stabilized patient. ICU admission overnight.   OA (osteoarthritis)    Peripheral vascular disease (HCC)    Postoperative deep vein thrombosis (DVT) (HCC)    a.) postop age 56 following tonsillectomy (per patient report)    Surgical History: Past Surgical History:  Procedure Laterality Date   CATARACT EXTRACTION, BILATERAL  01/2019   CORONARY ANGIOPLASTY  08/14/1983   Procedure: CORONARY ANGIOPLASTY (PTCA pRCA); Location: [facility]; Surgeon: Al Little, MD   CYSTOSCOPY  W/ RETROGRADES  08/12/2020   Procedure: CYSTOSCOPY WITH RETROGRADE PYELOGRAM;  Surgeon: Vanna Scotland, MD;  Location: [facility];  Service: Urology;;   CYSTOSCOPY WITH INSERTION OF UROLIFT N/A 12/22/2021   Procedure: CYSTOSCOPY WITH INSERTION OF UROLIFT;  Surgeon: Vanna Scotland, MD;  Location: [facility];  Service: Urology;  Laterality: N/A;   CYSTOSCOPY/URETEROSCOPY/HOLMIUM LASER/STENT PLACEMENT Left 08/12/2020   Procedure: CYSTOSCOPY/URETEROSCOPY/HOLMIUM LASER/STENT PLACEMENT;  Surgeon: Vanna Scotland, MD;  Location: [facility];  Service: Urology;  Laterality: Left;   ENDOVASCULAR REPAIR/STENT GRAFT N/A 07/03/2020   Procedure: ENDOVASCULAR REPAIR/STENT GRAFT;  Surgeon: Renford Dills, MD;  Location: [facility];  Service: Cardiovascular;  Laterality: N/A;   PELVIC ANGIOGRAPHY Right 10/14/2021   Procedure: PELVIC ANGIOGRAPHY;  Surgeon: Renford Dills, MD;  Location: [facility];  Service: Cardiovascular;  Laterality: Right;   PERIPHERAL VASCULAR THROMBECTOMY Right 10/14/2021   Procedure: PERIPHERAL VASCULAR THROMBECTOMY;  Surgeon: Renford Dills, MD;  Location: [facility];  Service: Cardiovascular;  Laterality: Right;   ROTATOR CUFF REPAIR Left    TEMPORARY PACEMAKER  08/14/1983   Procedure: TEMPORARY PACEMAKER PLACEMENT; Location: [facility]; Surgeon: Al Little, MD   TONSILLECTOMY     TOTAL HIP ARTHROPLASTY Left 02/21/2019   Procedure: TOTAL HIP ARTHROPLASTY ANTERIOR APPROACH;  Surgeon: Durene Romans, MD;  Location: [facility];  Service: Orthopedics;  Laterality: Left;  70 mins   VITRECTOMY Bilateral     Home Medications:  Allergies as of 01/22/2023       Reactions   Quinolones Other (See Comments)   Other reaction(s): Has aortic root dilatation   Sulfa Antibiotics Rash        Medication List        Accurate as of January 22, 2023  3:23 PM. If you have any questions, ask your nurse or doctor.          clopidogrel 75 MG tablet Commonly  known as: PLAVIX Take 1 tablet (75 mg total) by mouth daily.   latanoprost 0.005 % ophthalmic solution Commonly known as: XALATAN SMARTSIG:In Eye(s)   multivitamin with minerals Tabs tablet Take 1 tablet by mouth daily.   nicotine polacrilex 2 MG lozenge Commonly known as: COMMIT Take 2 mg by mouth as needed for smoking cessation.   simvastatin 80 MG tablet Commonly known as: ZOCOR Take 80 mg by mouth at bedtime.   traMADol 50 MG tablet Commonly known as: ULTRAM Take 50 mg by mouth daily as needed.        Allergies:  Allergies  Allergen Reactions   Quinolones Other (See Comments)    Other reaction(s): Has aortic  root dilatation   Sulfa Antibiotics Rash    Family History: Family History  Problem Relation Age of Onset   Heart Problems Mother    AAA (abdominal aortic aneurysm) Mother     Social History:   reports that he quit smoking about 20 years ago. His smoking use included cigarettes. He started smoking about 70 years ago. He has a 100 pack-year smoking history. He has never used smokeless tobacco. He reports that he does not currently use alcohol. He reports that he does not use drugs.  Physical Exam: BP 113/68   Pulse 87   Constitutional:  Alert and oriented, no acute distress, nontoxic appearing HEENT: [place], AT Cardiovascular: No clubbing, cyanosis, or edema Respiratory: Normal respiratory effort, no increased work of breathing Skin: No rashes, bruises or suspicious lesions Neurologic: Grossly intact, no focal deficits, moving all 4 extremities Psychiatric: Normal mood and affect  Laboratory Data: Results for orders placed or performed in visit on 01/22/23  Bladder Scan (Post Void Residual) in office  Result Value Ref Range   Scan Result 24 ml    Assessment & Plan:   1. Benign prostatic hyperplasia without lower urinary tract symptoms Durable UroLift results with excellent IPSS and PVR.  He is very pleased.  Okay to follow-up with [place] as needed. -  Bladder Scan (Post Void Residual) in office  Return if symptoms worsen or fail to improve.  Carman Ching, PA-C  [facility] [place] [address], [address] [place], [place] [address] [phone]

## 2023-01-24 ENCOUNTER — Encounter (INDEPENDENT_AMBULATORY_CARE_PROVIDER_SITE_OTHER): Payer: Self-pay | Admitting: Vascular Surgery

## 2023-01-24 DIAGNOSIS — I6529 Occlusion and stenosis of unspecified carotid artery: Secondary | ICD-10-CM | POA: Insufficient documentation

## 2023-01-26 ENCOUNTER — Telehealth: Payer: Self-pay | Admitting: Cardiovascular Disease

## 2023-01-26 NOTE — Telephone Encounter (Signed)
   Pre-operative Risk Assessment    Patient Name: Garrett Moore  DOB: November 29, 1940 MRN: [id number]      Request for Surgical Clearance    Procedure:   right total hip arthoplasty  Date of Surgery:  Clearance 03/09/23                                 Surgeon:  Dr Durene Romans Surgeon's Group or Practice Name:  [facility] Phone number:  [phone] Fax number:  [phone]   Type of Clearance Requested:   - Medical    Type of Anesthesia:  Spinal   Additional requests/questions:    SignedNorman Herrlich   01/26/2023, 8:37 AM

## 2023-01-26 NOTE — Telephone Encounter (Signed)
   Name: Garrett Moore  DOB: [birth year]  MRN: [id number]  Primary Cardiologist: Julien Nordmann, MD  Chart reviewed as part of pre-operative protocol coverage. Because of Garrett Moore's past medical history and time since last visit, he will require a follow-up in-office visit in order to better assess preoperative cardiovascular risk.  Patient has an office visit scheduled on 02/08/2023 with Dr. Mariah Milling. Appointment notes have been updated to reflect need for pre-op evaluation.   Pre-op covering staff:  - Please contact requesting surgeon's office via preferred method (i.e, phone, fax) to inform them of need for appointment prior to surgery.   Carlos Levering, NP  01/26/2023, 5:01 PM

## 2023-02-03 ENCOUNTER — Ambulatory Visit: Payer: Medicare HMO | Admitting: Urology

## 2023-02-03 ENCOUNTER — Ambulatory Visit: Payer: Medicare HMO | Admitting: Physician Assistant

## 2023-02-07 NOTE — Progress Notes (Unsigned)
Cardiology Office Note  Date:  02/08/2023   ID:  Brandell, Sakai December 20, 1940, MRN [id number]  PCP:  Daisy Floro, MD   Chief Complaint  Patient presents with   6 month follow up     Pre-op evaluation for right hip surgery that is scheduled for Dec. 18, 2024 with Dr. Cornelius Moras. Medications reviewed by the patient verbally. "Doing well."     HPI:  Mr. Claxton Lyu is a 82 year-old gentleman with past medical history of Peripheral arterial disease, AAA, endograft repair CAD, PTCA of RCA (sx of fatigue) 1985 Aortic valve stenosis, moderate Total occlusion left carotid, mild to moderate disease on right Presents for f/u of his PAD, AAA repair,March 2022, CAD, moderate AS  Last seen by myself in clinic March 2023  Echocardiogram January 06, 2023 Normal ejection fraction 60% Moderate aortic valve stenosis, numbers appear relatively unchanged compared to prior echocardiogram April 2024 and March 2022  No chest pain, no angina No SOB Reports typically walks on a regular basis, activity limited recently secondary to right hip pain  Labs reviewed: Total chol 115, LDL 60  CR 1.36 HGB 12.6  EKG personally reviewed by myself on todays visit EKG Interpretation Date/Time:  Monday February 08 2023 10:03:48 EST Ventricular Rate:  67 PR Interval:  198 QRS Duration:  90 QT Interval:  402 QTC Calculation: 424 R Axis:   45  Text Interpretation: Normal sinus rhythm Nonspecific T wave abnormality When compared with ECG of 19-May-2022 17:09, No significant change was found Confirmed by Julien Nordmann [phone]) on 02/08/2023 10:13:37 AM    Past medical history reviewed catheterization 1985  Angioplasty to RCA  Previously seen by cardiology in 2014 Echocardiogram at that time with mild aortic valve stenosis at that time, mild dilation of aortic root and ascending aorta   PMH:   has a past medical history of Aneurysm of infrarenal abdominal aorta (HCC) (02/08/2018), Aortic  atherosclerosis (HCC), Aortic valvar stenosis (07/21/2012), Atherosclerosis of native arteries of extremity with intermittent claudication (HCC), Basal cell carcinoma, face, BPH (benign prostatic hyperplasia), Carotid artery stenosis (05/21/2006), CKD (chronic kidney disease), stage III (HCC), COPD (chronic obstructive pulmonary disease) (HCC), Coronary artery disease (08/14/1983), Diastolic dysfunction (02/08/2018), Diverticulosis, Family history of adverse reaction to anesthesia, GERD (gastroesophageal reflux disease), Glaucoma, Heart murmur, History of kidney stones, Hyperlipidemia, Iliac artery aneurysm, bilateral (HCC) (07/03/2020), Myocardial infarction (HCC) (08/13/2016), OA (osteoarthritis), Peripheral vascular disease (HCC), and Postoperative deep vein thrombosis (DVT) (HCC).  PSH:    Past Surgical History:  Procedure Laterality Date   CATARACT EXTRACTION, BILATERAL  01/2019   CORONARY ANGIOPLASTY  08/14/1983   Procedure: CORONARY ANGIOPLASTY (PTCA pRCA); Location: [facility]; Surgeon: Al Little, MD   CYSTOSCOPY W/ RETROGRADES  08/12/2020   Procedure: CYSTOSCOPY WITH RETROGRADE PYELOGRAM;  Surgeon: Vanna Scotland, MD;  Location: [facility];  Service: Urology;;   CYSTOSCOPY WITH INSERTION OF UROLIFT N/A 12/22/2021   Procedure: CYSTOSCOPY WITH INSERTION OF UROLIFT;  Surgeon: Vanna Scotland, MD;  Location: [facility];  Service: Urology;  Laterality: N/A;   CYSTOSCOPY/URETEROSCOPY/HOLMIUM LASER/STENT PLACEMENT Left 08/12/2020   Procedure: CYSTOSCOPY/URETEROSCOPY/HOLMIUM LASER/STENT PLACEMENT;  Surgeon: Vanna Scotland, MD;  Location: [facility];  Service: Urology;  Laterality: Left;   ENDOVASCULAR REPAIR/STENT GRAFT N/A 07/03/2020   Procedure: ENDOVASCULAR REPAIR/STENT GRAFT;  Surgeon: Renford Dills, MD;  Location: [facility];  Service: Cardiovascular;  Laterality: N/A;   PELVIC ANGIOGRAPHY Right 10/14/2021   Procedure: PELVIC ANGIOGRAPHY;  Surgeon: Renford Dills, MD;  Location:  [facility]  [facility];  Service: Cardiovascular;  Laterality: Right;   PERIPHERAL VASCULAR THROMBECTOMY Right 10/14/2021   Procedure: PERIPHERAL VASCULAR THROMBECTOMY;  Surgeon: Renford Dills, MD;  Location: [facility];  Service: Cardiovascular;  Laterality: Right;   ROTATOR CUFF REPAIR Left    TEMPORARY PACEMAKER  08/14/1983   Procedure: TEMPORARY PACEMAKER PLACEMENT; Location: [facility]; Surgeon: Al Little, MD   TONSILLECTOMY     TOTAL HIP ARTHROPLASTY Left 02/21/2019   Procedure: TOTAL HIP ARTHROPLASTY ANTERIOR APPROACH;  Surgeon: Durene Romans, MD;  Location: [facility];  Service: Orthopedics;  Laterality: Left;  70 mins   VITRECTOMY Bilateral     Current Outpatient Medications  Medication Sig Dispense Refill   clopidogrel (PLAVIX) 75 MG tablet Take 1 tablet (75 mg total) by mouth daily. 90 tablet 3   latanoprost (XALATAN) 0.005 % ophthalmic solution SMARTSIG:In Eye(s)     Multiple Vitamin (MULTIVITAMIN WITH MINERALS) TABS tablet Take 1 tablet by mouth daily.     nicotine polacrilex (COMMIT) 2 MG lozenge Take 2 mg by mouth as needed for smoking cessation.     simvastatin (ZOCOR) 80 MG tablet Take 80 mg by mouth at bedtime.     traMADol (ULTRAM) 50 MG tablet Take 50 mg by mouth daily as needed.     No current facility-administered medications for this visit.     Allergies:   Quinolones and Sulfa antibiotics   Social History:  The patient  reports that he quit smoking about 20 years ago. His smoking use included cigarettes. He started smoking about 70 years ago. He has a 100 pack-year smoking history. He has never used smokeless tobacco. He reports that he does not currently use alcohol. He reports that he does not use drugs.   Family History:   family history includes AAA (abdominal aortic aneurysm) in his mother; Heart Problems in his mother.    Review of Systems: Review of Systems  Constitutional: Negative.   HENT: Negative.    Respiratory: Negative.     Cardiovascular: Negative.   Gastrointestinal: Negative.   Musculoskeletal: Negative.   Neurological: Negative.   Psychiatric/Behavioral: Negative.    All other systems reviewed and are negative.   PHYSICAL EXAM: VS:  BP (!) 122/50 (BP Location: Left Arm, Patient Position: Sitting, Cuff Size: Normal)   Pulse 67   Ht 5\' 11"  (1.803 m)   Wt 176 lb 4 oz (79.9 kg)   SpO2 96%   BMI 24.58 kg/m  , BMI Body mass index is 24.58 kg/m. Constitutional:  oriented to person, place, and time. No distress.  HENT:  Head: Grossly normal Eyes:  no discharge. No scleral icterus.  Neck: No JVD, no carotid bruits  Cardiovascular: Regular rate and rhythm, no murmurs appreciated Pulmonary/Chest: Clear to auscultation bilaterally, no wheezes or rails Abdominal: Soft.  no distension.  no tenderness.  Musculoskeletal: Normal range of motion Neurological:  normal muscle tone. Coordination normal. No atrophy Skin: Skin warm and dry Psychiatric: normal affect, pleasant  Recent Labs: 05/20/2022: ALT 22; BUN 13; Creatinine, Ser 1.36; Hemoglobin 12.6; Magnesium 2.1; Platelets 262; Potassium 3.9; Sodium 136    Lipid Panel Lab Results  Component Value Date   CHOL 115 05/20/2022   HDL 36 (L) 05/20/2022   LDLCALC 60 05/20/2022   TRIG 96 05/20/2022      Wt Readings from Last 3 Encounters:  02/08/23 176 lb 4 oz (79.9 kg)  01/11/23 179 lb 3.2 oz (81.3 kg)  12/30/22 176 lb 6.4 oz (80 kg)  ASSESSMENT AND PLAN:  Problem List Items Addressed This Visit       Cardiology Problems   Carotid stenosis   Hyperlipidemia   Moderate aortic stenosis   Relevant Orders   EKG 12-Lead (Completed)   AAA (abdominal aortic aneurysm) (HCC)   Relevant Orders   EKG 12-Lead (Completed)   AAA (abdominal aortic aneurysm) without rupture (HCC)   Relevant Orders   EKG 12-Lead (Completed)   Other Visit Diagnoses     Coronary artery disease involving native coronary artery of native heart without angina pectoris     -  Primary   Relevant Orders   EKG 12-Lead (Completed)   Aortic valve stenosis, etiology of cardiac valve disease unspecified       Relevant Orders   EKG 12-Lead (Completed)   PSVT (paroxysmal supraventricular tachycardia) (HCC)       Relevant Orders   EKG 12-Lead (Completed)   Carotid artery disease, unspecified laterality, unspecified type (HCC)       Relevant Orders   EKG 12-Lead (Completed)   PAD (peripheral artery disease) (HCC)       Relevant Orders   EKG 12-Lead (Completed)   Hyperlipidemia LDL goal <70       Coronary artery disease involving native coronary artery of native heart with angina pectoris (HCC)       Relevant Orders   EKG 12-Lead (Completed)      Preop cardiovascular evaluation hip surgery Acceptable risk for hip surgery, no further cardiac testing needed  AAA endovascular repair with Dr. Aline August ultrasound October 2024  Aortic valve stenosis Moderate on echocardiogram 2022, no significant change in 2024 Asymptomatic, low-grade murmur on exam Up until hip pain was actively walking with no chest pain or shortness of breath symptoms  PAD/carotid stenosis Mild to moderate disease noted 2014 40 to 59% stenosis on right in October 2024 Occlusion on left, asymptomatic Cholesterol well-controlled  Hyperlipidemia Cholesterol is at goal on the current lipid regimen. No changes to the medications were made.  CAD with stable angina Prior PTCA 1980s, no intervention since that time Currently with no symptoms of angina. No further workup at this time. Continue current medication regimen.  Dilated aorta Not grossly dilated on echocardiogram in 2024    Signed, Dossie Arbour, M.D., Ph.D. [facility] [place], [place] [phone]

## 2023-02-08 ENCOUNTER — Encounter: Payer: Self-pay | Admitting: Cardiovascular Disease

## 2023-02-08 ENCOUNTER — Ambulatory Visit (INDEPENDENT_AMBULATORY_CARE_PROVIDER_SITE_OTHER): Payer: Medicare HMO | Admitting: Vascular Surgery

## 2023-02-08 ENCOUNTER — Other Ambulatory Visit (INDEPENDENT_AMBULATORY_CARE_PROVIDER_SITE_OTHER): Payer: Medicare HMO

## 2023-02-08 ENCOUNTER — Encounter (INDEPENDENT_AMBULATORY_CARE_PROVIDER_SITE_OTHER): Payer: Medicare HMO

## 2023-02-08 ENCOUNTER — Ambulatory Visit: Payer: Medicare HMO | Attending: Cardiovascular Disease | Admitting: Cardiovascular Disease

## 2023-02-08 VITALS — BP 122/50 | HR 67 | Ht 71.0 in | Wt 176.2 lb

## 2023-02-08 DIAGNOSIS — I350 Nonrheumatic aortic (valve) stenosis: Secondary | ICD-10-CM | POA: Diagnosis not present

## 2023-02-08 DIAGNOSIS — E782 Mixed hyperlipidemia: Secondary | ICD-10-CM

## 2023-02-08 DIAGNOSIS — I471 Supraventricular tachycardia, unspecified: Secondary | ICD-10-CM

## 2023-02-08 DIAGNOSIS — E785 Hyperlipidemia, unspecified: Secondary | ICD-10-CM

## 2023-02-08 DIAGNOSIS — I779 Disorder of arteries and arterioles, unspecified: Secondary | ICD-10-CM

## 2023-02-08 DIAGNOSIS — I25119 Atherosclerotic heart disease of native coronary artery with unspecified angina pectoris: Secondary | ICD-10-CM

## 2023-02-08 DIAGNOSIS — I714 Abdominal aortic aneurysm, without rupture, unspecified: Secondary | ICD-10-CM | POA: Diagnosis not present

## 2023-02-08 DIAGNOSIS — I739 Peripheral vascular disease, unspecified: Secondary | ICD-10-CM | POA: Diagnosis not present

## 2023-02-08 DIAGNOSIS — I7143 Infrarenal abdominal aortic aneurysm, without rupture: Secondary | ICD-10-CM

## 2023-02-08 DIAGNOSIS — I6523 Occlusion and stenosis of bilateral carotid arteries: Secondary | ICD-10-CM

## 2023-02-08 DIAGNOSIS — I2510 Atherosclerotic heart disease of native coronary artery without angina pectoris: Secondary | ICD-10-CM

## 2023-02-08 NOTE — Patient Instructions (Addendum)
Medication Instructions:  Stop plavix 5 days before hip surgery, dec 12 On day not on plavix, take asa 81 mg daily , start dec 12th  If you need a refill on your cardiac medications before your next appointment, please call your pharmacy.   Lab work: No new labs needed  Testing/Procedures: Echo in 10/25 for aortic valve stenosis  Follow-Up: At [facility], you and your health needs are our priority.  As part of our continuing mission to provide you with exceptional heart care, we have created designated Provider Care Teams.  These Care Teams include your primary Cardiologist (physician) and Advanced Practice Providers (APPs -  Physician Assistants and Nurse Practitioners) who all work together to provide you with the care you need, when you need it.  You will need a follow up appointment in 12 months  Providers on your designated Care Team:   Nicolasa Ducking, NP Eula Listen, PA-C Cadence Fransico Michael, [place]  COVID-19 Vaccine Information can be found at: [email or web address] For questions related to vaccine distribution or appointments, please email [email or web address] or call [phone].

## 2023-02-11 ENCOUNTER — Telehealth: Payer: Self-pay | Admitting: Cardiovascular Disease

## 2023-02-11 NOTE — Telephone Encounter (Signed)
Patient called and said that he needs a pre-authorization before he can have surgery.

## 2023-02-11 NOTE — Telephone Encounter (Signed)
Spoke to patient and he stated that [facility] needed cardiac clearance for hip surgery Faxed last office note with cardiac clearance to the following:  [facility] [address] [place], [place] [address] Phone: [phone] Fax: [phone]

## 2023-02-11 NOTE — Patient Instructions (Addendum)
SURGICAL WAITING ROOM VISITATION  Patients having surgery or a procedure may have no more than 2 support people in the waiting area - these visitors may rotate.    Children under the age of 61 must have an adult with them who is not the patient.   If the patient needs to stay at the hospital during part of their recovery, the visitor guidelines for inpatient rooms apply. Pre-op nurse will coordinate an appropriate time for 1 support person to accompany patient in pre-op.  This support person may not rotate.    Please refer to the [facility] website for the visitor guidelines for Inpatients (after your surgery is over and you are in a regular room).    Your procedure is scheduled on: 02/25/23   Report to [facility] Main Entrance    Report to admitting at 8:15 AM   Call this number if you have problems the morning of surgery [phone]   Do not eat food :After Midnight.   After Midnight you may have the following liquids until 7:45 AM DAY OF SURGERY  Water Non-Citrus Juices (without pulp, NO RED-Apple, White grape, White cranberry) Black Coffee (NO MILK/CREAM OR CREAMERS, sugar ok)  Clear Tea (NO MILK/CREAM OR CREAMERS, sugar ok) regular and decaf                             Plain Jell-O (NO RED)                                           Fruit ices (not with fruit pulp, NO RED)                                     Popsicles (NO RED)                                                               Sports drinks like Gatorade (NO RED)                 The day of surgery:  Drink ONE (1) Pre-Surgery Clear Ensure at 7:30 AM the morning of surgery. Drink in one sitting. Do not sip.  This drink was given to you during your hospital  pre-op appointment visit. Nothing else to drink after completing the  Pre-Surgery Clear Ensure   by 0745 am.          If you have questions, please contact your surgeon's office.   FOLLOW  ANY ADDITIONAL PRE OP INSTRUCTIONS YOU RECEIVED FROM YOUR  SURGEON'S OFFICE!!!     Oral Hygiene is also important to reduce your risk of infection.                                    Remember - BRUSH YOUR TEETH THE MORNING OF SURGERY WITH YOUR REGULAR TOOTHPASTE  DENTURES WILL BE REMOVED PRIOR TO SURGERY PLEASE DO NOT APPLY "Poly grip" OR ADHESIVES!!!   Do NOT smoke after Midnight  Stop all vitamins and herbal supplements 7 days before surgery.   Take these medicines the morning of surgery with A SIP OF WATER: Tramadol   These are anesthesia recommendations for holding your anticoagulants.  Please contact your prescribing physician to confirm IF it is safe to hold your anticoagulants for this length of time.   Eliquis Apixaban   72 hours   Xarelto Rivaroxaban   72 hours  Plavix Clopidogrel   120 hours  Pletal Cilostazol   120 hours    DO NOT TAKE ANY ORAL DIABETIC MEDICATIONS DAY OF YOUR SURGERY  Bring CPAP mask and tubing day of surgery.                              You may not have any metal on your body including jewelry, and body piercing             Do not wear lotions, powders, cologne, or deodorant              Men may shave face and neck.   Do not bring valuables to the hospital. [facility] IS NOT             RESPONSIBLE   FOR VALUABLES.   Contacts, glasses, dentures or bridgework may not be worn into surgery.   Bring small overnight bag day of surgery.   DO NOT BRING YOUR HOME MEDICATIONS TO THE HOSPITAL. PHARMACY WILL DISPENSE MEDICATIONS LISTED ON YOUR MEDICATION LIST TO YOU DURING YOUR ADMISSION IN THE HOSPITAL!    Special Instructions: Bring a copy of your healthcare power of attorney and living will documents the day of surgery if you haven't scanned them before.              Please read over the following fact sheets you were given: IF YOU HAVE QUESTIONS ABOUT YOUR PRE-OP INSTRUCTIONS PLEASE CALL [phone] Contras     If you test positive for Covid or have been in contact with anyone that has tested  positive in the last 10 days please notify you surgeon.      Pre-operative 5 CHG Bath Instructions   You can play a key role in reducing the risk of infection after surgery. Your skin needs to be as free of germs as possible. You can reduce the number of germs on your skin by washing with CHG (chlorhexidine gluconate) soap before surgery. CHG is an antiseptic soap that kills germs and continues to kill germs even after washing.   DO NOT use if you have an allergy to chlorhexidine/CHG or antibacterial soaps. If your skin becomes reddened or irritated, stop using the CHG and notify one of our RNs at [phone].   Please shower with the CHG soap starting 4 days before surgery using the following schedule:     Please keep in mind the following:  DO NOT shave, including legs and underarms, starting the day of your first shower.   You may shave your face at any point before/day of surgery.  Place clean sheets on your bed the day you start using CHG soap. Use a clean washcloth (not used since being washed) for each shower. DO NOT sleep with pets once you start using the CHG.   CHG Shower Instructions:  If you choose to wash your hair and private area, wash first with your normal shampoo/soap.  After you use shampoo/soap, rinse your hair and body thoroughly to remove shampoo/soap residue.  Turn the water OFF and apply about 3 tablespoons (45 ml) of CHG soap to a CLEAN washcloth.  Apply CHG soap ONLY FROM YOUR NECK DOWN TO YOUR TOES (washing for 3-5 minutes)  DO NOT use CHG soap on face, private areas, open wounds, or sores.  Pay special attention to the area where your surgery is being performed.  If you are having back surgery, having someone wash your back for you may be helpful. Wait 2 minutes after CHG soap is applied, then you may rinse off the CHG soap.  Pat dry with a clean towel  Put on clean clothes/pajamas   If you choose to wear lotion, please use ONLY the CHG-compatible lotions  on the back of this paper.     Additional instructions for the day of surgery: DO NOT APPLY any lotions, deodorants, cologne, or perfumes.   Put on clean/comfortable clothes.  Brush your teeth.  Ask your nurse before applying any prescription medications to the skin.      CHG Compatible Lotions   Aveeno Moisturizing lotion  Cetaphil Moisturizing Cream  Cetaphil Moisturizing Lotion  Clairol Herbal Essence Moisturizing Lotion, Dry Skin  Clairol Herbal Essence Moisturizing Lotion, Extra Dry Skin  Clairol Herbal Essence Moisturizing Lotion, Normal Skin  Curel Age Defying Therapeutic Moisturizing Lotion with Alpha Hydroxy  Curel Extreme Care Body Lotion  Curel Soothing Hands Moisturizing Hand Lotion  Curel Therapeutic Moisturizing Cream, Fragrance-Free  Curel Therapeutic Moisturizing Lotion, Fragrance-Free  Curel Therapeutic Moisturizing Lotion, Original Formula  Eucerin Daily Replenishing Lotion  Eucerin Dry Skin Therapy Plus Alpha Hydroxy Crme  Eucerin Dry Skin Therapy Plus Alpha Hydroxy Lotion  Eucerin Original Crme  Eucerin Original Lotion  Eucerin Plus Crme Eucerin Plus Lotion  Eucerin TriLipid Replenishing Lotion  Keri Anti-Bacterial Hand Lotion  Keri Deep Conditioning Original Lotion Dry Skin Formula Softly Scented  Keri Deep Conditioning Original Lotion, Fragrance Free Sensitive Skin Formula  Keri Lotion Fast Absorbing Fragrance Free Sensitive Skin Formula  Keri Lotion Fast Absorbing Softly Scented Dry Skin Formula  Keri Original Lotion  Keri Skin Renewal Lotion Keri Silky Smooth Lotion  Keri Silky Smooth Sensitive Skin Lotion  Nivea Body Creamy Conditioning Oil  Nivea Body Extra Enriched Teacher, adult education Moisturizing Lotion Nivea Crme  Nivea Skin Firming Lotion  NutraDerm 30 Skin Lotion  NutraDerm Skin Lotion  NutraDerm Therapeutic Skin Cream  NutraDerm Therapeutic Skin Lotion  ProShield Protective Hand Cream  Provon  moisturizing lotion  WHAT IS A BLOOD TRANSFUSION? Blood Transfusion Information  A transfusion is the replacement of blood or some of its parts. Blood is made up of multiple cells which provide different functions. Red blood cells carry oxygen and are used for blood loss replacement. White blood cells fight against infection. Platelets control bleeding. Plasma helps clot blood. Other blood products are available for specialized needs, such as hemophilia or other clotting disorders. BEFORE THE TRANSFUSION  Who gives blood for transfusions?  Healthy volunteers who are fully evaluated to make sure their blood is safe. This is blood bank blood. Transfusion therapy is the safest it has ever been in the practice of medicine. Before blood is taken from a donor, a complete history is taken to make sure that person has no history of diseases nor engages in risky social behavior (examples are intravenous drug use or sexual activity with multiple partners). The donor's travel history is screened to minimize risk of transmitting infections, such as malaria. The donated blood is tested  for signs of infectious diseases, such as HIV and hepatitis. The blood is then tested to be sure it is compatible with you in order to minimize the chance of a transfusion reaction. If you or a relative donates blood, this is often done in anticipation of surgery and is not appropriate for emergency situations. It takes many days to process the donated blood. RISKS AND COMPLICATIONS Although transfusion therapy is very safe and saves many lives, the main dangers of transfusion include:  Getting an infectious disease. Developing a transfusion reaction. This is an allergic reaction to something in the blood you were given. Every precaution is taken to prevent this. The decision to have a blood transfusion has been considered carefully by your caregiver before blood is given. Blood is not given unless the benefits outweigh the  risks. AFTER THE TRANSFUSION Right after receiving a blood transfusion, you will usually feel much better and more energetic. This is especially true if your red blood cells have gotten low (anemic). The transfusion raises the level of the red blood cells which carry oxygen, and this usually causes an energy increase. The nurse administering the transfusion will monitor you carefully for complications. HOME CARE INSTRUCTIONS  No special instructions are needed after a transfusion. You may find your energy is better. Speak with your caregiver about any limitations on activity for underlying diseases you may have. SEEK MEDICAL CARE IF:  Your condition is not improving after your transfusion. You develop redness or irritation at the intravenous (IV) site. SEEK IMMEDIATE MEDICAL CARE IF:  Any of the following symptoms occur over the next 12 hours: Shaking chills. You have a temperature by mouth above 102 F (38.9 C), not controlled by medicine. Chest, back, or muscle pain. People around you feel you are not acting correctly or are confused. Shortness of breath or difficulty breathing. Dizziness and fainting. You get a rash or develop hives. You have a decrease in urine output. Your urine turns a dark color or changes to pink, red, or brown. Any of the following symptoms occur over the next 10 days: You have a temperature by mouth above 102 F (38.9 C), not controlled by medicine. Shortness of breath. Weakness after normal activity. The white part of the eye turns yellow (jaundice). You have a decrease in the amount of urine or are urinating less often. Your urine turns a dark color or changes to pink, red, or brown. Document Released: 03/06/2000 Document Revised: 06/01/2011 Document Reviewed: 10/24/2007 ExitCare Patient Information 2014 [place], [place].  _______________________________________________________________________  Incentive Spirometer  An incentive spirometer is a tool  that can help keep your lungs clear and active. This tool measures how well you are filling your lungs with each breath. Taking long deep breaths may help reverse or decrease the chance of developing breathing (pulmonary) problems (especially infection) following: A long period of time when you are unable to move or be active. BEFORE THE PROCEDURE  If the spirometer includes an indicator to show your best effort, your nurse or respiratory therapist will set it to a desired goal. If possible, sit up straight or lean slightly forward. Try not to slouch. Hold the incentive spirometer in an upright position. INSTRUCTIONS FOR USE  Sit on the edge of your bed if possible, or sit up as far as you can in bed or on a chair. Hold the incentive spirometer in an upright position. Breathe out normally. Place the mouthpiece in your mouth and seal your lips tightly around it. Breathe in  slowly and as deeply as possible, raising the piston or the ball toward the top of the column. Hold your breath for 3-5 seconds or for as long as possible. Allow the piston or ball to fall to the bottom of the column. Remove the mouthpiece from your mouth and breathe out normally. Rest for a few seconds and repeat Steps 1 through 7 at least 10 times every 1-2 hours when you are awake. Take your time and take a few normal breaths between deep breaths. The spirometer may include an indicator to show your best effort. Use the indicator as a goal to work toward during each repetition. After each set of 10 deep breaths, practice coughing to be sure your lungs are clear. If you have an incision (the cut made at the time of surgery), support your incision when coughing by placing a pillow or rolled up towels firmly against it. Once you are able to get out of bed, walk around indoors and cough well. You may stop using the incentive spirometer when instructed by your caregiver.  RISKS AND COMPLICATIONS Take your time so you do not get  dizzy or light-headed. If you are in pain, you may need to take or ask for pain medication before doing incentive spirometry. It is harder to take a deep breath if you are having pain. AFTER USE Rest and breathe slowly and easily. It can be helpful to keep track of a log of your progress. Your caregiver can provide you with a simple table to help with this. If you are using the spirometer at home, follow these instructions: SEEK MEDICAL CARE IF:  You are having difficultly using the spirometer. You have trouble using the spirometer as often as instructed. Your pain medication is not giving enough relief while using the spirometer. You develop fever of 100.5 F (38.1 C) or higher. SEEK IMMEDIATE MEDICAL CARE IF:  You cough up bloody sputum that had not been present before. You develop fever of 102 F (38.9 C) or greater. You develop worsening pain at or near the incision site. MAKE SURE YOU:  Understand these instructions. Will watch your condition. Will get help right away if you are not doing well or get worse. Document Released: 07/20/2006 Document Revised: 06/01/2011 Document Reviewed: 09/20/2006 [facility] Patient Information 2014 [place], [place].   ________________________________________________________________________

## 2023-02-11 NOTE — Telephone Encounter (Signed)
   Pre-operative Risk Assessment    Patient Name: Garrett Moore  DOB: April 28, 1940 MRN: [id number]      Request for Surgical Clearance    Procedure:   RIGHT TOTAL HIP ARTHROPLASTY  Date of Surgery:  Clearance TBD                                Surgeon:  DR Durene Romans Surgeon's Group or Practice Name:  [facility] Phone number:  [phone] Fax number:  [phone]  Type of Clearance Requested:   - Medical    Type of Anesthesia:  Spinal   Additional requests/questions:    Signed, Dalia Heading   02/11/2023, 11:15 AM

## 2023-02-12 ENCOUNTER — Encounter (HOSPITAL_COMMUNITY)
Admission: RE | Admit: 2023-02-12 | Discharge: 2023-02-12 | Disposition: A | Discharge status: Home / Self Care | Payer: Medicare HMO | Source: Ambulatory Visit | Attending: Orthopedic Surgery | Admitting: Orthopedic Surgery

## 2023-02-12 ENCOUNTER — Encounter (HOSPITAL_COMMUNITY): Payer: Self-pay

## 2023-02-12 ENCOUNTER — Other Ambulatory Visit: Payer: Self-pay

## 2023-02-12 VITALS — BP 130/58 | HR 71 | Temp 97.8°F | Resp 16 | Ht 71.0 in | Wt 173.2 lb

## 2023-02-12 DIAGNOSIS — Z01818 Encounter for other preprocedural examination: Secondary | ICD-10-CM

## 2023-02-12 DIAGNOSIS — I480 Paroxysmal atrial fibrillation: Secondary | ICD-10-CM | POA: Diagnosis not present

## 2023-02-12 DIAGNOSIS — N183 Chronic kidney disease, stage 3 unspecified: Secondary | ICD-10-CM | POA: Insufficient documentation

## 2023-02-12 DIAGNOSIS — M1611 Unilateral primary osteoarthritis, right hip: Secondary | ICD-10-CM | POA: Insufficient documentation

## 2023-02-12 DIAGNOSIS — I2510 Atherosclerotic heart disease of native coronary artery without angina pectoris: Secondary | ICD-10-CM | POA: Diagnosis not present

## 2023-02-12 DIAGNOSIS — I739 Peripheral vascular disease, unspecified: Secondary | ICD-10-CM | POA: Diagnosis not present

## 2023-02-12 DIAGNOSIS — Z87891 Personal history of nicotine dependence: Secondary | ICD-10-CM | POA: Diagnosis not present

## 2023-02-12 DIAGNOSIS — J449 Chronic obstructive pulmonary disease, unspecified: Secondary | ICD-10-CM | POA: Insufficient documentation

## 2023-02-12 DIAGNOSIS — Z01812 Encounter for preprocedural laboratory examination: Secondary | ICD-10-CM | POA: Diagnosis not present

## 2023-02-12 LAB — CBC
HCT: 37 % — ABNORMAL LOW (ref 39.0–52.0)
Hemoglobin: 12.3 g/dL — ABNORMAL LOW (ref 13.0–17.0)
MCH: 32.3 pg (ref 26.0–34.0)
MCHC: 33.2 g/dL (ref 30.0–36.0)
MCV: 97.1 fL (ref 80.0–100.0)
Platelets: 284 10*3/uL (ref 150–400)
RBC: 3.81 MIL/uL — ABNORMAL LOW (ref 4.22–5.81)
RDW: 13 % (ref 11.5–15.5)
WBC: 9.7 10*3/uL (ref 4.0–10.5)
nRBC: 0 % (ref 0.0–0.2)

## 2023-02-12 LAB — BASIC METABOLIC PANEL
Anion gap: 8 (ref 5–15)
BUN: 18 mg/dL (ref 8–23)
CO2: 23 mmol/L (ref 22–32)
Calcium: 8.6 mg/dL — ABNORMAL LOW (ref 8.9–10.3)
Chloride: 104 mmol/L (ref 98–111)
Creatinine, Ser: 1.06 mg/dL (ref 0.61–1.24)
GFR, Estimated: >60 mL/min (ref >60)
Glucose, Bld: 133 mg/dL — ABNORMAL HIGH (ref 70–99)
Potassium: 4 mmol/L (ref 3.5–5.1)
Sodium: 135 mmol/L (ref 135–145)

## 2023-02-12 LAB — SURGICAL PCR SCREEN
MRSA, PCR: NEGATIVE
Staphylococcus aureus: NEGATIVE

## 2023-02-12 LAB — TYPE AND SCREEN
ABO/RH(D): A POS
Antibody Screen: NEGATIVE

## 2023-02-12 NOTE — Progress Notes (Addendum)
PCP - Gildardo Cranker, MD Cardiologist - Timmothy Gollan clearance 02-08-23 epic   PPM/ICD -  Device Orders -  Rep Notified -   Chest x-ray -  EKG - 07-07-22 epic Stress Test - 2012 ECHO - 01-06-23 Cardiac Cath - 1985  Sleep Study -  CPAP -   Fasting Blood Sugar -  Checks Blood Sugar _____ times a day  Blood Thinner Instructions:plavix hold 5 days per pt. Aspirin Instructions:81 mg remain on  ERAS Protcol - PRE-SURGERY Ensure    COVID vaccine -yes  Activity--Able to climb a flight of stairs without CP or SOB Anesthesia review: COPD, AAA, aortic valve stenosis ,A-fib, CKD,CHF,Stroke, MI  Patient denies shortness of breath, fever, cough and chest pain at PAT appointment   All instructions explained to the patient, with a verbal understanding of the material. Patient agrees to go over the instructions while at home for a better understanding. Patient also instructed to self quarantine after being tested for COVID-19. The opportunity to ask questions was provided.

## 2023-02-15 NOTE — Anesthesia Preprocedure Evaluation (Addendum)
Anesthesia Evaluation  Patient identified by MRN, date of birth, ID band Patient awake    Reviewed: Allergy & Precautions, NPO status , Patient's Chart, lab work & pertinent test results  Airway Mallampati: II  TM Distance: >3 FB Neck ROM: Full    Dental  (+) Dental Advisory Given, Edentulous Upper, Edentulous Lower   Pulmonary COPD, former smoker   Pulmonary exam normal breath sounds clear to auscultation       Cardiovascular + CAD, + Past MI, + Peripheral Vascular Disease (s/p insertion of BILATERAL iliac artery stents; s/p EVAR for AAA), +CHF and + DVT  + Valvular Problems/Murmurs AS  Rhythm:Regular Rate:Normal + Systolic murmurs Echocardiogram January 06, 2023 Normal ejection fraction 60% Moderate aortic valve stenosis, numbers appear relatively unchanged compared to prior echocardiogram April 2024 and March 2022    Neuro/Psych CVA    GI/Hepatic Neg liver ROS,GERD  ,,  Endo/Other  negative endocrine ROS    Renal/GU Renal InsufficiencyRenal disease     Musculoskeletal  (+) Arthritis , Osteoarthritis,    Abdominal   Peds  Hematology  (+) Blood dyscrasia (Plavix), anemia Plt 284k   Anesthesia Other Findings Day of surgery medications reviewed with the patient.  Reproductive/Obstetrics                             Anesthesia Physical Anesthesia Plan  ASA: 3  Anesthesia Plan: General   Post-op Pain Management: Tylenol PO (pre-op)*   Induction: Intravenous  PONV Risk Score and Plan: 2 and Dexamethasone and Ondansetron  Airway Management Planned: Oral ETT  Additional Equipment: ClearSight  Intra-op Plan:   Post-operative Plan: Extubation in OR  Informed Consent: I have reviewed the patients History and Physical, chart, labs and discussed the procedure including the risks, benefits and alternatives for the proposed anesthesia with the patient or authorized representative who has  indicated his/her understanding and acceptance.     Dental advisory given  Plan Discussed with: CRNA  Anesthesia Plan Comments: (See PAT note 02/12/23)       Anesthesia Quick Evaluation

## 2023-02-15 NOTE — Progress Notes (Signed)
Anesthesia Chart Review   Case: [id number] Date/Time: 02/25/23 1030   Procedure: TOTAL HIP ARTHROPLASTY ANTERIOR APPROACH (Right: Hip)   Anesthesia type: Spinal   Pre-op diagnosis: Right hip osteoaarthtis   Location: [facility] ROOM 09 / [facility]   Surgeons: Durene Romans, MD       DISCUSSION:82 y.o. former smoker with h/o COPD, PVD, AAA s/p endograft repair stable on ultrasound 12/2022, CAD, moderate to severe AS (valve area 0.90 cm2, mean gradient 24.0 mmHg), mild to moderate carotid stenosis, CKD Stage III, right hip OA scheduled fora bove procedure 02/25/2023 with Dr. Durene Romans.   Pt last seen by cardiology 02/08/2023. Per OV note, "Acceptable risk for hip surgery, no further cardiac testing needed"   Echocardiogram January 06, 2023 Normal ejection fraction 60% Moderate aortic valve stenosis, numbers appear relatively unchanged compared to prior echocardiogram April 2024 and March 2022  Pt advised to hold Plavix 5 days prior to procedure.  VS: BP (!) 130/58   Pulse 71   Temp 36.6 C (Oral)   Resp 16   Ht 5\' 11"  (1.803 m)   Wt 78.6 kg   SpO2 97%   BMI 24.16 kg/m   PROVIDERS: Daisy Floro, MD is PCP   Julien Nordmann, MD is Cardiologist  LABS: Labs reviewed: Acceptable for surgery. (all labs ordered are listed, but only abnormal results are displayed)  Labs Reviewed  BASIC METABOLIC PANEL - Abnormal; Notable for the following components:      Result Value   Glucose, Bld 133 (*)    Calcium 8.6 (*)    All other components within normal limits  CBC - Abnormal; Notable for the following components:   RBC 3.81 (*)    Hemoglobin 12.3 (*)    HCT 37.0 (*)    All other components within normal limits  SURGICAL PCR SCREEN  TYPE AND SCREEN     IMAGES:   EKG:   CV: Echo 01/06/2023 1. Left ventricular ejection fraction, by estimation, is 60 to 65%. The  left ventricle has normal function. The left ventricle has no regional  wall motion abnormalities.   2. Right  ventricular systolic function is normal. The right ventricular  size is normal.   3. The mitral valve is normal in structure. Mild mitral valve  regurgitation. No evidence of mitral stenosis.   4. The aortic valve is normal in structure. There is severe calcifcation  of the aortic valve. Aortic valve regurgitation is mild. Moderate to  severe aortic valve stenosis. Aortic valve area, by VTI measures 0.90 cm.  Aortic valve mean gradient measures  24.0 mmHg. Aortic valve Vmax measures 3.29 m/s.   5. The inferior vena cava is normal in size with greater than 50%  respiratory variability, suggesting right atrial pressure of 3 mmHg.  Past Medical History:  Diagnosis Date   Aneurysm of infrarenal abdominal aorta (HCC) 02/08/2018   a.) TTE 02/08/2018: Ao root 38 mm, asc Ao 40 mm; b.) TTE 06/14/2020; asc Ao 28 mm; c.) CT hematuria 05/10/2020: infrarenal AAA measuring 5.9 cm; d.) s/p EVAR 07/03/2020; e.) CT A/P 10/14/2021: residual aneurysmal sac (s/p EVAR) measured 6.0 cm   Aortic atherosclerosis (HCC)    Aortic valvar stenosis 07/21/2012   a.) TTE 07/21/2012: EF 50-55%, mild AS (AVA = 1.67, MPG 9); b.) TTE 02/08/2018: EF 60-65%; mod AS (MPG 17); c.) TTE 06/14/2020: EF 60-65%, mod AS (MPG 26.8)   Atherosclerosis of native arteries of extremity with intermittent claudication (HCC)    Basal cell carcinoma,  face    BPH (benign prostatic hyperplasia)    Carotid artery stenosis 05/21/2006   a.) carotid US 05/21/2006: 0-49% BICA; b.) carotid US 06/10/2020: 1-39% RICA, total occ LICA   CKD (chronic kidney disease), stage III (HCC)    COPD (chronic obstructive pulmonary disease) (HCC)    Coronary artery disease 08/14/1983   a.) MI --> LHC 08/13/2016: CTO of the RCA --> PTCA performed resulting in 30% residual stenosis; aberrant takeoff of his RCA coming off somewhat high and posteriorly.   Diastolic dysfunction 02/08/2018   a.) TTE 02/08/2018: EF 60-65%, mild MAC,  triv TR, mild-mod AS, G1DD; b.) TTE  06/14/2020: EF 60-65%, mod AS, triv TR, mild AR, G1DD   Diverticulosis    Family history of adverse reaction to anesthesia    a.) extended GA course for mother --> postoperaitve memory problems/dementia   GERD (gastroesophageal reflux disease)    Glaucoma    Heart murmur    History of kidney stones    Hyperlipidemia    Iliac artery aneurysm, bilateral (HCC) 07/03/2020   a.) s/p insertion of BILATERAL iliac artery stents   Myocardial infarction (HCC) 08/13/2016   a.) MI --> LHC 08/13/2016: CTO of the RCA --> PTCA performed resulting in 30% residual stenosis. Procedure complicated by procedural nausea, Patient went into IVR and then AV disaccociated rhythm. SBP dropped (80s). TVP floated with capture, which stabilized patient. ICU admission overnight.   OA (osteoarthritis)    Peripheral vascular disease (HCC)    right leg   Postoperative deep vein thrombosis (DVT) (HCC)    a.) postop age 32 following tonsillectomy (per patient report)    Past Surgical History:  Procedure Laterality Date   CATARACT EXTRACTION, BILATERAL  01/2019   CORONARY ANGIOPLASTY  08/14/1983   Procedure: CORONARY ANGIOPLASTY (PTCA pRCA); Location: [facility]; Surgeon: Al Little, MD   CYSTOSCOPY W/ RETROGRADES  08/12/2020   Procedure: CYSTOSCOPY WITH RETROGRADE PYELOGRAM;  Surgeon: Vanna Scotland, MD;  Location: [facility];  Service: Urology;;   CYSTOSCOPY WITH INSERTION OF UROLIFT N/A 12/22/2021   Procedure: CYSTOSCOPY WITH INSERTION OF UROLIFT;  Surgeon: Vanna Scotland, MD;  Location: [facility];  Service: Urology;  Laterality: N/A;   CYSTOSCOPY/URETEROSCOPY/HOLMIUM LASER/STENT PLACEMENT Left 08/12/2020   Procedure: CYSTOSCOPY/URETEROSCOPY/HOLMIUM LASER/STENT PLACEMENT;  Surgeon: Vanna Scotland, MD;  Location: [facility];  Service: Urology;  Laterality: Left;   ENDOVASCULAR REPAIR/STENT GRAFT N/A 07/03/2020   Procedure: ENDOVASCULAR REPAIR/STENT GRAFT;  Surgeon: Renford Dills, MD;  Location: [facility];   Service: Cardiovascular;  Laterality: N/A;   PELVIC ANGIOGRAPHY Right 10/14/2021   Procedure: PELVIC ANGIOGRAPHY;  Surgeon: Renford Dills, MD;  Location: [facility];  Service: Cardiovascular;  Laterality: Right;   PERIPHERAL VASCULAR THROMBECTOMY Right 10/14/2021   Procedure: PERIPHERAL VASCULAR THROMBECTOMY;  Surgeon: Renford Dills, MD;  Location: [facility];  Service: Cardiovascular;  Laterality: Right;   ROTATOR CUFF REPAIR Left    TEMPORARY PACEMAKER  08/14/1983   Procedure: TEMPORARY PACEMAKER PLACEMENT; Location: [facility]; Surgeon: Al Little, MD   TONSILLECTOMY     TOTAL HIP ARTHROPLASTY Left 02/21/2019   Procedure: TOTAL HIP ARTHROPLASTY ANTERIOR APPROACH;  Surgeon: Durene Romans, MD;  Location: [facility];  Service: Orthopedics;  Laterality: Left;  70 mins   VITRECTOMY Bilateral     MEDICATIONS:  clopidogrel (PLAVIX) 75 MG tablet   latanoprost (XALATAN) 0.005 % ophthalmic solution   Multiple Vitamin (MULTIVITAMIN WITH MINERALS) TABS tablet   nicotine polacrilex (COMMIT) 2 MG lozenge  simvastatin (ZOCOR) 80 MG tablet   traMADol (ULTRAM) 50 MG tablet   No current facility-administered medications for this encounter.    Jodell Cipro Ward, PA-C [facility] [phone]

## 2023-02-15 NOTE — Telephone Encounter (Signed)
   Patient Name: Garrett Moore  DOB: 1940-10-12 MRN: [id number]  Primary Cardiologist: Julien Nordmann, MD  Chart reviewed as part of pre-operative protocol coverage. Pre-op clearance already addressed by colleagues in earlier phone notes. To summarize recommendations:  -Preop cardiovascular evaluation hip surgery. Acceptable risk for hip surgery, no further cardiac testing needed -Dr. Mariah Milling  Will route this bundled recommendation to requesting provider via Epic fax function and remove from pre-op pool. Please call with questions.  Sharlene Dory, PA-C 02/15/2023, 1:21 PM

## 2023-02-24 ENCOUNTER — Encounter (HOSPITAL_COMMUNITY): Payer: Medicare HMO

## 2023-02-25 ENCOUNTER — Observation Stay (HOSPITAL_COMMUNITY)
Admission: RE | Admit: 2023-02-25 | Discharge: 2023-02-26 | Disposition: A | Discharge status: Home / Self Care | Payer: Medicare HMO | Attending: Orthopedic Surgery | Admitting: Orthopedic Surgery

## 2023-02-25 ENCOUNTER — Other Ambulatory Visit: Payer: Self-pay

## 2023-02-25 ENCOUNTER — Ambulatory Visit (HOSPITAL_COMMUNITY): Payer: Medicare HMO

## 2023-02-25 ENCOUNTER — Ambulatory Visit (HOSPITAL_BASED_OUTPATIENT_CLINIC_OR_DEPARTMENT_OTHER): Payer: Self-pay | Admitting: Anesthesiology

## 2023-02-25 ENCOUNTER — Encounter (HOSPITAL_COMMUNITY)
Admission: RE | Disposition: A | Discharge status: Home / Self Care | Payer: Self-pay | Source: Home / Self Care | Attending: Orthopedic Surgery

## 2023-02-25 ENCOUNTER — Observation Stay (HOSPITAL_COMMUNITY): Payer: Medicare HMO

## 2023-02-25 ENCOUNTER — Ambulatory Visit (HOSPITAL_COMMUNITY): Payer: Medicare HMO | Admitting: Physician Assistant

## 2023-02-25 ENCOUNTER — Encounter (HOSPITAL_COMMUNITY): Payer: Self-pay | Admitting: Orthopedic Surgery

## 2023-02-25 DIAGNOSIS — Z85828 Personal history of other malignant neoplasm of skin: Secondary | ICD-10-CM | POA: Diagnosis not present

## 2023-02-25 DIAGNOSIS — J449 Chronic obstructive pulmonary disease, unspecified: Secondary | ICD-10-CM | POA: Diagnosis not present

## 2023-02-25 DIAGNOSIS — Z87891 Personal history of nicotine dependence: Secondary | ICD-10-CM | POA: Insufficient documentation

## 2023-02-25 DIAGNOSIS — Z86718 Personal history of other venous thrombosis and embolism: Secondary | ICD-10-CM | POA: Diagnosis not present

## 2023-02-25 DIAGNOSIS — I5032 Chronic diastolic (congestive) heart failure: Secondary | ICD-10-CM | POA: Insufficient documentation

## 2023-02-25 DIAGNOSIS — I2510 Atherosclerotic heart disease of native coronary artery without angina pectoris: Secondary | ICD-10-CM | POA: Diagnosis not present

## 2023-02-25 DIAGNOSIS — M1611 Unilateral primary osteoarthritis, right hip: Principal | ICD-10-CM | POA: Insufficient documentation

## 2023-02-25 DIAGNOSIS — Z79899 Other long term (current) drug therapy: Secondary | ICD-10-CM | POA: Diagnosis not present

## 2023-02-25 DIAGNOSIS — Z7902 Long term (current) use of antithrombotics/antiplatelets: Secondary | ICD-10-CM | POA: Insufficient documentation

## 2023-02-25 DIAGNOSIS — Z471 Aftercare following joint replacement surgery: Secondary | ICD-10-CM | POA: Diagnosis not present

## 2023-02-25 DIAGNOSIS — Z96643 Presence of artificial hip joint, bilateral: Secondary | ICD-10-CM | POA: Diagnosis not present

## 2023-02-25 DIAGNOSIS — N1831 Chronic kidney disease, stage 3a: Secondary | ICD-10-CM | POA: Insufficient documentation

## 2023-02-25 DIAGNOSIS — Z96642 Presence of left artificial hip joint: Secondary | ICD-10-CM | POA: Diagnosis not present

## 2023-02-25 DIAGNOSIS — I480 Paroxysmal atrial fibrillation: Secondary | ICD-10-CM | POA: Diagnosis not present

## 2023-02-25 DIAGNOSIS — Z96641 Presence of right artificial hip joint: Secondary | ICD-10-CM | POA: Diagnosis not present

## 2023-02-25 DIAGNOSIS — N183 Chronic kidney disease, stage 3 unspecified: Secondary | ICD-10-CM | POA: Diagnosis not present

## 2023-02-25 HISTORY — PX: TOTAL HIP ARTHROPLASTY: SHX124

## 2023-02-25 SURGERY — ARTHROPLASTY, HIP, TOTAL, ANTERIOR APPROACH
Anesthesia: General | Site: Hip | Laterality: Right

## 2023-02-25 MED ORDER — GLYCOPYRROLATE 0.2 MG/ML IJ SOLN
INTRAMUSCULAR | Status: AC
  Filled 2023-02-25: qty 1

## 2023-02-25 MED ORDER — ACETAMINOPHEN 500 MG PO TABS
1000.0000 mg | ORAL_TABLET | Freq: Once | ORAL | Status: AC
  Administered 2023-02-25: 1000 mg via ORAL
  Filled 2023-02-25: qty 2

## 2023-02-25 MED ORDER — SENNA 8.6 MG PO TABS
2.0000 | ORAL_TABLET | Freq: Every day | ORAL | Status: DC
  Administered 2023-02-25: 17.2 mg via ORAL
  Filled 2023-02-25: qty 2

## 2023-02-25 MED ORDER — ALUM & MAG HYDROXIDE-SIMETH 200-200-20 MG/5ML PO SUSP
30.0000 mL | ORAL | Status: DC | PRN
Start: 2023-02-25 — End: 2023-02-26

## 2023-02-25 MED ORDER — KETOROLAC TROMETHAMINE 30 MG/ML IJ SOLN
INTRAMUSCULAR | Status: DC | PRN
  Administered 2023-02-25: 30 mg

## 2023-02-25 MED ORDER — PROPOFOL 10 MG/ML IV BOLUS
INTRAVENOUS | Status: AC
  Filled 2023-02-25: qty 20

## 2023-02-25 MED ORDER — LIDOCAINE HCL (PF) 2 % IJ SOLN
INTRAMUSCULAR | Status: AC
  Filled 2023-02-25: qty 5

## 2023-02-25 MED ORDER — METOCLOPRAMIDE HCL 5 MG/ML IJ SOLN
5.0000 mg | Freq: Three times a day (TID) | INTRAMUSCULAR | Status: DC | PRN
Start: 2023-02-25 — End: 2023-02-26

## 2023-02-25 MED ORDER — SODIUM CHLORIDE (PF) 0.9 % IJ SOLN
INTRAMUSCULAR | Status: AC
Start: 2023-02-25 — End: ?
  Filled 2023-02-25: qty 50

## 2023-02-25 MED ORDER — ETOMIDATE 2 MG/ML IV SOLN
INTRAVENOUS | Status: DC | PRN
  Administered 2023-02-25: 6 mg via INTRAVENOUS

## 2023-02-25 MED ORDER — ROCURONIUM BROMIDE 10 MG/ML (PF) SYRINGE
PREFILLED_SYRINGE | INTRAVENOUS | Status: DC | PRN
  Administered 2023-02-25: 60 mg via INTRAVENOUS

## 2023-02-25 MED ORDER — SODIUM CHLORIDE 0.9% FLUSH
10.0000 mL | Freq: Two times a day (BID) | INTRAVENOUS | Status: DC
  Administered 2023-02-26: 10 mL via INTRAVENOUS

## 2023-02-25 MED ORDER — FENTANYL CITRATE (PF) 100 MCG/2ML IJ SOLN
INTRAMUSCULAR | Status: DC | PRN
  Administered 2023-02-25: 25 ug via INTRAVENOUS
  Administered 2023-02-25: 50 ug via INTRAVENOUS
  Administered 2023-02-25: 25 ug via INTRAVENOUS

## 2023-02-25 MED ORDER — KETOROLAC TROMETHAMINE 30 MG/ML IJ SOLN
INTRAMUSCULAR | Status: AC
  Filled 2023-02-25: qty 1

## 2023-02-25 MED ORDER — ACETAMINOPHEN 500 MG PO TABS
1000.0000 mg | ORAL_TABLET | Freq: Four times a day (QID) | ORAL | Status: DC
  Administered 2023-02-25 – 2023-02-26 (×3): 1000 mg via ORAL
  Filled 2023-02-25 (×4): qty 2

## 2023-02-25 MED ORDER — TRANEXAMIC ACID-NACL 1000-0.7 MG/100ML-% IV SOLN
1000.0000 mg | INTRAVENOUS | Status: AC
  Administered 2023-02-25: 1000 mg via INTRAVENOUS
  Filled 2023-02-25: qty 100

## 2023-02-25 MED ORDER — 0.9 % SODIUM CHLORIDE (POUR BTL) OPTIME
TOPICAL | Status: DC | PRN
  Administered 2023-02-25: 1000 mL

## 2023-02-25 MED ORDER — SODIUM CHLORIDE 0.9% FLUSH: 10.0000 mL | Freq: Two times a day (BID) | INTRAVENOUS | Status: DC

## 2023-02-25 MED ORDER — PHENYLEPHRINE 80 MCG/ML (10ML) SYRINGE FOR IV PUSH (FOR BLOOD PRESSURE SUPPORT)
PREFILLED_SYRINGE | INTRAVENOUS | Status: DC | PRN
  Administered 2023-02-25 (×2): 160 ug via INTRAVENOUS
  Administered 2023-02-25: 80 ug via INTRAVENOUS

## 2023-02-25 MED ORDER — LIDOCAINE 2% (20 MG/ML) 5 ML SYRINGE
INTRAMUSCULAR | Status: DC | PRN
  Administered 2023-02-25: 80 mg via INTRAVENOUS

## 2023-02-25 MED ORDER — SODIUM CHLORIDE 0.9 % IV SOLN: INTRAVENOUS | Status: DC | PRN

## 2023-02-25 MED ORDER — METHOCARBAMOL 500 MG PO TABS
ORAL_TABLET | ORAL | Status: AC
  Filled 2023-02-25: qty 1

## 2023-02-25 MED ORDER — TRAMADOL HCL 50 MG PO TABS
50.0000 mg | ORAL_TABLET | Freq: Four times a day (QID) | ORAL | Status: DC | PRN
  Administered 2023-02-25 (×2): 50 mg via ORAL
  Filled 2023-02-25: qty 1

## 2023-02-25 MED ORDER — FENTANYL CITRATE PF 50 MCG/ML IJ SOSY: 25.0000 ug | PREFILLED_SYRINGE | INTRAMUSCULAR | Status: DC | PRN

## 2023-02-25 MED ORDER — HYDROMORPHONE HCL 1 MG/ML IJ SOLN: 0.5000 mg | INTRAMUSCULAR | Status: DC | PRN

## 2023-02-25 MED ORDER — NICOTINE POLACRILEX 2 MG MT GUM: 2.0000 mg | CHEWING_GUM | OROMUCOSAL | Status: DC | PRN

## 2023-02-25 MED ORDER — TRAMADOL HCL 50 MG PO TABS
ORAL_TABLET | ORAL | Status: AC
  Filled 2023-02-25: qty 1

## 2023-02-25 MED ORDER — DIPHENHYDRAMINE HCL 12.5 MG/5ML PO ELIX: 12.5000 mg | ORAL_SOLUTION | ORAL | Status: DC | PRN

## 2023-02-25 MED ORDER — CEFAZOLIN SODIUM-DEXTROSE 2-4 GM/100ML-% IV SOLN
2.0000 g | Freq: Four times a day (QID) | INTRAVENOUS | Status: AC
  Administered 2023-02-25 – 2023-02-26 (×2): 2 g via INTRAVENOUS
  Filled 2023-02-25 (×2): qty 100

## 2023-02-25 MED ORDER — BUPIVACAINE-EPINEPHRINE 0.25% -1:200000 IJ SOLN
INTRAMUSCULAR | Status: AC
  Filled 2023-02-25: qty 1

## 2023-02-25 MED ORDER — BUPIVACAINE-EPINEPHRINE (PF) 0.25% -1:200000 IJ SOLN
INTRAMUSCULAR | Status: DC | PRN
  Administered 2023-02-25: 30 mL via PERINEURAL

## 2023-02-25 MED ORDER — ONDANSETRON HCL 4 MG/2ML IJ SOLN
INTRAMUSCULAR | Status: DC | PRN
  Administered 2023-02-25: 4 mg via INTRAVENOUS

## 2023-02-25 MED ORDER — BISACODYL 10 MG RE SUPP: 10.0000 mg | Freq: Every day | RECTAL | Status: DC | PRN

## 2023-02-25 MED ORDER — ROCURONIUM BROMIDE 10 MG/ML (PF) SYRINGE
PREFILLED_SYRINGE | INTRAVENOUS | Status: AC
  Filled 2023-02-25: qty 10

## 2023-02-25 MED ORDER — TRANEXAMIC ACID-NACL 1000-0.7 MG/100ML-% IV SOLN
1000.0000 mg | Freq: Once | INTRAVENOUS | Status: AC
  Administered 2023-02-25: 1000 mg via INTRAVENOUS
  Filled 2023-02-25: qty 100

## 2023-02-25 MED ORDER — PHENYLEPHRINE HCL-NACL 20-0.9 MG/250ML-% IV SOLN
INTRAVENOUS | Status: DC | PRN
  Administered 2023-02-25: 50 ug/min via INTRAVENOUS

## 2023-02-25 MED ORDER — LACTATED RINGERS IV SOLN: INTRAVENOUS | Status: DC

## 2023-02-25 MED ORDER — CEFAZOLIN SODIUM-DEXTROSE 2-4 GM/100ML-% IV SOLN
2.0000 g | INTRAVENOUS | Status: AC
  Administered 2023-02-25: 2 g via INTRAVENOUS
  Filled 2023-02-25: qty 100

## 2023-02-25 MED ORDER — DEXAMETHASONE SODIUM PHOSPHATE 10 MG/ML IJ SOLN
INTRAMUSCULAR | Status: AC
  Filled 2023-02-25: qty 1

## 2023-02-25 MED ORDER — ONDANSETRON HCL 4 MG/2ML IJ SOLN
INTRAMUSCULAR | Status: AC
  Filled 2023-02-25: qty 2

## 2023-02-25 MED ORDER — STERILE WATER FOR IRRIGATION IR SOLN
Status: DC | PRN
  Administered 2023-02-25: 1000 mL

## 2023-02-25 MED ORDER — METHOCARBAMOL 500 MG PO TABS
500.0000 mg | ORAL_TABLET | Freq: Four times a day (QID) | ORAL | Status: DC | PRN
  Administered 2023-02-25: 500 mg via ORAL

## 2023-02-25 MED ORDER — ONDANSETRON HCL 4 MG PO TABS: 4.0000 mg | ORAL_TABLET | Freq: Four times a day (QID) | ORAL | Status: DC | PRN

## 2023-02-25 MED ORDER — SODIUM CHLORIDE (PF) 0.9 % IJ SOLN
INTRAMUSCULAR | Status: DC | PRN
  Administered 2023-02-25: 30 mL

## 2023-02-25 MED ORDER — DEXAMETHASONE SODIUM PHOSPHATE 10 MG/ML IJ SOLN
10.0000 mg | Freq: Once | INTRAMUSCULAR | Status: AC
Start: 2023-02-26 — End: 2023-02-26
  Administered 2023-02-26: 10 mg via INTRAVENOUS
  Filled 2023-02-25: qty 1

## 2023-02-25 MED ORDER — POVIDONE-IODINE 10 % EX SWAB: 2.0000 | Freq: Once | CUTANEOUS | Status: DC

## 2023-02-25 MED ORDER — FENTANYL CITRATE (PF) 100 MCG/2ML IJ SOLN
INTRAMUSCULAR | Status: AC
  Filled 2023-02-25: qty 2

## 2023-02-25 MED ORDER — METHOCARBAMOL 1000 MG/10ML IJ SOLN: 500.0000 mg | Freq: Four times a day (QID) | INTRAMUSCULAR | Status: DC | PRN

## 2023-02-25 MED ORDER — DEXAMETHASONE SODIUM PHOSPHATE 10 MG/ML IJ SOLN
8.0000 mg | Freq: Once | INTRAMUSCULAR | Status: AC
  Administered 2023-02-25: 10 mg via INTRAVENOUS

## 2023-02-25 MED ORDER — ONDANSETRON HCL 4 MG/2ML IJ SOLN: 4.0000 mg | Freq: Once | INTRAMUSCULAR | Status: DC | PRN

## 2023-02-25 MED ORDER — ONDANSETRON HCL 4 MG/2ML IJ SOLN: 4.0000 mg | Freq: Four times a day (QID) | INTRAMUSCULAR | Status: DC | PRN

## 2023-02-25 MED ORDER — POLYETHYLENE GLYCOL 3350 17 G PO PACK
17.0000 g | PACK | Freq: Two times a day (BID) | ORAL | Status: DC
  Administered 2023-02-26: 17 g via ORAL
  Filled 2023-02-25: qty 1

## 2023-02-25 MED ORDER — ORAL CARE MOUTH RINSE: 15.0000 mL | Freq: Once | OROMUCOSAL | Status: AC

## 2023-02-25 MED ORDER — METOCLOPRAMIDE HCL 5 MG PO TABS: 5.0000 mg | ORAL_TABLET | Freq: Three times a day (TID) | ORAL | Status: DC | PRN

## 2023-02-25 MED ORDER — CHLORHEXIDINE GLUCONATE 0.12 % MT SOLN
15.0000 mL | Freq: Once | OROMUCOSAL | Status: AC
Start: 2023-02-25 — End: 2023-02-25
  Administered 2023-02-25: 15 mL via OROMUCOSAL

## 2023-02-25 MED ORDER — LATANOPROST 0.005 % OP SOLN
1.0000 [drp] | Freq: Every day | OPHTHALMIC | Status: DC
Start: 2023-02-25 — End: 2023-02-26
  Administered 2023-02-25: 1 [drp] via OPHTHALMIC
  Filled 2023-02-25: qty 2.5

## 2023-02-25 MED ORDER — SUGAMMADEX SODIUM 200 MG/2ML IV SOLN
INTRAVENOUS | Status: DC | PRN
  Administered 2023-02-25: 200 mg via INTRAVENOUS

## 2023-02-25 MED ORDER — MENTHOL 3 MG MT LOZG: 1.0000 | LOZENGE | OROMUCOSAL | Status: DC | PRN

## 2023-02-25 MED ORDER — PHENOL 1.4 % MT LIQD: 1.0000 | OROMUCOSAL | Status: DC | PRN

## 2023-02-25 MED ORDER — ATORVASTATIN CALCIUM 40 MG PO TABS
40.0000 mg | ORAL_TABLET | Freq: Every day | ORAL | Status: DC
  Administered 2023-02-25 – 2023-02-26 (×2): 40 mg via ORAL
  Filled 2023-02-25 (×2): qty 1

## 2023-02-25 MED ORDER — PROPOFOL 10 MG/ML IV BOLUS
INTRAVENOUS | Status: DC | PRN
  Administered 2023-02-25: 50 mg via INTRAVENOUS

## 2023-02-25 MED ORDER — ETOMIDATE 2 MG/ML IV SOLN
INTRAVENOUS | Status: AC
  Filled 2023-02-25: qty 10

## 2023-02-25 MED ORDER — OXYCODONE HCL 5 MG PO TABS: 5.0000 mg | ORAL_TABLET | ORAL | Status: DC | PRN

## 2023-02-25 MED ORDER — ASPIRIN 81 MG PO CHEW
81.0000 mg | CHEWABLE_TABLET | Freq: Two times a day (BID) | ORAL | Status: DC
  Administered 2023-02-25 – 2023-02-26 (×2): 81 mg via ORAL
  Filled 2023-02-25 (×2): qty 1

## 2023-02-25 SURGICAL SUPPLY — 39 items
BAG COUNTER SPONGE SURGICOUNT (BAG) IMPLANT
BAG ZIPLOCK 12X15 (MISCELLANEOUS) IMPLANT
BLADE SAG 18X100X1.27 (BLADE) ×1 IMPLANT
COVER PERINEAL POST (MISCELLANEOUS) ×1 IMPLANT
COVER SURGICAL LIGHT HANDLE (MISCELLANEOUS) ×1 IMPLANT
CUP ACET PINNACLE SECTR 56MM (Hips) IMPLANT
DERMABOND ADVANCED .7 DNX12 (GAUZE/BANDAGES/DRESSINGS) ×1 IMPLANT
DRAPE FOOT SWITCH (DRAPES) ×1 IMPLANT
DRAPE STERI IOBAN 125X83 (DRAPES) ×1 IMPLANT
DRAPE U-SHAPE 47X51 STRL (DRAPES) ×2 IMPLANT
DRESSING AQUACEL AG SP 3.5X10 (GAUZE/BANDAGES/DRESSINGS) ×1 IMPLANT
DRSG AQUACEL AG ADV 3.5X10 (GAUZE/BANDAGES/DRESSINGS) IMPLANT
DRSG AQUACEL AG SP 3.5X10 (GAUZE/BANDAGES/DRESSINGS) ×1
DURAPREP 26ML APPLICATOR (WOUND CARE) ×1 IMPLANT
ELECT REM PT RETURN 15FT ADLT (MISCELLANEOUS) ×1 IMPLANT
GLOVE BIO SURGEON STRL SZ 6 (GLOVE) ×1 IMPLANT
GLOVE BIOGEL PI IND STRL 6.5 (GLOVE) ×1 IMPLANT
GLOVE BIOGEL PI IND STRL 7.5 (GLOVE) ×1 IMPLANT
GLOVE ORTHO TXT STRL SZ7.5 (GLOVE) ×2 IMPLANT
GOWN STRL REUS W/ TWL LRG LVL3 (GOWN DISPOSABLE) ×2 IMPLANT
HEAD M SROM 36MM PLUS 1.5 (Hips) IMPLANT
HOLDER FOLEY CATH W/STRAP (MISCELLANEOUS) ×1 IMPLANT
KIT TURNOVER KIT A (KITS) IMPLANT
NDL SAFETY ECLIPSE 18X1.5 (NEEDLE) IMPLANT
PACK ANTERIOR HIP CUSTOM (KITS) ×1 IMPLANT
PINNACLE ALTRX PLUS 4 N 36X56 (Hips) IMPLANT
PINNACLE SECTOR CUP 56MM (Hips) ×1 IMPLANT
SCREW 6.5MMX35MM (Screw) IMPLANT
SROM M HEAD 36MM PLUS 1.5 (Hips) ×1 IMPLANT
STEM FEMORAL SZ9 HIGH ACTIS (Stem) IMPLANT
SUT MNCRL AB 4-0 PS2 18 (SUTURE) ×1 IMPLANT
SUT STRATAFIX 0 PDS 27 VIOLET (SUTURE) ×1
SUT VIC AB 1 CT1 36 (SUTURE) ×3 IMPLANT
SUT VIC AB 2-0 CT1 TAPERPNT 27 (SUTURE) ×2 IMPLANT
SUTURE STRATFX 0 PDS 27 VIOLET (SUTURE) ×1 IMPLANT
SYR 3ML LL SCALE MARK (SYRINGE) IMPLANT
TRAY FOLEY MTR SLVR 16FR STAT (SET/KITS/TRAYS/PACK) IMPLANT
TUBE SUCTION HIGH CAP CLEAR NV (SUCTIONS) ×1 IMPLANT
WATER STERILE IRR 1000ML POUR (IV SOLUTION) ×1 IMPLANT

## 2023-02-25 NOTE — Plan of Care (Signed)
Problem: Education: Goal: Knowledge of General Education information will improve Description: Including pain rating scale, medication(s)/side effects and non-pharmacologic comfort measures Outcome: Progressing   Problem: Clinical Measurements: Goal: Ability to maintain clinical measurements within normal limits will improve Outcome: Progressing   Problem: Activity: Goal: Risk for activity intolerance will decrease Outcome: Progressing   Problem: Pain Management: Goal: General experience of comfort will improve Outcome: Progressing   Haydee Salter, RN 02/25/23 8:01 PM

## 2023-02-25 NOTE — H&P (Signed)
TOTAL HIP ADMISSION H&P  Patient is admitted for right total hip arthroplasty.  Therapy Plans: HEP Disposition: Home with wife (son & daughter will help too) Planned DVT Prophylaxis: aspirin 81mg  BID DME needed: none PCP: Dr. Tenny Craw - clearance received Cardio: Dr. Mariah Milling - Clearance received TXA: IV Allergies: NKDA Anesthesia Concerns: none (general last time, would prefer spinal) BMI: 24.5 Last HgbA1c: Not diabetic   Other: - Switching from plavix to ASA per cardio - tramadol/oxycodone, robaxin, tylenol    Subjective:  Chief Complaint: right hip pain  HPI: Garrett Moore, 82 y.o. male, has a history of pain and functional disability in the right hip(s) due to arthritis and patient has failed non-surgical conservative treatments for greater than 12 weeks to include NSAID's and/or analgesics and activity modification.  Onset of symptoms was gradual starting 2 years ago with gradually worsening course since that time.The patient noted no past surgery on the right hip(s).  Patient currently rates pain in the right hip at 8 out of 10 with activity. Patient has worsening of pain with activity and weight bearing, pain that interfers with activities of daily living, and pain with passive range of motion. Patient has evidence of joint space narrowing by imaging studies. This condition presents safety issues increasing the risk of falls.  There is no current active infection.  Patient Active Problem List   Diagnosis Date Noted   Carotid stenosis 01/24/2023   Moderate aortic stenosis 05/20/2022   Chronic diastolic CHF (congestive heart failure) (HCC) 05/20/2022   Paroxysmal atrial fibrillation (HCC) 05/20/2022   Vision loss of left eye 05/20/2022   Acute ischemic stroke (HCC) 05/19/2022   Atherosclerosis of native arteries of extremity with intermittent claudication (HCC) 09/28/2021   Aortic valve disease 08/05/2020   Atherosclerotic heart disease of native coronary artery without angina  pectoris 08/05/2020   Basal cell carcinoma of skin 08/05/2020   Benign prostatic hyperplasia 08/05/2020   Stage 3a chronic kidney disease (CKD) (HCC) 08/05/2020   COPD (chronic obstructive pulmonary disease) (HCC) 08/05/2020   Frequent fecal incontinence 08/05/2020   Glaucoma 08/05/2020   Hyperlipidemia 08/05/2020   Other long term (current) drug therapy 08/05/2020   AAA (abdominal aortic aneurysm) (HCC) 07/03/2020   AAA (abdominal aortic aneurysm) without rupture (HCC) 05/26/2020   S/P left THA, AA 02/21/2019   Status post total hip replacement, left 02/21/2019   Past Medical History:  Diagnosis Date   Aneurysm of infrarenal abdominal aorta (HCC) 02/08/2018   a.) TTE 02/08/2018: Ao root 38 mm, asc Ao 40 mm; b.) TTE 06/14/2020; asc Ao 28 mm; c.) CT hematuria 05/10/2020: infrarenal AAA measuring 5.9 cm; d.) s/p EVAR 07/03/2020; e.) CT A/P 10/14/2021: residual aneurysmal sac (s/p EVAR) measured 6.0 cm   Aortic atherosclerosis (HCC)    Aortic valvar stenosis 07/21/2012   a.) TTE 07/21/2012: EF 50-55%, mild AS (AVA = 1.67, MPG 9); b.) TTE 02/08/2018: EF 60-65%; mod AS (MPG 17); c.) TTE 06/14/2020: EF 60-65%, mod AS (MPG 26.8)   Atherosclerosis of native arteries of extremity with intermittent claudication (HCC)    Basal cell carcinoma, face    BPH (benign prostatic hyperplasia)    Carotid artery stenosis 05/21/2006   a.) carotid US 05/21/2006: 0-49% BICA; b.) carotid US 06/10/2020: 1-39% RICA, total occ LICA   CKD (chronic kidney disease), stage III (HCC)    COPD (chronic obstructive pulmonary disease) (HCC)    Coronary artery disease 08/14/1983   a.) MI --> LHC 08/13/2016: CTO of the RCA --> PTCA performed  resulting in 30% residual stenosis; aberrant takeoff of his RCA coming off somewhat high and posteriorly.   Diastolic dysfunction 02/08/2018   a.) TTE 02/08/2018: EF 60-65%, mild MAC,  triv TR, mild-mod AS, G1DD; b.) TTE 06/14/2020: EF 60-65%, mod AS, triv TR, mild AR, G1DD    Diverticulosis    Family history of adverse reaction to anesthesia    a.) extended GA course for mother --> postoperaitve memory problems/dementia   GERD (gastroesophageal reflux disease)    Glaucoma    Heart murmur    History of kidney stones    Hyperlipidemia    Iliac artery aneurysm, bilateral (HCC) 07/03/2020   a.) s/p insertion of BILATERAL iliac artery stents   Myocardial infarction (HCC) 08/13/2016   a.) MI --> LHC 08/13/2016: CTO of the RCA --> PTCA performed resulting in 30% residual stenosis. Procedure complicated by procedural nausea, Patient went into IVR and then AV disaccociated rhythm. SBP dropped (80s). TVP floated with capture, which stabilized patient. ICU admission overnight.   OA (osteoarthritis)    Peripheral vascular disease (HCC)    right leg   Postoperative deep vein thrombosis (DVT) (HCC)    a.) postop age 26 following tonsillectomy (per patient report)    Past Surgical History:  Procedure Laterality Date   CATARACT EXTRACTION, BILATERAL  01/2019   CORONARY ANGIOPLASTY  08/14/1983   Procedure: CORONARY ANGIOPLASTY (PTCA pRCA); Location: [facility]; Surgeon: Al Little, MD   CYSTOSCOPY W/ RETROGRADES  08/12/2020   Procedure: CYSTOSCOPY WITH RETROGRADE PYELOGRAM;  Surgeon: Vanna Scotland, MD;  Location: [facility];  Service: Urology;;   CYSTOSCOPY WITH INSERTION OF UROLIFT N/A 12/22/2021   Procedure: CYSTOSCOPY WITH INSERTION OF UROLIFT;  Surgeon: Vanna Scotland, MD;  Location: [facility];  Service: Urology;  Laterality: N/A;   CYSTOSCOPY/URETEROSCOPY/HOLMIUM LASER/STENT PLACEMENT Left 08/12/2020   Procedure: CYSTOSCOPY/URETEROSCOPY/HOLMIUM LASER/STENT PLACEMENT;  Surgeon: Vanna Scotland, MD;  Location: [facility];  Service: Urology;  Laterality: Left;   ENDOVASCULAR REPAIR/STENT GRAFT N/A 07/03/2020   Procedure: ENDOVASCULAR REPAIR/STENT GRAFT;  Surgeon: Renford Dills, MD;  Location: [facility];  Service: Cardiovascular;  Laterality: N/A;   PELVIC  ANGIOGRAPHY Right 10/14/2021   Procedure: PELVIC ANGIOGRAPHY;  Surgeon: Renford Dills, MD;  Location: [facility];  Service: Cardiovascular;  Laterality: Right;   PERIPHERAL VASCULAR THROMBECTOMY Right 10/14/2021   Procedure: PERIPHERAL VASCULAR THROMBECTOMY;  Surgeon: Renford Dills, MD;  Location: [facility];  Service: Cardiovascular;  Laterality: Right;   ROTATOR CUFF REPAIR Left    TEMPORARY PACEMAKER  08/14/1983   Procedure: TEMPORARY PACEMAKER PLACEMENT; Location: [facility]; Surgeon: Al Little, MD   TONSILLECTOMY     TOTAL HIP ARTHROPLASTY Left 02/21/2019   Procedure: TOTAL HIP ARTHROPLASTY ANTERIOR APPROACH;  Surgeon: Durene Romans, MD;  Location: [facility];  Service: Orthopedics;  Laterality: Left;  70 mins   VITRECTOMY Bilateral     No current facility-administered medications for this encounter.   Current Outpatient Medications  Medication Sig Dispense Refill Last Dose   clopidogrel (PLAVIX) 75 MG tablet Take 1 tablet (75 mg total) by mouth daily. 90 tablet 3    latanoprost (XALATAN) 0.005 % ophthalmic solution Place 1 drop into the right eye at bedtime.      Multiple Vitamin (MULTIVITAMIN WITH MINERALS) TABS tablet Take 1 tablet by mouth daily.      nicotine polacrilex (COMMIT) 2 MG lozenge Take 1 mg by mouth as needed for smoking cessation.      simvastatin (ZOCOR) 80 MG tablet  Take 80 mg by mouth at bedtime.      traMADol (ULTRAM) 50 MG tablet Take 50 mg by mouth daily as needed for moderate pain (pain score 4-6).      Allergies  Allergen Reactions   Quinolones Other (See Comments)    Other reaction(s): Has aortic root dilatation   Sulfa Antibiotics Rash    Social History   Tobacco Use   Smoking status: Former    Current packs/day: 0.00    Average packs/day: 2.0 packs/day for 50.0 years (100.0 ttl pk-yrs)    Types: Cigarettes    Start date: 66    Quit date: 2004    Years since quitting: 20.9   Smokeless tobacco: Never   Tobacco  comments:    quit 17 years ago  Substance Use Topics   Alcohol use: Not Currently    Family History  Problem Relation Age of Onset   Heart Problems Mother    AAA (abdominal aortic aneurysm) Mother      Review of Systems  Constitutional:  Negative for chills and fever.  Respiratory:  Negative for cough and shortness of breath.   Cardiovascular:  Negative for chest pain.  Gastrointestinal:  Negative for nausea and vomiting.  Musculoskeletal:  Positive for arthralgias.     Objective:  Physical Exam Well nourished and well developed. General: Alert and oriented x3, cooperative and pleasant, no acute distress. Head: normocephalic, atraumatic, neck supple. Eyes: EOMI.  Musculoskeletal: Right hip exam: Painful limited hip flexion internal rotation over 5 degrees with pelvic tilting with external rotation close to 30 degrees and stiffness Challenges with hip flexion external rotation and abduction Neuro vas intact distally Left hip exam: Fluid range of motion without reproducible pain Active hip flexion without external rotation contracture  Calves soft and nontender. Motor function intact in LE. Strength 5/5 LE bilaterally. Neuro: Distal pulses 2+. Sensation to light touch intact in LE.  Vital signs in last 24 hours:    Labs:   Estimated body mass index is 24.16 kg/m as calculated from the following:   Height as of 02/12/23: 5\' 11"  (1.803 m).   Weight as of 02/12/23: 78.6 kg.   Imaging Review Plain radiographs demonstrate severe degenerative joint disease of the right hip(s). The bone quality appears to be adequate for age and reported activity level.      Assessment/Plan:  End stage arthritis, right hip(s)  The patient history, physical examination, clinical judgement of the provider and imaging studies are consistent with end stage degenerative joint disease of the right hip(s) and total hip arthroplasty is deemed medically necessary. The treatment options  including medical management, injection therapy, arthroscopy and arthroplasty were discussed at length. The risks and benefits of total hip arthroplasty were presented and reviewed. The risks due to aseptic loosening, infection, stiffness, dislocation/subluxation,  thromboembolic complications and other imponderables were discussed.  The patient acknowledged the explanation, agreed to proceed with the plan and consent was signed. Patient is being admitted for inpatient treatment for surgery, pain control, PT, OT, prophylactic antibiotics, VTE prophylaxis, progressive ambulation and ADL's and discharge planning.The patient is planning to be discharged  home.   Rosalene Billings, PA-C Orthopedic Surgery [facility] [place] [phone]

## 2023-02-25 NOTE — Transfer of Care (Signed)
Immediate Anesthesia Transfer of Care Note  Patient: Garrett Moore  Procedure(s) Performed: TOTAL HIP ARTHROPLASTY ANTERIOR APPROACH (Right: Hip)  Patient Location: PACU  Anesthesia Type:General  Level of Consciousness: awake, alert , and oriented  Airway & Oxygen Therapy: Patient Spontanous Breathing and Patient connected to face mask oxygen  Post-op Assessment: Report given to RN and Post -op Vital signs reviewed and stable  Post vital signs: Reviewed and stable  Last Vitals:  Vitals Value Taken Time  BP 129/58 02/25/23 1236  Temp    Pulse 64 02/25/23 1241  Resp 16 02/25/23 1241  SpO2 100 % 02/25/23 1241  Vitals shown include unfiled device data.  Last Pain:  Vitals:   02/25/23 0849  TempSrc:   PainSc: 4       Patients Stated Pain Goal: 4 (02/25/23 0849)  Complications: No notable events documented.

## 2023-02-25 NOTE — Op Note (Signed)
NAME:  Garrett Moore                ACCOUNT NO.: [id number]      MEDICAL RECORD NO.: [id number]      FACILITY:  [facility]      PHYSICIAN:  Shelda Pal  DATE OF BIRTH:  1941/02/18     DATE OF PROCEDURE:  02/25/2023                                 OPERATIVE REPORT         PREOPERATIVE DIAGNOSIS: Right  hip osteoarthritis.      POSTOPERATIVE DIAGNOSIS:  Right hip osteoarthritis.      PROCEDURE:  Right total hip replacement through an anterior approach   utilizing DePuy THR system, component size 56 mm pinnacle cup, a size 36+4 neutral   Altrex liner, a size 9 Hi Actis stem with a 36+1.5 Articuleze metal head ball.      SURGEON:  Madlyn Frankel. Charlann Boxer, M.D.      ASSISTANT:  Rosalene Billings, PA-C     ANESTHESIA:  Spinal.      SPECIMENS:  None.      COMPLICATIONS:  None.      BLOOD LOSS:  550 cc     DRAINS:  None.      INDICATION OF THE PROCEDURE:  Garrett Moore is a 82 y.o. male who had   presented to office for evaluation of right hip pain.  Radiographs revealed   progressive degenerative changes with bone-on-bone   articulation of the  hip joint, including subchondral cystic changes and osteophytes.  The patient had painful limited range of   motion significantly affecting their overall quality of life and function.  The patient was failing to    respond to conservative measures including medications and/or injections and activity modification and at this point was ready   to proceed with more definitive measures.  Consent was obtained for   benefit of pain relief.  Specific risks of infection, DVT, component   failure, dislocation, neurovascular injury, and need for revision surgery were reviewed in the office as well discussion of   the anterior versus posterior approach were reviewed.     PROCEDURE IN DETAIL:  The patient was brought to operative theater.   Once adequate anesthesia, preoperative antibiotics, 2 gm of Ancef, 1 gm of Tranexamic Acid,  and 10 mg of Decadron were administered, the patient was positioned supine on the [employer] table.  Once the patient was safely positioned with adequate padding of boney prominences we predraped out the hip, and used fluoroscopy to confirm orientation of the pelvis.      The right hip was then prepped and draped from proximal iliac crest to   mid thigh with a shower curtain technique.      Time-out was performed identifying the patient, planned procedure, and the appropriate extremity.     An incision was then made 2 cm lateral to the   anterior superior iliac spine extending over the orientation of the   tensor fascia lata muscle and sharp dissection was carried down to the   fascia of the muscle.      The fascia was then incised.  The muscle belly was identified and swept   laterally and retractor placed along the superior neck.  Following   cauterization of the circumflex vessels and removing some pericapsular  fat, a second cobra retractor was placed on the inferior neck.  A T-capsulotomy was made along the line of the   superior neck to the trochanteric fossa, then extended proximally and   distally.  Tag sutures were placed and the retractors were then placed   intracapsular.  We then identified the trochanteric fossa and   orientation of my neck cut and then made a neck osteotomy with the femur on traction.  The femoral   head was removed without difficulty or complication.  Traction was let   off and retractors were placed posterior and anterior around the   acetabulum.      The labrum and foveal tissue were debrided.  I began reaming with a 48 mm   reamer and reamed up to 55 mm reamer with good bony bed preparation and a 56 mm  cup was chosen.  The final 56 mm Pinnacle cup was then impacted under fluoroscopy to confirm the depth of penetration and orientation with respect to   Abduction and forward flexion.  A screw was placed into the ilium followed by the hole eliminator.  The  final   36+4 neutral Altrex liner was impacted with good visualized rim fit.  The cup was positioned anatomically within the acetabular portion of the pelvis.      At this point, the femur was rolled to 100 degrees.  Further capsule was   released off the inferior aspect of the femoral neck.  I then   released the superior capsule proximally.  With the leg in a neutral position the hook was placed laterally   along the femur under the vastus lateralis origin and elevated manually and then held in position using the hook attachment on the bed.  The leg was then extended and adducted with the leg rolled to 100   degrees of external rotation.  Retractors were placed along the medial calcar and posteriorly over the greater trochanter.  Once the proximal femur was fully   exposed, I used a box osteotome to set orientation.  I then began   broaching with the starting chili pepper broach and passed this by hand and then broached up to 9.  With the 9 broach in place I chose a high offset neck and did several trial reductions.  The offset was appropriate, leg lengths   appeared to be equal best matched with the +1.5 head ball trial confirmed radiographically.   Given these findings, I went ahead and dislocated the hip, repositioned all   retractors and positioned the right hip in the extended and abducted position.  The final 9 Hi Actis stem was   chosen and it was impacted down to the level of neck cut.  Based on this   and the trial reductions, a final 36+1.5 Articuleze metal head ball was chosen and   impacted onto a clean and dry trunnion, and the hip was reduced.  The   hip had been irrigated throughout the case again at this point.  I did   reapproximate the superior capsular leaflet to the anterior leaflet   using #1 Vicryl.  The fascia of the   tensor fascia lata muscle was then reapproximated using #1 Vicryl and #0 Stratafix sutures.  The   remaining wound was closed with 2-0 Vicryl and running  4-0 Monocryl.   The hip was cleaned, dried, and dressed sterilely using Dermabond and   Aquacel dressing.  The patient was then brought   to recovery room  in stable condition tolerating the procedure well.    Rosalene Billings, PA-C was present for the entirety of the case involved from   preoperative positioning, perioperative retractor management, general   facilitation of the case, as well as primary wound closure as assistant.            Madlyn Frankel Charlann Boxer, M.D.        02/25/2023 8:41 AM

## 2023-02-25 NOTE — Discharge Instructions (Signed)

## 2023-02-25 NOTE — Anesthesia Procedure Notes (Signed)
Procedure Name: Intubation Date/Time: 02/25/2023 11:13 AM  Performed by: Hulan Fess, CRNAPre-anesthesia Checklist: Patient identified, Emergency Drugs available, Suction available and Patient being monitored Patient Re-evaluated:Patient Re-evaluated prior to induction Oxygen Delivery Method: Circle System Utilized Preoxygenation: Pre-oxygenation with 100% oxygen Induction Type: IV induction Ventilation: Mask ventilation without difficulty Laryngoscope Size: Glidescope and 4 Tube type: Oral Tube size: 7.5 mm Number of attempts: 1 Airway Equipment and Method: Stylet and Oral airway Placement Confirmation: ETT inserted through vocal cords under direct vision, positive ETCO2 and breath sounds checked- equal and bilateral Secured at: 22 cm Tube secured with: Tape Dental Injury: Teeth and Oropharynx as per pre-operative assessment

## 2023-02-25 NOTE — Anesthesia Postprocedure Evaluation (Signed)
Anesthesia Post Note  Patient: Garrett Moore  Procedure(s) Performed: TOTAL HIP ARTHROPLASTY ANTERIOR APPROACH (Right: Hip)     Patient location during evaluation: PACU Anesthesia Type: General Level of consciousness: awake and alert Pain management: pain level controlled Vital Signs Assessment: post-procedure vital signs reviewed and stable Respiratory status: spontaneous breathing, nonlabored ventilation and respiratory function stable Cardiovascular status: blood pressure returned to baseline and stable Postop Assessment: no apparent nausea or vomiting Anesthetic complications: no   No notable events documented.  Last Vitals:  Vitals:   02/25/23 1315 02/25/23 1330  BP: (!) 128/57 109/64  Pulse: 69 70  Resp: 19 20  Temp:  36.4 C  SpO2: 98% 97%    Last Pain:  Vitals:   02/25/23 1315  TempSrc:   PainSc: 4                  Collene Schlichter

## 2023-02-25 NOTE — Plan of Care (Signed)
  Problem: Clinical Measurements: Goal: Ability to maintain clinical measurements within normal limits will improve Outcome: Progressing   Problem: Elimination: Goal: Will not experience complications related to urinary retention Outcome: Progressing   Problem: Safety: Goal: Ability to remain free from injury will improve Outcome: Progressing   Problem: Education: Goal: Knowledge of the prescribed therapeutic regimen will improve Outcome: Progressing   Problem: Clinical Measurements: Goal: Postoperative complications will be avoided or minimized Outcome: Progressing   Problem: Pain Management: Goal: Pain level will decrease with appropriate interventions Outcome: Progressing

## 2023-02-25 NOTE — Interval H&P Note (Signed)
History and Physical Interval Note:  02/25/2023 8:40 AM  Garrett Moore  has presented today for surgery, with the diagnosis of Right hip osteoaarthtis.  The various methods of treatment have been discussed with the patient and family. After consideration of risks, benefits and other options for treatment, the patient has consented to  Procedure(s): TOTAL HIP ARTHROPLASTY ANTERIOR APPROACH (Right) as a surgical intervention.  The patient's history has been reviewed, patient examined, no change in status, stable for surgery.  I have reviewed the patient's chart and labs.  Questions were answered to the patient's satisfaction.     Shelda Pal

## 2023-02-26 ENCOUNTER — Encounter (HOSPITAL_COMMUNITY): Payer: Self-pay | Admitting: Orthopedic Surgery

## 2023-02-26 DIAGNOSIS — I5032 Chronic diastolic (congestive) heart failure: Secondary | ICD-10-CM | POA: Diagnosis not present

## 2023-02-26 DIAGNOSIS — J449 Chronic obstructive pulmonary disease, unspecified: Secondary | ICD-10-CM | POA: Diagnosis not present

## 2023-02-26 DIAGNOSIS — Z96642 Presence of left artificial hip joint: Secondary | ICD-10-CM | POA: Diagnosis not present

## 2023-02-26 DIAGNOSIS — Z7902 Long term (current) use of antithrombotics/antiplatelets: Secondary | ICD-10-CM | POA: Diagnosis not present

## 2023-02-26 DIAGNOSIS — N1831 Chronic kidney disease, stage 3a: Secondary | ICD-10-CM | POA: Diagnosis not present

## 2023-02-26 DIAGNOSIS — I480 Paroxysmal atrial fibrillation: Secondary | ICD-10-CM | POA: Diagnosis not present

## 2023-02-26 DIAGNOSIS — M1611 Unilateral primary osteoarthritis, right hip: Secondary | ICD-10-CM | POA: Diagnosis not present

## 2023-02-26 DIAGNOSIS — Z85828 Personal history of other malignant neoplasm of skin: Secondary | ICD-10-CM | POA: Diagnosis not present

## 2023-02-26 DIAGNOSIS — Z86718 Personal history of other venous thrombosis and embolism: Secondary | ICD-10-CM | POA: Diagnosis not present

## 2023-02-26 LAB — BASIC METABOLIC PANEL
Anion gap: 8 (ref 5–15)
BUN: 26 mg/dL — ABNORMAL HIGH (ref 8–23)
CO2: 21 mmol/L — ABNORMAL LOW (ref 22–32)
Calcium: 8.1 mg/dL — ABNORMAL LOW (ref 8.9–10.3)
Chloride: 103 mmol/L (ref 98–111)
Creatinine, Ser: 1.39 mg/dL — ABNORMAL HIGH (ref 0.61–1.24)
GFR, Estimated: 51 mL/min — ABNORMAL LOW (ref >60)
Glucose, Bld: 166 mg/dL — ABNORMAL HIGH (ref 70–99)
Potassium: 3.5 mmol/L (ref 3.5–5.1)
Sodium: 132 mmol/L — ABNORMAL LOW (ref 135–145)

## 2023-02-26 LAB — CBC
HCT: 28.7 % — ABNORMAL LOW (ref 39.0–52.0)
Hemoglobin: 9.6 g/dL — ABNORMAL LOW (ref 13.0–17.0)
MCH: 32.4 pg (ref 26.0–34.0)
MCHC: 33.4 g/dL (ref 30.0–36.0)
MCV: 97 fL (ref 80.0–100.0)
Platelets: 236 10*3/uL (ref 150–400)
RBC: 2.96 MIL/uL — ABNORMAL LOW (ref 4.22–5.81)
RDW: 12.8 % (ref 11.5–15.5)
WBC: 21.5 10*3/uL — ABNORMAL HIGH (ref 4.0–10.5)
nRBC: 0 % (ref 0.0–0.2)

## 2023-02-26 MED ORDER — METHOCARBAMOL 500 MG PO TABS: 500.0000 mg | ORAL_TABLET | Freq: Four times a day (QID) | ORAL | 2 refills | Status: DC | PRN

## 2023-02-26 MED ORDER — TRAMADOL HCL 50 MG PO TABS: 50.0000 mg | ORAL_TABLET | Freq: Four times a day (QID) | ORAL | 0 refills | Status: DC | PRN

## 2023-02-26 MED ORDER — CEFADROXIL 500 MG PO CAPS: 500.0000 mg | ORAL_CAPSULE | Freq: Two times a day (BID) | ORAL | 0 refills | Status: AC

## 2023-02-26 MED ORDER — ASPIRIN 81 MG PO CHEW: 81.0000 mg | CHEWABLE_TABLET | Freq: Two times a day (BID) | ORAL | 0 refills | Status: AC

## 2023-02-26 MED ORDER — POLYETHYLENE GLYCOL 3350 17 G PO PACK: 17.0000 g | PACK | Freq: Two times a day (BID) | ORAL | 0 refills | Status: DC

## 2023-02-26 MED ORDER — SENNA 8.6 MG PO TABS: 2.0000 | ORAL_TABLET | Freq: Every day | ORAL | 0 refills | Status: AC

## 2023-02-26 NOTE — Plan of Care (Signed)
Patient discharged home via private vehicle with family. AVS provided and patient verbalized understanding of discharge instructions. Haydee Salter, RN 02/26/23 12:11 PM

## 2023-02-26 NOTE — Evaluation (Signed)
Physical Therapy One Time Evaluation Patient Details Name: Garrett Moore MRN: [id number] DOB: 11-30-1940 Today's Date: 02/26/2023  History of Present Illness  Pt is an 82 year old male s/p R THA direct anterior approach on 02/25/23.  PMHx: CHF, HLD, PAF, smoker, CKD3, COPD, diverticulosis, MI, OA, PVD, DVT, glaucoma, heart murmur, kidney stones, skin CA, atherosclerosis, aneurysm of infrarenal abdominal aorta  Clinical Impression  Patient evaluated by Physical Therapy with no further acute PT needs identified. All education has been completed and the patient has no further questions.  Pt ambulated in hallway and performed LE exercises.  Pt denies having any steps/stairs at home.  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  Pt provided with HEP handout. PT is signing off. Thank you for this referral.         If plan is discharge home, recommend the following: Assistance with cooking/housework;Assist for transportation;Help with stairs or ramp for entrance   Can travel by private vehicle        Equipment Recommendations None recommended by PT  Recommendations for Other Services       Functional Status Assessment Patient has had a recent decline in their functional status and demonstrates the ability to make significant improvements in function in a reasonable and predictable amount of time.     Precautions / Restrictions Precautions Precautions: Fall Restrictions RLE Weight Bearing: Weight bearing as tolerated      Mobility  Bed Mobility Overal bed mobility: Modified Independent                  Transfers Overall transfer level: Needs assistance Equipment used: Rolling walker (2 wheels) Transfers: Sit to/from Stand Sit to Stand: Contact guard assist           General transfer comment: cues for UE and LE positioning    Ambulation/Gait Ambulation/Gait assistance: Contact guard assist Gait Distance (Feet): 180 Feet Assistive  device: Rolling walker (2 wheels) Gait Pattern/deviations: Decreased stance time - right, Step-through pattern, Antalgic       General Gait Details: verbal cues for sequence, RW positioning, step length  Stairs            Wheelchair Mobility     Tilt Bed    Modified Rankin (Stroke Patients Only)       Balance                                             Pertinent Vitals/Pain Pain Assessment Pain Assessment: No/denies pain    Home Living Family/patient expects to be discharged to:: Private residence Living Arrangements: Spouse/significant other Available Help at Discharge: Family;Available 24 hours/day Type of Home: House (townhome) Home Access: Level entry       Home Layout: One level Home Equipment: Agricultural consultant (2 wheels)      Prior Function Prior Level of Function : Independent/Modified Independent               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     Extremity/Trunk Assessment        Lower Extremity Assessment Lower Extremity Assessment: RLE deficits/detail RLE Deficits / Details: anticipated post op hip weakness, able to perform ankle pumps       Communication   Communication Communication: No apparent difficulties  Cognition Arousal: Alert Behavior During Therapy: WFL for tasks assessed/performed Overall Cognitive  Status: Within Functional Limits for tasks assessed                                          General Comments      Exercises Total Joint Exercises Ankle Circles/Pumps: AROM, Both, 10 reps Quad Sets: AROM, Both, 10 reps Heel Slides: AAROM, Right, 10 reps Hip ABduction/ADduction: AAROM, AROM, Supine, Standing, Right, 10 reps Long Arc Quad: AROM, Right, Seated, 10 reps Knee Flexion: AROM, Right, 10 reps, Standing Marching in Standing: AROM, Right, Standing, 10 reps Standing Hip Extension: Right, AROM, Standing, 10 reps   Assessment/Plan    PT Assessment Patient does not need any  further PT services  PT Problem List         PT Treatment Interventions      PT Goals (Current goals can be found in the Care Plan section)  Acute Rehab PT Goals PT Goal Formulation: All assessment and education complete, DC therapy    Frequency       Co-evaluation               AM-PAC PT "6 Clicks" Mobility  Outcome Measure Help needed turning from your back to your side while in a flat bed without using bedrails?: A Little Help needed moving from lying on your back to sitting on the side of a flat bed without using bedrails?: A Little Help needed moving to and from a bed to a chair (including a wheelchair)?: A Little Help needed standing up from a chair using your arms (e.g., wheelchair or bedside chair)?: A Little Help needed to walk in hospital room?: A Little Help needed climbing 3-5 steps with a railing? : A Little 6 Click Score: 18    End of Session Equipment Utilized During Treatment: Gait belt Activity Tolerance: Patient tolerated treatment well Patient left: in chair;with call bell/phone within reach;with chair alarm set   PT Visit Diagnosis: Difficulty in walking, not elsewhere classified (R26.2)    Time: 7564-3329 PT Time Calculation (min) (ACUTE ONLY): 20 min   Charges:   PT Evaluation $PT Eval Low Complexity: 1 Low   PT General Charges $$ ACUTE PT VISIT: 1 Visit       Thomasene Mohair PT, DPT Physical Therapist Acute Rehabilitation Services Office: [phone]   Kati L Payson 02/26/2023, 1:16 PM

## 2023-02-26 NOTE — TOC Transition Note (Signed)
Transition of Care [facility]) - CM/SW Discharge Note   Patient Details  Name: Garrett Moore MRN: [id number] Date of Birth: 08/05/1940  [facility] CM/SW Contact:  Amada Jupiter, LCSW Phone Number: 02/26/2023, 10:01 AM   Clinical Narrative:     Met with pt who confirms he has needed DME in the home and plan for HEP.  No TOC needs.  Final next level of care: Home/Self Care Barriers to Discharge: No Barriers Identified   Patient Goals and CMS Choice      Discharge Placement                         Discharge Plan and Services Additional resources added to the After Visit Summary for                  DME Arranged: N/A DME Agency: NA                  Social Determinants of Health (SDOH) Interventions SDOH Screenings   Food Insecurity: No Food Insecurity (02/25/2023)  Housing: Low Risk  (02/25/2023)  Transportation Needs: No Transportation Needs (02/25/2023)  Utilities: Not At Risk (02/25/2023)  Tobacco Use: Medium Risk (02/25/2023)     Readmission Risk Interventions     No data to display

## 2023-02-26 NOTE — Progress Notes (Signed)
Patient ID: Garrett Moore, male   DOB: 09-17-1940, 82 y.o.   MRN: [id number] Subjective: 1 Day Post-Op Procedure(s) (LRB): TOTAL HIP ARTHROPLASTY ANTERIOR APPROACH (Right)    Patient reports pain as mild. In his home pajamas ready to go! No events noted  Objective:   VITALS:   Vitals:   02/26/23 0121 02/26/23 0638  BP: (!) 118/59 (!) 126/57  Pulse: 81 81  Resp: 16 17  Temp: 98.1 F (36.7 C) 97.7 F (36.5 C)  SpO2: 95% 98%    Neurovascular intact Incision: dressing C/D/I - right hip  LABS Recent Labs    02/26/23 0336  HGB 9.6*  HCT 28.7*  WBC 21.5*  PLT 236    Recent Labs    02/26/23 0336  NA 132*  K 3.5  BUN 26*  CREATININE 1.39*  GLUCOSE 166*    No results for input(s): "LABPT", "INR" in the last 72 hours.   Assessment/Plan: 1 Day Post-Op Procedure(s) (LRB): TOTAL HIP ARTHROPLASTY ANTERIOR APPROACH (Right)   Advance diet Up with therapy Home today after therapy RTC in 2 weeks Rxs will be sent to pharmacy

## 2023-02-28 ENCOUNTER — Other Ambulatory Visit: Payer: Self-pay

## 2023-02-28 ENCOUNTER — Emergency Department
Admission: EM | Admit: 2023-02-28 | Discharge: 2023-02-28 | Disposition: A | Discharge status: Home / Self Care | Payer: Medicare HMO | Attending: Emergency Medicine | Admitting: Emergency Medicine

## 2023-02-28 DIAGNOSIS — R110 Nausea: Secondary | ICD-10-CM

## 2023-02-28 DIAGNOSIS — R000 Tachycardia, unspecified: Secondary | ICD-10-CM | POA: Diagnosis not present

## 2023-02-28 DIAGNOSIS — I10 Essential (primary) hypertension: Secondary | ICD-10-CM | POA: Diagnosis not present

## 2023-02-28 DIAGNOSIS — R112 Nausea with vomiting, unspecified: Secondary | ICD-10-CM | POA: Diagnosis not present

## 2023-02-28 DIAGNOSIS — R42 Dizziness and giddiness: Secondary | ICD-10-CM | POA: Diagnosis not present

## 2023-02-28 DIAGNOSIS — R0689 Other abnormalities of breathing: Secondary | ICD-10-CM | POA: Diagnosis not present

## 2023-02-28 DIAGNOSIS — I959 Hypotension, unspecified: Secondary | ICD-10-CM | POA: Diagnosis not present

## 2023-02-28 LAB — COMPREHENSIVE METABOLIC PANEL
ALT: 14 U/L (ref 0–44)
AST: 32 U/L (ref 15–41)
Albumin: 2.8 g/dL — ABNORMAL LOW (ref 3.5–5.0)
Alkaline Phosphatase: 55 U/L (ref 38–126)
Anion gap: 11 (ref 5–15)
BUN: 39 mg/dL — ABNORMAL HIGH (ref 8–23)
CO2: 23 mmol/L (ref 22–32)
Calcium: 7.9 mg/dL — ABNORMAL LOW (ref 8.9–10.3)
Chloride: 104 mmol/L (ref 98–111)
Creatinine, Ser: 1.19 mg/dL (ref 0.61–1.24)
GFR, Estimated: >60 mL/min (ref >60)
Glucose, Bld: 131 mg/dL — ABNORMAL HIGH (ref 70–99)
Potassium: 3.8 mmol/L (ref 3.5–5.1)
Sodium: 138 mmol/L (ref 135–145)
Total Bilirubin: 0.9 mg/dL (ref <1.2)
Total Protein: 6.2 g/dL — ABNORMAL LOW (ref 6.5–8.1)

## 2023-02-28 LAB — LACTIC ACID, PLASMA: Lactic Acid, Venous: 1.6 mmol/L (ref 0.5–1.9)

## 2023-02-28 LAB — CBC
HCT: 25.7 % — ABNORMAL LOW (ref 39.0–52.0)
Hemoglobin: 8.6 g/dL — ABNORMAL LOW (ref 13.0–17.0)
MCH: 31.9 pg (ref 26.0–34.0)
MCHC: 33.5 g/dL (ref 30.0–36.0)
MCV: 95.2 fL (ref 80.0–100.0)
Platelets: 284 10*3/uL (ref 150–400)
RBC: 2.7 MIL/uL — ABNORMAL LOW (ref 4.22–5.81)
RDW: 13.2 % (ref 11.5–15.5)
WBC: 17.6 10*3/uL — ABNORMAL HIGH (ref 4.0–10.5)
nRBC: 0 % (ref 0.0–0.2)

## 2023-02-28 LAB — LIPASE, BLOOD: Lipase: 30 U/L (ref 11–51)

## 2023-02-28 MED ORDER — METOCLOPRAMIDE HCL 5 MG PO TABS: 5.0000 mg | ORAL_TABLET | Freq: Four times a day (QID) | ORAL | 1 refills | Status: DC | PRN

## 2023-02-28 MED ORDER — ONDANSETRON 4 MG PO TBDP
4.0000 mg | ORAL_TABLET | Freq: Once | ORAL | Status: AC
  Administered 2023-02-28: 4 mg via ORAL
  Filled 2023-02-28: qty 1

## 2023-02-28 MED ORDER — FENTANYL CITRATE PF 50 MCG/ML IJ SOSY
50.0000 ug | PREFILLED_SYRINGE | Freq: Once | INTRAMUSCULAR | Status: AC
  Administered 2023-02-28: 50 ug via INTRAVENOUS
  Filled 2023-02-28: qty 1

## 2023-02-28 MED ORDER — SODIUM CHLORIDE 0.9 % IV BOLUS
500.0000 mL | Freq: Once | INTRAVENOUS | Status: AC
  Administered 2023-02-28: 500 mL via INTRAVENOUS

## 2023-02-28 NOTE — ED Notes (Signed)
EDP at bedside. Pt also stating he has been weak. Pt states not really vomiting, more spitting up.

## 2023-02-28 NOTE — ED Notes (Addendum)
Patient appears to be resting comfortably in the hallway. Patient adamantly denies pain but family states he will deny pain

## 2023-02-28 NOTE — ED Notes (Signed)
Patient able to ambulate with walker without difficulty, dizziness or nausea

## 2023-02-28 NOTE — Discharge Instructions (Signed)
Please seek medical attention for any high fevers, chest pain, shortness of breath, change in behavior, persistent vomiting, bloody stool or any other new or concerning symptoms.  

## 2023-02-28 NOTE — ED Triage Notes (Signed)
pt to ED from home, [facility] hip replacement on THur, since then family states pt having N/V/dizziness/poor appetite and "low grade fevers" unknown what temp was taking abx, not sure which. family believes abx is causing N/V  EMS VS: 98/60 a fib RVR 150-160, hx a fib ETCO2 23 RR 24 CBG 147  Skin dry, pt alert and oriented

## 2023-02-28 NOTE — ED Provider Notes (Signed)
[facility] Provider Note    Event Date/Time   First MD Initiated Contact with Patient 02/28/23 1704     (approximate)   History   Nausea and Emesis   HPI  Garrett Moore is a 82 y.o. male who presents to the emergency department today because of concerns for nausea and vomiting.  Patient states that his symptoms started after he was placed on antibiotics after recent hip surgery.  He thinks that his nausea and vomiting is coming from the antibiotics.  He denies any associated abdominal pain.  Apparently family thought he felt hot earlier today but he denies any fevers.      Physical Exam   Triage Vital Signs: ED Triage Vitals  Encounter Vitals Group     BP 02/28/23 1709 119/66     Systolic BP Percentile --      Diastolic BP Percentile --      Pulse Rate 02/28/23 1709 80     Resp 02/28/23 1709 20     Temp 02/28/23 1709 97.8 F (36.6 C)     Temp Source 02/28/23 1709 Oral     SpO2 02/28/23 1709 92 %     Weight 02/28/23 1707 170 lb (77.1 kg)     Height 02/28/23 1707 5\' 11"  (1.803 m)     Head Circumference --      Peak Flow --      Pain Score 02/28/23 1708 0     Pain Loc --      Pain Education --      Exclude from Growth Chart --     Most recent vital signs: Vitals:   02/28/23 1709  BP: 119/66  Pulse: 80  Resp: 20  Temp: 97.8 F (36.6 C)  SpO2: 92%   General: Awake, alert, oriented. CV:  Good peripheral perfusion. Regular rate and rhythm. Resp:  Normal effort. Lungs clear. Abd:  No distention. Non tender.   ED Results / Procedures / Treatments   Labs (all labs ordered are listed, but only abnormal results are displayed) Labs Reviewed  COMPREHENSIVE METABOLIC PANEL - Abnormal; Notable for the following components:      Result Value   Glucose, Bld 131 (*)    BUN 39 (*)    Calcium 7.9 (*)    Total Protein 6.2 (*)    Albumin 2.8 (*)    All other components within normal limits  CBC - Abnormal; Notable for the following  components:   WBC 17.6 (*)    RBC 2.70 (*)    Hemoglobin 8.6 (*)    HCT 25.7 (*)    All other components within normal limits  LIPASE, BLOOD  LACTIC ACID, PLASMA  URINALYSIS, ROUTINE W REFLEX MICROSCOPIC  LACTIC ACID, PLASMA     EKG I, Phineas Semen, attending physician, personally viewed and interpreted this EKG  EKG Time: 1711 Rate: 116 Rhythm: atrial fibrillation Axis: normal Intervals: qtc 450 QRS: narrow ST changes: no st elevation Impression: abnormal ekg   RADIOLOGY None  PROCEDURES:  Critical Care performed: No   MEDICATIONS ORDERED IN ED: Medications  ondansetron (ZOFRAN-ODT) disintegrating tablet 4 mg (has no administration in time range)     IMPRESSION / MDM / ASSESSMENT AND PLAN / ED COURSE  I reviewed the triage vital signs and the nursing notes.                              Differential  diagnosis includes, but is not limited to, medication side effect, pancreatitis, infection  Patient's presentation is most consistent with acute presentation with potential threat to life or bodily function.  Patient presented to the emergency department today because of concerns for nausea.  Blood work did show leukocytosis although this is improved over blood work done 2 days ago.  No concerning electrolyte abnormalities.  Patient was given fluids here.  He was able to tolerate p.o. PR and he was able to get up and walk without any difficulty.  At this time think is reasonable for patient to be discharged home.  Patient wanted to try another prescription for nausea so we will give patient prescription for Reglan.      FINAL CLINICAL IMPRESSION(S) / ED DIAGNOSES   Final diagnoses:  Nausea    Note:  This document was prepared using Dragon voice recognition software and may include unintentional dictation errors.    Phineas Semen, MD 02/28/23 2053

## 2023-03-02 DIAGNOSIS — R110 Nausea: Secondary | ICD-10-CM | POA: Diagnosis not present

## 2023-03-04 DIAGNOSIS — T1491XA Suicide attempt, initial encounter: Secondary | ICD-10-CM | POA: Diagnosis not present

## 2023-03-04 DIAGNOSIS — K922 Gastrointestinal hemorrhage, unspecified: Secondary | ICD-10-CM | POA: Diagnosis not present

## 2023-03-04 DIAGNOSIS — D649 Anemia, unspecified: Secondary | ICD-10-CM | POA: Diagnosis not present

## 2023-03-04 DIAGNOSIS — R410 Disorientation, unspecified: Secondary | ICD-10-CM | POA: Diagnosis not present

## 2023-03-04 DIAGNOSIS — G479 Sleep disorder, unspecified: Secondary | ICD-10-CM | POA: Diagnosis not present

## 2023-03-04 DIAGNOSIS — Z09 Encounter for follow-up examination after completed treatment for conditions other than malignant neoplasm: Secondary | ICD-10-CM | POA: Diagnosis not present

## 2023-03-04 DIAGNOSIS — E778 Other disorders of glycoprotein metabolism: Secondary | ICD-10-CM | POA: Diagnosis not present

## 2023-03-04 DIAGNOSIS — Z6823 Body mass index (BMI) 23.0-23.9, adult: Secondary | ICD-10-CM | POA: Diagnosis not present

## 2023-03-08 ENCOUNTER — Inpatient Hospital Stay
Admission: EM | Admit: 2023-03-08 | Discharge: 2023-03-10 | DRG: 381 | Disposition: A | Discharge status: Hospice | Payer: Medicare HMO | Attending: Internal Medicine | Admitting: Internal Medicine

## 2023-03-08 ENCOUNTER — Emergency Department: Payer: Medicare HMO

## 2023-03-08 ENCOUNTER — Encounter: Payer: Self-pay | Admitting: Internal Medicine

## 2023-03-08 ENCOUNTER — Other Ambulatory Visit: Payer: Self-pay

## 2023-03-08 DIAGNOSIS — I5032 Chronic diastolic (congestive) heart failure: Secondary | ICD-10-CM | POA: Diagnosis present

## 2023-03-08 DIAGNOSIS — I252 Old myocardial infarction: Secondary | ICD-10-CM

## 2023-03-08 DIAGNOSIS — Z7982 Long term (current) use of aspirin: Secondary | ICD-10-CM

## 2023-03-08 DIAGNOSIS — J449 Chronic obstructive pulmonary disease, unspecified: Secondary | ICD-10-CM | POA: Diagnosis present

## 2023-03-08 DIAGNOSIS — Z9861 Coronary angioplasty status: Secondary | ICD-10-CM | POA: Diagnosis not present

## 2023-03-08 DIAGNOSIS — Z66 Do not resuscitate: Secondary | ICD-10-CM | POA: Diagnosis not present

## 2023-03-08 DIAGNOSIS — Z85828 Personal history of other malignant neoplasm of skin: Secondary | ICD-10-CM

## 2023-03-08 DIAGNOSIS — I350 Nonrheumatic aortic (valve) stenosis: Secondary | ICD-10-CM | POA: Diagnosis present

## 2023-03-08 DIAGNOSIS — N1831 Chronic kidney disease, stage 3a: Secondary | ICD-10-CM | POA: Diagnosis present

## 2023-03-08 DIAGNOSIS — Z87442 Personal history of urinary calculi: Secondary | ICD-10-CM

## 2023-03-08 DIAGNOSIS — H409 Unspecified glaucoma: Secondary | ICD-10-CM | POA: Diagnosis present

## 2023-03-08 DIAGNOSIS — Z96643 Presence of artificial hip joint, bilateral: Secondary | ICD-10-CM | POA: Diagnosis present

## 2023-03-08 DIAGNOSIS — E785 Hyperlipidemia, unspecified: Secondary | ICD-10-CM | POA: Diagnosis present

## 2023-03-08 DIAGNOSIS — K5750 Diverticulosis of both small and large intestine without perforation or abscess without bleeding: Secondary | ICD-10-CM | POA: Diagnosis not present

## 2023-03-08 DIAGNOSIS — N400 Enlarged prostate without lower urinary tract symptoms: Secondary | ICD-10-CM | POA: Diagnosis present

## 2023-03-08 DIAGNOSIS — I480 Paroxysmal atrial fibrillation: Secondary | ICD-10-CM | POA: Diagnosis present

## 2023-03-08 DIAGNOSIS — I2489 Other forms of acute ischemic heart disease: Secondary | ICD-10-CM

## 2023-03-08 DIAGNOSIS — Z96642 Presence of left artificial hip joint: Secondary | ICD-10-CM

## 2023-03-08 DIAGNOSIS — Z9842 Cataract extraction status, left eye: Secondary | ICD-10-CM

## 2023-03-08 DIAGNOSIS — Z79899 Other long term (current) drug therapy: Secondary | ICD-10-CM

## 2023-03-08 DIAGNOSIS — N281 Cyst of kidney, acquired: Secondary | ICD-10-CM | POA: Diagnosis not present

## 2023-03-08 DIAGNOSIS — D62 Acute posthemorrhagic anemia: Secondary | ICD-10-CM | POA: Diagnosis present

## 2023-03-08 DIAGNOSIS — Z882 Allergy status to sulfonamides status: Secondary | ICD-10-CM

## 2023-03-08 DIAGNOSIS — K2100 Gastro-esophageal reflux disease with esophagitis, without bleeding: Secondary | ICD-10-CM | POA: Diagnosis present

## 2023-03-08 DIAGNOSIS — I2510 Atherosclerotic heart disease of native coronary artery without angina pectoris: Secondary | ICD-10-CM | POA: Diagnosis not present

## 2023-03-08 DIAGNOSIS — R011 Cardiac murmur, unspecified: Secondary | ICD-10-CM | POA: Diagnosis present

## 2023-03-08 DIAGNOSIS — K769 Liver disease, unspecified: Secondary | ICD-10-CM | POA: Diagnosis present

## 2023-03-08 DIAGNOSIS — Z1152 Encounter for screening for COVID-19: Secondary | ICD-10-CM

## 2023-03-08 DIAGNOSIS — K2210 Ulcer of esophagus without bleeding: Secondary | ICD-10-CM

## 2023-03-08 DIAGNOSIS — I959 Hypotension, unspecified: Secondary | ICD-10-CM | POA: Diagnosis present

## 2023-03-08 DIAGNOSIS — J439 Emphysema, unspecified: Secondary | ICD-10-CM | POA: Diagnosis not present

## 2023-03-08 DIAGNOSIS — Z8673 Personal history of transient ischemic attack (TIA), and cerebral infarction without residual deficits: Secondary | ICD-10-CM

## 2023-03-08 DIAGNOSIS — K921 Melena: Secondary | ICD-10-CM

## 2023-03-08 DIAGNOSIS — K2211 Ulcer of esophagus with bleeding: Principal | ICD-10-CM | POA: Diagnosis present

## 2023-03-08 DIAGNOSIS — I10 Essential (primary) hypertension: Secondary | ICD-10-CM | POA: Diagnosis not present

## 2023-03-08 DIAGNOSIS — Z9841 Cataract extraction status, right eye: Secondary | ICD-10-CM

## 2023-03-08 DIAGNOSIS — D649 Anemia, unspecified: Secondary | ICD-10-CM

## 2023-03-08 DIAGNOSIS — Z955 Presence of coronary angioplasty implant and graft: Secondary | ICD-10-CM | POA: Diagnosis not present

## 2023-03-08 DIAGNOSIS — I719 Aortic aneurysm of unspecified site, without rupture: Secondary | ICD-10-CM | POA: Diagnosis not present

## 2023-03-08 DIAGNOSIS — K208 Other esophagitis without bleeding: Secondary | ICD-10-CM | POA: Diagnosis not present

## 2023-03-08 DIAGNOSIS — I739 Peripheral vascular disease, unspecified: Secondary | ICD-10-CM | POA: Diagnosis not present

## 2023-03-08 DIAGNOSIS — K922 Gastrointestinal hemorrhage, unspecified: Principal | ICD-10-CM | POA: Diagnosis present

## 2023-03-08 DIAGNOSIS — I7143 Infrarenal abdominal aortic aneurysm, without rupture: Secondary | ICD-10-CM | POA: Diagnosis not present

## 2023-03-08 DIAGNOSIS — R918 Other nonspecific abnormal finding of lung field: Secondary | ICD-10-CM | POA: Diagnosis not present

## 2023-03-08 DIAGNOSIS — Z888 Allergy status to other drugs, medicaments and biological substances status: Secondary | ICD-10-CM

## 2023-03-08 DIAGNOSIS — Z87891 Personal history of nicotine dependence: Secondary | ICD-10-CM

## 2023-03-08 LAB — CBC WITH DIFFERENTIAL/PLATELET
Abs Immature Granulocytes: 0.19 10*3/uL — ABNORMAL HIGH (ref 0.00–0.07)
Basophils Absolute: 0 10*3/uL (ref 0.0–0.1)
Basophils Relative: 0 %
Eosinophils Absolute: 0.1 10*3/uL (ref 0.0–0.5)
Eosinophils Relative: 1 %
HCT: 26.4 % — ABNORMAL LOW (ref 39.0–52.0)
Hemoglobin: 8.4 g/dL — ABNORMAL LOW (ref 13.0–17.0)
Immature Granulocytes: 1 %
Lymphocytes Relative: 7 %
Lymphs Abs: 1 10*3/uL (ref 0.7–4.0)
MCH: 32.2 pg (ref 26.0–34.0)
MCHC: 31.8 g/dL (ref 30.0–36.0)
MCV: 101.1 fL — ABNORMAL HIGH (ref 80.0–100.0)
Monocytes Absolute: 1.8 10*3/uL — ABNORMAL HIGH (ref 0.1–1.0)
Monocytes Relative: 13 %
Neutro Abs: 10.8 10*3/uL — ABNORMAL HIGH (ref 1.7–7.7)
Neutrophils Relative %: 78 %
Platelets: 435 10*3/uL — ABNORMAL HIGH (ref 150–400)
RBC: 2.61 MIL/uL — ABNORMAL LOW (ref 4.22–5.81)
RDW: 14.1 % (ref 11.5–15.5)
WBC: 13.9 10*3/uL — ABNORMAL HIGH (ref 4.0–10.5)
nRBC: 0 % (ref 0.0–0.2)

## 2023-03-08 LAB — CBC
HCT: 25.4 % — ABNORMAL LOW (ref 39.0–52.0)
Hemoglobin: 8.6 g/dL — ABNORMAL LOW (ref 13.0–17.0)
MCH: 32 pg (ref 26.0–34.0)
MCHC: 33.9 g/dL (ref 30.0–36.0)
MCV: 94.4 fL (ref 80.0–100.0)
Platelets: 377 10*3/uL (ref 150–400)
RBC: 2.69 MIL/uL — ABNORMAL LOW (ref 4.22–5.81)
RDW: 14.6 % (ref 11.5–15.5)
WBC: 10.6 10*3/uL — ABNORMAL HIGH (ref 4.0–10.5)
nRBC: 0 % (ref 0.0–0.2)

## 2023-03-08 LAB — LACTIC ACID, PLASMA
Lactic Acid, Venous: 2 mmol/L (ref 0.5–1.9)
Lactic Acid, Venous: 1.2 mmol/L (ref 0.5–1.9)

## 2023-03-08 LAB — TROPONIN I (HIGH SENSITIVITY)
Troponin I (High Sensitivity): 38 ng/L — ABNORMAL HIGH (ref <18)
Troponin I (High Sensitivity): 40 ng/L — ABNORMAL HIGH (ref <18)

## 2023-03-08 LAB — COMPREHENSIVE METABOLIC PANEL
ALT: 29 U/L (ref 0–44)
AST: 29 U/L (ref 15–41)
Albumin: 2.6 g/dL — ABNORMAL LOW (ref 3.5–5.0)
Alkaline Phosphatase: 80 U/L (ref 38–126)
Anion gap: 9 (ref 5–15)
BUN: 16 mg/dL (ref 8–23)
CO2: 23 mmol/L (ref 22–32)
Calcium: 7.7 mg/dL — ABNORMAL LOW (ref 8.9–10.3)
Chloride: 101 mmol/L (ref 98–111)
Creatinine, Ser: 1.25 mg/dL — ABNORMAL HIGH (ref 0.61–1.24)
GFR, Estimated: 58 mL/min — ABNORMAL LOW (ref >60)
Glucose, Bld: 119 mg/dL — ABNORMAL HIGH (ref 70–99)
Potassium: 3.2 mmol/L — ABNORMAL LOW (ref 3.5–5.1)
Sodium: 133 mmol/L — ABNORMAL LOW (ref 135–145)
Total Bilirubin: 0.6 mg/dL (ref <1.2)
Total Protein: 5.8 g/dL — ABNORMAL LOW (ref 6.5–8.1)

## 2023-03-08 LAB — FOLATE: Folate: 18.5 ng/mL (ref >5.9)

## 2023-03-08 LAB — IRON AND TIBC
Iron: 22 ug/dL — ABNORMAL LOW (ref 45–182)
Saturation Ratios: 11 % — ABNORMAL LOW (ref 17.9–39.5)
TIBC: 200 ug/dL — ABNORMAL LOW (ref 250–450)
UIBC: 178 ug/dL

## 2023-03-08 LAB — RESP PANEL BY RT-PCR (RSV, FLU A&B, COVID)  RVPGX2
Influenza A by PCR: NEGATIVE
Influenza B by PCR: NEGATIVE
Resp Syncytial Virus by PCR: NEGATIVE
SARS Coronavirus 2 by RT PCR: NEGATIVE

## 2023-03-08 LAB — PROTIME-INR
INR: 1.1 (ref 0.8–1.2)
Prothrombin Time: 14.6 s (ref 11.4–15.2)

## 2023-03-08 LAB — VITAMIN B12: Vitamin B-12: 562 pg/mL (ref 180–914)

## 2023-03-08 LAB — PREPARE RBC (CROSSMATCH)

## 2023-03-08 LAB — FERRITIN: Ferritin: 209 ng/mL (ref 24–336)

## 2023-03-08 MED ORDER — TRAMADOL HCL 50 MG PO TABS
50.0000 mg | ORAL_TABLET | Freq: Four times a day (QID) | ORAL | Status: DC | PRN
  Administered 2023-03-08: 50 mg via ORAL
  Filled 2023-03-08: qty 1

## 2023-03-08 MED ORDER — LATANOPROST 0.005 % OP SOLN
1.0000 [drp] | Freq: Every day | OPHTHALMIC | Status: DC
  Filled 2023-03-08 (×2): qty 2.5

## 2023-03-08 MED ORDER — SODIUM CHLORIDE 0.9% FLUSH
3.0000 mL | Freq: Two times a day (BID) | INTRAVENOUS | Status: DC
  Administered 2023-03-09 (×2): 3 mL via INTRAVENOUS

## 2023-03-08 MED ORDER — POLYETHYLENE GLYCOL 3350 17 G PO PACK: 17.0000 g | PACK | Freq: Every day | ORAL | Status: DC | PRN

## 2023-03-08 MED ORDER — SODIUM CHLORIDE 0.9% IV SOLUTION
Freq: Once | INTRAVENOUS | Status: AC
  Filled 2023-03-08: qty 250

## 2023-03-08 MED ORDER — IOHEXOL 300 MG/ML  SOLN
100.0000 mL | Freq: Once | INTRAMUSCULAR | Status: AC | PRN
  Administered 2023-03-08: 100 mL via INTRAVENOUS

## 2023-03-08 MED ORDER — ONDANSETRON HCL 4 MG/2ML IJ SOLN: 4.0000 mg | Freq: Four times a day (QID) | INTRAMUSCULAR | Status: DC | PRN

## 2023-03-08 MED ORDER — ACETAMINOPHEN 650 MG RE SUPP: 650.0000 mg | Freq: Four times a day (QID) | RECTAL | Status: DC | PRN

## 2023-03-08 MED ORDER — POTASSIUM CHLORIDE CRYS ER 20 MEQ PO TBCR
40.0000 meq | EXTENDED_RELEASE_TABLET | Freq: Once | ORAL | Status: AC
  Administered 2023-03-08: 40 meq via ORAL
  Filled 2023-03-08: qty 2

## 2023-03-08 MED ORDER — ACETAMINOPHEN 325 MG PO TABS: 650.0000 mg | ORAL_TABLET | Freq: Four times a day (QID) | ORAL | Status: DC | PRN

## 2023-03-08 MED ORDER — ONDANSETRON HCL 4 MG PO TABS: 4.0000 mg | ORAL_TABLET | Freq: Four times a day (QID) | ORAL | Status: DC | PRN

## 2023-03-08 MED ORDER — PANTOPRAZOLE SODIUM 40 MG IV SOLR
40.0000 mg | Freq: Two times a day (BID) | INTRAVENOUS | Status: DC
  Administered 2023-03-08 – 2023-03-09 (×3): 40 mg via INTRAVENOUS
  Filled 2023-03-08 (×4): qty 10

## 2023-03-08 MED ORDER — PANTOPRAZOLE SODIUM 40 MG IV SOLR
80.0000 mg | Freq: Once | INTRAVENOUS | Status: AC
  Administered 2023-03-08: 80 mg via INTRAVENOUS
  Filled 2023-03-08: qty 20

## 2023-03-08 MED ORDER — METHOCARBAMOL 500 MG PO TABS: 500.0000 mg | ORAL_TABLET | Freq: Four times a day (QID) | ORAL | Status: DC | PRN

## 2023-03-08 MED ORDER — IOHEXOL 350 MG/ML SOLN
50.0000 mL | Freq: Once | INTRAVENOUS | Status: AC | PRN
  Administered 2023-03-08: 50 mL via INTRAVENOUS

## 2023-03-08 MED ORDER — SODIUM CHLORIDE 0.9 % IV BOLUS
1000.0000 mL | Freq: Once | INTRAVENOUS | Status: AC
  Administered 2023-03-08: 1000 mL via INTRAVENOUS

## 2023-03-08 NOTE — ED Provider Notes (Signed)
[facility] Provider Note    Event Date/Time   First MD Initiated Contact with Patient 03/08/23 1153     (approximate)   History   Chief Complaint: Hypotension   HPI  Garrett Moore is a 82 y.o. male with a history of CKD, aortic aneurysm, atrial fibrillation, COPD, CAD with recent right total hip arthroplasty on 02/25/2023 who comes to the ED with gradually worsening fatigue, dizziness with standing, shortness of breath with walking.  Also had black tarry stool for the past week.  Went to outpatient doctor's office today where blood pressure was found to be low and patient was sent to the ED.  Patient denies hematemesis          Physical Exam   Triage Vital Signs: ED Triage Vitals  Encounter Vitals Group     BP 03/08/23 1123 (!) 110/51     Systolic BP Percentile --      Diastolic BP Percentile --      Pulse Rate 03/08/23 1123 77     Resp 03/08/23 1123 20     Temp 03/08/23 1123 (!) 97.5 F (36.4 C)     Temp Source 03/08/23 1123 Oral     SpO2 03/08/23 1125 100 %     Weight 03/08/23 1135 169 lb (76.7 kg)     Height 03/08/23 1135 5\' 11"  (1.803 m)     Head Circumference --      Peak Flow --      Pain Score 03/08/23 1123 0     Pain Loc --      Pain Education --      Exclude from Growth Chart --     Most recent vital signs: Vitals:   03/08/23 1123 03/08/23 1125  BP: (!) 110/51   Pulse: 77   Resp: 20   Temp: (!) 97.5 F (36.4 C)   SpO2:  100%    General: Awake, no distress.  CV:  Good peripheral perfusion.  Regular rate and rhythm.  Systolic murmur. Resp:  Normal effort.  Clear to auscultation bilaterally Abd:  No distention.  Soft, left upper quadrant tenderness without peritoneal signs.  Rectal exam reveals black melanotic stool, strongly Hemoccult positive Other:  No rash, no lower extremity edema   ED Results / Procedures / Treatments   Labs (all labs ordered are listed, but only abnormal results are displayed) Labs Reviewed   COMPREHENSIVE METABOLIC PANEL - Abnormal; Notable for the following components:      Result Value   Sodium 133 (*)    Potassium 3.2 (*)    Glucose, Bld 119 (*)    Creatinine, Ser 1.25 (*)    Calcium 7.7 (*)    Total Protein 5.8 (*)    Albumin 2.6 (*)    GFR, Estimated 58 (*)    All other components within normal limits  LACTIC ACID, PLASMA - Abnormal; Notable for the following components:   Lactic Acid, Venous 2.0 (*)    All other components within normal limits  CBC WITH DIFFERENTIAL/PLATELET - Abnormal; Notable for the following components:   WBC 13.9 (*)    RBC 2.61 (*)    Hemoglobin 8.4 (*)    HCT 26.4 (*)    MCV 101.1 (*)    Platelets 435 (*)    Neutro Abs 10.8 (*)    Monocytes Absolute 1.8 (*)    Abs Immature Granulocytes 0.19 (*)    All other components within normal limits  TROPONIN I (HIGH  SENSITIVITY) - Abnormal; Notable for the following components:   Troponin I (High Sensitivity) 38 (*)    All other components within normal limits  RESP PANEL BY RT-PCR (RSV, FLU A&B, COVID)  [id number]  PROTIME-INR  LACTIC ACID, PLASMA  URINALYSIS, ROUTINE W REFLEX MICROSCOPIC  FERRITIN  IRON AND TIBC  VITAMIN B12  FOLATE  TYPE AND SCREEN  TROPONIN I (HIGH SENSITIVITY)     EKG Interpreted by me Normal sinus rhythm rate of 81.  Normal axis, normal intervals.  Normal QRS ST segments and T waves   RADIOLOGY CT abdomen pelvis interpreted by me, negative for pneumoperitoneum.  Radiology report reviewed   PROCEDURES:  Procedures   MEDICATIONS ORDERED IN ED: Medications  pantoprazole (PROTONIX) injection 40 mg (has no administration in time range)  pantoprazole (PROTONIX) injection 80 mg (80 mg Intravenous Given 03/08/23 1320)  sodium chloride 0.9 % bolus 1,000 mL (1,000 mLs Intravenous New Bag/Given 03/08/23 1321)  iohexol (OMNIPAQUE) 300 MG/ML solution 100 mL (100 mLs Intravenous Contrast Given 03/08/23 1244)  iohexol (OMNIPAQUE) 350 MG/ML injection 50 mL (50 mLs  Intravenous Contrast Given 03/08/23 1300)     IMPRESSION / MDM / ASSESSMENT AND PLAN / ED COURSE  I reviewed the triage vital signs and the nursing notes.  DDx: Upper GI bleed, anemia, AKI, electrolyte abnormality COVID, influenza, thrombocytopenia, GI perforation, pulmonary embolism, COVID, influenza  Patient's presentation is most consistent with acute presentation with potential threat to life or bodily function.  Patient presents with worsening dizziness with standing, shortness of breath with walking, feeling cold and fatigued.  Had low blood pressure earlier today in an outpatient office visit.  Currently while supine feels better.  Exam concerning for upper GI bleed, possibly stress ulcer with recent surgery.  Hemoglobin 8.4 downtrending from a baseline of 12 3 weeks ago.  Will give IV fluids, need to hospitalize for further monitoring and GI evaluation.   ----------------------------------------- 1:34 PM on 03/08/2023 ----------------------------------------- Case discussed with hospitalist for further management      FINAL CLINICAL IMPRESSION(S) / ED DIAGNOSES   Final diagnoses:  Upper GI bleed     Rx / DC Orders   ED Discharge Orders     None        Note:  This document was prepared using Dragon voice recognition software and may include unintentional dictation errors.   Sharman Cheek, MD 03/08/23 [phone]

## 2023-03-08 NOTE — Assessment & Plan Note (Signed)
Patient states he has had only 2 episodes of atrial fibrillation in the past, due to this, he has declined any anticoagulant use.  EKG demonstrating sinus rhythm.

## 2023-03-08 NOTE — Assessment & Plan Note (Signed)
History of CKD stage IIIa with creatinine ranging between 1.06 and 1.50 in the last several years.  Currently within baseline range.  -Repeat BMP in a.m. given he received IV contrast today

## 2023-03-08 NOTE — Assessment & Plan Note (Signed)
Previous history of HFpEF, however most recent echocardiogram noted EF of 60-65% no evidence of diastolic dysfunction.  Patient is not on any diuretics at home.  He appears euvolemic at this time.

## 2023-03-08 NOTE — Assessment & Plan Note (Addendum)
Patient is presenting with 10 days of melena in addition to 3 days of hematemesis that resolved consistent with upper GI bleed.  He is currently on a baby aspirin only, as he discontinued Plavix 1 week prior to hip surgery.  No other NSAID use.  - GI consulted; appreciate their recommendations - Clear liquid diet - N.p.o. after midnight - Protonix 40 mg IV twice daily - CT of the abdomen ordered in the ED pending

## 2023-03-08 NOTE — Assessment & Plan Note (Signed)
Recent total hip replacement on 12/5 with initial improvement in pain, however patient states pain has worsened over the last few days.  - Tramadol as needed - PT/OT - Will message orthopedic surgery to see if worsening in pain is expected

## 2023-03-08 NOTE — Assessment & Plan Note (Signed)
Minimally elevated troponin with no active chest pain and reassuring EKG.  Likely due to demand in the setting of acute blood loss anemia.  - Repeat troponin pending

## 2023-03-08 NOTE — ED Provider Triage Note (Signed)
Emergency Medicine Provider Triage Evaluation Note  Garrett Moore , a 82 y.o. male  was evaluated in triage.  Pt complains of weakness, shortness of breath, recent hip surgery.  Review of Systems  Positive:  Negative:   Physical Exam  There were no vitals taken for this visit. Gen:   Awake, no distress   Resp:  Normal effort  MSK:   Moves extremities without difficulty  Other:    Medical Decision Making  Medically screening exam initiated at 11:24 AM.  Appropriate orders placed.  ANITA BOCOOK was informed that the remainder of the evaluation will be completed by another provider, this initial triage assessment does not replace that evaluation, and the importance of remaining in the ED until their evaluation is complete.     Faythe Ghee, PA-C 03/08/23 1125

## 2023-03-08 NOTE — Assessment & Plan Note (Signed)
CAD s/p DES to RCA in the mid 1980s.  Denies any chest pain at this time.  - Hold home aspirin for now given active GI bleed

## 2023-03-08 NOTE — Assessment & Plan Note (Signed)
No wheezing on examination and shortness of breath of suspected to be due to symptomatic anemia.  Patient is not on any bronchodilators at home.  - DuoNebs as needed

## 2023-03-08 NOTE — H&P (Signed)
History and Physical    Patient: Garrett Moore DOB: 16-Mar-1941 DOA: 03/08/2023 DOS: the patient was seen and examined on 03/08/2023 PCP: Daisy Floro, MD  Patient coming from: Home  Chief Complaint:  Chief Complaint  Patient presents with   Hypotension   HPI: Garrett Moore is a 82 y.o. male with medical history significant of CAD s/p PCI with DES to RCA (1985), AAA s/p endograft repair (2022), PAD, moderate aortic valve stenosis, hyperlipidemia, atrial fibrillation not on AC, COPD, HFpEF, CKD stage IIIa, BPH, who presents to the ED due to low blood pressure.  Garrett Moore states that since his recent hip replacement approximately 10 days ago on 12/5, he has been experiencing black stool.  He states that for the first 3 days after the surgery, he was also experiencing nausea with persistent vomiting that was coffee-ground in nature.  He has not had any more emesis since then but black stool has persisted.  He endorses dyspnea on exertion, generalized malaise, poor appetite.  He notes that over the last couple days, his right hip has been hurting him more but was unsure if this was normal part of healing.    He went to the ED on 12/8 for the symptoms and was treated symptomatically.  He then had a follow-up visit with his PCP on 12/12 and subsequent GI consult today at which time he was found to be hypotensive and referred to the ED.  He notes that he has been taking 2 baby aspirin daily.  His last dose of Plavix was approximately 02/18/2023.   ED course: On arrival to the ED, patient was normotensive at 110/51 with heart rate of 77.  He was saturating at 100% on room air.  He was afebrile at 97.8.  Initial workup notable for WBC of 13.9, hemoglobin of 8.4, platelets of 435, sodium 133, potassium 3.2, creatinine 1.25, and GFR 58.  Troponin 38.  Lactic acid 2.0.  CTA of the chest and CT abdomen/pelvis with contrast were ordered and results pending.  GI consulted.  TR[facility]  contacted for admission.  Review of Systems: As mentioned in the history of present illness. All other systems reviewed and are negative.  Past Medical History:  Diagnosis Date   Aneurysm of infrarenal abdominal aorta (HCC) 02/08/2018   a.) TTE 02/08/2018: Ao root 38 mm, asc Ao 40 mm; b.) TTE 06/14/2020; asc Ao 28 mm; c.) CT hematuria 05/10/2020: infrarenal AAA measuring 5.9 cm; d.) s/p EVAR 07/03/2020; e.) CT A/P 10/14/2021: residual aneurysmal sac (s/p EVAR) measured 6.0 cm   Aortic atherosclerosis (HCC)    Aortic valvar stenosis 07/21/2012   a.) TTE 07/21/2012: EF 50-55%, mild AS (AVA = 1.67, MPG 9); b.) TTE 02/08/2018: EF 60-65%; mod AS (MPG 17); c.) TTE 06/14/2020: EF 60-65%, mod AS (MPG 26.8)   Atherosclerosis of native arteries of extremity with intermittent claudication (HCC)    Atrial fibrillation (HCC)    Basal cell carcinoma, face    BPH (benign prostatic hyperplasia)    Carotid artery stenosis 05/21/2006   a.) carotid US 05/21/2006: 0-49% BICA; b.) carotid US 06/10/2020: 1-39% RICA, total occ LICA   CKD (chronic kidney disease), stage III (HCC)    COPD (chronic obstructive pulmonary disease) (HCC)    Coronary artery disease 08/14/1983   a.) MI --> LHC 08/13/2016: CTO of the RCA --> PTCA performed resulting in 30% residual stenosis; aberrant takeoff of his RCA coming off somewhat high and posteriorly.   Diastolic dysfunction 02/08/2018  a.) TTE 02/08/2018: EF 60-65%, mild MAC,  triv TR, mild-mod AS, G1DD; b.) TTE 06/14/2020: EF 60-65%, mod AS, triv TR, mild AR, G1DD   Diverticulosis    Family history of adverse reaction to anesthesia    a.) extended GA course for mother --> postoperaitve memory problems/dementia   GERD (gastroesophageal reflux disease)    Glaucoma    Heart murmur    History of kidney stones    Hyperlipidemia    Iliac artery aneurysm, bilateral (HCC) 07/03/2020   a.) s/p insertion of BILATERAL iliac artery stents   Myocardial infarction (HCC) 08/13/2016    a.) MI --> LHC 08/13/2016: CTO of the RCA --> PTCA performed resulting in 30% residual stenosis. Procedure complicated by procedural nausea, Patient went into IVR and then AV disaccociated rhythm. SBP dropped (80s). TVP floated with capture, which stabilized patient. ICU admission overnight.   OA (osteoarthritis)    Peripheral vascular disease (HCC)    right leg   Postoperative deep vein thrombosis (DVT) (HCC)    a.) postop age 70 following tonsillectomy (per patient report)   Past Surgical History:  Procedure Laterality Date   CATARACT EXTRACTION, BILATERAL  01/2019   CORONARY ANGIOPLASTY  08/14/1983   Procedure: CORONARY ANGIOPLASTY (PTCA pRCA); Location: [facility]; Surgeon: Al Little, MD   CYSTOSCOPY W/ RETROGRADES  08/12/2020   Procedure: CYSTOSCOPY WITH RETROGRADE PYELOGRAM;  Surgeon: Vanna Scotland, MD;  Location: [facility];  Service: Urology;;   CYSTOSCOPY WITH INSERTION OF UROLIFT N/A 12/22/2021   Procedure: CYSTOSCOPY WITH INSERTION OF UROLIFT;  Surgeon: Vanna Scotland, MD;  Location: [facility];  Service: Urology;  Laterality: N/A;   CYSTOSCOPY/URETEROSCOPY/HOLMIUM LASER/STENT PLACEMENT Left 08/12/2020   Procedure: CYSTOSCOPY/URETEROSCOPY/HOLMIUM LASER/STENT PLACEMENT;  Surgeon: Vanna Scotland, MD;  Location: [facility];  Service: Urology;  Laterality: Left;   ENDOVASCULAR REPAIR/STENT GRAFT N/A 07/03/2020   Procedure: ENDOVASCULAR REPAIR/STENT GRAFT;  Surgeon: Renford Dills, MD;  Location: [facility];  Service: Cardiovascular;  Laterality: N/A;   PELVIC ANGIOGRAPHY Right 10/14/2021   Procedure: PELVIC ANGIOGRAPHY;  Surgeon: Renford Dills, MD;  Location: [facility];  Service: Cardiovascular;  Laterality: Right;   PERIPHERAL VASCULAR THROMBECTOMY Right 10/14/2021   Procedure: PERIPHERAL VASCULAR THROMBECTOMY;  Surgeon: Renford Dills, MD;  Location: [facility];  Service: Cardiovascular;  Laterality: Right;   ROTATOR CUFF REPAIR Left     TEMPORARY PACEMAKER  08/14/1983   Procedure: TEMPORARY PACEMAKER PLACEMENT; Location: [facility]; Surgeon: Al Little, MD   TONSILLECTOMY     TOTAL HIP ARTHROPLASTY Left 02/21/2019   Procedure: TOTAL HIP ARTHROPLASTY ANTERIOR APPROACH;  Surgeon: Durene Romans, MD;  Location: [facility];  Service: Orthopedics;  Laterality: Left;  70 mins   TOTAL HIP ARTHROPLASTY Right 02/25/2023   Procedure: TOTAL HIP ARTHROPLASTY ANTERIOR APPROACH;  Surgeon: Durene Romans, MD;  Location: [facility];  Service: Orthopedics;  Laterality: Right;   VITRECTOMY Bilateral    Social History:  reports that he quit smoking about 20 years ago. His smoking use included cigarettes. He started smoking about 71 years ago. He has a 100 pack-year smoking history. He has never used smokeless tobacco. He reports that he does not currently use alcohol. He reports that he does not use drugs.  Allergies  Allergen Reactions   Quinolones Other (See Comments)    Other reaction(s): Has aortic root dilatation   Sulfa Antibiotics Rash    Family History  Problem Relation Age of Onset   Heart Problems Mother    AAA (abdominal  aortic aneurysm) Mother     Prior to Admission medications   Medication Sig Start Date End Date Taking? Authorizing Provider  aspirin 81 MG chewable tablet Chew 1 tablet (81 mg total) by mouth 2 (two) times daily for 28 days. 02/26/23 03/26/23  Cassandria Anger, PA-C  latanoprost (XALATAN) 0.005 % ophthalmic solution Place 1 drop into the right eye at bedtime. 01/22/23   [provider]  methocarbamol (ROBAXIN) 500 MG tablet Take 1 tablet (500 mg total) by mouth every 6 (six) hours as needed for muscle spasms. 02/26/23   Cassandria Anger, PA-C  metoCLOPramide (REGLAN) 5 MG tablet Take 1 tablet (5 mg total) by mouth every 6 (six) hours as needed for nausea. 02/28/23 02/28/24  Phineas Semen, MD  Multiple Vitamin (MULTIVITAMIN WITH MINERALS) TABS tablet Take 1 tablet by mouth daily.    [provider]   nicotine polacrilex (COMMIT) 2 MG lozenge Take 1 mg by mouth as needed for smoking cessation.    [provider]  polyethylene glycol (MIRALAX / GLYCOLAX) 17 g packet Take 17 g by mouth 2 (two) times daily. 02/26/23   Cassandria Anger, PA-C  senna (SENOKOT) 8.6 MG TABS tablet Take 2 tablets (17.2 mg total) by mouth at bedtime for 14 days. 02/26/23 03/12/23  Cassandria Anger, PA-C  simvastatin (ZOCOR) 80 MG tablet Take 80 mg by mouth at bedtime.    [provider]  traMADol (ULTRAM) 50 MG tablet Take 1-2 tablets (50-100 mg total) by mouth every 6 (six) hours as needed for severe pain (pain score 7-10) or moderate pain (pain score 4-6). 02/26/23   Cassandria Anger, PA-C    Physical Exam: Vitals:   03/08/23 1123 03/08/23 1125 03/08/23 1135  BP: (!) 110/51    Pulse: 77    Resp: 20    Temp: (!) 97.5 F (36.4 C)    TempSrc: Oral    SpO2:  100%   Weight:   76.7 kg  Height:   5\' 11"  (1.803 m)   Physical Exam Vitals reviewed.  Constitutional:      General: He is not in acute distress.    Appearance: He is normal weight.  HENT:     Head: Normocephalic and atraumatic.     Mouth/Throat:     Mouth: Mucous membranes are moist.     Pharynx: Oropharynx is clear.  Eyes:     Conjunctiva/sclera: Conjunctivae normal.     Pupils: Pupils are equal, round, and reactive to light.  Cardiovascular:     Rate and Rhythm: Normal rate and regular rhythm.     Heart sounds: Murmur (systolic murmur, 3/6) heard.  Pulmonary:     Effort: Pulmonary effort is normal. No respiratory distress.     Breath sounds: Normal breath sounds. No wheezing, rhonchi or rales.  Abdominal:     General: Bowel sounds are normal. There is no distension.     Palpations: Abdomen is soft.  Musculoskeletal:     Right lower leg: No edema.     Left lower leg: No edema.  Skin:    General: Skin is warm.     Coloration: Skin is pale.  Neurological:     Mental Status: He is alert and oriented to person, place, and  time. Mental status is at baseline.  Psychiatric:        Mood and Affect: Mood normal.        Behavior: Behavior normal.    Data Reviewed: CBC with WBC of 13.9,  hemoglobin of 8.4, MCV 101.1, platelets of 435 CMP with sodium of 133, potassium 3.2, bicarb 23, glucose 119, BUN 16, creatinine 1.25, albumin 2.6, and GFR 58 Troponin 38 Lactic acid 2.0 INR 1.1  COVID-19, influenza and RSV PCR negative  EKG personally reviewed.  Sinus rhythm with rate of 81.  No ST or T wave changes concerning for acute ischemia.  Results are pending, will review when available.  Assessment and Plan:  * Upper GI bleed Patient is presenting with 10 days of melena in addition to 3 days of hematemesis that resolved consistent with upper GI bleed.  He is currently on a baby aspirin only, as he discontinued Plavix 1 week prior to hip surgery.  No other NSAID use.  - GI consulted; appreciate their recommendations - Clear liquid diet - N.p.o. after midnight - Protonix 40 mg IV twice daily - CT of the abdomen ordered in the ED pending  Symptomatic anemia Patient presenting with dyspnea on exertion, generalized weakness and malaise consistent with symptomatic anemia.  Previous hemoglobin around 12, currently 8.3.  CTA was ordered, however low suspicion for PE at this time.  - 1 unit of packed RBC ordered given elevated troponin and lactic acid - Posttransfusion CBC - Continue CBC monitoring twice daily - Iron panel and vitamin B12 pending  Demand ischemia (HCC) Minimally elevated troponin with no active chest pain and reassuring EKG.  Likely due to demand in the setting of acute blood loss anemia.  - Repeat troponin pending  Status post total hip replacement, left Recent total hip replacement on 12/5 with initial improvement in pain, however patient states pain has worsened over the last few days.  - Tramadol as needed - PT/OT - Will message orthopedic surgery to see if worsening in pain is  expected  Paroxysmal atrial fibrillation [facility]) Patient states he has had only 2 episodes of atrial fibrillation in the past, due to this, he has declined any anticoagulant use.  EKG demonstrating sinus rhythm.  Chronic diastolic CHF (congestive heart failure) (HCC) Previous history of HFpEF, however most recent echocardiogram noted EF of 60-65% no evidence of diastolic dysfunction.  Patient is not on any diuretics at home.  He appears euvolemic at this time.  COPD (chronic obstructive pulmonary disease) (HCC) No wheezing on examination and shortness of breath of suspected to be due to symptomatic anemia.  Patient is not on any bronchodilators at home.  - DuoNebs as needed  Stage 3a chronic kidney disease (CKD) (HCC) History of CKD stage IIIa with creatinine ranging between 1.06 and 1.50 in the last several years.  Currently within baseline range.  -Repeat BMP in a.m. given he received IV contrast today  CAD S/P percutaneous coronary angioplasty CAD s/p DES to RCA in the mid 1980s.  Denies any chest pain at this time.  - Hold home aspirin for now given active GI bleed  Advance Care Planning:   Code Status: Limited: Do not attempt resuscitation (DNR) -DNR-LIMITED -Do Not Intubate/DNI verified by patient with daughter-in-law at bedside.  Daughter-in-law confirms that patient has relayed these wishes to his family.  Consults: GI  Family Communication: Patient's are in law updated at bedside  Severity of Illness: The appropriate patient status for this patient is INPATIENT. Inpatient status is judged to be reasonable and necessary in order to provide the required intensity of service to ensure the patient's safety. The patient's presenting symptoms, physical exam findings, and initial radiographic and laboratory data in the context of their chronic comorbidities  is felt to place them at high risk for further clinical deterioration. Furthermore, it is not anticipated that the patient will be  medically stable for discharge from the hospital within 2 midnights of admission.   * I certify that at the point of admission it is my clinical judgment that the patient will require inpatient hospital care spanning beyond 2 midnights from the point of admission due to high intensity of service, high risk for further deterioration and high frequency of surveillance required.*  Author: Verdene Lennert, MD 03/08/2023 2:05 PM  For on call review [email or web address].

## 2023-03-08 NOTE — Consult Note (Signed)
Arlyss Repress, MD [address]  [address]  [place], [place] [address]  Main: [phone]  Fax: [phone] Pager: [phone]   Consultation  Referring Provider:     No ref. provider found Primary Care Physician:  Daisy Floro, MD Primary Gastroenterologist:  Dr. Mia Creek         Reason for Consultation: Melena  Date of Admission:  03/08/2023 Date of Consultation:  03/08/2023         HPI:   Garrett Moore is a 82 y.o. male who underwent right hip replacement on 02/25/2023 secondary to osteoarthritis.  He is evaluated by [facility] clinic gastroenterology today for 10 days history of nausea, decreased appetite and black tarry stools.  He was seen in at [facility] ER on 12/8 secondary to nausea and vomiting which was attributed to antibiotics that he took after hip surgery.  He received IV fluids, antiemetics.  Patient was seen by PCP on 12/12 secondary to confusion, weakness, concern for upper GI bleed secondary to melena and he was started on Protonix as well as Carafate.  He reports feeling poorly, generalized fatigue.  His hemoglobin on 12/8 was 8.6, baseline of 12 before hip replacement, thrombocytosis as well as mildly elevated BUN/creatinine.  Patient was sent to the ER for further evaluation directly from the clinic.  Repeat hemoglobin was 8.4 in the ER, platelets 435, BUN/creatinine returned normal.  It was 39/1.19 on 12/8 Patient was taking Plavix for 6 months prior history of stroke, he stopped as recommendation by cardiologist 10 days before hip replacement.  Post surgery, he has been taking aspirin 81 mg in the morning and in the evening.  He took aspirin in the morning today  NSAIDs: None  Antiplts/Anticoagulants/Anti thrombotics: Aspirin 81  GI Procedures: None  Past Medical History:  Diagnosis Date   Aneurysm of infrarenal abdominal aorta (HCC) 02/08/2018   a.) TTE 02/08/2018: Ao root 38 mm, asc Ao 40 mm; b.) TTE 06/14/2020; asc Ao 28 mm; c.) CT hematuria  05/10/2020: infrarenal AAA measuring 5.9 cm; d.) s/p EVAR 07/03/2020; e.) CT A/P 10/14/2021: residual aneurysmal sac (s/p EVAR) measured 6.0 cm   Aortic atherosclerosis (HCC)    Aortic valvar stenosis 07/21/2012   a.) TTE 07/21/2012: EF 50-55%, mild AS (AVA = 1.67, MPG 9); b.) TTE 02/08/2018: EF 60-65%; mod AS (MPG 17); c.) TTE 06/14/2020: EF 60-65%, mod AS (MPG 26.8)   Atherosclerosis of native arteries of extremity with intermittent claudication (HCC)    Atrial fibrillation (HCC)    Basal cell carcinoma, face    BPH (benign prostatic hyperplasia)    Carotid artery stenosis 05/21/2006   a.) carotid US 05/21/2006: 0-49% BICA; b.) carotid US 06/10/2020: 1-39% RICA, total occ LICA   CKD (chronic kidney disease), stage III (HCC)    COPD (chronic obstructive pulmonary disease) (HCC)    Coronary artery disease 08/14/1983   a.) MI --> LHC 08/13/2016: CTO of the RCA --> PTCA performed resulting in 30% residual stenosis; aberrant takeoff of his RCA coming off somewhat high and posteriorly.   Diastolic dysfunction 02/08/2018   a.) TTE 02/08/2018: EF 60-65%, mild MAC,  triv TR, mild-mod AS, G1DD; b.) TTE 06/14/2020: EF 60-65%, mod AS, triv TR, mild AR, G1DD   Diverticulosis    Family history of adverse reaction to anesthesia    a.) extended GA course for mother --> postoperaitve memory problems/dementia   GERD (gastroesophageal reflux disease)    Glaucoma    Heart murmur  History of kidney stones    Hyperlipidemia    Iliac artery aneurysm, bilateral (HCC) 07/03/2020   a.) s/p insertion of BILATERAL iliac artery stents   Myocardial infarction (HCC) 08/13/2016   a.) MI --> LHC 08/13/2016: CTO of the RCA --> PTCA performed resulting in 30% residual stenosis. Procedure complicated by procedural nausea, Patient went into IVR and then AV disaccociated rhythm. SBP dropped (80s). TVP floated with capture, which stabilized patient. ICU admission overnight.   OA (osteoarthritis)    Peripheral vascular  disease (HCC)    right leg   Postoperative deep vein thrombosis (DVT) (HCC)    a.) postop age 37 following tonsillectomy (per patient report)    Past Surgical History:  Procedure Laterality Date   CATARACT EXTRACTION, BILATERAL  01/2019   CORONARY ANGIOPLASTY  08/14/1983   Procedure: CORONARY ANGIOPLASTY (PTCA pRCA); Location: [facility]; Surgeon: Al Little, MD   CYSTOSCOPY W/ RETROGRADES  08/12/2020   Procedure: CYSTOSCOPY WITH RETROGRADE PYELOGRAM;  Surgeon: Vanna Scotland, MD;  Location: [facility];  Service: Urology;;   CYSTOSCOPY WITH INSERTION OF UROLIFT N/A 12/22/2021   Procedure: CYSTOSCOPY WITH INSERTION OF UROLIFT;  Surgeon: Vanna Scotland, MD;  Location: [facility];  Service: Urology;  Laterality: N/A;   CYSTOSCOPY/URETEROSCOPY/HOLMIUM LASER/STENT PLACEMENT Left 08/12/2020   Procedure: CYSTOSCOPY/URETEROSCOPY/HOLMIUM LASER/STENT PLACEMENT;  Surgeon: Vanna Scotland, MD;  Location: [facility];  Service: Urology;  Laterality: Left;   ENDOVASCULAR REPAIR/STENT GRAFT N/A 07/03/2020   Procedure: ENDOVASCULAR REPAIR/STENT GRAFT;  Surgeon: Renford Dills, MD;  Location: [facility];  Service: Cardiovascular;  Laterality: N/A;   PELVIC ANGIOGRAPHY Right 10/14/2021   Procedure: PELVIC ANGIOGRAPHY;  Surgeon: Renford Dills, MD;  Location: [facility];  Service: Cardiovascular;  Laterality: Right;   PERIPHERAL VASCULAR THROMBECTOMY Right 10/14/2021   Procedure: PERIPHERAL VASCULAR THROMBECTOMY;  Surgeon: Renford Dills, MD;  Location: [facility];  Service: Cardiovascular;  Laterality: Right;   ROTATOR CUFF REPAIR Left    TEMPORARY PACEMAKER  08/14/1983   Procedure: TEMPORARY PACEMAKER PLACEMENT; Location: [facility]; Surgeon: Al Little, MD   TONSILLECTOMY     TOTAL HIP ARTHROPLASTY Left 02/21/2019   Procedure: TOTAL HIP ARTHROPLASTY ANTERIOR APPROACH;  Surgeon: Durene Romans, MD;  Location: [facility];  Service: Orthopedics;  Laterality: Left;  70 mins    TOTAL HIP ARTHROPLASTY Right 02/25/2023   Procedure: TOTAL HIP ARTHROPLASTY ANTERIOR APPROACH;  Surgeon: Durene Romans, MD;  Location: [facility];  Service: Orthopedics;  Laterality: Right;   VITRECTOMY Bilateral      Current Facility-Administered Medications:    acetaminophen (TYLENOL) tablet 650 mg, 650 mg, Oral, Q6H PRN **OR** acetaminophen (TYLENOL) suppository 650 mg, 650 mg, Rectal, Q6H PRN, Verdene Lennert, MD   latanoprost (XALATAN) 0.005 % ophthalmic solution 1 drop, 1 drop, Right Eye, QHS, Verdene Lennert, MD   methocarbamol (ROBAXIN) tablet 500 mg, 500 mg, Oral, Q6H PRN, Verdene Lennert, MD   ondansetron (ZOFRAN) tablet 4 mg, 4 mg, Oral, Q6H PRN **OR** ondansetron (ZOFRAN) injection 4 mg, 4 mg, Intravenous, Q6H PRN, Verdene Lennert, MD   pantoprazole (PROTONIX) injection 40 mg, 40 mg, Intravenous, Q12H, Lashina Milles, Loel Dubonnet, MD   polyethylene glycol (MIRALAX / GLYCOLAX) packet 17 g, 17 g, Oral, Daily PRN, Verdene Lennert, MD   potassium chloride SA (KLOR-CON M) CR tablet 40 mEq, 40 mEq, Oral, Once, Verdene Lennert, MD   sodium chloride flush (NS) 0.9 % injection 3 mL, 3 mL, Intravenous, Q12H, Verdene Lennert, MD   traMADol (ULTRAM) tablet 50 mg, 50  mg, Oral, Q6H PRN, Verdene Lennert, MD  Current Outpatient Medications:    aspirin 81 MG chewable tablet, Chew 1 tablet (81 mg total) by mouth 2 (two) times daily for 28 days., Disp: 56 tablet, Rfl: 0   iron polysaccharides (NIFEREX) 150 MG capsule, 1 capsule Orally once a day for 30 days, Disp: , Rfl:    latanoprost (XALATAN) 0.005 % ophthalmic solution, Place 1 drop into the right eye at bedtime., Disp: , Rfl:    methocarbamol (ROBAXIN) 500 MG tablet, Take 1 tablet (500 mg total) by mouth every 6 (six) hours as needed for muscle spasms., Disp: 40 tablet, Rfl: 2   Multiple Vitamin (MULTIVITAMIN WITH MINERALS) TABS tablet, Take 1 tablet by mouth daily., Disp: , Rfl:    pantoprazole (PROTONIX) 40 MG tablet, Take by mouth., Disp: , Rfl:     simvastatin (ZOCOR) 80 MG tablet, Take 80 mg by mouth at bedtime., Disp: , Rfl:    sucralfate (CARAFATE) 1 g tablet, Take by mouth., Disp: , Rfl:    traMADol (ULTRAM) 50 MG tablet, Take 1-2 tablets (50-100 mg total) by mouth every 6 (six) hours as needed for severe pain (pain score 7-10) or moderate pain (pain score 4-6)., Disp: 42 tablet, Rfl: 0   clopidogrel (PLAVIX) 75 MG tablet, Take 1 tablet by mouth daily. (Patient not taking: Reported on 03/08/2023), Disp: , Rfl:    metoCLOPramide (REGLAN) 5 MG tablet, Take 1 tablet (5 mg total) by mouth every 6 (six) hours as needed for nausea. (Patient not taking: Reported on 03/08/2023), Disp: 20 tablet, Rfl: 1   nicotine polacrilex (COMMIT) 2 MG lozenge, Take 1 mg by mouth as needed for smoking cessation., Disp: , Rfl:    polyethylene glycol (MIRALAX / GLYCOLAX) 17 g packet, Take 17 g by mouth 2 (two) times daily. (Patient not taking: Reported on 03/08/2023), Disp: 14 each, Rfl: 0   senna (SENOKOT) 8.6 MG TABS tablet, Take 2 tablets (17.2 mg total) by mouth at bedtime for 14 days. (Patient not taking: Reported on 03/08/2023), Disp: 28 tablet, Rfl: 0   Family History  Problem Relation Age of Onset   Heart Problems Mother    AAA (abdominal aortic aneurysm) Mother      Social History   Tobacco Use   Smoking status: Former    Current packs/day: 0.00    Average packs/day: 2.0 packs/day for 50.0 years (100.0 ttl pk-yrs)    Types: Cigarettes    Start date: 85    Quit date: 2004    Years since quitting: 20.9   Smokeless tobacco: Never   Tobacco comments:    quit 17 years ago  Vaping Use   Vaping status: Never Used  Substance Use Topics   Alcohol use: Not Currently   Drug use: Never    Allergies as of 03/08/2023 - Review Complete 03/08/2023  Allergen Reaction Noted   Quinolones Other (See Comments) 07/04/2020   Sulfa antibiotics Rash 11/03/2021    Review of Systems:    All systems reviewed and negative except where noted in HPI.    Physical Exam:  Vital signs in last 24 hours: Temp:  [97.5 F (36.4 C)-97.8 F (36.6 C)] 97.8 F (36.6 C) (12/16 1452) Pulse Rate:  [77-82] 82 (12/16 1452) Resp:  [19-20] 19 (12/16 1452) BP: (110-121)/(51-55) 121/54 (12/16 1452) SpO2:  [99 %-100 %] 99 % (12/16 1428) Weight:  [76.7 kg] 76.7 kg (12/16 1135)   General:   Pleasant, cooperative in NAD Head:  Normocephalic and atraumatic.  Eyes:   No icterus.   Conjunctiva pink. PERRLA. Ears:  Normal auditory acuity. Neck:  Supple; no masses or thyroidomegaly Lungs: Respirations even and unlabored. Lungs clear to auscultation bilaterally.   No wheezes, crackles, or rhonchi.  Heart:  Regular rate and rhythm;  Without murmur, clicks, rubs or gallops Abdomen:  Soft, nondistended, nontender. Normal bowel sounds. No appreciable masses or hepatomegaly.  No rebound or guarding.  Rectal:  Not performed. Msk:  Symmetrical without gross deformities.  Strength generalized weakness Extremities:  Without edema, cyanosis or clubbing. Neurologic:  Alert and oriented x3;  grossly normal neurologically. Skin:  Intact without significant lesions or rashes. Psych:  Alert and cooperative. Normal affect.  LAB RESULTS:    Latest Ref Rng & Units 03/08/2023   11:36 AM 02/28/2023    5:13 PM 02/26/2023    3:36 AM  CBC  WBC 4.0 - 10.5 K/uL 13.9  17.6  21.5   Hemoglobin 13.0 - 17.0 g/dL 8.4  8.6  9.6   Hematocrit 39.0 - 52.0 % 26.4  25.7  28.7   Platelets 150 - 400 K/uL 435  284  236     BMET    Latest Ref Rng & Units 03/08/2023   11:36 AM 02/28/2023    5:13 PM 02/26/2023    3:36 AM  BMP  Glucose 70 - 99 mg/dL 161  096  045   BUN 8 - 23 mg/dL 16  39  26   Creatinine 0.61 - 1.24 mg/dL 4.09  8.11  9.14   Sodium 135 - 145 mmol/L 133  138  132   Potassium 3.5 - 5.1 mmol/L 3.2  3.8  3.5   Chloride 98 - 111 mmol/L 101  104  103   CO2 22 - 32 mmol/L 23  23  21    Calcium 8.9 - 10.3 mg/dL 7.7  7.9  8.1     LFT    Latest Ref Rng & Units 03/08/2023   11:36  AM 02/28/2023    5:13 PM 05/20/2022    3:57 AM  Hepatic Function  Total Protein 6.5 - 8.1 g/dL 5.8  6.2  6.3   Albumin 3.5 - 5.0 g/dL 2.6  2.8  3.0   AST 15 - 41 U/L 29  32  28   ALT 0 - 44 U/L 29  14  22    Alk Phosphatase 38 - 126 U/L 80  55  67   Total Bilirubin <1.2 mg/dL 0.6  0.9  0.7      STUDIES: CT Angio Chest PE W and/or Wo Contrast Result Date: 03/08/2023 CLINICAL DATA:  LLQ abdominal pain; Pulmonary embolism (PE) suspected, high prob. Hypotension. Weakness. Suspected bleeding ulcer. EXAM: CT ANGIOGRAPHY CHEST CT ABDOMEN AND PELVIS WITH CONTRAST TECHNIQUE: Multidetector CT imaging of the chest was performed using the standard protocol during bolus administration of intravenous contrast. Multiplanar CT image reconstructions and MIPs were obtained to evaluate the vascular anatomy. Multidetector CT imaging of the abdomen and pelvis was performed using the standard protocol during bolus administration of intravenous contrast. RADIATION DOSE REDUCTION: This exam was performed according to the departmental dose-optimization program which includes automated exposure control, adjustment of the mA and/or kV according to patient size and/or use of iterative reconstruction technique. CONTRAST:  OMNIPAQUE IOHEXOL 300 MG/ML SOLN; 50mL OMNIPAQUE IOHEXOL 350 MG/ML SOLN COMPARISON:  CT angiography abdomen and pelvis from 07/05/202320, FINDINGS: CTA CHEST FINDINGS Cardiovascular: No evidence of embolism to the proximal subsegmental pulmonary artery level. Normal cardiac size.  No pericardial effusion. No aortic aneurysm. There are coronary artery calcifications, in keeping with coronary artery disease. There are also mild-to-moderate peripheral atherosclerotic vascular calcifications of thoracic aorta and its major branches. Mediastinum/Nodes: Visualized thyroid gland appears grossly unremarkable. No solid / cystic mediastinal masses. The esophagus is nondistended precluding optimal assessment. There is  mild circumferential thickening of the lower thoracic esophagus, which is most likely seen in the settings of chronic gastroesophageal reflux disease versus esophagitis. No axillary, mediastinal or hilar lymphadenopathy by size criteria. Lungs/Pleura: The central tracheo-bronchial tree is patent. There are mild-to-moderate upper lobe predominant centrilobular emphysematous changes. There are patchy areas of linear, plate-like atelectasis and/or scarring throughout bilateral lungs. No mass or consolidation. No pleural effusion or pneumothorax. There are multiple lung nodules, described as follows: *There is a lobulated 7 x 11 mm nodule in the left lung lower lobe (series 6, image 67). Please see follow-up recommendations below. *There is a 6 x 6 mm lobulated noncalcified nodule in the right lung lower lobe (series 6, image 94). *There are additional smaller nodules throughout bilateral lungs (marked with electronic arrow sign on series 100). Musculoskeletal: In the right paramedian upper back, there is a 1.7 x 3.1 cm soft tissue structure centered in the skin/subcutaneous tissue, incompletely characterized on the current exam but favored to represent epidermal inclusion cyst. The visualized soft tissues of the chest wall are otherwise grossly unremarkable. No suspicious osseous lesions. There are mild to moderate multilevel degenerative changes in the visualized spine. Review of the MIP images confirms the above findings. CT ABDOMEN and PELVIS FINDINGS Hepatobiliary: The liver is normal in size. Non-cirrhotic configuration. No suspicious mass. There is an ill-defined, hypoattenuating, approximately 1.2 x 2.3 cm area in the left hepatic lobe, segment 4B, which is incompletely characterized on the current examination. No such area was seen on the prior CT scan; however, this is favored to represent focal fatty infiltration. Further evaluation with multiphasic contrast-enhanced MRI abdomen is recommended. No  intrahepatic or extrahepatic bile duct dilation. No calcified gallstones. Normal gallbladder wall thickness. No pericholecystic inflammatory changes. Pancreas: Unremarkable. No pancreatic ductal dilatation or surrounding inflammatory changes. Spleen: Within normal limits. No focal lesion. Adrenals/Urinary Tract: Adrenal glands are unremarkable. No suspicious renal mass. There are at least 2 simple cysts in the left kidney with largest measuring up to 1.6 x 1.8 cm. Focal scarring noted in the left kidney upper pole. No nephroureterolithiasis or obstructive uropathy. Evaluation of the urinary bladder is nondiagnostic due to extensive streak artifacts from bilateral hip arthroplasty hardware. Stomach/Bowel: There is probable small sliding hiatal hernia. There is a diverticulum arising from the second part of duodenum. There is short segment (approximately 2 cm) of distal descending colon, exhibiting mild irregular wall thickening and pericolonic fat stranding on the background of several diverticula, compatible with acute uncomplicated diverticulitis. No walled off abscess or loculated collection. No pneumoperitoneum. No disproportionate dilation of small or large bowel loops to suggest bowel obstruction. Unremarkable appendix. Vascular/Lymphatic: No ascites or pneumoperitoneum. No abdominal or pelvic lymphadenopathy, by size criteria. No aneurysmal dilation of the major abdominal arteries. Redemonstration of patient's known aorto bi-iliac stent and aneurysm of native aorta measuring up to 5.3 x 6.5 cm. No significant interval change in size of the aneurysm, when remeasured in similar fashion. No endoleak seen. 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 Reproductive: Prostate is not diagnostically evaluated due to extensive streak artifacts. Other: There is a tiny fat containing left inguinal  hernia. The soft tissues and abdominal wall are  otherwise unremarkable. Musculoskeletal: No suspicious osseous lesions. There are mild - moderate multilevel degenerative changes in the visualized spine. Note is made of bilateral hip arthroplasty. Review of the MIP images confirms the above findings. IMPRESSION: 1. No embolism to the proximal subsegmental pulmonary artery level. 2. There are several lung nodules with largest 7 x 11 mm solid noncalcified nodule in the left lung lower lobe. Please see follow-up recommendations below. 3. Acute uncomplicated distal descending colon diverticulitis, as described above. 4. There is a 1.2 x 1.3 cm hypoattenuating area in the left hepatic lobe, segment IVb, incompletely characterized on the current exam. Further evaluation with nonemergent multiphasic contrast-enhanced MRI abdomen is recommended. 5. Multiple other nonacute observations, as described above. Aortic aneurysm NOS (ICD10-I71.9). Aortic Atherosclerosis (ICD10-I70.0) and Emphysema (ICD10-J43.9). Pulmonary nodule follow-up recommendation: Solid lung nodule measuring more than 8 mm: - LOW RISK: Consider CT at 3 months, PET / CT or tissue sampling, - HIGHER RISK: : Consider CT at 3 months, PET / CT or tissue sampling. Recommendations for evaluation of incidental nodules derived from guidelines developed by the Fleischner Society ( Radiology 2017; [phone]). These guidelines apply to incidental nodules, which can be managed according to the specific recommendations. These guidelines do not apply to patient's younger than 35 years, immunocompromised patients, or patients with cancer. For lung cancer screening, adherence to the existing [employer] of radiology lung CT Screening Reporting and Data System (lung-RADS) guidelines is recommended. High risk factors include older age, heavy smoking, larger nodule size, irregular or spiculated margins and upper lobe location. Clinical risk factors include smoking, exposure to other carcinogens, emphysema, fibrosis,  upper lobe location, family history of lung cancer, age and sex. Electronically Signed   By: Jules Schick M.D.   On: 03/08/2023 14:36   CT ABDOMEN PELVIS W CONTRAST Result Date: 03/08/2023 CLINICAL DATA:  LLQ abdominal pain; Pulmonary embolism (PE) suspected, high prob. Hypotension. Weakness. Suspected bleeding ulcer. EXAM: CT ANGIOGRAPHY CHEST CT ABDOMEN AND PELVIS WITH CONTRAST TECHNIQUE: Multidetector CT imaging of the chest was performed using the standard protocol during bolus administration of intravenous contrast. Multiplanar CT image reconstructions and MIPs were obtained to evaluate the vascular anatomy. Multidetector CT imaging of the abdomen and pelvis was performed using the standard protocol during bolus administration of intravenous contrast. RADIATION DOSE REDUCTION: This exam was performed according to the departmental dose-optimization program which includes automated exposure control, adjustment of the mA and/or kV according to patient size and/or use of iterative reconstruction technique. CONTRAST:  OMNIPAQUE IOHEXOL 300 MG/ML SOLN; 50mL OMNIPAQUE IOHEXOL 350 MG/ML SOLN COMPARISON:  CT angiography abdomen and pelvis from 07/05/202320, FINDINGS: CTA CHEST FINDINGS Cardiovascular: No evidence of embolism to the proximal subsegmental pulmonary artery level. Normal cardiac size. No pericardial effusion. No aortic aneurysm. There are coronary artery calcifications, in keeping with coronary artery disease. There are also mild-to-moderate peripheral atherosclerotic vascular calcifications of thoracic aorta and its major branches. Mediastinum/Nodes: Visualized thyroid gland appears grossly unremarkable. No solid / cystic mediastinal masses. The esophagus is nondistended precluding optimal assessment. There is mild circumferential thickening of the lower thoracic esophagus, which is most likely seen in the settings of chronic gastroesophageal reflux disease versus esophagitis. No axillary,  mediastinal or hilar lymphadenopathy by size criteria. Lungs/Pleura: The central tracheo-bronchial tree is patent. There are mild-to-moderate upper lobe predominant centrilobular emphysematous changes. There are patchy areas of linear, plate-like atelectasis and/or scarring throughout bilateral lungs. No mass or consolidation. No pleural effusion  or pneumothorax. There are multiple lung nodules, described as follows: *There is a lobulated 7 x 11 mm nodule in the left lung lower lobe (series 6, image 67). Please see follow-up recommendations below. *There is a 6 x 6 mm lobulated noncalcified nodule in the right lung lower lobe (series 6, image 94). *There are additional smaller nodules throughout bilateral lungs (marked with electronic arrow sign on series 100). Musculoskeletal: In the right paramedian upper back, there is a 1.7 x 3.1 cm soft tissue structure centered in the skin/subcutaneous tissue, incompletely characterized on the current exam but favored to represent epidermal inclusion cyst. The visualized soft tissues of the chest wall are otherwise grossly unremarkable. No suspicious osseous lesions. There are mild to moderate multilevel degenerative changes in the visualized spine. Review of the MIP images confirms the above findings. CT ABDOMEN and PELVIS FINDINGS Hepatobiliary: The liver is normal in size. Non-cirrhotic configuration. No suspicious mass. There is an ill-defined, hypoattenuating, approximately 1.2 x 2.3 cm area in the left hepatic lobe, segment 4B, which is incompletely characterized on the current examination. No such area was seen on the prior CT scan; however, this is favored to represent focal fatty infiltration. Further evaluation with multiphasic contrast-enhanced MRI abdomen is recommended. No intrahepatic or extrahepatic bile duct dilation. No calcified gallstones. Normal gallbladder wall thickness. No pericholecystic inflammatory changes. Pancreas: Unremarkable. No pancreatic ductal  dilatation or surrounding inflammatory changes. Spleen: Within normal limits. No focal lesion. Adrenals/Urinary Tract: Adrenal glands are unremarkable. No suspicious renal mass. There are at least 2 simple cysts in the left kidney with largest measuring up to 1.6 x 1.8 cm. Focal scarring noted in the left kidney upper pole. No nephroureterolithiasis or obstructive uropathy. Evaluation of the urinary bladder is nondiagnostic due to extensive streak artifacts from bilateral hip arthroplasty hardware. Stomach/Bowel: There is probable small sliding hiatal hernia. There is a diverticulum arising from the second part of duodenum. There is short segment (approximately 2 cm) of distal descending colon, exhibiting mild irregular wall thickening and pericolonic fat stranding on the background of several diverticula, compatible with acute uncomplicated diverticulitis. No walled off abscess or loculated collection. No pneumoperitoneum. No disproportionate dilation of small or large bowel loops to suggest bowel obstruction. Unremarkable appendix. Vascular/Lymphatic: No ascites or pneumoperitoneum. No abdominal or pelvic lymphadenopathy, by size criteria. No aneurysmal dilation of the major abdominal arteries. Redemonstration of patient's known aorto bi-iliac stent and aneurysm of native aorta measuring up to 5.3 x 6.5 cm. No significant interval change in size of the aneurysm, when remeasured in similar fashion. No endoleak seen. 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 Reproductive: Prostate is not diagnostically evaluated due to extensive streak artifacts. Other: There is a tiny fat containing left inguinal hernia. The soft tissues and abdominal wall are otherwise unremarkable. Musculoskeletal: No suspicious osseous lesions. There are mild - moderate multilevel degenerative changes in the visualized spine. Note is made of bilateral hip arthroplasty.  Review of the MIP images confirms the above findings. IMPRESSION: 1. No embolism to the proximal subsegmental pulmonary artery level. 2. There are several lung nodules with largest 7 x 11 mm solid noncalcified nodule in the left lung lower lobe. Please see follow-up recommendations below. 3. Acute uncomplicated distal descending colon diverticulitis, as described above. 4. There is a 1.2 x 1.3 cm hypoattenuating area in the left hepatic lobe, segment IVb, incompletely characterized on the current exam. Further evaluation with nonemergent multiphasic contrast-enhanced MRI abdomen is  recommended. 5. Multiple other nonacute observations, as described above. Aortic aneurysm NOS (ICD10-I71.9). Aortic Atherosclerosis (ICD10-I70.0) and Emphysema (ICD10-J43.9). Pulmonary nodule follow-up recommendation: Solid lung nodule measuring more than 8 mm: - LOW RISK: Consider CT at 3 months, PET / CT or tissue sampling, - HIGHER RISK: : Consider CT at 3 months, PET / CT or tissue sampling. Recommendations for evaluation of incidental nodules derived from guidelines developed by the Fleischner Society ( Radiology 2017; [phone]). These guidelines apply to incidental nodules, which can be managed according to the specific recommendations. These guidelines do not apply to patient's younger than 35 years, immunocompromised patients, or patients with cancer. For lung cancer screening, adherence to the existing [employer] of radiology lung CT Screening Reporting and Data System (lung-RADS) guidelines is recommended. High risk factors include older age, heavy smoking, larger nodule size, irregular or spiculated margins and upper lobe location. Clinical risk factors include smoking, exposure to other carcinogens, emphysema, fibrosis, upper lobe location, family history of lung cancer, age and sex. Electronically Signed   By: Jules Schick M.D.   On: 03/08/2023 14:36      Impression / Plan:   Garrett Moore is a 82 y.o. male  with history of carotid artery stenosis, moderate AAS, CHF, AAA, peripheral artery disease who underwent right hip replacement on 02/25/23 presented with approximately 10 days history of melena and severe symptomatic anemia, s/p 1 unit of PRBCs  Melena with severe symptomatic anemia Most likely upper GI bleed, BUN/creatinine was elevated 4 days ago, today, the ratio is normal, patient had bled and probably stopped bleeding within last 4 days and passing old blood Hemoglobin is stable Recommend Protonix 40 mg p.o./IV twice daily Monitor CBC closely, check iron panel, B12 and folate levels Recommend EGD for further evaluation, will plan for tomorrow.  If EGD is unremarkable, recommend colonoscopy with possible video capsule endoscopy Okay to start liquid diet N.p.o. effective 5 AM tomorrow Patient's daughter is bedside, both agreement with the plan  I have discussed alternative options, risks & benefits,  which include, but are not limited to, bleeding, infection, perforation,respiratory complication & drug reaction.  The patient agrees with this plan & written consent will be obtained.    Thank you for involving me in the care of this patient.      LOS: 0 days   Lannette Donath, MD  03/08/2023, 4:44 PM    Note: This dictation was prepared with Dragon dictation along with smaller phrase technology. Any transcriptional errors that result from this process are unintentional.

## 2023-03-08 NOTE — Assessment & Plan Note (Signed)
Patient presenting with dyspnea on exertion, generalized weakness and malaise consistent with symptomatic anemia.  Previous hemoglobin around 12, currently 8.3.  CTA was ordered, however low suspicion for PE at this time.  - 1 unit of packed RBC ordered given elevated troponin and lactic acid - Posttransfusion CBC - Continue CBC monitoring twice daily - Iron panel and vitamin B12 pending

## 2023-03-08 NOTE — ED Triage Notes (Signed)
Pt here with daughter in law and son from GI doctor for hypotension (74/40), concerned for bleeding ulcer. Weakness for a weak and a half. Has had black tarry stool and SOB.

## 2023-03-09 ENCOUNTER — Encounter
Admission: EM | Disposition: A | Discharge status: Hospice | Payer: Self-pay | Source: Home / Self Care | Attending: Internal Medicine

## 2023-03-09 ENCOUNTER — Inpatient Hospital Stay: Payer: Medicare HMO | Admitting: Certified Registered"

## 2023-03-09 ENCOUNTER — Other Ambulatory Visit: Payer: Self-pay

## 2023-03-09 ENCOUNTER — Encounter: Payer: Self-pay | Admitting: Internal Medicine

## 2023-03-09 ENCOUNTER — Inpatient Hospital Stay: Payer: Medicare HMO

## 2023-03-09 DIAGNOSIS — M1611 Unilateral primary osteoarthritis, right hip: Secondary | ICD-10-CM

## 2023-03-09 DIAGNOSIS — K922 Gastrointestinal hemorrhage, unspecified: Secondary | ICD-10-CM | POA: Diagnosis not present

## 2023-03-09 DIAGNOSIS — K2100 Gastro-esophageal reflux disease with esophagitis, without bleeding: Secondary | ICD-10-CM

## 2023-03-09 DIAGNOSIS — K2210 Ulcer of esophagus without bleeding: Secondary | ICD-10-CM

## 2023-03-09 HISTORY — PX: ESOPHAGEAL BRUSHING: SHX6842

## 2023-03-09 HISTORY — PX: ESOPHAGOGASTRODUODENOSCOPY (EGD) WITH PROPOFOL: SHX5813

## 2023-03-09 LAB — BASIC METABOLIC PANEL
Anion gap: 7 (ref 5–15)
BUN: 13 mg/dL (ref 8–23)
CO2: 24 mmol/L (ref 22–32)
Calcium: 7.8 mg/dL — ABNORMAL LOW (ref 8.9–10.3)
Chloride: 104 mmol/L (ref 98–111)
Creatinine, Ser: 1.07 mg/dL (ref 0.61–1.24)
GFR, Estimated: >60 mL/min (ref >60)
Glucose, Bld: 97 mg/dL (ref 70–99)
Potassium: 3.6 mmol/L (ref 3.5–5.1)
Sodium: 135 mmol/L (ref 135–145)

## 2023-03-09 LAB — CBC
HCT: 26.3 % — ABNORMAL LOW (ref 39.0–52.0)
HCT: 27.2 % — ABNORMAL LOW (ref 39.0–52.0)
Hemoglobin: 8.6 g/dL — ABNORMAL LOW (ref 13.0–17.0)
Hemoglobin: 9.1 g/dL — ABNORMAL LOW (ref 13.0–17.0)
MCH: 31.6 pg (ref 26.0–34.0)
MCH: 32.2 pg (ref 26.0–34.0)
MCHC: 32.7 g/dL (ref 30.0–36.0)
MCHC: 33.5 g/dL (ref 30.0–36.0)
MCV: 96.7 fL (ref 80.0–100.0)
MCV: 96.1 fL (ref 80.0–100.0)
Platelets: 370 10*3/uL (ref 150–400)
Platelets: 390 10*3/uL (ref 150–400)
RBC: 2.72 MIL/uL — ABNORMAL LOW (ref 4.22–5.81)
RBC: 2.83 MIL/uL — ABNORMAL LOW (ref 4.22–5.81)
RDW: 14.8 % (ref 11.5–15.5)
RDW: 14.4 % (ref 11.5–15.5)
WBC: 9.3 10*3/uL (ref 4.0–10.5)
WBC: 9.5 10*3/uL (ref 4.0–10.5)
nRBC: 0 % (ref 0.0–0.2)
nRBC: 0 % (ref 0.0–0.2)

## 2023-03-09 LAB — BPAM RBC
Blood Product Expiration Date: 202412212359
ISSUE DATE / TIME: 202412161429
Unit Type and Rh: 6200

## 2023-03-09 LAB — TYPE AND SCREEN
ABO/RH(D): A POS
Antibody Screen: NEGATIVE
Unit division: 0

## 2023-03-09 LAB — KOH PREP: KOH Prep: NONE SEEN

## 2023-03-09 SURGERY — ESOPHAGOGASTRODUODENOSCOPY (EGD) WITH PROPOFOL
Anesthesia: General

## 2023-03-09 MED ORDER — PIPERACILLIN-TAZOBACTAM 3.375 G IVPB
3.3750 g | Freq: Three times a day (TID) | INTRAVENOUS | Status: DC
  Administered 2023-03-09 – 2023-03-10 (×3): 3.375 g via INTRAVENOUS
  Filled 2023-03-09 (×3): qty 50

## 2023-03-09 MED ORDER — PHENYLEPHRINE HCL-NACL 20-0.9 MG/250ML-% IV SOLN
INTRAVENOUS | Status: DC | PRN
  Administered 2023-03-09: 80 ug via INTRAVENOUS

## 2023-03-09 MED ORDER — GADOBUTROL 1 MMOL/ML IV SOLN
7.0000 mL | Freq: Once | INTRAVENOUS | Status: AC | PRN
  Administered 2023-03-09: 7 mL via INTRAVENOUS

## 2023-03-09 MED ORDER — LIDOCAINE HCL (CARDIAC) PF 100 MG/5ML IV SOSY
PREFILLED_SYRINGE | INTRAVENOUS | Status: DC | PRN
  Administered 2023-03-09: 100 mg via INTRAVENOUS

## 2023-03-09 MED ORDER — LIDOCAINE HCL (PF) 2 % IJ SOLN
INTRAMUSCULAR | Status: AC
  Filled 2023-03-09: qty 5

## 2023-03-09 MED ORDER — SUCRALFATE 1 GM/10ML PO SUSP
1.0000 g | Freq: Three times a day (TID) | ORAL | Status: DC
  Administered 2023-03-09 (×2): 1 g via ORAL
  Filled 2023-03-09 (×3): qty 10

## 2023-03-09 MED ORDER — PROPOFOL 10 MG/ML IV BOLUS
INTRAVENOUS | Status: DC | PRN
  Administered 2023-03-09: 50 mg via INTRAVENOUS
  Administered 2023-03-09: 20 mg via INTRAVENOUS

## 2023-03-09 MED ORDER — SODIUM CHLORIDE 0.9 % IV SOLN: INTRAVENOUS | Status: DC

## 2023-03-09 MED ORDER — DEXMEDETOMIDINE HCL IN NACL 80 MCG/20ML IV SOLN
INTRAVENOUS | Status: DC | PRN
  Administered 2023-03-09: 8 ug via INTRAVENOUS

## 2023-03-09 MED ORDER — SODIUM CHLORIDE 0.9 % IV SOLN: INTRAVENOUS | Status: DC | PRN

## 2023-03-09 NOTE — Progress Notes (Signed)
Progress Note   Patient: Garrett Moore:[id number] DOB: Aug 06, 1940 DOA: 03/08/2023     1 DOS: the patient was seen and examined on 03/09/2023   Brief hospital course: Garrett Moore is a 82 y.o. male with medical history significant of CAD s/p PCI with DES to RCA (1985), AAA s/p endograft repair (2022), PAD, moderate aortic valve stenosis, hyperlipidemia, atrial fibrillation not on AC, COPD, HFpEF, CKD stage IIIa, BPH, who presents to the ED due to low blood pressure. [name] states that since his recent hip replacement approximately 10 days ago on 12/5, he has been experiencing black stool.  He went to the ED on 12/8 for the symptoms and was treated symptomatically.  Patient seen by GI and underwent EGD that showed severe erosive esophagitis as a source of his melena  . Assessment and Plan: Upper GI bleed secondary to severe erosive esophagitis Patient is presenting with 10 days of melena in addition to 3 days of hematemesis that resolved consistent with upper GI bleed.  He is currently on a baby aspirin only, as he discontinued Plavix 1 week prior to hip surgery.  No other NSAID use. Plan of care discussed with gastroenterologist - Continue Protonix Patient is status post EGD-showed severe erosive esophagitis  Hepatic lesion MRI of the abdomen requested Findings discussed with patient's son as well as patient   Multiple pulmonary nodules This was discussed with patient as well as patient's son Patient will have to follow-up as an outpatient  Symptomatic anemia status post blood transfusion Patient presenting with dyspnea on exertion, generalized weakness and malaise consistent with symptomatic anemia.  Previous hemoglobin around 12, currently 8.3.  CTA was ordered, however low suspicion for PE at this time. EGD showed severe erosive esophagitis Continue management of esophagitis Outpatient follow-up with GI   Demand ischemia (HCC) Minimally elevated troponin with no active  chest pain and reassuring EKG.  Likely due to demand in the setting of acute blood loss anemia. No chest pains Troponin remained flat   Status post total hip replacement, left Recent total hip replacement on 12/5 with initial improvement in pain, however patient states pain has worsened over the last few days. Continue tramadol as needed - Continue PT/OT Plan of care discussed with orthopedics surgeon   Paroxysmal atrial fibrillation [facility]) Patient states he has had only 2 episodes of atrial fibrillation in the past, due to this, he has declined any anticoagulant use.  EKG demonstrating sinus rhythm.   Chronic diastolic CHF (congestive heart failure) (HCC) Previous history of HFpEF, however most recent echocardiogram noted EF of 60-65% no evidence of diastolic dysfunction.  Patient is not on any diuretics at home.  He appears euvolemic at this time.   COPD (chronic obstructive pulmonary disease) (HCC) No wheezing on examination and shortness of breath of suspected to be due to symptomatic anemia.  Patient is not on any bronchodilators at home.   - DuoNebs as needed   Stage 3a chronic kidney disease (CKD) (HCC) History of CKD stage IIIa with creatinine ranging between 1.06 and 1.50 in the last several years.  Currently within baseline range. Monitor BMP   CAD S/P percutaneous coronary angioplasty CAD s/p DES to RCA in the mid 1980s.  Denies any chest pain at this time.   - Hold home aspirin for now given active GI bleed   Advance Care Planning:   Code Status: Limited: Do not attempt resuscitation (DNR) -DNR-LIMITED -Do Not Intubate/DNI verified by patient with daughter-in-law at bedside.  Daughter-in-law confirms that patient has relayed these wishes to his family.   Consults: GI   Family Communication: Patient's are in law updated at bedside   Subjective:  Patient seen and examined at bedside this morning Denied worsening abdominal pain nausea vomiting or chest pain  Physical  Exam: Constitutional:      General: He is not in acute distress.    Appearance: He is normal weight.  HENT:     Head: Normocephalic and atraumatic.  Eyes:     Conjunctiva/sclera: Conjunctivae normal.     Pupils: Pupils are equal, round, and reactive to light.  Cardiovascular: Systolic murmur heard Pulmonary:     Effort: Pulmonary effort is normal. No respiratory distress.   Abdominal:     General: Bowel sounds are normal. There is no distension.     Palpations: Abdomen is soft.  Musculoskeletal:     Right lower leg: No edema.     Left lower leg: No edema.  Skin:    General: Skin is warm.  Neurological:     Mental Status: He is alert and oriented to person, place, and time. Mental status is at baseline.  Psychiatric:        Mood and Affect: Mood normal.        Behavior: Behavior normal.   Vitals:   03/09/23 1351 03/09/23 1401 03/09/23 1411 03/09/23 1622  BP: 129/71 127/64 129/83 (!) 125/59  Pulse: 77 77 89 80  Resp: 18 18 18 16   Temp:    (!) 97.5 F (36.4 C)  TempSrc:    Oral  SpO2: 99% 100% 100% 100%  Weight:      Height:        Data Reviewed:    Latest Ref Rng & Units 03/09/2023    5:16 AM 03/08/2023    9:09 PM 03/08/2023   11:36 AM  CBC  WBC 4.0 - 10.5 K/uL 9.3  10.6  13.9   Hemoglobin 13.0 - 17.0 g/dL 8.6  8.6  8.4   Hematocrit 39.0 - 52.0 % 26.3  25.4  26.4   Platelets 150 - 400 K/uL 370  377  435        Latest Ref Rng & Units 03/09/2023    5:16 AM 03/08/2023   11:36 AM 02/28/2023    5:13 PM  BMP  Glucose 70 - 99 mg/dL 97  161  096   BUN 8 - 23 mg/dL 13  16  39   Creatinine 0.61 - 1.24 mg/dL 0.45  4.09  8.11   Sodium 135 - 145 mmol/L 135  133  138   Potassium 3.5 - 5.1 mmol/L 3.6  3.2  3.8   Chloride 98 - 111 mmol/L 104  101  104   CO2 22 - 32 mmol/L 24  23  23    Calcium 8.9 - 10.3 mg/dL 7.8  7.7  7.9      Family Communication: Discussed with patient's son at bedside  Disposition: Status is: Inpatient   Time spent: 20  minutes  Author: Loyce Dys, MD 03/09/2023 4:56 PM  For on call review [email or web address].

## 2023-03-09 NOTE — Op Note (Signed)
[facility] Gastroenterology Patient Name: Garrett Moore Procedure Date: 03/09/2023 12:13 PM MRN: [id number] Account #: [id number] Date of Birth: 01/15/1941 Admit Type: Outpatient Age: 82 Room: [facility] ENDO ROOM 3 Gender: Male Note Status: Finalized Instrument Name: Upper Endoscope [id number] Procedure:             Upper GI endoscopy Indications:           Iron deficiency anemia secondary to chronic blood                         loss, Melena Providers:             Toney Reil MD, MD Referring MD:          Gildardo Cranker (Referring MD) Medicines:             General Anesthesia Complications:         No immediate complications. Estimated blood loss: None. Procedure:             Pre-Anesthesia Assessment:                        - Prior to the procedure, a History and Physical was                         performed, and patient medications and allergies were                         reviewed. The patient is competent. The risks and                         benefits of the procedure and the sedation options and                         risks were discussed with the patient. All questions                         were answered and informed consent was obtained.                         Patient identification and proposed procedure were                         verified by the physician, the nurse, the                         anesthesiologist, the anesthetist and the technician                         in the pre-procedure area in the procedure room in the                         endoscopy suite. Mental Status Examination: alert and                         oriented. Airway Examination: normal oropharyngeal                         airway and neck mobility. Respiratory Examination:  clear to auscultation. CV Examination: normal.                         Prophylactic Antibiotics: The patient does not require                         prophylactic antibiotics.  Prior Anticoagulants: The                         patient has taken no anticoagulant or antiplatelet                         agents. ASA Grade Assessment: III - A patient with                         severe systemic disease. After reviewing the risks and                         benefits, the patient was deemed in satisfactory                         condition to undergo the procedure. The anesthesia                         plan was to use general anesthesia. Immediately prior                         to administration of medications, the patient was                         re-assessed for adequacy to receive sedatives. The                         heart rate, respiratory rate, oxygen saturations,                         blood pressure, adequacy of pulmonary ventilation, and                         response to care were monitored throughout the                         procedure. The physical status of the patient was                         re-assessed after the procedure.                        After obtaining informed consent, the endoscope was                         passed under direct vision. Throughout the procedure,                         the patient's blood pressure, pulse, and oxygen                         saturations were monitored continuously. The Endoscope  was introduced through the mouth, and advanced to the                         second part of duodenum. The upper GI endoscopy was                         accomplished without difficulty. The patient tolerated                         the procedure well. Findings:      The duodenal bulb and second portion of the duodenum were normal.      The entire examined stomach was normal.      The cardia and gastric fundus were normal on retroflexion.      Esophagogastric landmarks were identified: the gastroesophageal junction       was found at 40 cm from the incisors.      LA Grade D (one or more mucosal breaks  involving at least 75% of       esophageal circumference) esophagitis with no bleeding was found in the       entire esophagus. Cells for cytology were obtained by brushing.       Estimated blood loss: none. Impression:            - Normal duodenal bulb and second portion of the                         duodenum.                        - Normal stomach.                        - Esophagogastric landmarks identified.                        - LA Grade D reflux esophagitis with no bleeding.                         Cells for cytology obtained. Recommendation:        - Return patient to hospital ward for possible                         discharge same day.                        - Cardiac diet today.                        - Continue present medications.                        - Follow an antireflux regimen for the rest of the                         patient's life.                        - Use Prilosec (omeprazole) 40 mg PO BID indefinitely.                        - Repeat upper  endoscopy in 3 months to check healing                         and to evaluate the response to therapy. Procedure Code(s):     --- Professional ---                        [phone], Esophagogastroduodenoscopy, flexible,                         transoral; diagnostic, including collection of                         specimen(s) by brushing or washing, when performed                         (separate procedure) Diagnosis Code(s):     --- Professional ---                        K21.00, Gastro-esophageal reflux disease with                         esophagitis, without bleeding                        D50.0, Iron deficiency anemia secondary to blood loss                         (chronic)                        K92.1, Melena (includes Hematochezia) CPT copyright 2022 American Medical Association. All rights reserved. The codes documented in this report are preliminary and upon coder review may  be revised to meet current  compliance requirements. Dr. Libby Maw Toney Reil MD, MD 03/09/2023 12:39:37 PM This report has been signed electronically. Number of Addenda: 0 Note Initiated On: 03/09/2023 12:13 PM Estimated Blood Loss:  Estimated blood loss: none.      [facility]

## 2023-03-09 NOTE — ED Notes (Signed)
Called report to endo.

## 2023-03-09 NOTE — ED Notes (Signed)
Patient taking to endo procedure by endo RN. Zosyn paused by endo RN. Zosyn went with patient to procedure.

## 2023-03-09 NOTE — Progress Notes (Deleted)
This note is to let the team at [facility] know that Dr. Charlann Boxer and I are available as needed via phone/secure chat for his orthopaedic concerns.   We have been discussing with patient and family over the past few weeks regarding his symptoms.   Rosalene Billings, PA-C Orthopedic Surgery [facility] [place] [phone]

## 2023-03-09 NOTE — Anesthesia Preprocedure Evaluation (Signed)
Anesthesia Evaluation  Patient identified by MRN, date of birth, ID band Patient awake    Reviewed: Allergy & Precautions, H&P , NPO status , Patient's Chart, lab work & pertinent test results  History of Anesthesia Complications Negative for: history of anesthetic complications  Airway Mallampati: II  TM Distance: >3 FB     Dental  (+) Upper Dentures, Lower Dentures, Dental Advidsory Given   Pulmonary shortness of breath (found to have low blood count), neg sleep apnea, COPD, neg recent URI, former smoker   breath sounds clear to auscultation       Cardiovascular (-) hypertension(-) angina + CAD, + Past MI, + Peripheral Vascular Disease and +CHF  (-) Cardiac Stents (-) dysrhythmias + Valvular Problems/Murmurs AS  Rhythm:regular Rate:Normal  AAA unruptured HFpEF, Grade 1 diastolic dysfunction Moderate AS   Neuro/Psych negative neurological ROS  negative psych ROS   GI/Hepatic Neg liver ROS,GERD  ,,  Endo/Other  negative endocrine ROS    Renal/GU Renal disease (CKD)     Musculoskeletal   Abdominal   Peds  Hematology  (+) Blood dyscrasia, anemia   Anesthesia Other Findings Past Medical History: 05/10/2020: Aneurysm of infrarenal abdominal aorta (HCC)     Comment:  5.9 cm No date: Aortic atherosclerosis (HCC) 02/08/2018: Aortic valvar stenosis     Comment:  Mild to Moderate Noted on ECHO No date: Basal cell carcinoma     Comment:  Face No date: Carotid artery stenosis No date: CKD (chronic kidney disease), stage III (HCC) No date: COPD (chronic obstructive pulmonary disease) (HCC) No date: Coronary artery disease No date: DVT (deep venous thrombosis) (HCC)     Comment:  age 82, leg after Tonsillectomy No date: Family history of adverse reaction to anesthesia     Comment:  mother under anesthesia for extended period for AAA               memory problems/dementia after 01/2018: Grade I diastolic dysfunction No  date: Heart murmur No date: Hyperlipidemia 07/1983: Myocardial infarction (HCC) No date: OA (osteoarthritis)  Past Surgical History: 08/14/1983: CARDIAC CATHETERIZATION 01/23/2011: CARDIOVASCULAR STRESS TEST     Comment:  normal myocardial perfusion scan demonstrating an               attenuation artifact in the inferior region of the               myocardium. no ischemia or infarct/scar seen in remaining              myocardium 05/21/2006: CAROTID DUPLEX     Comment:  right bulb-0-49% diameter reduction; left bulb and               proximal ICA-0-49% diameter reduction 01/2019: CATARACT EXTRACTION, BILATERAL 07/1983: CORONARY ANGIOPLASTY     Comment:  PTCA Prox RCA 07/21/2012: DOPPLER ECHOCARDIOGRAPHY     Comment:  EF 50-55%, aortic valve noncoronary cusp mobility was               severely restricted. No date: ROTATOR CUFF REPAIR; Left 07/1983: TEMPORARY PACEMAKER No date: TONSILLECTOMY 02/21/2019: TOTAL HIP ARTHROPLASTY; Left     Comment:  Procedure: TOTAL HIP ARTHROPLASTY ANTERIOR APPROACH;                Surgeon: Durene Romans, MD;  Location: [facility] ORS;  Service:               Orthopedics;  Laterality: Left;  70 mins     Reproductive/Obstetrics negative OB ROS  Anesthesia Physical Anesthesia Plan  ASA: 3  Anesthesia Plan: General   Post-op Pain Management:    Induction: Intravenous  PONV Risk Score and Plan: 2 and Treatment may vary due to age or medical condition, Propofol infusion and TIVA  Airway Management Planned: Natural Airway and Nasal Cannula  Additional Equipment:   Intra-op Plan:   Post-operative Plan:   Informed Consent: I have reviewed the patients History and Physical, chart, labs and discussed the procedure including the risks, benefits and alternatives for the proposed anesthesia with the patient or authorized representative who has indicated his/her understanding and acceptance.     Dental  Advisory Given  Plan Discussed with: Anesthesiologist, CRNA and Surgeon  Anesthesia Plan Comments: (Patient consented for risks of anesthesia including but not limited to:  - adverse reactions to medications - damage to eyes, teeth, lips or other oral mucosa - nerve damage due to positioning  - sore throat or hoarseness - Damage to heart, brain, nerves, lungs, other parts of body or loss of life  Patient voiced understanding.)        Anesthesia Quick Evaluation

## 2023-03-09 NOTE — Transfer of Care (Signed)
Immediate Anesthesia Transfer of Care Note  Patient: Garrett Moore  Procedure(s) Performed: ESOPHAGOGASTRODUODENOSCOPY (EGD) WITH PROPOFOL  Patient Location: Endoscopy Unit  Anesthesia Type:General  Level of Consciousness: drowsy  Airway & Oxygen Therapy: Patient Spontanous Breathing  Post-op Assessment: Report given to RN and Post -op Vital signs reviewed and stable  Post vital signs: Reviewed and stable  Last Vitals:  Vitals Value Taken Time  BP 139/42 03/09/23 1241  Temp 36.7 C 03/09/23 1241  Pulse 67 03/09/23 1241  Resp 20 03/09/23 1241  SpO2 100 % 03/09/23 1241  Vitals shown include unfiled device data.  Last Pain:  Vitals:   03/09/23 1241  TempSrc: Temporal  PainSc:          Complications: No notable events documented.

## 2023-03-09 NOTE — Discharge Summary (Signed)
Patient ID: Garrett Moore MRN: [id number] DOB/AGE: [birth year] 82 y.o.  Admit date: 02/25/2023 Discharge date: 02/26/2023  Admission Diagnoses:  Right hip osteoarthritis  Discharge Diagnoses:  Principal Problem:   S/P total right hip arthroplasty   Past Medical History:  Diagnosis Date   Aneurysm of infrarenal abdominal aorta (HCC) 02/08/2018   a.) TTE 02/08/2018: Ao root 38 mm, asc Ao 40 mm; b.) TTE 06/14/2020; asc Ao 28 mm; c.) CT hematuria 05/10/2020: infrarenal AAA measuring 5.9 cm; d.) s/p EVAR 07/03/2020; e.) CT A/P 10/14/2021: residual aneurysmal sac (s/p EVAR) measured 6.0 cm   Aortic atherosclerosis (HCC)    Aortic valvar stenosis 07/21/2012   a.) TTE 07/21/2012: EF 50-55%, mild AS (AVA = 1.67, MPG 9); b.) TTE 02/08/2018: EF 60-65%; mod AS (MPG 17); c.) TTE 06/14/2020: EF 60-65%, mod AS (MPG 26.8)   Atherosclerosis of native arteries of extremity with intermittent claudication (HCC)    Atrial fibrillation (HCC)    Basal cell carcinoma, face    BPH (benign prostatic hyperplasia)    Carotid artery stenosis 05/21/2006   a.) carotid US 05/21/2006: 0-49% BICA; b.) carotid US 06/10/2020: 1-39% RICA, total occ LICA   CKD (chronic kidney disease), stage III (HCC)    COPD (chronic obstructive pulmonary disease) (HCC)    Coronary artery disease 08/14/1983   a.) MI --> LHC 08/13/2016: CTO of the RCA --> PTCA performed resulting in 30% residual stenosis; aberrant takeoff of his RCA coming off somewhat high and posteriorly.   Diastolic dysfunction 02/08/2018   a.) TTE 02/08/2018: EF 60-65%, mild MAC,  triv TR, mild-mod AS, G1DD; b.) TTE 06/14/2020: EF 60-65%, mod AS, triv TR, mild AR, G1DD   Diverticulosis    Family history of adverse reaction to anesthesia    a.) extended GA course for mother --> postoperaitve memory problems/dementia   GERD (gastroesophageal reflux disease)    Glaucoma    Heart murmur    History of kidney stones    Hyperlipidemia    Iliac artery aneurysm,  bilateral (HCC) 07/03/2020   a.) s/p insertion of BILATERAL iliac artery stents   Myocardial infarction (HCC) 08/13/2016   a.) MI --> LHC 08/13/2016: CTO of the RCA --> PTCA performed resulting in 30% residual stenosis. Procedure complicated by procedural nausea, Patient went into IVR and then AV disaccociated rhythm. SBP dropped (80s). TVP floated with capture, which stabilized patient. ICU admission overnight.   OA (osteoarthritis)    Peripheral vascular disease (HCC)    right leg   Postoperative deep vein thrombosis (DVT) (HCC)    a.) postop age 47 following tonsillectomy (per patient report)    Surgeries: Procedure(s): TOTAL HIP ARTHROPLASTY ANTERIOR APPROACH on 02/25/2023   Consultants:   Discharged Condition: Improved  Hospital Course: Garrett Moore is an 82 y.o. male who was admitted 02/25/2023 for operative treatment ofS/P total right hip arthroplasty. Patient has severe unremitting pain that affects sleep, daily activities, and work/hobbies. After pre-op clearance the patient was taken to the operating room on 02/25/2023 and underwent  Procedure(s): TOTAL HIP ARTHROPLASTY ANTERIOR APPROACH.    Patient was given perioperative antibiotics:  Anti-infectives (From admission, onward)    Start     Dose/Rate Route Frequency Ordered Stop   02/26/23 0000  cefadroxil (DURICEF) 500 MG capsule        500 mg Oral 2 times daily 02/26/23 0942 03/05/23 2359   02/25/23 1715  ceFAZolin (ANCEF) IVPB 2g/100 mL premix        2 g 200 mL/hr over  30 Minutes Intravenous Every 6 hours 02/25/23 1621 02/26/23 0701   02/25/23 0815  ceFAZolin (ANCEF) IVPB 2g/100 mL premix        2 g 200 mL/hr over 30 Minutes Intravenous On call to O.R. 02/25/23 0813 02/25/23 1145        Patient was given sequential compression devices, early ambulation, and chemoprophylaxis to prevent DVT. Patient worked with PT and was meeting their goals regarding safe ambulation and transfers.  Patient benefited maximally from  hospital stay and there were no complications.    Recent vital signs: No data found.   Recent laboratory studies:  Recent Labs    03/08/23 1136 03/08/23 2109 03/09/23 0516  WBC 13.9* 10.6* 9.3  HGB 8.4* 8.6* 8.6*  HCT 26.4* 25.4* 26.3*  PLT 435* 377 370  NA 133*  --  135  K 3.2*  --  3.6  CL 101  --  104  CO2 23  --  24  BUN 16  --  13  CREATININE 1.25*  --  1.07  GLUCOSE 119*  --  97  INR 1.1  --   --   CALCIUM 7.7*  --  7.8*     Discharge Medications:   Allergies as of 02/26/2023       Reactions   Quinolones Other (See Comments)   Other reaction(s): Has aortic root dilatation   Sulfa Antibiotics Rash        Medication List     STOP taking these medications    clopidogrel 75 MG tablet Commonly known as: PLAVIX       TAKE these medications    aspirin 81 MG chewable tablet Chew 1 tablet (81 mg total) by mouth 2 (two) times daily for 28 days.   latanoprost 0.005 % ophthalmic solution Commonly known as: XALATAN Place 1 drop into the right eye at bedtime.   methocarbamol 500 MG tablet Commonly known as: ROBAXIN Take 1 tablet (500 mg total) by mouth every 6 (six) hours as needed for muscle spasms.   multivitamin with minerals Tabs tablet Take 1 tablet by mouth daily.   nicotine polacrilex 2 MG lozenge Commonly known as: COMMIT Take 1 mg by mouth as needed for smoking cessation.   polyethylene glycol 17 g packet Commonly known as: MIRALAX / GLYCOLAX Take 17 g by mouth 2 (two) times daily.   senna 8.6 MG Tabs tablet Commonly known as: SENOKOT Take 2 tablets (17.2 mg total) by mouth at bedtime for 14 days.   simvastatin 80 MG tablet Commonly known as: ZOCOR Take 80 mg by mouth at bedtime.   traMADol 50 MG tablet Commonly known as: ULTRAM Take 1-2 tablets (50-100 mg total) by mouth every 6 (six) hours as needed for severe pain (pain score 7-10) or moderate pain (pain score 4-6). What changed:  how much to take when to take this reasons to  take this       ASK your doctor about these medications    cefadroxil 500 MG capsule Commonly known as: DURICEF Take 1 capsule (500 mg total) by mouth 2 (two) times daily for 7 days. Ask about: Should I take this medication?               Discharge Care Instructions  (From admission, onward)           Start     Ordered   02/26/23 0000  Change dressing       Comments: Maintain surgical dressing until follow up in the  clinic. If the edges start to pull up, may reinforce with tape. If the dressing is no longer working, may remove and cover with gauze and tape, but must keep the area dry and clean.  Call with any questions or concerns.   02/26/23 4098            Diagnostic Studies: CT Angio Chest PE W and/or Wo Contrast Result Date: 03/08/2023 CLINICAL DATA:  LLQ abdominal pain; Pulmonary embolism (PE) suspected, high prob. Hypotension. Weakness. Suspected bleeding ulcer. EXAM: CT ANGIOGRAPHY CHEST CT ABDOMEN AND PELVIS WITH CONTRAST TECHNIQUE: Multidetector CT imaging of the chest was performed using the standard protocol during bolus administration of intravenous contrast. Multiplanar CT image reconstructions and MIPs were obtained to evaluate the vascular anatomy. Multidetector CT imaging of the abdomen and pelvis was performed using the standard protocol during bolus administration of intravenous contrast. RADIATION DOSE REDUCTION: This exam was performed according to the departmental dose-optimization program which includes automated exposure control, adjustment of the mA and/or kV according to patient size and/or use of iterative reconstruction technique. CONTRAST:  OMNIPAQUE IOHEXOL 300 MG/ML SOLN; 50mL OMNIPAQUE IOHEXOL 350 MG/ML SOLN COMPARISON:  CT angiography abdomen and pelvis from 07/05/202320, FINDINGS: CTA CHEST FINDINGS Cardiovascular: No evidence of embolism to the proximal subsegmental pulmonary artery level. Normal cardiac size. No pericardial effusion. No  aortic aneurysm. There are coronary artery calcifications, in keeping with coronary artery disease. There are also mild-to-moderate peripheral atherosclerotic vascular calcifications of thoracic aorta and its major branches. Mediastinum/Nodes: Visualized thyroid gland appears grossly unremarkable. No solid / cystic mediastinal masses. The esophagus is nondistended precluding optimal assessment. There is mild circumferential thickening of the lower thoracic esophagus, which is most likely seen in the settings of chronic gastroesophageal reflux disease versus esophagitis. No axillary, mediastinal or hilar lymphadenopathy by size criteria. Lungs/Pleura: The central tracheo-bronchial tree is patent. There are mild-to-moderate upper lobe predominant centrilobular emphysematous changes. There are patchy areas of linear, plate-like atelectasis and/or scarring throughout bilateral lungs. No mass or consolidation. No pleural effusion or pneumothorax. There are multiple lung nodules, described as follows: *There is a lobulated 7 x 11 mm nodule in the left lung lower lobe (series 6, image 67). Please see follow-up recommendations below. *There is a 6 x 6 mm lobulated noncalcified nodule in the right lung lower lobe (series 6, image 94). *There are additional smaller nodules throughout bilateral lungs (marked with electronic arrow sign on series 100). Musculoskeletal: In the right paramedian upper back, there is a 1.7 x 3.1 cm soft tissue structure centered in the skin/subcutaneous tissue, incompletely characterized on the current exam but favored to represent epidermal inclusion cyst. The visualized soft tissues of the chest wall are otherwise grossly unremarkable. No suspicious osseous lesions. There are mild to moderate multilevel degenerative changes in the visualized spine. Review of the MIP images confirms the above findings. CT ABDOMEN and PELVIS FINDINGS Hepatobiliary: The liver is normal in size. Non-cirrhotic  configuration. No suspicious mass. There is an ill-defined, hypoattenuating, approximately 1.2 x 2.3 cm area in the left hepatic lobe, segment 4B, which is incompletely characterized on the current examination. No such area was seen on the prior CT scan; however, this is favored to represent focal fatty infiltration. Further evaluation with multiphasic contrast-enhanced MRI abdomen is recommended. No intrahepatic or extrahepatic bile duct dilation. No calcified gallstones. Normal gallbladder wall thickness. No pericholecystic inflammatory changes. Pancreas: Unremarkable. No pancreatic ductal dilatation or surrounding inflammatory changes. Spleen: Within normal limits. No focal lesion. Adrenals/Urinary Tract: Adrenal  glands are unremarkable. No suspicious renal mass. There are at least 2 simple cysts in the left kidney with largest measuring up to 1.6 x 1.8 cm. Focal scarring noted in the left kidney upper pole. No nephroureterolithiasis or obstructive uropathy. Evaluation of the urinary bladder is nondiagnostic due to extensive streak artifacts from bilateral hip arthroplasty hardware. Stomach/Bowel: There is probable small sliding hiatal hernia. There is a diverticulum arising from the second part of duodenum. There is short segment (approximately 2 cm) of distal descending colon, exhibiting mild irregular wall thickening and pericolonic fat stranding on the background of several diverticula, compatible with acute uncomplicated diverticulitis. No walled off abscess or loculated collection. No pneumoperitoneum. No disproportionate dilation of small or large bowel loops to suggest bowel obstruction. Unremarkable appendix. Vascular/Lymphatic: No ascites or pneumoperitoneum. No abdominal or pelvic lymphadenopathy, by size criteria. No aneurysmal dilation of the major abdominal arteries. Redemonstration of patient's known aorto bi-iliac stent and aneurysm of native aorta measuring up to 5.3 x 6.5 cm. No significant  interval change in size of the aneurysm, when remeasured in similar fashion. No endoleak seen. 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 Reproductive: Prostate is not diagnostically evaluated due to extensive streak artifacts. Other: There is a tiny fat containing left inguinal hernia. The soft tissues and abdominal wall are otherwise unremarkable. Musculoskeletal: No suspicious osseous lesions. There are mild - moderate multilevel degenerative changes in the visualized spine. Note is made of bilateral hip arthroplasty. Review of the MIP images confirms the above findings. IMPRESSION: 1. No embolism to the proximal subsegmental pulmonary artery level. 2. There are several lung nodules with largest 7 x 11 mm solid noncalcified nodule in the left lung lower lobe. Please see follow-up recommendations below. 3. Acute uncomplicated distal descending colon diverticulitis, as described above. 4. There is a 1.2 x 1.3 cm hypoattenuating area in the left hepatic lobe, segment IVb, incompletely characterized on the current exam. Further evaluation with nonemergent multiphasic contrast-enhanced MRI abdomen is recommended. 5. Multiple other nonacute observations, as described above. Aortic aneurysm NOS (ICD10-I71.9). Aortic Atherosclerosis (ICD10-I70.0) and Emphysema (ICD10-J43.9). Pulmonary nodule follow-up recommendation: Solid lung nodule measuring more than 8 mm: - LOW RISK: Consider CT at 3 months, PET / CT or tissue sampling, - HIGHER RISK: : Consider CT at 3 months, PET / CT or tissue sampling. Recommendations for evaluation of incidental nodules derived from guidelines developed by the Fleischner Society ( Radiology 2017; [phone]). These guidelines apply to incidental nodules, which can be managed according to the specific recommendations. These guidelines do not apply to patient's younger than 35 years, immunocompromised patients, or  patients with cancer. For lung cancer screening, adherence to the existing [employer] of radiology lung CT Screening Reporting and Data System (lung-RADS) guidelines is recommended. High risk factors include older age, heavy smoking, larger nodule size, irregular or spiculated margins and upper lobe location. Clinical risk factors include smoking, exposure to other carcinogens, emphysema, fibrosis, upper lobe location, family history of lung cancer, age and sex. Electronically Signed   By: Jules Schick M.D.   On: 03/08/2023 14:36   CT ABDOMEN PELVIS W CONTRAST Result Date: 03/08/2023 CLINICAL DATA:  LLQ abdominal pain; Pulmonary embolism (PE) suspected, high prob. Hypotension. Weakness. Suspected bleeding ulcer. EXAM: CT ANGIOGRAPHY CHEST CT ABDOMEN AND PELVIS WITH CONTRAST TECHNIQUE: Multidetector CT imaging of the chest was performed using the standard protocol during bolus administration of intravenous contrast. Multiplanar CT image reconstructions and MIPs were  obtained to evaluate the vascular anatomy. Multidetector CT imaging of the abdomen and pelvis was performed using the standard protocol during bolus administration of intravenous contrast. RADIATION DOSE REDUCTION: This exam was performed according to the departmental dose-optimization program which includes automated exposure control, adjustment of the mA and/or kV according to patient size and/or use of iterative reconstruction technique. CONTRAST:  OMNIPAQUE IOHEXOL 300 MG/ML SOLN; 50mL OMNIPAQUE IOHEXOL 350 MG/ML SOLN COMPARISON:  CT angiography abdomen and pelvis from 07/05/202320, FINDINGS: CTA CHEST FINDINGS Cardiovascular: No evidence of embolism to the proximal subsegmental pulmonary artery level. Normal cardiac size. No pericardial effusion. No aortic aneurysm. There are coronary artery calcifications, in keeping with coronary artery disease. There are also mild-to-moderate peripheral atherosclerotic vascular calcifications of  thoracic aorta and its major branches. Mediastinum/Nodes: Visualized thyroid gland appears grossly unremarkable. No solid / cystic mediastinal masses. The esophagus is nondistended precluding optimal assessment. There is mild circumferential thickening of the lower thoracic esophagus, which is most likely seen in the settings of chronic gastroesophageal reflux disease versus esophagitis. No axillary, mediastinal or hilar lymphadenopathy by size criteria. Lungs/Pleura: The central tracheo-bronchial tree is patent. There are mild-to-moderate upper lobe predominant centrilobular emphysematous changes. There are patchy areas of linear, plate-like atelectasis and/or scarring throughout bilateral lungs. No mass or consolidation. No pleural effusion or pneumothorax. There are multiple lung nodules, described as follows: *There is a lobulated 7 x 11 mm nodule in the left lung lower lobe (series 6, image 67). Please see follow-up recommendations below. *There is a 6 x 6 mm lobulated noncalcified nodule in the right lung lower lobe (series 6, image 94). *There are additional smaller nodules throughout bilateral lungs (marked with electronic arrow sign on series 100). Musculoskeletal: In the right paramedian upper back, there is a 1.7 x 3.1 cm soft tissue structure centered in the skin/subcutaneous tissue, incompletely characterized on the current exam but favored to represent epidermal inclusion cyst. The visualized soft tissues of the chest wall are otherwise grossly unremarkable. No suspicious osseous lesions. There are mild to moderate multilevel degenerative changes in the visualized spine. Review of the MIP images confirms the above findings. CT ABDOMEN and PELVIS FINDINGS Hepatobiliary: The liver is normal in size. Non-cirrhotic configuration. No suspicious mass. There is an ill-defined, hypoattenuating, approximately 1.2 x 2.3 cm area in the left hepatic lobe, segment 4B, which is incompletely characterized on the  current examination. No such area was seen on the prior CT scan; however, this is favored to represent focal fatty infiltration. Further evaluation with multiphasic contrast-enhanced MRI abdomen is recommended. No intrahepatic or extrahepatic bile duct dilation. No calcified gallstones. Normal gallbladder wall thickness. No pericholecystic inflammatory changes. Pancreas: Unremarkable. No pancreatic ductal dilatation or surrounding inflammatory changes. Spleen: Within normal limits. No focal lesion. Adrenals/Urinary Tract: Adrenal glands are unremarkable. No suspicious renal mass. There are at least 2 simple cysts in the left kidney with largest measuring up to 1.6 x 1.8 cm. Focal scarring noted in the left kidney upper pole. No nephroureterolithiasis or obstructive uropathy. Evaluation of the urinary bladder is nondiagnostic due to extensive streak artifacts from bilateral hip arthroplasty hardware. Stomach/Bowel: There is probable small sliding hiatal hernia. There is a diverticulum arising from the second part of duodenum. There is short segment (approximately 2 cm) of distal descending colon, exhibiting mild irregular wall thickening and pericolonic fat stranding on the background of several diverticula, compatible with acute uncomplicated diverticulitis. No walled off abscess or loculated collection. No pneumoperitoneum. No disproportionate dilation of small or  large bowel loops to suggest bowel obstruction. Unremarkable appendix. Vascular/Lymphatic: No ascites or pneumoperitoneum. No abdominal or pelvic lymphadenopathy, by size criteria. No aneurysmal dilation of the major abdominal arteries. Redemonstration of patient's known aorto bi-iliac stent and aneurysm of native aorta measuring up to 5.3 x 6.5 cm. No significant interval change in size of the aneurysm, when remeasured in similar fashion. No endoleak seen. Redemonstration of aneurysmal dilation of bilateral common iliac artery as well, which is also  essentially unchanged. Reproductive: Prostate is not diagnostically evaluated due to extensive streak artifacts. Other: There is a tiny fat containing left inguinal hernia. The soft tissues and abdominal wall are otherwise unremarkable. Musculoskeletal: No suspicious osseous lesions. There are mild - moderate multilevel degenerative changes in the visualized spine. Note is made of bilateral hip arthroplasty. Review of the MIP images confirms the above findings. IMPRESSION: 1. No embolism to the proximal subsegmental pulmonary artery level. 2. There are several lung nodules with largest 7 x 11 mm solid noncalcified nodule in the left lung lower lobe. Please see follow-up recommendations below. 3. Acute uncomplicated distal descending colon diverticulitis, as described above. 4. There is a 1.2 x 1.3 cm hypoattenuating area in the left hepatic lobe, segment IVb, incompletely characterized on the current exam. Further evaluation with nonemergent multiphasic contrast-enhanced MRI abdomen is recommended. 5. Multiple other nonacute observations, as described above. Aortic aneurysm NOS (ICD10-I71.9). Aortic Atherosclerosis (ICD10-I70.0) and Emphysema (ICD10-J43.9). Pulmonary nodule follow-up recommendation: Solid lung nodule measuring more than 8 mm: - LOW RISK: Consider CT at 3 months, PET / CT or tissue sampling, - HIGHER RISK: : Consider CT at 3 months, PET / CT or tissue sampling. Recommendations for evaluation of incidental nodules derived from guidelines developed by the Fleischner Society ( Radiology 2017; [phone]). These guidelines apply to incidental nodules, which can be managed according to the specific recommendations. These guidelines do not apply to patient's younger than 35 years, immunocompromised patients, or patients with cancer. For lung cancer screening, adherence to the existing [employer] of radiology lung CT Screening Reporting and Data System (lung-RADS) guidelines is recommended. High risk  factors include older age, heavy smoking, larger nodule size, irregular or spiculated margins and upper lobe location. Clinical risk factors include smoking, exposure to other carcinogens, emphysema, fibrosis, upper lobe location, family history of lung cancer, age and sex. Electronically Signed   By: Jules Schick M.D.   On: 03/08/2023 14:36   DG Pelvis Portable Result Date: 02/25/2023 CLINICAL DATA:  Status post total right hip arthroplasty. EXAM: PORTABLE PELVIS 1-2 VIEWS COMPARISON:  CT abdomen and pelvis 09/24/2021 FINDINGS: Interval total right hip arthroplasty. Redemonstration of total left hip arthroplasty. No perihardware lucency is seen to indicate hardware failure or loosening. The bilateral sacroiliac and the pubic symphysis joint spaces are maintained. No acute fracture or dislocation. Aorto bi-iliac and right internal iliac stent grafts are again noted. Surgical clips overlie the prostate bed. IMPRESSION: Interval total right hip arthroplasty without evidence of hardware failure. Electronically Signed   By: Neita Garnet M.D.   On: 02/25/2023 14:31   DG HIP UNILAT WITH PELVIS 1V RIGHT Result Date: 02/25/2023 CLINICAL DATA:  Intraoperative fluoroscopy for total right hip arthroplasty. EXAM: DG HIP (WITH OR WITHOUT PELVIS) 1V RIGHT COMPARISON:  CT abdomen and pelvis 08/14/2020 FINDINGS: Images were performed intraoperatively without the presence of a radiologist. The patient is undergoing total right hip arthroplasty. Partial visualization of prior remote total left hip arthroplasty. No hardware complication is seen. Total fluoroscopy images: 2 Total  fluoroscopy time: 11 seconds Total dose: Radiation Exposure Index (as provided by the fluoroscopic device): 1.43 mGy air Kerma Please see intraoperative findings for further detail. IMPRESSION: Intraoperative fluoroscopy for total right hip arthroplasty. Electronically Signed   By: Neita Garnet M.D.   On: 02/25/2023 14:28   DG C-Arm 1-60 Min-No  Report Result Date: 02/25/2023 Fluoroscopy was utilized by the requesting physician.  No radiographic interpretation.    Disposition: Discharge disposition: 01-Home or Self Care       Discharge Instructions     Call MD / Call 911   Complete by: As directed    If you experience chest pain or shortness of breath, CALL 911 and be transported to the hospital emergency room.  If you develope a fever above 101 F, pus (white drainage) or increased drainage or redness at the wound, or calf pain, call your surgeon's office.   Change dressing   Complete by: As directed    Maintain surgical dressing until follow up in the clinic. If the edges start to pull up, may reinforce with tape. If the dressing is no longer working, may remove and cover with gauze and tape, but must keep the area dry and clean.  Call with any questions or concerns.   Constipation Prevention   Complete by: As directed    Drink plenty of fluids.  Prune juice may be helpful.  You may use a stool softener, such as Colace (over the counter) 100 mg twice a day.  Use MiraLax (over the counter) for constipation as needed.   Diet - low sodium heart healthy   Complete by: As directed    Increase activity slowly as tolerated   Complete by: As directed    Weight bearing as tolerated with assist device (walker, cane, etc) as directed, use it as long as suggested by your surgeon or therapist, typically at least 4-6 weeks.   Post-operative opioid taper instructions:   Complete by: As directed    POST-OPERATIVE OPIOID TAPER INSTRUCTIONS: It is important to wean off of your opioid medication as soon as possible. If you do not need pain medication after your surgery it is ok to stop day one. Opioids include: Codeine, Hydrocodone(Norco, Vicodin), Oxycodone(Percocet, oxycontin) and hydromorphone amongst others.  Long term and even short term use of opiods can cause: Increased pain response Dependence Constipation Depression Respiratory  depression And more.  Withdrawal symptoms can include Flu like symptoms Nausea, vomiting And more Techniques to manage these symptoms Hydrate well Eat regular healthy meals Stay active Use relaxation techniques(deep breathing, meditating, yoga) Do Not substitute Alcohol to help with tapering If you have been on opioids for less than two weeks and do not have pain than it is ok to stop all together.  Plan to wean off of opioids This plan should start within one week post op of your joint replacement. Maintain the same interval or time between taking each dose and first decrease the dose.  Cut the total daily intake of opioids by one tablet each day Next start to increase the time between doses. The last dose that should be eliminated is the evening dose.      TED hose   Complete by: As directed    Use stockings (TED hose) for 2 weeks on both leg(s).  You may remove them at night for sleeping.        Follow-up Information     Durene Romans, MD. Schedule an appointment as soon as possible for a visit  in 2 week(s).   Specialty: Orthopedic Surgery Contact information: [address] [place] [address] [phone]                  Signed: Cassandria Anger 03/09/2023, 7:08 AM

## 2023-03-09 NOTE — Plan of Care (Signed)
  Problem: Clinical Measurements: Goal: Ability to maintain clinical measurements within normal limits will improve Outcome: Progressing   

## 2023-03-10 ENCOUNTER — Encounter: Payer: Self-pay | Admitting: Gastroenterology

## 2023-03-10 DIAGNOSIS — K922 Gastrointestinal hemorrhage, unspecified: Secondary | ICD-10-CM | POA: Diagnosis not present

## 2023-03-10 LAB — CBC WITH DIFFERENTIAL/PLATELET
Abs Immature Granulocytes: 0.07 10*3/uL (ref 0.00–0.07)
Basophils Absolute: 0 10*3/uL (ref 0.0–0.1)
Basophils Relative: 0 %
Eosinophils Absolute: 0.3 10*3/uL (ref 0.0–0.5)
Eosinophils Relative: 3 %
HCT: 25.1 % — ABNORMAL LOW (ref 39.0–52.0)
Hemoglobin: 8.4 g/dL — ABNORMAL LOW (ref 13.0–17.0)
Immature Granulocytes: 1 %
Lymphocytes Relative: 10 %
Lymphs Abs: 1 10*3/uL (ref 0.7–4.0)
MCH: 31.6 pg (ref 26.0–34.0)
MCHC: 33.5 g/dL (ref 30.0–36.0)
MCV: 94.4 fL (ref 80.0–100.0)
Monocytes Absolute: 1.3 10*3/uL — ABNORMAL HIGH (ref 0.1–1.0)
Monocytes Relative: 13 %
Neutro Abs: 7.2 10*3/uL (ref 1.7–7.7)
Neutrophils Relative %: 73 %
Platelets: 393 10*3/uL (ref 150–400)
RBC: 2.66 MIL/uL — ABNORMAL LOW (ref 4.22–5.81)
RDW: 13.9 % (ref 11.5–15.5)
WBC: 9.9 10*3/uL (ref 4.0–10.5)
nRBC: 0 % (ref 0.0–0.2)

## 2023-03-10 LAB — BASIC METABOLIC PANEL
Anion gap: 5 (ref 5–15)
BUN: 11 mg/dL (ref 8–23)
CO2: 26 mmol/L (ref 22–32)
Calcium: 7.8 mg/dL — ABNORMAL LOW (ref 8.9–10.3)
Chloride: 102 mmol/L (ref 98–111)
Creatinine, Ser: 1.25 mg/dL — ABNORMAL HIGH (ref 0.61–1.24)
GFR, Estimated: 58 mL/min — ABNORMAL LOW (ref >60)
Glucose, Bld: 105 mg/dL — ABNORMAL HIGH (ref 70–99)
Potassium: 3.2 mmol/L — ABNORMAL LOW (ref 3.5–5.1)
Sodium: 133 mmol/L — ABNORMAL LOW (ref 135–145)

## 2023-03-10 MED ORDER — PANTOPRAZOLE SODIUM 40 MG PO TBEC: 40.0000 mg | DELAYED_RELEASE_TABLET | Freq: Two times a day (BID) | ORAL | 11 refills | Status: DC

## 2023-03-10 MED ORDER — POTASSIUM CHLORIDE CRYS ER 20 MEQ PO TBCR: 40.0000 meq | EXTENDED_RELEASE_TABLET | ORAL | Status: DC

## 2023-03-10 NOTE — TOC Initial Note (Signed)
Transition of Care [facility]) - Initial/Assessment Note    Patient Details  Name: Garrett Moore MRN: [id number] Date of Birth: 15-Jun-1940  Transition of Care [facility]) CM/SW Contact:    Chapman Fitch, RN Phone Number: 03/10/2023, 11:09 AM  Clinical Narrative:                    Met with patient and son Kathlene November at bedside  Admitted for: GI bleed Admitted from: home with wife PCP: Tenny Craw Current home health/prior home health/DME: RW, cane, electric WC, WC, BSC  Patient requesting home with hospice services.  Notified by MD.  Patient and son state they prefer [facility].  [facility] with [facility], contracting.    Son to transport at discharge      Patient Goals and CMS Choice            Expected Discharge Plan and Services         Expected Discharge Date: 03/10/23                                    Prior Living Arrangements/Services                       Activities of Daily Living   ADL Screening (condition at time of admission) Independently performs ADLs?: Yes (appropriate for developmental age) Is the patient deaf or have difficulty hearing?: Yes Does the patient have difficulty seeing, even when wearing glasses/contacts?: No Does the patient have difficulty concentrating, remembering, or making decisions?: No  Permission Sought/Granted                  Emotional Assessment              Admission diagnosis:  Upper GI bleed [K92.2] Patient Active Problem List   Diagnosis Date Noted   Erosive esophagitis 03/09/2023   Upper GI bleed 03/08/2023   Symptomatic anemia 03/08/2023   Demand ischemia (HCC) 03/08/2023   Melena 03/08/2023   S/P total right hip arthroplasty 02/25/2023   Carotid stenosis 01/24/2023   Moderate aortic stenosis 05/20/2022   Chronic diastolic CHF (congestive heart failure) (HCC) 05/20/2022   Paroxysmal atrial fibrillation (HCC) 05/20/2022   Vision loss of left eye 05/20/2022   Acute ischemic stroke  (HCC) 05/19/2022   Atherosclerosis of native arteries of extremity with intermittent claudication (HCC) 09/28/2021   Aortic valve disease 08/05/2020   CAD S/P percutaneous coronary angioplasty 08/05/2020   Basal cell carcinoma of skin 08/05/2020   Benign prostatic hyperplasia 08/05/2020   Stage 3a chronic kidney disease (CKD) (HCC) 08/05/2020   COPD (chronic obstructive pulmonary disease) (HCC) 08/05/2020   Frequent fecal incontinence 08/05/2020   Glaucoma 08/05/2020   Hyperlipidemia 08/05/2020   Other long term (current) drug therapy 08/05/2020   AAA (abdominal aortic aneurysm) (HCC) 07/03/2020   AAA (abdominal aortic aneurysm) without rupture (HCC) 05/26/2020   S/P left THA, AA 02/21/2019   Status post total hip replacement, left 02/21/2019   PCP:  Daisy Floro, MD Pharmacy:   [facility] [phone] - [address] [place] - [address] [place] [address] Phone: [phone] Fax: [phone]     Social Drivers of Health (SDOH) Social History: SDOH Screenings   Food Insecurity: No Food Insecurity (03/09/2023)  Housing: Unknown (03/09/2023)  Transportation Needs: No Transportation Needs (03/09/2023)  Utilities: Not At Risk (03/09/2023)  Tobacco Use: Medium Risk (03/09/2023)  SDOH Interventions:     Readmission Risk Interventions     No data to display

## 2023-03-10 NOTE — Plan of Care (Signed)

## 2023-03-10 NOTE — TOC CM/SW Note (Signed)
Transition of Care [facility]) - Inpatient Brief Assessment   Patient Details  Name: Garrett Moore MRN: [id number] Date of Birth: [birth year]  Transition of Care [facility]) CM/SW Contact:    Chapman Fitch, RN Phone Number: 03/10/2023, 9:31 AM   Clinical Narrative:  Transition of Care [facility]) Screening Note   Patient Details  Name: Garrett Moore Date of Birth: 09-12-1940   Transition [facility]) CM/SW Contact:    Chapman Fitch, RN Phone Number: 03/10/2023, 9:31 AM    Transition of Care Department [facility]) has reviewed patient and no TOC needs have been identified at this time.  If new patient transition needs arise, please place a TOC consult.     Transition of Care Asessment: Insurance and Status: Insurance coverage has been reviewed Patient has primary care physician: Yes     Prior/Current Home Services: No current home services Social Drivers of Health Review: SDOH reviewed no interventions necessary Readmission risk has been reviewed: Yes Transition of care needs: no transition of care needs at this time

## 2023-03-10 NOTE — Discharge Summary (Addendum)
Physician Discharge Summary   Patient: Garrett Moore MRN: [id number] DOB: Apr 28, 1940  Admit date:     03/08/2023  Discharge date: 03/10/23  Discharge Physician: Loyce Dys   PCP: Daisy Floro, MD   Recommendations at discharge:  Follow-up with PCP with CT scan concerning pulmonary nodule monitoring Follow-up with GI   Discharge Diagnoses: Upper GI bleed secondary to severe erosive esophagitis Hepatic lesion Multiple pulmonary nodules Symptomatic anemia status post blood transfusion Demand ischemia (HCC) Status post total hip replacement, left Paroxysmal atrial fibrillation (HCC) Chronic diastolic CHF (congestive heart failure) (HCC) COPD (chronic obstructive pulmonary disease) (HCC) Stage 3a chronic kidney disease (CKD) (HCC) CAD S/P percutaneous coronary angioplasty   Hospital Course: Garrett Moore is a 82 y.o. male with medical history significant of CAD s/p PCI with DES to RCA (1985), AAA s/p endograft repair (2022), PAD, moderate aortic valve stenosis, hyperlipidemia, atrial fibrillation not on AC, COPD, HFpEF, CKD stage IIIa, BPH, who presents to the ED due to low blood pressure. Garrett Moore states that since his recent hip replacement approximately 10 days ago on 12/5, he has been experiencing black stool.  He went to the ED on 12/8 for the symptoms and was treated symptomatically.  Patient seen by GI and underwent EGD that showed severe erosive esophagitis as a source of his melena.  Findings of CT scan of the abdomen checked discussed with patient's family and did not need to follow-up.  Patient cleared by GI for discharge today on PPI therapy and to follow-up as an outpatient.   Consultants: GI Procedures performed: As shown above Disposition: Home Diet recommendation:  Cardiac diet DISCHARGE MEDICATION: Allergies as of 03/10/2023       Reactions   Quinolones Other (See Comments)   Other reaction(s): Has aortic root dilatation   Sulfa Antibiotics Rash         Medication List     STOP taking these medications    clopidogrel 75 MG tablet Commonly known as: PLAVIX       TAKE these medications    aspirin 81 MG chewable tablet Chew 1 tablet (81 mg total) by mouth 2 (two) times daily for 28 days.   iron polysaccharides 150 MG capsule Commonly known as: NIFEREX 1 capsule Orally once a day for 30 days   latanoprost 0.005 % ophthalmic solution Commonly known as: XALATAN Place 1 drop into the right eye at bedtime.   methocarbamol 500 MG tablet Commonly known as: ROBAXIN Take 1 tablet (500 mg total) by mouth every 6 (six) hours as needed for muscle spasms.   metoCLOPramide 5 MG tablet Commonly known as: Reglan Take 1 tablet (5 mg total) by mouth every 6 (six) hours as needed for nausea.   multivitamin with minerals Tabs tablet Take 1 tablet by mouth daily.   nicotine polacrilex 2 MG lozenge Commonly known as: COMMIT Take 1 mg by mouth as needed for smoking cessation.   pantoprazole 40 MG tablet Commonly known as: PROTONIX Take 1 tablet (40 mg total) by mouth 2 (two) times daily. What changed:  how much to take when to take this   polyethylene glycol 17 g packet Commonly known as: MIRALAX / GLYCOLAX Take 17 g by mouth 2 (two) times daily.   senna 8.6 MG Tabs tablet Commonly known as: SENOKOT Take 2 tablets (17.2 mg total) by mouth at bedtime for 14 days.   simvastatin 80 MG tablet Commonly known as: ZOCOR Take 80 mg by mouth at bedtime.  sucralfate 1 g tablet Commonly known as: CARAFATE Take by mouth.   traMADol 50 MG tablet Commonly known as: ULTRAM Take 1-2 tablets (50-100 mg total) by mouth every 6 (six) hours as needed for severe pain (pain score 7-10) or moderate pain (pain score 4-6).        Follow-up Information     Vanga, Loel Dubonnet, MD. Schedule an appointment as soon as possible for a visit.   Specialty: Gastroenterology Contact information: [address] [place]  [address] [phone]                Discharge Exam: Garrett Moore Weights   03/08/23 1135  Weight: 76.7 kg   Eyes:     Conjunctiva/sclera: Conjunctivae normal.     Pupils: Pupils are equal, round, and reactive to light.  Cardiovascular: Systolic murmur heard Pulmonary:     Effort: Pulmonary effort is normal. No respiratory distress.   Abdominal:     General: Bowel sounds are normal. There is no distension.     Palpations: Abdomen is soft.  Musculoskeletal:     Right lower leg: No edema.     Left lower leg: No edema.  Skin:    General: Skin is warm.  Neurological:     Mental Status: He is alert and oriented to person, place, and time. Mental status is at baseline.  Psychiatric:      Condition at discharge: good  The results of significant diagnostics from this hospitalization (including imaging, microbiology, ancillary and laboratory) are listed below for reference.   Imaging Studies: MR LIVER W WO CONTRAST Result Date: 03/10/2023 CLINICAL DATA:  Indeterminate left liver focus on recent CT. EXAM: MRI ABDOMEN WITHOUT AND WITH CONTRAST TECHNIQUE: Multiplanar multisequence MR imaging of the abdomen was performed both before and after the administration of intravenous contrast. CONTRAST:  7mL GADAVIST GADOBUTROL 1 MMOL/ML IV SOLN COMPARISON:  03/08/2023 CT abdomen/pelvis FINDINGS: Lower chest: No acute abnormality at the lung bases. Hepatobiliary: Normal liver size and configuration. No hepatic steatosis. No liver masses. Small subcapsular indistinct foci of focal fat in the left liver anteriorly near the intersegmental fissure in segment 4B on series 4/image 42 and posterior inferiorly in the segment 4B left liver adjacent to the gallbladder fossa on series 4/image 46, which correlates with the area described on the recent CT. Normal gallbladder with no cholelithiasis. No biliary ductal dilatation. Common bile duct diameter 4 mm. No choledocholithiasis. No biliary masses, strictures or  beading. Small to moderate periampullary duodenal diverticulum. Pancreas: No pancreatic mass or duct dilation.  No pancreas divisum. Spleen: Normal size. No mass. Adrenals/Urinary Tract: Normal adrenals. No hydronephrosis. Simple scattered bilateral renal cysts, largest 1.9 cm in the posterior upper left kidney, for which no follow-up imaging is recommended. No suspicious renal masses. Stomach/Bowel: Normal non-distended stomach. Visualized small and large bowel is normal caliber, with no bowel wall thickening. Moderate scattered colonic diverticulosis. Vascular/Lymphatic: Partially visualized infrarenal abdominal aortic aneurysm status post aorto bi-iliac stent graft repair. Patent portal, splenic, hepatic and renal veins. No pathologically enlarged lymph nodes in the abdomen. Other: No abdominal ascites or focal fluid collection. Musculoskeletal: No aggressive appearing focal osseous lesions. IMPRESSION: 1. No liver masses. 2. Small subcapsular indistinct foci of focal fat in the left liver, which accounts for the indeterminate focus described on the recent CT. 3. Moderate colonic diverticulosis. Electronically Signed   By: Delbert Phenix M.D.   On: 03/10/2023 08:18   CT Angio Chest PE W and/or Wo Contrast Result Date: 03/08/2023  CLINICAL DATA:  LLQ abdominal pain; Pulmonary embolism (PE) suspected, high prob. Hypotension. Weakness. Suspected bleeding ulcer. EXAM: CT ANGIOGRAPHY CHEST CT ABDOMEN AND PELVIS WITH CONTRAST TECHNIQUE: Multidetector CT imaging of the chest was performed using the standard protocol during bolus administration of intravenous contrast. Multiplanar CT image reconstructions and MIPs were obtained to evaluate the vascular anatomy. Multidetector CT imaging of the abdomen and pelvis was performed using the standard protocol during bolus administration of intravenous contrast. RADIATION DOSE REDUCTION: This exam was performed according to the departmental dose-optimization program which  includes automated exposure control, adjustment of the mA and/or kV according to patient size and/or use of iterative reconstruction technique. CONTRAST:  OMNIPAQUE IOHEXOL 300 MG/ML SOLN; 50mL OMNIPAQUE IOHEXOL 350 MG/ML SOLN COMPARISON:  CT angiography abdomen and pelvis from 07/05/202320, FINDINGS: CTA CHEST FINDINGS Cardiovascular: No evidence of embolism to the proximal subsegmental pulmonary artery level. Normal cardiac size. No pericardial effusion. No aortic aneurysm. There are coronary artery calcifications, in keeping with coronary artery disease. There are also mild-to-moderate peripheral atherosclerotic vascular calcifications of thoracic aorta and its major branches. Mediastinum/Nodes: Visualized thyroid gland appears grossly unremarkable. No solid / cystic mediastinal masses. The esophagus is nondistended precluding optimal assessment. There is mild circumferential thickening of the lower thoracic esophagus, which is most likely seen in the settings of chronic gastroesophageal reflux disease versus esophagitis. No axillary, mediastinal or hilar lymphadenopathy by size criteria. Lungs/Pleura: The central tracheo-bronchial tree is patent. There are mild-to-moderate upper lobe predominant centrilobular emphysematous changes. There are patchy areas of linear, plate-like atelectasis and/or scarring throughout bilateral lungs. No mass or consolidation. No pleural effusion or pneumothorax. There are multiple lung nodules, described as follows: *There is a lobulated 7 x 11 mm nodule in the left lung lower lobe (series 6, image 67). Please see follow-up recommendations below. *There is a 6 x 6 mm lobulated noncalcified nodule in the right lung lower lobe (series 6, image 94). *There are additional smaller nodules throughout bilateral lungs (marked with electronic arrow sign on series 100). Musculoskeletal: In the right paramedian upper back, there is a 1.7 x 3.1 cm soft tissue structure centered in the  skin/subcutaneous tissue, incompletely characterized on the current exam but favored to represent epidermal inclusion cyst. The visualized soft tissues of the chest wall are otherwise grossly unremarkable. No suspicious osseous lesions. There are mild to moderate multilevel degenerative changes in the visualized spine. Review of the MIP images confirms the above findings. CT ABDOMEN and PELVIS FINDINGS Hepatobiliary: The liver is normal in size. Non-cirrhotic configuration. No suspicious mass. There is an ill-defined, hypoattenuating, approximately 1.2 x 2.3 cm area in the left hepatic lobe, segment 4B, which is incompletely characterized on the current examination. No such area was seen on the prior CT scan; however, this is favored to represent focal fatty infiltration. Further evaluation with multiphasic contrast-enhanced MRI abdomen is recommended. No intrahepatic or extrahepatic bile duct dilation. No calcified gallstones. Normal gallbladder wall thickness. No pericholecystic inflammatory changes. Pancreas: Unremarkable. No pancreatic ductal dilatation or surrounding inflammatory changes. Spleen: Within normal limits. No focal lesion. Adrenals/Urinary Tract: Adrenal glands are unremarkable. No suspicious renal mass. There are at least 2 simple cysts in the left kidney with largest measuring up to 1.6 x 1.8 cm. Focal scarring noted in the left kidney upper pole. No nephroureterolithiasis or obstructive uropathy. Evaluation of the urinary bladder is nondiagnostic due to extensive streak artifacts from bilateral hip arthroplasty hardware. Stomach/Bowel: There is probable small sliding hiatal hernia. There is a diverticulum  arising from the second part of duodenum. There is short segment (approximately 2 cm) of distal descending colon, exhibiting mild irregular wall thickening and pericolonic fat stranding on the background of several diverticula, compatible with acute uncomplicated diverticulitis. No walled off  abscess or loculated collection. No pneumoperitoneum. No disproportionate dilation of small or large bowel loops to suggest bowel obstruction. Unremarkable appendix. Vascular/Lymphatic: No ascites or pneumoperitoneum. No abdominal or pelvic lymphadenopathy, by size criteria. No aneurysmal dilation of the major abdominal arteries. Redemonstration of patient's known aorto bi-iliac stent and aneurysm of native aorta measuring up to 5.3 x 6.5 cm. No significant interval change in size of the aneurysm, when remeasured in similar fashion. No endoleak seen. 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 Reproductive: Prostate is not diagnostically evaluated due to extensive streak artifacts. Other: There is a tiny fat containing left inguinal hernia. The soft tissues and abdominal wall are otherwise unremarkable. Musculoskeletal: No suspicious osseous lesions. There are mild - moderate multilevel degenerative changes in the visualized spine. Note is made of bilateral hip arthroplasty. Review of the MIP images confirms the above findings. IMPRESSION: 1. No embolism to the proximal subsegmental pulmonary artery level. 2. There are several lung nodules with largest 7 x 11 mm solid noncalcified nodule in the left lung lower lobe. Please see follow-up recommendations below. 3. Acute uncomplicated distal descending colon diverticulitis, as described above. 4. There is a 1.2 x 1.3 cm hypoattenuating area in the left hepatic lobe, segment IVb, incompletely characterized on the current exam. Further evaluation with nonemergent multiphasic contrast-enhanced MRI abdomen is recommended. 5. Multiple other nonacute observations, as described above. Aortic aneurysm NOS (ICD10-I71.9). Aortic Atherosclerosis (ICD10-I70.0) and Emphysema (ICD10-J43.9). Pulmonary nodule follow-up recommendation: Solid lung nodule measuring more than 8 mm: - LOW RISK: Consider CT at 3  months, PET / CT or tissue sampling, - HIGHER RISK: : Consider CT at 3 months, PET / CT or tissue sampling. Recommendations for evaluation of incidental nodules derived from guidelines developed by the Fleischner Society ( Radiology 2017; [phone]). These guidelines apply to incidental nodules, which can be managed according to the specific recommendations. These guidelines do not apply to patient's younger than 35 years, immunocompromised patients, or patients with cancer. For lung cancer screening, adherence to the existing [employer] of radiology lung CT Screening Reporting and Data System (lung-RADS) guidelines is recommended. High risk factors include older age, heavy smoking, larger nodule size, irregular or spiculated margins and upper lobe location. Clinical risk factors include smoking, exposure to other carcinogens, emphysema, fibrosis, upper lobe location, family history of lung cancer, age and sex. Electronically Signed   By: Jules Schick M.D.   On: 03/08/2023 14:36   CT ABDOMEN PELVIS W CONTRAST Result Date: 03/08/2023 CLINICAL DATA:  LLQ abdominal pain; Pulmonary embolism (PE) suspected, high prob. Hypotension. Weakness. Suspected bleeding ulcer. EXAM: CT ANGIOGRAPHY CHEST CT ABDOMEN AND PELVIS WITH CONTRAST TECHNIQUE: Multidetector CT imaging of the chest was performed using the standard protocol during bolus administration of intravenous contrast. Multiplanar CT image reconstructions and MIPs were obtained to evaluate the vascular anatomy. Multidetector CT imaging of the abdomen and pelvis was performed using the standard protocol during bolus administration of intravenous contrast. RADIATION DOSE REDUCTION: This exam was performed according to the departmental dose-optimization program which includes automated exposure control, adjustment of the mA and/or kV according to patient size and/or use of iterative reconstruction technique. CONTRAST:  OMNIPAQUE IOHEXOL 300 MG/ML SOLN;  50mL  OMNIPAQUE IOHEXOL 350 MG/ML SOLN COMPARISON:  CT angiography abdomen and pelvis from 07/05/202320, FINDINGS: CTA CHEST FINDINGS Cardiovascular: No evidence of embolism to the proximal subsegmental pulmonary artery level. Normal cardiac size. No pericardial effusion. No aortic aneurysm. There are coronary artery calcifications, in keeping with coronary artery disease. There are also mild-to-moderate peripheral atherosclerotic vascular calcifications of thoracic aorta and its major branches. Mediastinum/Nodes: Visualized thyroid gland appears grossly unremarkable. No solid / cystic mediastinal masses. The esophagus is nondistended precluding optimal assessment. There is mild circumferential thickening of the lower thoracic esophagus, which is most likely seen in the settings of chronic gastroesophageal reflux disease versus esophagitis. No axillary, mediastinal or hilar lymphadenopathy by size criteria. Lungs/Pleura: The central tracheo-bronchial tree is patent. There are mild-to-moderate upper lobe predominant centrilobular emphysematous changes. There are patchy areas of linear, plate-like atelectasis and/or scarring throughout bilateral lungs. No mass or consolidation. No pleural effusion or pneumothorax. There are multiple lung nodules, described as follows: *There is a lobulated 7 x 11 mm nodule in the left lung lower lobe (series 6, image 67). Please see follow-up recommendations below. *There is a 6 x 6 mm lobulated noncalcified nodule in the right lung lower lobe (series 6, image 94). *There are additional smaller nodules throughout bilateral lungs (marked with electronic arrow sign on series 100). Musculoskeletal: In the right paramedian upper back, there is a 1.7 x 3.1 cm soft tissue structure centered in the skin/subcutaneous tissue, incompletely characterized on the current exam but favored to represent epidermal inclusion cyst. The visualized soft tissues of the chest wall are otherwise grossly  unremarkable. No suspicious osseous lesions. There are mild to moderate multilevel degenerative changes in the visualized spine. Review of the MIP images confirms the above findings. CT ABDOMEN and PELVIS FINDINGS Hepatobiliary: The liver is normal in size. Non-cirrhotic configuration. No suspicious mass. There is an ill-defined, hypoattenuating, approximately 1.2 x 2.3 cm area in the left hepatic lobe, segment 4B, which is incompletely characterized on the current examination. No such area was seen on the prior CT scan; however, this is favored to represent focal fatty infiltration. Further evaluation with multiphasic contrast-enhanced MRI abdomen is recommended. No intrahepatic or extrahepatic bile duct dilation. No calcified gallstones. Normal gallbladder wall thickness. No pericholecystic inflammatory changes. Pancreas: Unremarkable. No pancreatic ductal dilatation or surrounding inflammatory changes. Spleen: Within normal limits. No focal lesion. Adrenals/Urinary Tract: Adrenal glands are unremarkable. No suspicious renal mass. There are at least 2 simple cysts in the left kidney with largest measuring up to 1.6 x 1.8 cm. Focal scarring noted in the left kidney upper pole. No nephroureterolithiasis or obstructive uropathy. Evaluation of the urinary bladder is nondiagnostic due to extensive streak artifacts from bilateral hip arthroplasty hardware. Stomach/Bowel: There is probable small sliding hiatal hernia. There is a diverticulum arising from the second part of duodenum. There is short segment (approximately 2 cm) of distal descending colon, exhibiting mild irregular wall thickening and pericolonic fat stranding on the background of several diverticula, compatible with acute uncomplicated diverticulitis. No walled off abscess or loculated collection. No pneumoperitoneum. No disproportionate dilation of small or large bowel loops to suggest bowel obstruction. Unremarkable appendix. Vascular/Lymphatic: No  ascites or pneumoperitoneum. No abdominal or pelvic lymphadenopathy, by size criteria. No aneurysmal dilation of the major abdominal arteries. Redemonstration of patient's known aorto bi-iliac stent and aneurysm of native aorta measuring up to 5.3 x 6.5 cm. No significant interval change in size of the aneurysm, when remeasured in similar fashion. No endoleak seen. Redemonstration of aneurysmal  dilation of bilateral common iliac artery as well, which is also essentially unchanged. Reproductive: Prostate is not diagnostically evaluated due to extensive streak artifacts. Other: There is a tiny fat containing left inguinal hernia. The soft tissues and abdominal wall are otherwise unremarkable. Musculoskeletal: No suspicious osseous lesions. There are mild - moderate multilevel degenerative changes in the visualized spine. Note is made of bilateral hip arthroplasty. Review of the MIP images confirms the above findings. IMPRESSION: 1. No embolism to the proximal subsegmental pulmonary artery level. 2. There are several lung nodules with largest 7 x 11 mm solid noncalcified nodule in the left lung lower lobe. Please see follow-up recommendations below. 3. Acute uncomplicated distal descending colon diverticulitis, as described above. 4. There is a 1.2 x 1.3 cm hypoattenuating area in the left hepatic lobe, segment IVb, incompletely characterized on the current exam. Further evaluation with nonemergent multiphasic contrast-enhanced MRI abdomen is recommended. 5. Multiple other nonacute observations, as described above. Aortic aneurysm NOS (ICD10-I71.9). Aortic Atherosclerosis (ICD10-I70.0) and Emphysema (ICD10-J43.9). Pulmonary nodule follow-up recommendation: Solid lung nodule measuring more than 8 mm: - LOW RISK: Consider CT at 3 months, PET / CT or tissue sampling, - HIGHER RISK: : Consider CT at 3 months, PET / CT or tissue sampling. Recommendations for evaluation of incidental nodules derived from guidelines developed  by the Fleischner Society ( Radiology 2017; [phone]). These guidelines apply to incidental nodules, which can be managed according to the specific recommendations. These guidelines do not apply to patient's younger than 35 years, immunocompromised patients, or patients with cancer. For lung cancer screening, adherence to the existing [employer] of radiology lung CT Screening Reporting and Data System (lung-RADS) guidelines is recommended. High risk factors include older age, heavy smoking, larger nodule size, irregular or spiculated margins and upper lobe location. Clinical risk factors include smoking, exposure to other carcinogens, emphysema, fibrosis, upper lobe location, family history of lung cancer, age and sex. Electronically Signed   By: Jules Schick M.D.   On: 03/08/2023 14:36   DG Pelvis Portable Result Date: 02/25/2023 CLINICAL DATA:  Status post total right hip arthroplasty. EXAM: PORTABLE PELVIS 1-2 VIEWS COMPARISON:  CT abdomen and pelvis 09/24/2021 FINDINGS: Interval total right hip arthroplasty. Redemonstration of total left hip arthroplasty. No perihardware lucency is seen to indicate hardware failure or loosening. The bilateral sacroiliac and the pubic symphysis joint spaces are maintained. No acute fracture or dislocation. Aorto bi-iliac and right internal iliac stent grafts are again noted. Surgical clips overlie the prostate bed. IMPRESSION: Interval total right hip arthroplasty without evidence of hardware failure. Electronically Signed   By: Neita Garnet M.D.   On: 02/25/2023 14:31   DG HIP UNILAT WITH PELVIS 1V RIGHT Result Date: 02/25/2023 CLINICAL DATA:  Intraoperative fluoroscopy for total right hip arthroplasty. EXAM: DG HIP (WITH OR WITHOUT PELVIS) 1V RIGHT COMPARISON:  CT abdomen and pelvis 08/14/2020 FINDINGS: Images were performed intraoperatively without the presence of a radiologist. The patient is undergoing total right hip arthroplasty. Partial visualization of  prior remote total left hip arthroplasty. No hardware complication is seen. Total fluoroscopy images: 2 Total fluoroscopy time: 11 seconds Total dose: Radiation Exposure Index (as provided by the fluoroscopic device): 1.43 mGy air Kerma Please see intraoperative findings for further detail. IMPRESSION: Intraoperative fluoroscopy for total right hip arthroplasty. Electronically Signed   By: Neita Garnet M.D.   On: 02/25/2023 14:28   DG C-Arm 1-60 Min-No Report Result Date: 02/25/2023 Fluoroscopy was utilized by the requesting physician.  No radiographic interpretation.  Microbiology: Results for orders placed or performed during the hospital encounter of 03/08/23  Resp panel by RT-PCR (RSV, Flu A&B, Covid) Anterior Nasal Swab     Status: None   Collection Time: 03/08/23 11:36 AM   Specimen: Anterior Nasal Swab  Result Value Ref Range Status   SARS Coronavirus 2 by RT PCR NEGATIVE NEGATIVE Final    Comment: (NOTE) SARS-CoV-2 target nucleic acids are NOT DETECTED.  The SARS-CoV-2 RNA is generally detectable in upper respiratory specimens during the acute phase of infection. The lowest concentration of SARS-CoV-2 viral copies this assay can detect is 138 copies/mL. A negative result does not preclude SARS-Cov-2 infection and should not be used as the sole basis for treatment or other patient management decisions. A negative result may occur with  improper specimen collection/handling, submission of specimen other than nasopharyngeal swab, presence of viral mutation(s) within the areas targeted by this assay, and inadequate number of viral copies(<138 copies/mL). A negative result must be combined with clinical observations, patient history, and epidemiological information. The expected result is Negative.  Fact Sheet for Patients:  BloggerCourse.com  Fact Sheet for Healthcare Providers:  SeriousBroker.it  This test is no t yet  approved or cleared by the [place] FDA and  has been authorized for detection and/or diagnosis of SARS-CoV-2 by FDA under an Emergency Use Authorization (EUA). This EUA will remain  in effect (meaning this test can be used) for the duration of the COVID-19 declaration under Section 564(b)(1) of the Act, 21 U.S.C.section 360bbb-3(b)(1), unless the authorization is terminated  or revoked sooner.       Influenza A by PCR NEGATIVE NEGATIVE Final   Influenza B by PCR NEGATIVE NEGATIVE Final    Comment: (NOTE) The Xpert Xpress SARS-CoV-2/FLU/RSV plus assay is intended as an aid in the diagnosis of influenza from Nasopharyngeal swab specimens and should not be used as a sole basis for treatment. Nasal washings and aspirates are unacceptable for Xpert Xpress SARS-CoV-2/FLU/RSV testing.  Fact Sheet for Patients: BloggerCourse.com  Fact Sheet for Healthcare Providers: SeriousBroker.it  This test is not yet approved or cleared by the [place] FDA and has been authorized for detection and/or diagnosis of SARS-CoV-2 by FDA under an Emergency Use Authorization (EUA). This EUA will remain in effect (meaning this test can be used) for the duration of the COVID-19 declaration under Section 564(b)(1) of the Act, 21 U.S.C. section 360bbb-3(b)(1), unless the authorization is terminated or revoked.     Resp Syncytial Virus by PCR NEGATIVE NEGATIVE Final    Comment: (NOTE) Fact Sheet for Patients: BloggerCourse.com  Fact Sheet for Healthcare Providers: SeriousBroker.it  This test is not yet approved or cleared by the [place] FDA and has been authorized for detection and/or diagnosis of SARS-CoV-2 by FDA under an Emergency Use Authorization (EUA). This EUA will remain in effect (meaning this test can be used) for the duration of the COVID-19 declaration under Section 564(b)(1) of  the Act, 21 U.S.C. section 360bbb-3(b)(1), unless the authorization is terminated or revoked.  Performed at [facility], [address] [place], [place] [address]   KOH prep     Status: None   Collection Time: 03/09/23 12:42 PM   Specimen: Bronchial Brush  Result Value Ref Range Status   Specimen Description BRONCHIAL BRUSHING  Final   Special Requests NONE  Final   KOH Prep   Final    NO YEAST OR FUNGAL ELEMENTS SEEN Performed at [facility], [address] [place],  [place] [address]    Report Status 03/09/2023 FINAL  Final    Labs: CBC: Recent Labs  Lab 03/08/23 1136 03/08/23 2109 03/09/23 0516 03/09/23 1645 03/10/23 0537  WBC 13.9* 10.6* 9.3 9.5 9.9  NEUTROABS 10.8*  --   --   --  7.2  HGB 8.4* 8.6* 8.6* 9.1* 8.4*  HCT 26.4* 25.4* 26.3* 27.2* 25.1*  MCV 101.1* 94.4 96.7 96.1 94.4  PLT 435* 377 370 390 393   Basic Metabolic Panel: Recent Labs  Lab 03/08/23 1136 03/09/23 0516 03/10/23 0537  NA 133* 135 133*  K 3.2* 3.6 3.2*  CL 101 104 102  CO2 23 24 26   GLUCOSE 119* 97 105*  BUN 16 13 11   CREATININE 1.25* 1.07 1.25*  CALCIUM 7.7* 7.8* 7.8*   Liver Function Tests: Recent Labs  Lab 03/08/23 1136  AST 29  ALT 29  ALKPHOS 80  BILITOT 0.6  PROT 5.8*  ALBUMIN 2.6*   CBG: No results for input(s): "GLUCAP" in the last 168 hours.  Discharge time spent:  .  Signed: Loyce Dys, MD [facility] 03/10/2023   Addendum: Prescription for Augmentin 875mg  twice daily for 5 days was sent in for patient to complete treatment of acute diverticulitis. I updated patient's son Garrett Moore.

## 2023-03-11 NOTE — Anesthesia Postprocedure Evaluation (Signed)
Anesthesia Post Note  Patient: Garrett Moore  Procedure(s) Performed: ESOPHAGOGASTRODUODENOSCOPY (EGD) WITH PROPOFOL ESOPHAGEAL BRUSHING  Patient location during evaluation: Endoscopy Anesthesia Type: General Level of consciousness: awake and alert Pain management: pain level controlled Vital Signs Assessment: post-procedure vital signs reviewed and stable Respiratory status: spontaneous breathing, nonlabored ventilation, respiratory function stable and patient connected to nasal cannula oxygen Cardiovascular status: blood pressure returned to baseline and stable Postop Assessment: no apparent nausea or vomiting Anesthetic complications: no   No notable events documented.   Last Vitals:  Vitals:   03/10/23 0332 03/10/23 0800  BP: (!) 131/57 (!) 124/53  Pulse: 80 74  Resp: 18 18  Temp: 36.7 C 36.6 C  SpO2: 97% 95%    Last Pain:  Vitals:   03/10/23 1000  TempSrc:   PainSc: 0-No pain                 Lenard Simmer

## 2023-03-15 DIAGNOSIS — R410 Disorientation, unspecified: Secondary | ICD-10-CM | POA: Diagnosis not present

## 2023-03-15 DIAGNOSIS — Z09 Encounter for follow-up examination after completed treatment for conditions other than malignant neoplasm: Secondary | ICD-10-CM | POA: Diagnosis not present

## 2023-03-15 DIAGNOSIS — K2210 Ulcer of esophagus without bleeding: Secondary | ICD-10-CM | POA: Diagnosis not present

## 2023-03-15 DIAGNOSIS — R899 Unspecified abnormal finding in specimens from other organs, systems and tissues: Secondary | ICD-10-CM | POA: Diagnosis not present

## 2023-03-15 DIAGNOSIS — D649 Anemia, unspecified: Secondary | ICD-10-CM | POA: Diagnosis not present

## 2023-03-16 LAB — LAB REPORT - SCANNED: EGFR: 57

## 2023-04-01 ENCOUNTER — Emergency Department
Admission: EM | Admit: 2023-04-01 | Discharge: 2023-04-01 | Disposition: A | Discharge status: Home / Self Care | Payer: Medicare HMO | Attending: Emergency Medicine | Admitting: Emergency Medicine

## 2023-04-01 ENCOUNTER — Emergency Department: Payer: Medicare HMO

## 2023-04-01 ENCOUNTER — Other Ambulatory Visit: Payer: Self-pay

## 2023-04-01 DIAGNOSIS — I2510 Atherosclerotic heart disease of native coronary artery without angina pectoris: Secondary | ICD-10-CM | POA: Insufficient documentation

## 2023-04-01 DIAGNOSIS — R42 Dizziness and giddiness: Secondary | ICD-10-CM | POA: Insufficient documentation

## 2023-04-01 DIAGNOSIS — N189 Chronic kidney disease, unspecified: Secondary | ICD-10-CM | POA: Diagnosis not present

## 2023-04-01 DIAGNOSIS — I509 Heart failure, unspecified: Secondary | ICD-10-CM | POA: Diagnosis not present

## 2023-04-01 DIAGNOSIS — R55 Syncope and collapse: Secondary | ICD-10-CM | POA: Diagnosis not present

## 2023-04-01 DIAGNOSIS — R9082 White matter disease, unspecified: Secondary | ICD-10-CM | POA: Diagnosis not present

## 2023-04-01 DIAGNOSIS — J449 Chronic obstructive pulmonary disease, unspecified: Secondary | ICD-10-CM | POA: Insufficient documentation

## 2023-04-01 DIAGNOSIS — M5030 Other cervical disc degeneration, unspecified cervical region: Secondary | ICD-10-CM | POA: Diagnosis not present

## 2023-04-01 LAB — CBC WITH DIFFERENTIAL/PLATELET
Abs Immature Granulocytes: 0.06 10*3/uL (ref 0.00–0.07)
Basophils Absolute: 0 10*3/uL (ref 0.0–0.1)
Basophils Relative: 0 %
Eosinophils Absolute: 0.1 10*3/uL (ref 0.0–0.5)
Eosinophils Relative: 1 %
HCT: 32.2 % — ABNORMAL LOW (ref 39.0–52.0)
Hemoglobin: 10.2 g/dL — ABNORMAL LOW (ref 13.0–17.0)
Immature Granulocytes: 0 %
Lymphocytes Relative: 8 %
Lymphs Abs: 1.1 10*3/uL (ref 0.7–4.0)
MCH: 31 pg (ref 26.0–34.0)
MCHC: 31.7 g/dL (ref 30.0–36.0)
MCV: 97.9 fL (ref 80.0–100.0)
Monocytes Absolute: 1.6 10*3/uL — ABNORMAL HIGH (ref 0.1–1.0)
Monocytes Relative: 12 %
Neutro Abs: 10.9 10*3/uL — ABNORMAL HIGH (ref 1.7–7.7)
Neutrophils Relative %: 79 %
Platelets: 364 10*3/uL (ref 150–400)
RBC: 3.29 MIL/uL — ABNORMAL LOW (ref 4.22–5.81)
RDW: 13.8 % (ref 11.5–15.5)
WBC: 13.7 10*3/uL — ABNORMAL HIGH (ref 4.0–10.5)
nRBC: 0 % (ref 0.0–0.2)

## 2023-04-01 LAB — BASIC METABOLIC PANEL
Anion gap: 13 (ref 5–15)
BUN: 15 mg/dL (ref 8–23)
CO2: 17 mmol/L — ABNORMAL LOW (ref 22–32)
Calcium: 8.3 mg/dL — ABNORMAL LOW (ref 8.9–10.3)
Chloride: 107 mmol/L (ref 98–111)
Creatinine, Ser: 1.37 mg/dL — ABNORMAL HIGH (ref 0.61–1.24)
GFR, Estimated: 52 mL/min — ABNORMAL LOW (ref >60)
Glucose, Bld: 123 mg/dL — ABNORMAL HIGH (ref 70–99)
Potassium: 3.2 mmol/L — ABNORMAL LOW (ref 3.5–5.1)
Sodium: 137 mmol/L (ref 135–145)

## 2023-04-01 LAB — TROPONIN I (HIGH SENSITIVITY)
Troponin I (High Sensitivity): 10 ng/L (ref <18)
Troponin I (High Sensitivity): 16 ng/L (ref <18)

## 2023-04-01 MED ORDER — POTASSIUM CHLORIDE CRYS ER 20 MEQ PO TBCR
40.0000 meq | EXTENDED_RELEASE_TABLET | Freq: Once | ORAL | Status: AC
  Administered 2023-04-01: 40 meq via ORAL
  Filled 2023-04-01: qty 2

## 2023-04-01 NOTE — ED Triage Notes (Signed)
 Pt had syncopal episode in his kitchen this morning, unwitnessed. Pt has hx of CHF, chronic Kidney disease, and liver issues. Pt discontinued his plavix 1 month ago

## 2023-04-01 NOTE — ED Provider Triage Note (Signed)
 Emergency Medicine Provider Triage Evaluation Note  Garrett Moore , a 83 y.o. male  was evaluated in triage.  Pt complains of syncopal episode in kitchen this morning. Wife took his BP after he fell and it was low.   Review of Systems  Positive: Ears ringing Negative: pain  Physical Exam  BP 112/62 (BP Location: Right Arm)   Pulse 88   Temp 98 F (36.7 C) (Oral)   Resp 16   SpO2 100%  Gen:   Awake, no distress   Resp:  Normal effort  MSK:   Moves extremities without difficulty  Other:    Medical Decision Making  Medically screening exam initiated at 1:31 PM.  Appropriate orders placed.  Garrett Moore was informed that the remainder of the evaluation will be completed by another provider, this initial triage assessment does not replace that evaluation, and the importance of remaining in the ED until their evaluation is complete.     Cleaster Tinnie LABOR, PA-C 04/01/23 1333

## 2023-04-01 NOTE — ED Provider Notes (Signed)
 [facility] Provider Note    Event Date/Time   First MD Initiated Contact with Patient 04/01/23 1653     (approximate)   History   Chief Complaint Fall   HPI  Garrett Moore is a 83 y.o. male with past medical history of CAD, CHF, atrial fibrillation, AAA status post repair, COPD, and CKD who presents to the ED complaining of syncope.  Patient reports that he was cooking bacon at home when he suddenly began to feel dizzy and lightheaded.  He states that the next thing he knew he was waking up on the ground, believes he passed out for a minute or 2.  He denies any chest pain or shortness of breath with this episode, is not sure whether he hit his head.  He denies any pain from the fall, states he feels back to normal at the time of my assessment.  He denies ever passing out like this in the past.     Physical Exam   Triage Vital Signs: ED Triage Vitals [04/01/23 1331]  Encounter Vitals Group     BP 112/62     Systolic BP Percentile      Diastolic BP Percentile      Pulse Rate 88     Resp 16     Temp 98 F (36.7 C)     Temp Source Oral     SpO2 100 %     Weight 169 lb (76.7 kg)     Height 5' 11 (1.803 m)     Head Circumference      Peak Flow      Pain Score 0     Pain Loc      Pain Education      Exclude from Growth Chart     Most recent vital signs: Vitals:   04/01/23 1656 04/01/23 1930  BP: (!) 154/60 105/68  Pulse: 84 81  Resp: 18 20  Temp: (!) 97.5 F (36.4 C)   SpO2: 100%     Constitutional: Alert and oriented. Eyes: Conjunctivae are normal. Head: Atraumatic. Nose: No congestion/rhinnorhea. Mouth/Throat: Mucous membranes are moist.  Neck: No midline cervical spine tenderness to palpation. Cardiovascular: Normal rate, regular rhythm. Grossly normal heart sounds.  2+ radial pulses bilaterally. Respiratory: Normal respiratory effort.  No retractions. Lungs CTAB.  No chest wall tenderness to palpation noted. Gastrointestinal:  Soft and nontender. No distention. Musculoskeletal: No lower extremity tenderness nor edema.  No upper extremity bony tenderness to palpation. Neurologic:  Normal speech and language. No gross focal neurologic deficits are appreciated.    ED Results / Procedures / Treatments   Labs (all labs ordered are listed, but only abnormal results are displayed) Labs Reviewed  BASIC METABOLIC PANEL - Abnormal; Notable for the following components:      Result Value   Potassium 3.2 (*)    CO2 17 (*)    Glucose, Bld 123 (*)    Creatinine, Ser 1.37 (*)    Calcium  8.3 (*)    GFR, Estimated 52 (*)    All other components within normal limits  CBC WITH DIFFERENTIAL/PLATELET - Abnormal; Notable for the following components:   WBC 13.7 (*)    RBC 3.29 (*)    Hemoglobin 10.2 (*)    HCT 32.2 (*)    Neutro Abs 10.9 (*)    Monocytes Absolute 1.6 (*)    All other components within normal limits  TROPONIN I (HIGH SENSITIVITY)  TROPONIN I (HIGH SENSITIVITY)  EKG  ED ECG REPORT I, Carlin Palin, the attending physician, personally viewed and interpreted this ECG.   Date: 04/01/2023  EKG Time: 13:43  Rate: 86  Rhythm: normal sinus rhythm  Axis: Normal  Intervals:none  ST&T Change: Inferior T wave inversions  RADIOLOGY CT head reviewed and interpreted by me with no hemorrhage or midline shift.  PROCEDURES:  Critical Care performed: No  Procedures   MEDICATIONS ORDERED IN ED: Medications  potassium chloride  SA (KLOR-CON  M) CR tablet 40 mEq (40 mEq Oral Given 04/01/23 1825)     IMPRESSION / MDM / ASSESSMENT AND PLAN / ED COURSE  I reviewed the triage vital signs and the nursing notes.                              83 y.o. male with past medical history of CAD, CHF, atrial fibrillation, stroke, COPD, AAA status post repair, and CKD who presents to the ED complaining of syncopal episode at home.  Patient's presentation is most consistent with acute presentation with potential  threat to life or bodily function.  Differential diagnosis includes, but is not limited to, arrhythmia, ACS, vasovagal syncope, orthostatic hypotension, anemia, electrolyte abnormality, AKI.  Patient nontoxic-appearing and in no acute distress, vital signs are unremarkable.  EKG shows no evidence of arrhythmia or ischemia, patient denies any concerning symptoms related to his syncopal episode.  CT imaging of his head and cervical spine are negative for acute process, no evidence of traumatic injury to his trunk or extremities. Remainder of labs show renal function comparable to previous with mild hypokalemia, no significant anemia or leukocytosis noted.  Initial troponin within normal limits, we will check second set troponin and observe on cardiac monitor.  Patient was offered admission to the hospital for further syncope workup, but declines currently.  Repeat troponin within normal limits and no events noted on cardiac monitor.  Patient was again offered admission to the hospital, but declines a prefers to follow-up with his cardiologist.  He was counseled to return to the ED for new or worsening symptoms, patient and family agree with plan.      FINAL CLINICAL IMPRESSION(S) / ED DIAGNOSES   Final diagnoses:  Syncope, unspecified syncope type     Rx / DC Orders   ED Discharge Orders          Ordered    Ambulatory referral to Cardiology        04/01/23 2004             Note:  This document was prepared using Dragon voice recognition software and may include unintentional dictation errors.   Palin Carlin, MD 04/01/23 2005

## 2023-04-02 DIAGNOSIS — R55 Syncope and collapse: Secondary | ICD-10-CM | POA: Diagnosis not present

## 2023-04-04 DIAGNOSIS — S93402A Sprain of unspecified ligament of left ankle, initial encounter: Secondary | ICD-10-CM | POA: Diagnosis not present

## 2023-04-08 NOTE — Progress Notes (Signed)
Cardiology Clinic Note   Date: 04/09/2023 ID: Garrett, Moore 28-Aug-1940, MRN [id number]  Primary Cardiologist:  Julien Nordmann, MD  Patient Profile    Garrett Moore is a 83 y.o. male who presents to the clinic today for hospital follow up.     Past medical history significant for: CAD. LHC 08/14/1983: PTCA of totally occluded RCA. Nuclear stress test 01/23/2011: No evidence of ischemia or infarct. Aortic valve stenosis. Echo 07/06/2022: EF 60 to 65%.  No RWMA.  Grade II DD.  Normal RV size/function.  Mild AI.  Moderate to severe aortic valve stenosis, mean gradient 30 mmHg. Limited echo 01/06/2023: EF 60 to 65%.  Aortic valve is normal in structure.  Severe calcification of the aortic valve.  Mild AI.  Moderate to severe aortic valve stenosis, mean gradient 24 mmHg. PSVT. 14-day ZIO 06/18/2022: HR 21 to 182 bpm, average 75 bpm.  1 run of VT lasting 7 beats with max rate 141 bpm.  24 runs of SVT fastest/longest 18 beats with max rate 182 bpm average rate 164 bpm.  Second-degree AV block present.  Rare ectopy. PAD. Abdominal aortogram 10/14/2021: Unable to cross occluded iliac lesion.  Recommend consideration of femoral-femoral bypass. ABI 12/24/2022: Moderate right lower extremity arterial disease, abnormal TBI.  Normal ABI/TBI left lower extremity. Carotid artery stenosis. Carotid duplex 12/24/2022: Right ICA 40 to 59%.  Total occlusion of left ICA. AAA/common iliac artery aneurysm bilaterally. S/p EVAR 07/03/2020. EVAR ultrasound 12/24/2022: Patent endovascular aneurysm repair with no evidence of endoleak.  Unchanged from previous study September 2023. Hyperlipidemia. Lipid panel 05/20/2022: LDL 60, HDL 36, TG 96, total 115. COPD. CKD stage IIIa. Syncope.  In summary, patient was first evaluated by Dr. Mariah Milling on 06/03/2020 prior to AAA repair.  He has a history of angioplasty to totally occluded RCA in 1985.  March 2022 showed normal LV/RV function, Grade I DD, trivial MR, mild AI,  moderate aortic valve stenosis, borderline dilatation of ascending aorta 38 mm.  Underwent EVAR April 2022.  Patient continues to be followed by Dr. Gilda Crease for PAD/carotid disease/AAA.  In February 2024 for acute vision loss and suspect stroke.  MRI without acute findings.  Carotid ultrasound showed chronically occluded left ICA.  He left AMA prior to further testing.  There was mention of PAF and his past medical history however no documented EKG.  14-day ZIO in March 2024 showed runs of VT and SVT but no A-fib as detailed above.  In April 2024 showed normal LV/RV function as detailed above.  Limited echo October 2024 showed moderate to severe aortic stenosis, mean gradient 24 mmHg     History of Present Illness    Garrett Moore is followed by Dr. Mariah Milling for the above outlined history.  Patient was last seen in the office by Dr. Mariah Milling on 02/08/2023 for preop evaluation prior to hip surgery.  He was doing well at that time with no complaints.  Patient with hospital admission 03/08/2023 to 03/10/2023.  He was 10 days post hip replacement.  He was sent to the ED from GI for hypotension with BP 74/40.  Patient reported weakness for 1.5 weeks, black tarry stool and shortness of breath.  Initial labs: Sodium 133, potassium 3.2, creatinine 1.25, BUN 16, WBC 13.9, hemoglobin 8.4, negative respiratory panel.  Troponin 38>> 40. He was transfused 1 unit PRBC. An EGD which showed severe erosive esophagitis as the source of melena.  Was discharged on PPI therapy.  Patient presented to the ED  on 04/01/2023 with complaints of a syncopal event.  He reported being home cooking bacon when he suddenly felt dizzy and lightheaded and the next thing he knew he was waking up on the ground.  He denied chest pain or shortness of breath with episode.  Initial labs: Sodium 137, potassium 3.2, creatinine 1.37, BUN 15, WBC 13.7, hemoglobin 10.2.  Troponin negative x 2.  CT head without acute intracranial pathology.  CT cervical  spine showed severe multilevel disc degeneration and osteophytosis throughout cervical spine.  Patient was discharged follow-up with cardiology as an outpatient.  Today, patient is accompanied by his daughter in law. He denies further syncopal episodes since being seen in the ED last week. He reports prior to the episode he felt his hearing was "amplified really loud" in bilateral ears. He denies chest pain, palpitations, or dyspnea prior to episode. He does not think he passed out entirely as he remembers everything that happened when he was on the floor. Patient states prior to the syncopal episode he had done quite a bit that day performing various household chores/activities for about 4 hours before he started cooking. He did eat and drink like normal that day.  He has had a couple of episodes of amplified hearing and daughter in law reports when he complained about it, it was when he was sitting with his legs down but as soon as he elevated his legs it resolved. Patient denies shortness of breath, dyspnea on exertion, orthopnea or PND. However, daughter in law states she has noticed patient becomes mildly dyspneic with walking and recovers quickly when he rests. He injured his left foot in the fall and has had some edema in that foot that daughter in law noticed extended up his leg yesterday. Patient states he will occasionally get some mild edema. No chest pain, pressure, or tightness. No palpitations.        ROS: All other systems reviewed and are otherwise negative except as noted in History of Present Illness.  EKGs/Labs Reviewed    EKG Interpretation Date/Time:  Friday April 09 2023 14:51:54 EST Ventricular Rate:  79 PR Interval:  180 QRS Duration:  86 QT Interval:  400 QTC Calculation: 458 R Axis:   47  Text Interpretation: Normal sinus rhythm Nonspecific T wave abnormality When compared with ECG of 01-Apr-2023 13:43, Nonspecific T wave abnormality has replaced inverted T waves in  Inferior leads Confirmed by Carlos Levering [phone]) on 04/09/2023 2:54:52 PM   03/08/2023: ALT 29; AST 29 04/01/2023: BUN 15; Creatinine, Ser 1.37; Potassium 3.2; Sodium 137   04/01/2023: Hemoglobin 10.2; WBC 13.7       Physical Exam    VS:  BP (!) 100/50 (BP Location: Left Arm, Patient Position: Sitting, Cuff Size: Normal)   Pulse 79   Ht 5\' 11"  (1.803 m)   Wt 167 lb (75.8 kg)   BMI 23.29 kg/m  , BMI Body mass index is 23.29 kg/m.  Orthostatic VS for the past 24 hrs (Last 3 readings):  BP- Lying Pulse- Lying BP- Sitting Pulse- Sitting BP- Standing at 0 minutes Pulse- Standing at 0 minutes BP- Standing at 3 minutes Pulse- Standing at 3 minutes  04/09/23 1452 120/60 78 120/50 80 112/50 83 120/50 60     GEN: Well nourished, well developed, in no acute distress. Neck: No JVD or carotid bruits. Cardiac:  RRR. No murmurs. No rubs or gallops.   Respiratory:  Respirations regular and unlabored. Clear to auscultation without rales, wheezing or rhonchi. GI:  Soft, nontender, nondistended. Extremities: Radials/DP/PT 2+ and equal bilaterally. No clubbing or cyanosis. Mild edema left foot.   Skin: Warm and dry, no rash. Neuro: Strength intact.  Assessment & Plan   CAD S/p PTCA of totally occluded RCA May 1985.  Nuclear stress test November 2012 with no evidence of ischemia or infarct.  Patient denies chest pain, pressure or tightness. No indication for ischemic evaluation at this time.  -Continue simvastatin.  Syncope Patient seen in ED 04/01/2023 with complaints of syncopal event while cooking in his home.  Workup essentially unremarkable in the ED.  Patient denies chest pain, shortness of breath or palpitations prior to event. He states he had done a lot more activity than usual that morning consisting of 4 hours of various chores/activities around his home. He states he noticed an amplification of his hearing in bilateral ears prior to the event. He has had no further syncopal events but has  had a couple of episodes of the amplified hearing. His daughter in law noticed that when he complained of that he was sitting with his leg down and it resolved as soon as he put his legs up. Orthostatic vitals were negative.  -Order 14 day Zio. -Schedule carotid US.   PSVT 14-day ZIO March 2024 showed 1 run of VT lasting 7 beats, 24 runs of SVT.  Patient denies palpitations.  -14 day Zio as above.   PAD/carotid artery stenosis/AAA S/p EVAR April 2022.  EVAR ultrasound October 2024 showed patent endovascular aneurysm repair with no evidence of endoleak.  Carotid ultrasound October 2024 showed right ICA 40 to 59%, total occlusion of left ICA.  Patient with recent syncopal event. He denies lightheadedness, dizziness, change in vision, presyncope.  -Repeat carotid ultrasound as above.  -Continue to follow with [facility].  Hypokalemia Patient was found to be hypokalemic during ED visit last week.  -Repeat BMP today.   Disposition: BMP today. 14 day Zio, carotid US. Return in 6-8 weeks or sooner as needed.          Signed, Etta Grandchild. Destenie Ingber, DNP, NP-C

## 2023-04-09 ENCOUNTER — Ambulatory Visit: Payer: Medicare HMO

## 2023-04-09 ENCOUNTER — Encounter: Payer: Self-pay | Admitting: Student

## 2023-04-09 ENCOUNTER — Ambulatory Visit: Payer: Medicare HMO | Attending: Student | Admitting: Student

## 2023-04-09 VITALS — BP 100/50 | HR 79 | Ht 71.0 in | Wt 167.0 lb

## 2023-04-09 DIAGNOSIS — I739 Peripheral vascular disease, unspecified: Secondary | ICD-10-CM | POA: Diagnosis not present

## 2023-04-09 DIAGNOSIS — R55 Syncope and collapse: Secondary | ICD-10-CM

## 2023-04-09 DIAGNOSIS — E876 Hypokalemia: Secondary | ICD-10-CM

## 2023-04-09 DIAGNOSIS — I7143 Infrarenal abdominal aortic aneurysm, without rupture: Secondary | ICD-10-CM | POA: Diagnosis not present

## 2023-04-09 DIAGNOSIS — I2510 Atherosclerotic heart disease of native coronary artery without angina pectoris: Secondary | ICD-10-CM | POA: Diagnosis not present

## 2023-04-09 DIAGNOSIS — I6523 Occlusion and stenosis of bilateral carotid arteries: Secondary | ICD-10-CM

## 2023-04-09 DIAGNOSIS — I471 Supraventricular tachycardia, unspecified: Secondary | ICD-10-CM | POA: Diagnosis not present

## 2023-04-09 NOTE — Patient Instructions (Addendum)
Medication Instructions:  Your Physician recommend you continue on your current medication as directed.    *If you need a refill on your cardiac medications before your next appointment, please call your pharmacy*   Lab Work: Your provider would like for you to have following labs drawn today BMP.   If you have labs (blood work) drawn today and your tests are completely normal, you will receive your results only by: MyChart Message (if you have MyChart) OR A paper copy in the mail If you have any lab test that is abnormal or we need to change your treatment, we will call you to review the results.   Testing/Procedures: Christena Deem- Long Term Monitor Instructions  Your physician has requested you wear a ZIO patch monitor for 14 days.  This is a single patch monitor. [employer] supplies one patch monitor per enrollment. Additional stickers are not available. Please do not apply patch if you will be having a Nuclear Stress Test, Echocardiogram, Cardiac CT, MRI, or Chest Xray during the period you would be wearing the monitor. The patch cannot be worn during these tests. You cannot remove and re-apply the ZIO XT patch monitor.  Your ZIO patch monitor will be mailed 3 day USPS to your address on file. It may take 3-5 days to receive your monitor after you have been enrolled.  Once you have received your monitor, please review the enclosed instructions. Your monitor has already been registered assigning a specific monitor serial # to you.  Billing and Patient Assistance Program Information  We have supplied [employer] with any of your insurance information on file for billing purposes. [employer] offers a sliding scale Patient Assistance Program for patients that do not have insurance, or whose insurance does not completely cover the cost of the ZIO monitor.  You must apply for the Patient Assistance Program to qualify for this discounted rate.  To apply, please call [employer] at [phone], select option 4,  select option 2, ask to apply for Patient Assistance Program. Meredeth Ide will ask your household income, and how many people are in your household. They will quote your out-of-pocket cost based on that information. [employer] will also be able to set up a 40-month, interest-free payment plan if needed.  Applying the monitor   Shave hair from upper left chest.  Hold abrader disc by orange tab. Rub abrader in 40 strokes over the upper left chest as indicated in your monitor instructions.  Clean area with 4 enclosed alcohol pads. Let dry.  Apply patch as indicated in monitor instructions. Patch will be placed under collarbone on left side of chest with arrow pointing upward.  Rub patch adhesive wings for 2 minutes. Remove white label marked "1". Remove the white label marked "2". Rub patch adhesive wings for 2 additional minutes.  While looking in a mirror, press and release button in center of patch. A small green light will flash 3-4 times. This will be your only indicator that the monitor has been turned on.  Do not shower for the first 24 hours. You may shower after the first 24 hours.  Press the button if you feel a symptom. You will hear a small click. Record Date, Time and Symptom in the Patient Logbook.  When you are ready to remove the patch, follow instructions on the last 2 pages of Patient Logbook. Stick patch monitor onto the last page of Patient Logbook.  Place Patient Logbook in the blue and white box. Use locking tab on box and  tape box closed securely. The blue and white box has prepaid postage on it. Please place it in the mailbox as soon as possible. Your physician should have your test results approximately 7 days after the monitor has been mailed back to [facility].  Call [facility] Customer Care at [phone] if you have questions regarding your ZIO XT patch monitor. Call them immediately if you see an orange light blinking on your monitor.  If your monitor falls off in less  than 4 days, contact our Monitor department at [phone].  If your monitor becomes loose or falls off after 4 days call [employer] at [phone] for suggestions on securing your monitor.   Your physician has requested that you have a carotid duplex. This test is an ultrasound of the carotid arteries in your neck. It looks at blood flow through these arteries that supply the brain with blood.   Allow one hour for this exam.  There are no restrictions or special instructions.  This will take place at [address] [facility], [place] [address]  Please note: We ask at that you not bring children with you during ultrasound (echo/ vascular) testing. Due to room size and safety concerns, children are not allowed in the ultrasound rooms during exams. Our front office staff cannot provide observation of children in our lobby area while testing is being conducted. An adult accompanying a patient to their appointment will only be allowed in the ultrasound room at the discretion of the ultrasound technician under special circumstances. We apologize for any inconvenience.   Follow-Up: At [facility], you and your health needs are our priority.  As part of our continuing mission to provide you with exceptional heart care, we have created designated Provider Care Teams.  These Care Teams include your primary Cardiologist (physician) and Advanced Practice Providers (APPs -  Physician Assistants and Nurse Practitioners) who all work together to provide you with the care you need, when you need it.    Your next appointment:   6-8 week(s)  Provider:   You may see Julien Nordmann, MD or one of the following Advanced Practice Providers on your designated Care Team:   Carlos Levering, NP

## 2023-04-10 LAB — BASIC METABOLIC PANEL
BUN/Creatinine Ratio: 10 (ref 10–24)
BUN: 12 mg/dL (ref 8–27)
CO2: 24 mmol/L (ref 20–29)
Calcium: 8.3 mg/dL — ABNORMAL LOW (ref 8.6–10.2)
Chloride: 105 mmol/L (ref 96–106)
Creatinine, Ser: 1.26 mg/dL (ref 0.76–1.27)
Glucose: 104 mg/dL — ABNORMAL HIGH (ref 70–99)
Potassium: 3.9 mmol/L (ref 3.5–5.2)
Sodium: 143 mmol/L (ref 134–144)
eGFR: 57 mL/min/{1.73_m2} — ABNORMAL LOW (ref >59)

## 2023-04-19 ENCOUNTER — Encounter (INDEPENDENT_AMBULATORY_CARE_PROVIDER_SITE_OTHER): Payer: Medicare HMO

## 2023-04-19 ENCOUNTER — Ambulatory Visit (INDEPENDENT_AMBULATORY_CARE_PROVIDER_SITE_OTHER): Payer: Medicare HMO | Admitting: Vascular Surgery

## 2023-04-29 ENCOUNTER — Ambulatory Visit: Payer: Medicare HMO | Attending: Student

## 2023-04-29 DIAGNOSIS — D649 Anemia, unspecified: Secondary | ICD-10-CM | POA: Diagnosis not present

## 2023-04-29 DIAGNOSIS — R55 Syncope and collapse: Secondary | ICD-10-CM

## 2023-04-29 DIAGNOSIS — Z79899 Other long term (current) drug therapy: Secondary | ICD-10-CM | POA: Diagnosis not present

## 2023-04-29 DIAGNOSIS — Z5189 Encounter for other specified aftercare: Secondary | ICD-10-CM | POA: Diagnosis not present

## 2023-04-29 DIAGNOSIS — E782 Mixed hyperlipidemia: Secondary | ICD-10-CM | POA: Diagnosis not present

## 2023-05-03 DIAGNOSIS — Z6823 Body mass index (BMI) 23.0-23.9, adult: Secondary | ICD-10-CM | POA: Diagnosis not present

## 2023-05-03 DIAGNOSIS — R413 Other amnesia: Secondary | ICD-10-CM | POA: Diagnosis not present

## 2023-05-03 DIAGNOSIS — J449 Chronic obstructive pulmonary disease, unspecified: Secondary | ICD-10-CM | POA: Diagnosis not present

## 2023-05-03 DIAGNOSIS — N1831 Chronic kidney disease, stage 3a: Secondary | ICD-10-CM | POA: Diagnosis not present

## 2023-05-03 DIAGNOSIS — R55 Syncope and collapse: Secondary | ICD-10-CM | POA: Diagnosis not present

## 2023-05-03 DIAGNOSIS — Z0000 Encounter for general adult medical examination without abnormal findings: Secondary | ICD-10-CM | POA: Diagnosis not present

## 2023-05-03 DIAGNOSIS — D62 Acute posthemorrhagic anemia: Secondary | ICD-10-CM | POA: Diagnosis not present

## 2023-05-03 DIAGNOSIS — E782 Mixed hyperlipidemia: Secondary | ICD-10-CM | POA: Diagnosis not present

## 2023-05-04 ENCOUNTER — Telehealth: Payer: Self-pay | Admitting: Cardiovascular Disease

## 2023-05-04 NOTE — Telephone Encounter (Signed)
Pt requesting cb to discuss confusion in general, forgetting a lot

## 2023-05-04 NOTE — Telephone Encounter (Signed)
Called patient, he states that he has had confusion and states he forgets a lot of things. He seen his primary care provider yesterday (I was not able to see any notes in epic) however, he states they told him to reach out to cardiology to make sure this was not heart related. Patient had carotid US (unchanged from previous) zio was sent in recently but not completed yet. Patient wanted to have Dr.Gollan take a look at his testing. He would like to know who else he can talk to or see. Patient states his pcp completed a urinalysis yesterday (but I did not see any results of this)  Patient requested message be send to [facility] for review.   Thanks!

## 2023-05-04 NOTE — Telephone Encounter (Signed)
Spoke with son per DPR. Notified son of the following from Dr. Mariah Milling.  I have reviewed the records  Carotid ultrasound unchanged over the past 3 years  I do not see anything that would contribute to the symptoms he describes  Would suggest he talk with primary care and ask for referral to neurology  Thx  TGollan   Son verbalized understanding.

## 2023-05-05 DIAGNOSIS — R55 Syncope and collapse: Secondary | ICD-10-CM | POA: Diagnosis not present

## 2023-05-10 ENCOUNTER — Telehealth: Payer: Self-pay | Admitting: Psychiatry

## 2023-05-10 NOTE — Telephone Encounter (Signed)
We are unable to see him sooner due to our limited availability. Please advise him to go to [facility] or the emergency room immediately, or call 911 or the crisis helpline ([phone]), as it is difficult to determine the severity of his condition.

## 2023-05-11 ENCOUNTER — Telehealth: Payer: Self-pay

## 2023-05-11 NOTE — Telephone Encounter (Signed)
-----   Message from [facility] Hedrick H sent at 03/10/2023  1:05 PM EST ----- Repeat EGD in 3 months to check for healing

## 2023-05-11 NOTE — Telephone Encounter (Signed)
Patient states it is all healed up after the EGD. Informed him the only way we would know is to do a repeat EGD. Informed him it would be out patient. He states he is not interested and he will not schedule at this time

## 2023-05-14 DIAGNOSIS — L0291 Cutaneous abscess, unspecified: Secondary | ICD-10-CM | POA: Diagnosis not present

## 2023-05-16 DIAGNOSIS — L0291 Cutaneous abscess, unspecified: Secondary | ICD-10-CM | POA: Diagnosis not present

## 2023-05-17 ENCOUNTER — Other Ambulatory Visit: Payer: Self-pay

## 2023-05-17 DIAGNOSIS — K2100 Gastro-esophageal reflux disease with esophagitis, without bleeding: Secondary | ICD-10-CM

## 2023-05-17 NOTE — Telephone Encounter (Signed)
 The patient called and left a voicemail requesting to reschedule his EGD. I called him back to let him know that we received his message and sent the message to the nurse.

## 2023-05-17 NOTE — Telephone Encounter (Signed)
 Return patient call and he states he will do the EGD if he does not have to be admitted to the hospital. Informed him all our procedures are out patient unless he has complications to the procedure. He states okay he will schedule. He did not want to go to [facility] and declined 06/10/2023. Schedule patient for 07/07/2023. Went over instructions, mailed them and sent to [employer]

## 2023-05-18 NOTE — Telephone Encounter (Signed)
 The patient called in and left a message about not getting his instructions. I called him to let him know we received his message and I let him know I mailed out a new letter but both will take a few days before he receives it.

## 2023-05-20 DIAGNOSIS — L720 Epidermal cyst: Secondary | ICD-10-CM | POA: Diagnosis not present

## 2023-05-22 DIAGNOSIS — R55 Syncope and collapse: Secondary | ICD-10-CM

## 2023-05-23 NOTE — Progress Notes (Unsigned)
 Cardiology Clinic Note   Date: 05/25/2023 ID: Ebony, Rickel [birth year], MRN [id number]  Primary Cardiologist:  Julien Nordmann, MD  Chief Complaint   Garrett Moore is a 83 y.o. male who presents to the clinic today for follow up after testing.   Patient Profile   Garrett Moore is followed by Dr. Mariah Milling for the history outlined below.      Past medical history significant for: CAD. LHC 08/14/1983: PTCA of totally occluded RCA. Nuclear stress test 01/23/2011: No evidence of ischemia or infarct. Aortic valve stenosis. Echo 07/06/2022: EF 60 to 65%.  No RWMA.  Grade II DD.  Normal RV size/function.  Mild AI.  Moderate to severe aortic valve stenosis, mean gradient 30 mmHg. Limited echo 01/06/2023: EF 60 to 65%.  Aortic valve is normal in structure.  Severe calcification of the aortic valve.  Mild AI.  Moderate to severe aortic valve stenosis, mean gradient 24 mmHg. PSVT. 14-day ZIO 04/09/2023: HR 29 to 194 bpm, average 80 bpm.  51 runs of SVT fastest lasting 5 beats max rate 194 bpm, longest 12 beats average rate 106 bpm.  Idioventricular rhythm present, second-degree AV block-Mobitz 1 present.  Rare ectopy. PAD. Abdominal aortogram 10/14/2021: Unable to cross occluded iliac lesion.  Recommend consideration of femoral-femoral bypass. ABI 12/24/2022: Moderate right lower extremity arterial disease, abnormal TBI.  Normal ABI/TBI left lower extremity. Carotid artery stenosis. Carotid duplex 04/29/2023: Right ICA 40 to 59%.  Total occlusion of left ICA.  Unchanged from prior ultrasound October 2024. AAA/common iliac artery aneurysm bilaterally. S/p EVAR 07/03/2020. EVAR ultrasound 12/24/2022: Patent endovascular aneurysm repair with no evidence of endoleak.  Unchanged from previous study September 2023. Hyperlipidemia. Lipid panel 05/20/2022: LDL 60, HDL 36, TG 96, total 115. COPD. CKD stage IIIa. Syncope.  In summary, patient was first evaluated by Dr. Mariah Milling on 06/03/2020 prior to AAA  repair.  He has a history of angioplasty to totally occluded RCA in 1985.  March 2022 showed normal LV/RV function, Grade I DD, trivial MR, mild AI, moderate aortic valve stenosis, borderline dilatation of ascending aorta 38 mm.  Underwent EVAR April 2022.  Patient continues to be followed by Dr. Gilda Crease for PAD/carotid disease/AAA.  In February 2024 for acute vision loss and suspect stroke.  MRI without acute findings.  Carotid ultrasound showed chronically occluded left ICA.  He left AMA prior to further testing.  There was mention of PAF and his past medical history however no documented EKG.  14-day ZIO in March 2024 showed runs of VT and SVT but no A-fib as detailed above.  In April 2024 showed normal LV/RV function as detailed above.  Limited echo October 2024 showed moderate to severe aortic stenosis, mean gradient 24 mmHg   Patient with hospital admission 03/08/2023 to 03/10/2023.  He was 10 days post hip replacement.  He was sent to the ED from GI for hypotension with BP 74/40.  Patient reported weakness for 1.5 weeks, black tarry stool and shortness of breath.  Initial labs: Sodium 133, potassium 3.2, creatinine 1.25, BUN 16, WBC 13.9, hemoglobin 8.4, negative respiratory panel.  Troponin 38>> 40. He was transfused 1 unit PRBC. An EGD which showed severe erosive esophagitis as the source of melena.  Was discharged on PPI therapy.   Patient presented to the ED on 04/01/2023 with complaints of a syncopal event.  He reported being home cooking bacon when he suddenly felt dizzy and lightheaded and the next thing he knew he was waking up  on the ground.  He denied chest pain or shortness of breath with episode.  Initial labs: Sodium 137, potassium 3.2, creatinine 1.37, BUN 15, WBC 13.7, hemoglobin 10.2.  Troponin negative x 2.  CT head without acute intracranial pathology.  CT cervical spine showed severe multilevel disc degeneration and osteophytosis throughout cervical spine.  Patient was discharged  follow-up with cardiology as an outpatient.  Patient was last seen in the office by me on 04/09/2023 for hospital follow-up.  He denied further syncopal episodes at that time.  He reported prior to syncopal episode he felt like his hearing was "amplified" in bilateral ears.  He denied chest pain, palpitations or dyspnea prior to episode.  He does not think he lost consciousness as he remembered everything that happened while he was on the floor.  He stated prior to syncopal episode he had done quite a bit of household chores/activities for about 4 hours.  Patient's daughter-in-law reported he had complained of amplified hearing when sitting with his legs in a dependent position but as soon as he elevated his legs it resolved.  Patient denies dyspnea however daughter-in-law reported she noticed mild dyspnea with walking with quick recovery at rest.  Carotid ultrasound was ordered and showed stable disease as detailed above.  14-day via showed runs of SVT at second-degree AV block as detailed above.     History of Present Illness    Today, patient is accompanied by his son. He is doing well. Patient denies shortness of breath, dyspnea on exertion, lower extremity edema, orthopnea or PND. No chest pain, pressure, or tightness. No palpitations.  He has had no further syncopal episodes. He states the sensation of amplified hearing has resolved. He stopped several of his medications since his hip replacement in December. He is now taking daily baby aspirin instead of Plavix monotherapy. He stopped iron and melatonin as well. He feels these medications may have contributed to his symptoms. He is independent and active at home. He is able to complete all activities with no limitations. Discussed results of heart monitor in detail. All questions answered.      ROS: All other systems reviewed and are otherwise negative except as noted in History of Present Illness.  EKGs/Labs Reviewed    EKG  Interpretation Date/Time:  Tuesday May 25 2023 13:31:36 EST Ventricular Rate:  78 PR Interval:  190 QRS Duration:  80 QT Interval:  388 QTC Calculation: 442 R Axis:   46  Text Interpretation: Normal sinus rhythm Normal ECG When compared with ECG of 09-Apr-2023 14:51, No significant change was found Confirmed by Carlos Levering [phone]) on 05/25/2023 1:34:14 PM   03/08/2023: ALT 29; AST 29 04/09/2023: BUN 12; Creatinine, Ser 1.26; Potassium 3.9; Sodium 143   04/01/2023: Hemoglobin 10.2; WBC 13.7    Physical Exam    VS:  BP (!) 120/50 (BP Location: Left Arm, Patient Position: Sitting, Cuff Size: Normal)   Pulse 78   Ht 5\' 11"  (1.803 m)   Wt 164 lb (74.4 kg)   SpO2 97%   BMI 22.87 kg/m  , BMI Body mass index is 22.87 kg/m.  GEN: Well nourished, well developed, in no acute distress. Neck: No JVD or carotid bruits. Cardiac:  RRR. No murmurs. No rubs or gallops.   Respiratory:  Respirations regular and unlabored. Clear to auscultation without rales, wheezing or rhonchi. GI: Soft, nontender, nondistended. Extremities: Radials/DP/PT 2+ and equal bilaterally. No clubbing or cyanosis. No edema.  Skin: Warm and dry, no rash. Neuro: Strength  intact.  Assessment & Plan   CAD S/p PTCA of totally occluded RCA May 1985.  Nuclear stress test November 2012 with no evidence of ischemia or infarct.  Patient denies chest pain, pressure or tightness. No indication for ischemic evaluation at this time.  -Continue aspirin, simvastatin.   Syncope Patient seen in ED 04/01/2023 with complaints of syncopal event while cooking in his home.  Workup essentially unremarkable in the ED.  Patient denies chest pain, shortness of breath or palpitations prior to event. He states he had done a lot more activity than usual that morning consisting of 4 hours of various chores/activities around his home. He states he noticed an amplification of his hearing in bilateral ears prior to the event. He has had no further  syncopal events but has had a couple of episodes of the amplified hearing. His daughter in law noticed that when he complained of that he was sitting with his leg down and it resolved as soon as he put his legs up.  14-day ZIO showed runs of SVT second-degree AV block (detailed below).  Carotid ultrasound showed stable carotid disease (detailed below). Patient reports no further episodes of syncope or sensation of amplified hearing. He stopped several medications including Plavix (now taking aspirin), iron, and melatonin.  -No further workup indicated at this time.   PSVT 14-day ZIO January 2025 showed HR 29 to 194 bpm, average 80 bpm, 51 runs of SVT fastest lasting 5 beats max rate 194 bpm, longest 12 beats average rate 106 bpm, idioventricular rhythm present, second-degree AV block-Mobitz 1 present, rare ectopy.  Patient denies palpitations. RRR on exam today. EKG demonstrates NSR.  -Contact the office for palpitations.    PAD/carotid artery stenosis/AAA S/p EVAR April 2022.  EVAR ultrasound October 2024 showed patent endovascular aneurysm repair with no evidence of endoleak.  Carotid ultrasound February 2025 showed right ICA 40 to 59%, total occlusion of left ICA unchanged from October 2024.  Patient reports sensation of amplified hearing has resolved. He denies lightheadedness, dizziness, presyncope or syncope.  -Continue to follow with [facility].  Disposition: Return in November after previously scheduled echo.          Signed, Etta Grandchild. Decker Cogdell, DNP, NP-C

## 2023-05-25 ENCOUNTER — Ambulatory Visit: Payer: Medicare HMO | Attending: Student | Admitting: Student

## 2023-05-25 ENCOUNTER — Encounter: Payer: Self-pay | Admitting: Student

## 2023-05-25 VITALS — BP 120/50 | HR 78 | Ht 71.0 in | Wt 164.0 lb

## 2023-05-25 DIAGNOSIS — I6523 Occlusion and stenosis of bilateral carotid arteries: Secondary | ICD-10-CM | POA: Diagnosis not present

## 2023-05-25 DIAGNOSIS — I714 Abdominal aortic aneurysm, without rupture, unspecified: Secondary | ICD-10-CM

## 2023-05-25 DIAGNOSIS — Z79899 Other long term (current) drug therapy: Secondary | ICD-10-CM | POA: Diagnosis not present

## 2023-05-25 DIAGNOSIS — I2510 Atherosclerotic heart disease of native coronary artery without angina pectoris: Secondary | ICD-10-CM

## 2023-05-25 DIAGNOSIS — I471 Supraventricular tachycardia, unspecified: Secondary | ICD-10-CM

## 2023-05-25 NOTE — Patient Instructions (Signed)
 Medication Instructions:  Your Physician recommend you continue on your current medication as directed.    *If you need a refill on your cardiac medications before your next appointment, please call your pharmacy*   Lab Work: Your provider would like for you to have following labs drawn today CBC and BMeT.   If you have labs (blood work) drawn today and your tests are completely normal, you will receive your results only by: MyChart Message (if you have MyChart) OR A paper copy in the mail If you have any lab test that is abnormal or we need to change your treatment, we will call you to review the results.   Follow-Up: At [facility], you and your health needs are our priority.  As part of our continuing mission to provide you with exceptional heart care, we have created designated Provider Care Teams.  These Care Teams include your primary Cardiologist (physician) and Advanced Practice Providers (APPs -  Physician Assistants and Nurse Practitioners) who all work together to provide you with the care you need, when you need it.   Your next appointment:   After your echocardiogram on 02/08/2024  Provider:   Julien Nordmann, MD

## 2023-05-26 LAB — BASIC METABOLIC PANEL
BUN/Creatinine Ratio: 9 — ABNORMAL LOW (ref 10–24)
BUN: 16 mg/dL (ref 8–27)
CO2: 24 mmol/L (ref 20–29)
Calcium: 8.5 mg/dL — ABNORMAL LOW (ref 8.6–10.2)
Chloride: 105 mmol/L (ref 96–106)
Creatinine, Ser: 1.81 mg/dL — ABNORMAL HIGH (ref 0.76–1.27)
Glucose: 107 mg/dL — ABNORMAL HIGH (ref 70–99)
Potassium: 4.6 mmol/L (ref 3.5–5.2)
Sodium: 140 mmol/L (ref 134–144)
eGFR: 37 mL/min/{1.73_m2} — ABNORMAL LOW (ref >59)

## 2023-05-26 LAB — CBC
Hematocrit: 36 % — ABNORMAL LOW (ref 37.5–51.0)
Hemoglobin: 11.4 g/dL — ABNORMAL LOW (ref 13.0–17.7)
MCH: 29.2 pg (ref 26.6–33.0)
MCHC: 31.7 g/dL (ref 31.5–35.7)
MCV: 92 fL (ref 79–97)
Platelets: 407 10*3/uL (ref 150–450)
RBC: 3.9 x10E6/uL — ABNORMAL LOW (ref 4.14–5.80)
RDW: 12.8 % (ref 11.6–15.4)
WBC: 8.4 10*3/uL (ref 3.4–10.8)

## 2023-05-27 ENCOUNTER — Telehealth: Payer: Self-pay

## 2023-05-27 NOTE — Telephone Encounter (Signed)
 The patient called in to cancel his procedure.

## 2023-05-27 NOTE — Telephone Encounter (Signed)
 Patient called in to cancel his EGD with Dr. Allegra Lai on 07/07/2023. He states he does not want to be put to sleep again. Called ENDO and talk to Dava and she will get patient cancel

## 2023-05-28 ENCOUNTER — Other Ambulatory Visit: Payer: Self-pay

## 2023-05-28 ENCOUNTER — Telehealth (INDEPENDENT_AMBULATORY_CARE_PROVIDER_SITE_OTHER): Payer: Self-pay

## 2023-05-28 ENCOUNTER — Other Ambulatory Visit: Payer: Self-pay | Admitting: Emergency Medicine

## 2023-05-28 DIAGNOSIS — Z79899 Other long term (current) drug therapy: Secondary | ICD-10-CM

## 2023-05-28 DIAGNOSIS — I480 Paroxysmal atrial fibrillation: Secondary | ICD-10-CM

## 2023-05-28 NOTE — Telephone Encounter (Signed)
 Patient left a VM to cancel all appts including [place] for 07/05/23. Unsure he if would like to reschedule or not. He didn't say.   Please advise

## 2023-06-14 DIAGNOSIS — G459 Transient cerebral ischemic attack, unspecified: Secondary | ICD-10-CM | POA: Diagnosis not present

## 2023-06-14 DIAGNOSIS — D23111 Other benign neoplasm of skin of right upper eyelid, including canthus: Secondary | ICD-10-CM | POA: Diagnosis not present

## 2023-06-14 DIAGNOSIS — H40012 Open angle with borderline findings, low risk, left eye: Secondary | ICD-10-CM | POA: Diagnosis not present

## 2023-06-14 DIAGNOSIS — H401113 Primary open-angle glaucoma, right eye, severe stage: Secondary | ICD-10-CM | POA: Diagnosis not present

## 2023-06-21 DIAGNOSIS — Z6823 Body mass index (BMI) 23.0-23.9, adult: Secondary | ICD-10-CM | POA: Diagnosis not present

## 2023-06-21 DIAGNOSIS — Z09 Encounter for follow-up examination after completed treatment for conditions other than malignant neoplasm: Secondary | ICD-10-CM | POA: Diagnosis not present

## 2023-06-21 DIAGNOSIS — Z515 Encounter for palliative care: Secondary | ICD-10-CM | POA: Diagnosis not present

## 2023-06-25 ENCOUNTER — Telehealth: Payer: Self-pay | Admitting: Cardiovascular Disease

## 2023-06-25 NOTE — Telephone Encounter (Signed)
 Patient is requesting to speak with Dr. Mariah Milling. When asking what it was in regard to, the patient stated he is having some problems and would like to go under hospice care. Please advise.

## 2023-06-25 NOTE — Telephone Encounter (Signed)
 Spoke with patient at length. He discussed surgery, PCP discussion, and wants to discuss Hospice care. He reports that his PCP said he does not have any problems at this time which would warrant ordering hospice. He did tell patient that he could stop his cardiac medication and that would probably hasten his time. The only cardiac medications that I see are his cholesterol meds. Patient expressed his health, and his drivers license taken away. He reports that a couple of reasons why DMV denied were heart problems which he states that he does not have any, they also denied because of his eyesight and he recently had his eyes checked and they were ok as well. So he wanted to discuss hospice with Dr. Mariah Milling because he has lost his independence and these are reasons that he wants hospice.  Advised that we do not order hospice orders and his PCP will need to do that but he reports they denied his request. So his new PCP see him until May. Advised that I would send this over to Dr. Mariah Milling to see if he should have any recommendations then I will call him back. Scheduled him appointment to come see [place] in May as well. He verbalized understanding of our conversation with no further needs at this time. Advised that I would call him back.

## 2023-06-28 NOTE — Telephone Encounter (Signed)
 Reviewed Dr. Windell Hummingbird information and he verbalized understanding with no further questions for now.      Antonieta Iba, MD  Bryna Colander, RN; Jani Gravel, RN  I can talk about his issues on his visit in May Based on my prior visit in November 2024 I do not think he would qualify for hospice.  Typically they would evaluate him and determine if his life expectancy is expected to be less than 1 year, there are certain criteria they look for such as terminal illness Thx TGollan

## 2023-07-05 ENCOUNTER — Encounter (INDEPENDENT_AMBULATORY_CARE_PROVIDER_SITE_OTHER): Payer: Medicare HMO

## 2023-07-05 ENCOUNTER — Ambulatory Visit (INDEPENDENT_AMBULATORY_CARE_PROVIDER_SITE_OTHER): Payer: Medicare HMO | Admitting: Vascular Surgery

## 2023-07-05 DIAGNOSIS — Z79899 Other long term (current) drug therapy: Secondary | ICD-10-CM | POA: Diagnosis not present

## 2023-07-06 LAB — BASIC METABOLIC PANEL WITH GFR
BUN/Creatinine Ratio: 9 — ABNORMAL LOW (ref 10–24)
BUN: 11 mg/dL (ref 8–27)
CO2: 22 mmol/L (ref 20–29)
Calcium: 8.3 mg/dL — ABNORMAL LOW (ref 8.6–10.2)
Chloride: 106 mmol/L (ref 96–106)
Creatinine, Ser: 1.24 mg/dL (ref 0.76–1.27)
Glucose: 93 mg/dL (ref 70–99)
Potassium: 4.1 mmol/L (ref 3.5–5.2)
Sodium: 141 mmol/L (ref 134–144)
eGFR: 58 mL/min/{1.73_m2} — ABNORMAL LOW (ref >59)

## 2023-07-07 ENCOUNTER — Ambulatory Visit: Admit: 2023-07-07 | Payer: Medicare HMO | Admitting: Gastroenterology

## 2023-07-07 SURGERY — ESOPHAGOGASTRODUODENOSCOPY (EGD) WITH PROPOFOL
Anesthesia: General

## 2023-07-21 ENCOUNTER — Ambulatory Visit: Payer: Medicare HMO | Admitting: Neurology

## 2023-07-26 ENCOUNTER — Ambulatory Visit: Payer: Medicare HMO | Admitting: Family Medicine

## 2023-07-27 NOTE — Progress Notes (Unsigned)
     Garrett Moore T. [name], MD, CAQ Sports Medicine [facility] [address] [place], [address]  Phone: [phone]  FAX: [phone]  Garrett Moore - 83 y.o. male  MRN [id number]  Date of Birth: 23-Dec-1940  Date: 07/28/2023  PCP: Garrett Mould, MD  Referral: Garrett Mould, MD  No chief complaint on file.  Subjective:   Garrett Moore is a 83 y.o. very pleasant male patient with There is no height or weight on file to calculate BMI. who presents with the following:  This is an extremely complicated 83 year old patient who is a transfer of care from Garrett Moore from [place].  He is currently on palliative care.  He does have coronary disease and has had previous PCI.  He does take aspirin  81 mg.  Zocor 80 mg.  He also has a history of chronic diastolic heart failure, and he is on neither an ACE or ARB.  He does see Garrett Moore as his primary cardiologist.  History of stroke, he is currently on aspirin  as well as Zocor 80 mg.  History of COPD, not currently on any inhalers.  History of paroxysmal atrial fibrillation, he is not currently anticoagulated.  Does take aspirin  81 mg.  He has had discussions regarding hospice with his primary care doctor as well as Garrett Moore.  He also has chronic kidney disease stage III.  He did have recent conversation about stopping all of his cardiac medications with Garrett Moore.  We could review this, as well.    Review of Systems is noted in the HPI, as appropriate  Objective:   There were no vitals taken for this visit.  GEN: No acute distress; alert,appropriate. PULM: Breathing comfortably in no respiratory distress PSYCH: Normally interactive.   Laboratory and Imaging Data:  Assessment and Plan:   ***

## 2023-07-28 ENCOUNTER — Encounter: Payer: Self-pay | Admitting: Family Medicine

## 2023-07-28 ENCOUNTER — Ambulatory Visit (INDEPENDENT_AMBULATORY_CARE_PROVIDER_SITE_OTHER): Admitting: Family Medicine

## 2023-07-28 VITALS — BP 130/60 | HR 75 | Temp 98.4°F | Ht 70.25 in | Wt 166.5 lb

## 2023-07-28 DIAGNOSIS — N1831 Chronic kidney disease, stage 3a: Secondary | ICD-10-CM

## 2023-07-28 DIAGNOSIS — I2510 Atherosclerotic heart disease of native coronary artery without angina pectoris: Secondary | ICD-10-CM | POA: Diagnosis not present

## 2023-07-28 DIAGNOSIS — F331 Major depressive disorder, recurrent, moderate: Secondary | ICD-10-CM

## 2023-07-28 DIAGNOSIS — I5032 Chronic diastolic (congestive) heart failure: Secondary | ICD-10-CM

## 2023-07-28 DIAGNOSIS — I714 Abdominal aortic aneurysm, without rupture, unspecified: Secondary | ICD-10-CM

## 2023-07-28 DIAGNOSIS — I70213 Atherosclerosis of native arteries of extremities with intermittent claudication, bilateral legs: Secondary | ICD-10-CM | POA: Diagnosis not present

## 2023-07-28 DIAGNOSIS — E782 Mixed hyperlipidemia: Secondary | ICD-10-CM

## 2023-07-28 DIAGNOSIS — J431 Panlobular emphysema: Secondary | ICD-10-CM

## 2023-07-28 DIAGNOSIS — I350 Nonrheumatic aortic (valve) stenosis: Secondary | ICD-10-CM

## 2023-07-28 DIAGNOSIS — I480 Paroxysmal atrial fibrillation: Secondary | ICD-10-CM

## 2023-07-28 DIAGNOSIS — I252 Old myocardial infarction: Secondary | ICD-10-CM

## 2023-07-28 DIAGNOSIS — Z8673 Personal history of transient ischemic attack (TIA), and cerebral infarction without residual deficits: Secondary | ICD-10-CM | POA: Diagnosis not present

## 2023-07-28 DIAGNOSIS — Z9861 Coronary angioplasty status: Secondary | ICD-10-CM

## 2023-07-28 DIAGNOSIS — Z7189 Other specified counseling: Secondary | ICD-10-CM

## 2023-07-28 MED ORDER — MIRTAZAPINE 15 MG PO TABS: 15.0000 mg | ORAL_TABLET | Freq: Every day | ORAL | 2 refills | Status: DC

## 2023-07-29 ENCOUNTER — Encounter: Payer: Self-pay | Admitting: Family Medicine

## 2023-07-29 DIAGNOSIS — Z7189 Other specified counseling: Secondary | ICD-10-CM | POA: Insufficient documentation

## 2023-07-29 DIAGNOSIS — I252 Old myocardial infarction: Secondary | ICD-10-CM | POA: Insufficient documentation

## 2023-08-10 ENCOUNTER — Ambulatory Visit (INDEPENDENT_AMBULATORY_CARE_PROVIDER_SITE_OTHER): Admitting: Clinical

## 2023-08-10 ENCOUNTER — Encounter (INDEPENDENT_AMBULATORY_CARE_PROVIDER_SITE_OTHER): Payer: Self-pay

## 2023-08-10 DIAGNOSIS — F4321 Adjustment disorder with depressed mood: Secondary | ICD-10-CM

## 2023-08-10 NOTE — Progress Notes (Signed)
   Doree Barthel, LCSW
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
## 2023-08-11 ENCOUNTER — Telehealth: Payer: Self-pay | Admitting: Family Medicine

## 2023-08-11 NOTE — Progress Notes (Signed)
 Cardiology Office Note  Date:  08/12/2023   ID:  Garrett Moore, Garrett Moore 14-Sep-1940, MRN [id number]  PCP:  Scherrie Curt, MD   Chief Complaint  Patient presents with   Follow-up    Patient c/o shortness of breath with walking a short distance and has some visual changes that are concerning; denies syncope.      HPI:  Garrett Moore is a 83 year-old gentleman with past medical history of Peripheral arterial disease, AAA, endograft repair, no endoleak ct scan 12/24 CAD, PTCA of RCA (sx of fatigue) 1985 Aortic valve stenosis, moderate Total occlusion left carotid, mild to moderate disease on right Presents for f/u of his PAD, AAA repair,March 2022, CAD, moderate AS  Last seen by myself in clinic 11/24 Presents today with his daughter-in-law Does not drive Wife on hospice Uses a scooter Family will drive him to get around  Reports that he was told he was too healthy for hospice He reports that he does not want any significant testing or does not want to go to the hospital, does not want any interventions  Reports that he stopped all of his medications Quit aspirin  and plavix  Not on statin  Fall in JAN 2025, was busy all morning, cooking bacon Ended up on the floor, hearing problem since then  Upper GI bleed dec 2024 EGD that showed severe erosive esophagitis as a source of his melena.   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  Echocardiogram January 06, 2023 Normal ejection fraction 60% Moderate aortic valve stenosis, numbers appear relatively unchanged compared to prior echocardiogram April 2024 and March 2022  Denies chest pain on exertion He does develop shortness of breath on walking  EKG personally reviewed by myself on todays visit EKG Interpretation Date/Time:  Thursday Aug 12 2023 09:16:30 EDT Ventricular Rate:  72 PR  Interval:  194 QRS Duration:  90 QT Interval:  406 QTC Calculation: 444 R Axis:   44  Text Interpretation: Normal sinus rhythm Normal ECG When compared with ECG of 25-May-2023 13:31, No significant change was found Confirmed by Belva Boyden [phone]) on 08/12/2023 9:29:27 AM     Past medical history reviewed catheterization 1985  Angioplasty to RCA  Previously seen by cardiology in 2014 Echocardiogram at that time with mild aortic valve stenosis at that time, mild dilation of aortic root and ascending aorta   PMH:   has a past medical history of Aneurysm of infrarenal abdominal aorta (HCC) (02/08/2018), Aortic atherosclerosis (HCC), Aortic valvar stenosis (07/21/2012), Atherosclerosis of native arteries of extremity with intermittent claudication (HCC), Atrial fibrillation (HCC), Basal cell carcinoma, face, BPH (benign prostatic hyperplasia), Carotid artery stenosis (05/21/2006), CKD (chronic kidney disease), stage III (HCC), COPD (chronic obstructive pulmonary disease) (HCC), Coronary artery disease (08/14/1983), Diastolic dysfunction (02/08/2018), Diverticulosis, GERD (gastroesophageal reflux disease), Glaucoma, Heart murmur, History of kidney stones, Hyperlipidemia, Iliac artery aneurysm, bilateral (HCC) (07/03/2020), Myocardial infarction (HCC) (08/13/2016), and Postoperative deep vein thrombosis (DVT) (HCC).  PSH:    Past Surgical History:  Procedure Laterality Date   CATARACT EXTRACTION, BILATERAL  01/2019   CORONARY ANGIOPLASTY  08/14/1983   Procedure: CORONARY ANGIOPLASTY (PTCA pRCA); Location: [facility]; Surgeon: Al Little, MD   CYSTOSCOPY W/ RETROGRADES  08/12/2020   Procedure: CYSTOSCOPY WITH RETROGRADE PYELOGRAM;  Surgeon: Dustin Gimenez, MD;  Location: [facility];  Service: Urology;;   CYSTOSCOPY WITH INSERTION OF UROLIFT N/A 12/22/2021  Procedure: CYSTOSCOPY WITH INSERTION OF UROLIFT;  Surgeon: Dustin Gimenez, MD;  Location: [facility];  Service: Urology;  Laterality: N/A;    CYSTOSCOPY/URETEROSCOPY/HOLMIUM LASER/STENT PLACEMENT Left 08/12/2020   Procedure: CYSTOSCOPY/URETEROSCOPY/HOLMIUM LASER/STENT PLACEMENT;  Surgeon: Dustin Gimenez, MD;  Location: [facility];  Service: Urology;  Laterality: Left;   ENDOVASCULAR REPAIR/STENT GRAFT N/A 07/03/2020   Procedure: ENDOVASCULAR REPAIR/STENT GRAFT;  Surgeon: Jackquelyn Mass, MD;  Location: [facility];  Service: Cardiovascular;  Laterality: N/A;   ESOPHAGEAL BRUSHING  03/09/2023   Procedure: ESOPHAGEAL BRUSHING;  Surgeon: Selena Daily, MD;  Location: [facility];  Service: Gastroenterology;;   ESOPHAGOGASTRODUODENOSCOPY (EGD) WITH PROPOFOL  N/A 03/09/2023   Procedure: ESOPHAGOGASTRODUODENOSCOPY (EGD) WITH PROPOFOL ;  Surgeon: Selena Daily, MD;  Location: [facility];  Service: Gastroenterology;  Laterality: N/A;   PELVIC ANGIOGRAPHY Right 10/14/2021   Procedure: PELVIC ANGIOGRAPHY;  Surgeon: Jackquelyn Mass, MD;  Location: [facility];  Service: Cardiovascular;  Laterality: Right;   PERIPHERAL VASCULAR THROMBECTOMY Right 10/14/2021   Procedure: PERIPHERAL VASCULAR THROMBECTOMY;  Surgeon: Jackquelyn Mass, MD;  Location: [facility];  Service: Cardiovascular;  Laterality: Right;   ROTATOR CUFF REPAIR Left    TEMPORARY PACEMAKER  08/14/1983   Procedure: TEMPORARY PACEMAKER PLACEMENT; Location[facility]; Surgeon: Al Little, MD   TONSILLECTOMY     TOTAL HIP ARTHROPLASTY Left 02/21/2019   Procedure: TOTAL HIP ARTHROPLASTY ANTERIOR APPROACH;  Surgeon: Claiborne Crew, MD;  Location: [facility];  Service: Orthopedics;  Laterality: Left;  70 mins   TOTAL HIP ARTHROPLASTY Right 02/25/2023   Procedure: TOTAL HIP ARTHROPLASTY ANTERIOR APPROACH;  Surgeon: Claiborne Crew, MD;  Location: [facility];  Service: Orthopedics;  Laterality: Right;   VITRECTOMY Bilateral     Current Outpatient Medications  Medication Sig Dispense Refill   mirtazapine  (REMERON ) 15 MG tablet Take 1 tablet (15 mg  total) by mouth at bedtime. 30 tablet 2   nicotine  polacrilex (COMMIT) 2 MG lozenge Take 1 mg by mouth as needed for smoking cessation.     No current facility-administered medications for this visit.   Allergies:   Quinolones and Sulfa  antibiotics   Social History:  The patient  reports that he quit smoking about 21 years ago. His smoking use included cigarettes. He started smoking about 71 years ago. He has a 100 pack-year smoking history. He has never used smokeless tobacco. He reports that he does not currently use alcohol. He reports that he does not use drugs.   Family History:   family history includes AAA (abdominal aortic aneurysm) in his mother; Heart Problems in his mother.   Review of Systems: Review of Systems  Constitutional: Negative.   HENT: Negative.    Respiratory: Negative.    Cardiovascular: Negative.   Gastrointestinal: Negative.   Musculoskeletal: Negative.   Neurological: Negative.   Psychiatric/Behavioral: Negative.    All other systems reviewed and are negative.  PHYSICAL EXAM: VS:  BP (!) 130/56 (BP Location: Left Arm, Patient Position: Sitting, Cuff Size: Normal)   Pulse 72   Ht 5' 10.5" (1.791 m)   Wt 166 lb (75.3 kg)   SpO2 96%   BMI 23.48 kg/m  , BMI Body mass index is 23.48 kg/m. Constitutional:  oriented to person, place, and time. No distress.  HENT:  Head: Grossly normal Eyes:  no discharge. No scleral icterus.  Neck: No JVD, no carotid bruits  Cardiovascular: Regular rate and rhythm, no murmurs appreciated Pulmonary/Chest: Clear to auscultation bilaterally, no wheezes or rales Abdominal: Soft.  no distension.  no tenderness.  Musculoskeletal: Normal range of motion Neurological:  normal muscle tone. Coordination normal. No atrophy Skin: Skin warm and dry Psychiatric: normal affect, pleasant   Recent Labs: 03/08/2023: ALT 29 05/25/2023: Hemoglobin 11.4; Platelets 407 07/05/2023: BUN 11; Creatinine, Ser 1.24; Potassium 4.1; Sodium 141     Lipid Panel Lab Results  Component Value Date   CHOL 115 05/20/2022   HDL 36 (L) 05/20/2022   LDLCALC 60 05/20/2022   TRIG 96 05/20/2022      Wt Readings from Last 3 Encounters:  08/12/23 166 lb (75.3 kg)  07/28/23 166 lb 8 oz (75.5 kg)  05/25/23 164 lb (74.4 kg)     ASSESSMENT AND PLAN:  Problem List Items Addressed This Visit       Cardiology Problems   AAA (abdominal aortic aneurysm) without rupture (HCC) (Chronic)   Carotid stenosis (Chronic)   Paroxysmal atrial fibrillation (HCC) - Primary (Chronic)   Relevant Orders   EKG 12-Lead (Completed)   Other Visit Diagnoses       Coronary artery disease involving native coronary artery of native heart without angina pectoris       Relevant Orders   EKG 12-Lead (Completed)     PSVT (paroxysmal supraventricular tachycardia) (HCC)       Relevant Orders   EKG 12-Lead (Completed)     Syncope and collapse         PAD (peripheral artery disease) (HCC)         Aortic valve stenosis, etiology of cardiac valve disease unspecified       Relevant Orders   EKG 12-Lead (Completed)      AAA endovascular repair with Dr. Alferd Igo ultrasound October 2024 Reports that he stopped his aspirin  Plavix  statin  Aortic valve stenosis Moderate on echocardiogram 2022, no significant change in 2024 Low-grade murmur on exam, He reports he does not want any intervention, hospitalizations or procedures  PAD/carotid stenosis Mild to moderate disease noted 2014 40 to 59% stenosis on right in October 2024 Occlusion on left, asymptomatic Reports he stopped his cholesterol medications, stopped aspirin  Plavix  He is having periodic vision issues, unable to exclude TIA  Hyperlipidemia Reports that he stopped all of his medications  CAD with stable angina Prior PTCA 1980s, no intervention since that time Denies chest pain concerning for angina, he does report having some shortness of breath ON exertion, most likely  deconditioning Not a good candidate for ischemic workup given medication noncompliance and his desire not to have procedures performed, not even hospitalization  Dilated aorta Not grossly dilated on echocardiogram in 2024    Signed, Juanda Noon, M.D., Ph.D. [facility] [place], [place] [phone]

## 2023-08-11 NOTE — Telephone Encounter (Signed)
 This patient is currently on palliative care.  I received a hospice consultation from the palliative care nurse with [facility].  I also spoke to the patient and his son.  Patient's son relates that he had 2 events in the last 2 weeks where he lost his sight and concern for additional stroke.  Patient does not wish to be taken to the hospital anymore.  In brief, hospice nurse does note that the patient cannot drive, syncopal events, dizziness, falls, sleeping more, 2 episodes of lost vision as above, weakening.  Patient has stopped all medication.  He has had a 20 pound weight loss in the last year.  After discussion with the patient, family, it sounds as if he has taken a turn for the worse in the last 2 weeks.  I have called hospice and asked them to transition from palliative care to hospice consultation.

## 2023-08-12 ENCOUNTER — Encounter: Payer: Self-pay | Admitting: Cardiovascular Disease

## 2023-08-12 ENCOUNTER — Ambulatory Visit: Attending: Cardiovascular Disease | Admitting: Cardiovascular Disease

## 2023-08-12 VITALS — BP 130/56 | HR 72 | Ht 70.5 in | Wt 166.0 lb

## 2023-08-12 DIAGNOSIS — I471 Supraventricular tachycardia, unspecified: Secondary | ICD-10-CM | POA: Diagnosis not present

## 2023-08-12 DIAGNOSIS — I480 Paroxysmal atrial fibrillation: Secondary | ICD-10-CM

## 2023-08-12 DIAGNOSIS — I2510 Atherosclerotic heart disease of native coronary artery without angina pectoris: Secondary | ICD-10-CM | POA: Diagnosis not present

## 2023-08-12 DIAGNOSIS — I350 Nonrheumatic aortic (valve) stenosis: Secondary | ICD-10-CM | POA: Diagnosis not present

## 2023-08-12 DIAGNOSIS — R55 Syncope and collapse: Secondary | ICD-10-CM

## 2023-08-12 DIAGNOSIS — I739 Peripheral vascular disease, unspecified: Secondary | ICD-10-CM | POA: Diagnosis not present

## 2023-08-12 DIAGNOSIS — I6523 Occlusion and stenosis of bilateral carotid arteries: Secondary | ICD-10-CM

## 2023-08-12 DIAGNOSIS — I714 Abdominal aortic aneurysm, without rupture, unspecified: Secondary | ICD-10-CM | POA: Diagnosis not present

## 2023-08-12 NOTE — Patient Instructions (Addendum)
 Dr. Lesly Raspberry [phone] [address], [place], [place] [address]    Medication Instructions:  No changes  If you need a refill on your cardiac medications before your next appointment, please call your pharmacy.   Lab work: No new labs needed  Testing/Procedures: No new testing needed  Follow-Up: At [facility], you and your health needs are our priority.  As part of our continuing mission to provide you with exceptional heart care, we have created designated Provider Care Teams.  These Care Teams include your primary Cardiologist (physician) and Advanced Practice Providers (APPs -  Physician Assistants and Nurse Practitioners) who all work together to provide you with the care you need, when you need it.  You will need a follow up appointment in 12 months  Providers on your designated Care Team:   Laneta Pintos, NP Varney Gentleman, PA-C Cadence Gennaro Khat, [place]  COVID-19 Vaccine Information can be found at: [email or web address] For questions related to vaccine distribution or appointments, please email [email or web address] or call 4197440227.

## 2023-08-31 ENCOUNTER — Ambulatory Visit: Admitting: Clinical

## 2023-09-15 ENCOUNTER — Ambulatory Visit: Admitting: Family Medicine

## 2024-02-04 ENCOUNTER — Telehealth: Payer: Self-pay | Admitting: *Deleted

## 2024-02-04 NOTE — Telephone Encounter (Signed)
 Copied from CRM [id number]. Topic: General - Other >> Feb 04, 2024  4:22 PM Alfonso HERO wrote: Reason for CRM: Dr Nancyann Newcomer called to info that the patient has been evaluated to continue hospice service and no changes, prognosis is  greater than 6 months. If you have any questions please call [phone].

## 2024-02-08 ENCOUNTER — Ambulatory Visit: Payer: Medicare HMO

## 2024-02-13 NOTE — Progress Notes (Signed)
     Chinonso Linker T. Loucinda Croy, MD, CAQ Sports Medicine [facility] at [facility] [address] [place], [address]  Phone: [phone]  FAX: [phone]  VRISHANK MOSTER - 83 y.o. male  MRN [id number]  Date of Birth: [birth year]  Date: 02/14/2024  PCP: Watt Mirza, MD  Referral: Watt Mirza, MD  No chief complaint on file.  Subjective:   [name] is a 83 y.o. very pleasant male patient with There is no height or weight on file to calculate BMI. who presents with the following:  Discussed the use of AI scribe software for clinical note transcription with the patient, who gave verbal consent to proceed.  This is a fairly new patient to me who I saw in May 2025 for the first time, and at that point he wanted to stop medication management and go on hospice.  Hospice has released the patient.  History is significant for A-fib, history of stroke, COPD, heart failure, coronary disease, stage III chronic kidney disease, aortic stenosis, hyperlipidemia, abdominal aortic aneurysm, peripheral vascular disease, vision loss of the left eye.  History of MI.  We previously went over goals of care, and he would like to remain DNR/DNI. History of Present Illness     Review of Systems is noted in the HPI, as appropriate  Objective:   There were no vitals taken for this visit.  GEN: No acute distress; alert,appropriate. PULM: Breathing comfortably in no respiratory distress PSYCH: Normally interactive.   Laboratory and Imaging Data:  Assessment and Plan:   No diagnosis found. Assessment & Plan   Medication Management during today's office visit: No orders of the defined types were placed in this encounter.  There are no discontinued medications.  Orders placed today for conditions managed today: No orders of the defined types were placed in this encounter.   Disposition: No follow-ups on file.  [employer] speech-to-text software was  used for transcription in this dictation.  Possible transcriptional errors can occur using Animal nutritionist.   Signed,  Mirza DASEN. Selig Wampole, MD   Outpatient Encounter Medications as of 02/14/2024  Medication Sig   mirtazapine  (REMERON ) 15 MG tablet Take 1 tablet (15 mg total) by mouth at bedtime.   nicotine  polacrilex (COMMIT) 2 MG lozenge Take 1 mg by mouth as needed for smoking cessation.   No facility-administered encounter medications on file as of 02/14/2024.

## 2024-02-14 ENCOUNTER — Encounter: Payer: Self-pay | Admitting: Family Medicine

## 2024-02-14 ENCOUNTER — Ambulatory Visit: Payer: Self-pay | Admitting: Family Medicine

## 2024-02-14 ENCOUNTER — Ambulatory Visit: Admitting: Family Medicine

## 2024-02-14 VITALS — BP 120/70 | HR 90 | Temp 98.7°F | Ht 70.25 in | Wt 163.5 lb

## 2024-02-14 DIAGNOSIS — I5032 Chronic diastolic (congestive) heart failure: Secondary | ICD-10-CM

## 2024-02-14 DIAGNOSIS — Z7189 Other specified counseling: Secondary | ICD-10-CM | POA: Diagnosis not present

## 2024-02-14 DIAGNOSIS — N1831 Chronic kidney disease, stage 3a: Secondary | ICD-10-CM

## 2024-02-14 DIAGNOSIS — Z9861 Coronary angioplasty status: Secondary | ICD-10-CM

## 2024-02-14 DIAGNOSIS — Z23 Encounter for immunization: Secondary | ICD-10-CM | POA: Diagnosis not present

## 2024-02-14 DIAGNOSIS — E782 Mixed hyperlipidemia: Secondary | ICD-10-CM | POA: Diagnosis not present

## 2024-02-14 DIAGNOSIS — I2510 Atherosclerotic heart disease of native coronary artery without angina pectoris: Secondary | ICD-10-CM

## 2024-02-14 DIAGNOSIS — R739 Hyperglycemia, unspecified: Secondary | ICD-10-CM | POA: Diagnosis not present

## 2024-02-14 DIAGNOSIS — I252 Old myocardial infarction: Secondary | ICD-10-CM | POA: Diagnosis not present

## 2024-02-14 DIAGNOSIS — Z8673 Personal history of transient ischemic attack (TIA), and cerebral infarction without residual deficits: Secondary | ICD-10-CM

## 2024-02-14 DIAGNOSIS — J431 Panlobular emphysema: Secondary | ICD-10-CM | POA: Diagnosis not present

## 2024-02-14 DIAGNOSIS — I480 Paroxysmal atrial fibrillation: Secondary | ICD-10-CM | POA: Diagnosis not present

## 2024-02-14 LAB — BASIC METABOLIC PANEL WITH GFR
BUN: 19 mg/dL (ref 6–23)
CO2: 33 meq/L — ABNORMAL HIGH (ref 19–32)
Calcium: 9.2 mg/dL (ref 8.4–10.5)
Chloride: 102 meq/L (ref 96–112)
Creatinine, Ser: 1.35 mg/dL (ref 0.40–1.50)
GFR: 48.81 mL/min — ABNORMAL LOW (ref >60.00)
Glucose, Bld: 120 mg/dL — ABNORMAL HIGH (ref 70–99)
Potassium: 4.5 meq/L (ref 3.5–5.1)
Sodium: 139 meq/L (ref 135–145)

## 2024-02-14 LAB — LIPID PANEL
Cholesterol: 183 mg/dL (ref 0–200)
HDL: 36.6 mg/dL — ABNORMAL LOW (ref >39.00)
LDL Cholesterol: 118 mg/dL — ABNORMAL HIGH (ref 0–99)
NonHDL: 146.04
Total CHOL/HDL Ratio: 5
Triglycerides: 139 mg/dL (ref 0.0–149.0)
VLDL: 27.8 mg/dL (ref 0.0–40.0)

## 2024-02-14 LAB — CBC WITH DIFFERENTIAL/PLATELET
Basophils Absolute: 0.1 K/uL (ref 0.0–0.1)
Basophils Relative: 0.9 % (ref 0.0–3.0)
Eosinophils Absolute: 0.1 K/uL (ref 0.0–0.7)
Eosinophils Relative: 1.9 % (ref 0.0–5.0)
HCT: 43.4 % (ref 39.0–52.0)
Hemoglobin: 14.3 g/dL (ref 13.0–17.0)
Lymphocytes Relative: 18.4 % (ref 12.0–46.0)
Lymphs Abs: 1.3 K/uL (ref 0.7–4.0)
MCHC: 33 g/dL (ref 30.0–36.0)
MCV: 93.5 fl (ref 78.0–100.0)
Monocytes Absolute: 1.1 K/uL — ABNORMAL HIGH (ref 0.1–1.0)
Monocytes Relative: 16.1 % — ABNORMAL HIGH (ref 3.0–12.0)
Neutro Abs: 4.3 K/uL (ref 1.4–7.7)
Neutrophils Relative %: 62.7 % (ref 43.0–77.0)
Platelets: 261 K/uL (ref 150.0–400.0)
RBC: 4.64 Mil/uL (ref 4.22–5.81)
RDW: 13.9 % (ref 11.5–15.5)
WBC: 6.9 K/uL (ref 4.0–10.5)

## 2024-02-14 LAB — HEPATIC FUNCTION PANEL
ALT: 13 U/L (ref 0–53)
AST: 19 U/L (ref 0–37)
Albumin: 3.8 g/dL (ref 3.5–5.2)
Alkaline Phosphatase: 100 U/L (ref 39–117)
Bilirubin, Direct: 0.1 mg/dL (ref 0.0–0.3)
Total Bilirubin: 0.5 mg/dL (ref 0.2–1.2)
Total Protein: 7.2 g/dL (ref 6.0–8.3)

## 2024-02-14 LAB — HEMOGLOBIN A1C: Hgb A1c MFr Bld: 5.7 % (ref 4.6–6.5)

## 2024-02-14 MED ORDER — MIRTAZAPINE 15 MG PO TABS: 15.0000 mg | ORAL_TABLET | Freq: Every day | ORAL | 3 refills | Status: AC

## 2024-02-23 ENCOUNTER — Telehealth: Payer: Self-pay | Admitting: Family Medicine

## 2024-02-23 NOTE — Telephone Encounter (Signed)
 Spoke with Garrel (son).  He states Hospice and he thought Dr. Okey had also diagnosed his dad with Dementia.  I filled out what I could.

## 2024-02-23 NOTE — Telephone Encounter (Signed)
 Completed forms faxed to [facility] at [phone].

## 2024-02-23 NOTE — Telephone Encounter (Signed)
 done

## 2024-02-23 NOTE — Telephone Encounter (Signed)
 Copied from CRM #[id number]. Topic: General - Other >> Feb 23, 2024  8:51 AM Franky GRADE wrote: Reason for CRM: Patient son Garrel is calling to inform that [facility] will be faxing over documentation for Dr.Copland to fill out.

## 2024-03-07 ENCOUNTER — Telehealth: Payer: Self-pay | Admitting: Family Medicine

## 2024-03-07 NOTE — Telephone Encounter (Signed)
 Patient son wanted Dr. Watt to fill out a DO NOT RESUCUTATE form .  I left the form in his folder upfront.

## 2024-03-07 NOTE — Telephone Encounter (Signed)
 DNR form placed in Dr. Eleanore office in box to complete.

## 2024-03-08 NOTE — Telephone Encounter (Signed)
 Left message for Garrel that his DNR form is ready to be picked up at the front desk.

## 2024-03-09 NOTE — Telephone Encounter (Signed)
 Picked up form at front desk

## 2024-05-17 ENCOUNTER — Ambulatory Visit: Admitting: Family Medicine
